# Patient Record
Sex: Male | Born: 1945 | Race: White | Hispanic: No | Smoking: Light tobacco smoker
Health system: Southern US, Community
[De-identification: ages and names within clinical notes are randomized; demographics above are authoritative.]

## PROBLEM LIST (undated history)

## (undated) DIAGNOSIS — N400 Enlarged prostate without lower urinary tract symptoms: Secondary | ICD-10-CM

## (undated) DIAGNOSIS — H5440 Blindness, one eye, unspecified eye: Secondary | ICD-10-CM

## (undated) DIAGNOSIS — E1165 Type 2 diabetes mellitus with hyperglycemia: Secondary | ICD-10-CM

## (undated) DIAGNOSIS — Z720 Tobacco use: Secondary | ICD-10-CM

## (undated) DIAGNOSIS — I10 Essential (primary) hypertension: Secondary | ICD-10-CM

## (undated) DIAGNOSIS — E119 Type 2 diabetes mellitus without complications: Secondary | ICD-10-CM

## (undated) DIAGNOSIS — I252 Old myocardial infarction: Secondary | ICD-10-CM

## (undated) DIAGNOSIS — E042 Nontoxic multinodular goiter: Secondary | ICD-10-CM

## (undated) HISTORY — DX: Tobacco use: Z72.0

## (undated) HISTORY — DX: Type 2 diabetes mellitus without complications: E11.9

## (undated) HISTORY — DX: Type 2 diabetes mellitus with hyperglycemia: E11.65

## (undated) HISTORY — PX: CORONARY ANGIOPLASTY WITH STENT PLACEMENT: SHX49

## (undated) HISTORY — DX: Nontoxic multinodular goiter: E04.2

## (undated) HISTORY — DX: Blindness, one eye, unspecified eye: H54.40

## (undated) HISTORY — DX: Essential (primary) hypertension: I10

## (undated) HISTORY — DX: Old myocardial infarction: I25.2

## (undated) HISTORY — DX: Benign prostatic hyperplasia without lower urinary tract symptoms: N40.0

---

## 2012-01-13 DIAGNOSIS — R972 Elevated prostate specific antigen [PSA]: Secondary | ICD-10-CM | POA: Diagnosis not present

## 2012-01-20 DIAGNOSIS — R972 Elevated prostate specific antigen [PSA]: Secondary | ICD-10-CM | POA: Diagnosis not present

## 2012-01-22 DIAGNOSIS — N400 Enlarged prostate without lower urinary tract symptoms: Secondary | ICD-10-CM | POA: Diagnosis not present

## 2012-01-22 DIAGNOSIS — R972 Elevated prostate specific antigen [PSA]: Secondary | ICD-10-CM | POA: Diagnosis not present

## 2012-02-03 DIAGNOSIS — R972 Elevated prostate specific antigen [PSA]: Secondary | ICD-10-CM | POA: Diagnosis not present

## 2012-02-03 DIAGNOSIS — N429 Disorder of prostate, unspecified: Secondary | ICD-10-CM | POA: Diagnosis not present

## 2012-02-05 DIAGNOSIS — H5200 Hypermetropia, unspecified eye: Secondary | ICD-10-CM | POA: Diagnosis not present

## 2012-02-05 DIAGNOSIS — E119 Type 2 diabetes mellitus without complications: Secondary | ICD-10-CM | POA: Diagnosis not present

## 2012-02-05 DIAGNOSIS — H04129 Dry eye syndrome of unspecified lacrimal gland: Secondary | ICD-10-CM | POA: Diagnosis not present

## 2012-02-05 DIAGNOSIS — H26139 Total traumatic cataract, unspecified eye: Secondary | ICD-10-CM | POA: Diagnosis not present

## 2012-02-26 DIAGNOSIS — E119 Type 2 diabetes mellitus without complications: Secondary | ICD-10-CM | POA: Diagnosis not present

## 2012-02-26 DIAGNOSIS — E559 Vitamin D deficiency, unspecified: Secondary | ICD-10-CM | POA: Diagnosis not present

## 2012-03-03 DIAGNOSIS — E119 Type 2 diabetes mellitus without complications: Secondary | ICD-10-CM | POA: Diagnosis not present

## 2012-03-03 DIAGNOSIS — R972 Elevated prostate specific antigen [PSA]: Secondary | ICD-10-CM | POA: Diagnosis not present

## 2012-03-03 DIAGNOSIS — G609 Hereditary and idiopathic neuropathy, unspecified: Secondary | ICD-10-CM | POA: Diagnosis not present

## 2012-04-28 DIAGNOSIS — R972 Elevated prostate specific antigen [PSA]: Secondary | ICD-10-CM | POA: Diagnosis not present

## 2012-05-05 DIAGNOSIS — R972 Elevated prostate specific antigen [PSA]: Secondary | ICD-10-CM | POA: Diagnosis not present

## 2012-07-29 DIAGNOSIS — R972 Elevated prostate specific antigen [PSA]: Secondary | ICD-10-CM | POA: Diagnosis not present

## 2012-08-25 DIAGNOSIS — R972 Elevated prostate specific antigen [PSA]: Secondary | ICD-10-CM | POA: Diagnosis not present

## 2012-09-29 DIAGNOSIS — Z23 Encounter for immunization: Secondary | ICD-10-CM | POA: Diagnosis not present

## 2013-01-12 DIAGNOSIS — Z79899 Other long term (current) drug therapy: Secondary | ICD-10-CM | POA: Diagnosis not present

## 2013-01-12 DIAGNOSIS — M25519 Pain in unspecified shoulder: Secondary | ICD-10-CM | POA: Diagnosis not present

## 2013-01-12 DIAGNOSIS — E119 Type 2 diabetes mellitus without complications: Secondary | ICD-10-CM | POA: Diagnosis not present

## 2013-06-30 DIAGNOSIS — L821 Other seborrheic keratosis: Secondary | ICD-10-CM | POA: Diagnosis not present

## 2013-07-12 DIAGNOSIS — G609 Hereditary and idiopathic neuropathy, unspecified: Secondary | ICD-10-CM | POA: Diagnosis not present

## 2013-07-12 DIAGNOSIS — Z79899 Other long term (current) drug therapy: Secondary | ICD-10-CM | POA: Diagnosis not present

## 2013-07-12 DIAGNOSIS — J309 Allergic rhinitis, unspecified: Secondary | ICD-10-CM | POA: Diagnosis not present

## 2013-07-12 DIAGNOSIS — IMO0001 Reserved for inherently not codable concepts without codable children: Secondary | ICD-10-CM | POA: Diagnosis not present

## 2013-07-12 DIAGNOSIS — M25519 Pain in unspecified shoulder: Secondary | ICD-10-CM | POA: Diagnosis not present

## 2013-08-16 DIAGNOSIS — R972 Elevated prostate specific antigen [PSA]: Secondary | ICD-10-CM | POA: Diagnosis not present

## 2013-08-20 DIAGNOSIS — H524 Presbyopia: Secondary | ICD-10-CM | POA: Diagnosis not present

## 2013-08-20 DIAGNOSIS — E119 Type 2 diabetes mellitus without complications: Secondary | ICD-10-CM | POA: Diagnosis not present

## 2013-08-20 DIAGNOSIS — H2510 Age-related nuclear cataract, unspecified eye: Secondary | ICD-10-CM | POA: Diagnosis not present

## 2013-08-20 DIAGNOSIS — H547 Unspecified visual loss: Secondary | ICD-10-CM | POA: Diagnosis not present

## 2013-10-04 DIAGNOSIS — Z23 Encounter for immunization: Secondary | ICD-10-CM | POA: Diagnosis not present

## 2013-12-06 DIAGNOSIS — R52 Pain, unspecified: Secondary | ICD-10-CM | POA: Diagnosis not present

## 2013-12-06 DIAGNOSIS — J019 Acute sinusitis, unspecified: Secondary | ICD-10-CM | POA: Diagnosis not present

## 2013-12-06 DIAGNOSIS — J209 Acute bronchitis, unspecified: Secondary | ICD-10-CM | POA: Diagnosis not present

## 2014-01-12 DIAGNOSIS — IMO0001 Reserved for inherently not codable concepts without codable children: Secondary | ICD-10-CM | POA: Diagnosis not present

## 2014-01-12 DIAGNOSIS — N400 Enlarged prostate without lower urinary tract symptoms: Secondary | ICD-10-CM | POA: Diagnosis not present

## 2014-01-12 DIAGNOSIS — L989 Disorder of the skin and subcutaneous tissue, unspecified: Secondary | ICD-10-CM | POA: Diagnosis not present

## 2014-01-12 DIAGNOSIS — Z79899 Other long term (current) drug therapy: Secondary | ICD-10-CM | POA: Diagnosis not present

## 2014-01-12 DIAGNOSIS — R238 Other skin changes: Secondary | ICD-10-CM | POA: Diagnosis not present

## 2014-02-07 DIAGNOSIS — R972 Elevated prostate specific antigen [PSA]: Secondary | ICD-10-CM | POA: Diagnosis not present

## 2014-02-14 DIAGNOSIS — N139 Obstructive and reflux uropathy, unspecified: Secondary | ICD-10-CM | POA: Diagnosis not present

## 2014-02-14 DIAGNOSIS — R972 Elevated prostate specific antigen [PSA]: Secondary | ICD-10-CM | POA: Diagnosis not present

## 2014-02-14 DIAGNOSIS — N401 Enlarged prostate with lower urinary tract symptoms: Secondary | ICD-10-CM | POA: Diagnosis not present

## 2014-08-10 DIAGNOSIS — R972 Elevated prostate specific antigen [PSA]: Secondary | ICD-10-CM | POA: Diagnosis not present

## 2014-08-18 DIAGNOSIS — Z23 Encounter for immunization: Secondary | ICD-10-CM | POA: Diagnosis not present

## 2014-08-18 DIAGNOSIS — R972 Elevated prostate specific antigen [PSA]: Secondary | ICD-10-CM | POA: Diagnosis not present

## 2014-08-18 DIAGNOSIS — R5383 Other fatigue: Secondary | ICD-10-CM | POA: Diagnosis not present

## 2014-08-18 DIAGNOSIS — Z136 Encounter for screening for cardiovascular disorders: Secondary | ICD-10-CM | POA: Diagnosis not present

## 2014-08-18 DIAGNOSIS — IMO0001 Reserved for inherently not codable concepts without codable children: Secondary | ICD-10-CM | POA: Diagnosis not present

## 2014-08-18 DIAGNOSIS — R5381 Other malaise: Secondary | ICD-10-CM | POA: Diagnosis not present

## 2014-08-18 DIAGNOSIS — Z0184 Encounter for antibody response examination: Secondary | ICD-10-CM | POA: Diagnosis not present

## 2014-08-23 ENCOUNTER — Other Ambulatory Visit: Payer: Self-pay | Admitting: Family Medicine

## 2014-08-23 DIAGNOSIS — E1165 Type 2 diabetes mellitus with hyperglycemia: Principal | ICD-10-CM

## 2014-08-23 DIAGNOSIS — IMO0001 Reserved for inherently not codable concepts without codable children: Secondary | ICD-10-CM

## 2014-08-23 DIAGNOSIS — Z139 Encounter for screening, unspecified: Secondary | ICD-10-CM

## 2014-09-02 ENCOUNTER — Ambulatory Visit
Admission: RE | Admit: 2014-09-02 | Discharge: 2014-09-02 | Disposition: A | Discharge status: Home / Self Care | Payer: Medicare Other | Source: Ambulatory Visit | Attending: Family Medicine | Admitting: Family Medicine

## 2014-09-02 ENCOUNTER — Encounter (INDEPENDENT_AMBULATORY_CARE_PROVIDER_SITE_OTHER): Payer: Self-pay

## 2014-09-02 DIAGNOSIS — IMO0001 Reserved for inherently not codable concepts without codable children: Secondary | ICD-10-CM

## 2014-09-02 DIAGNOSIS — Z136 Encounter for screening for cardiovascular disorders: Secondary | ICD-10-CM | POA: Diagnosis not present

## 2014-09-02 DIAGNOSIS — E119 Type 2 diabetes mellitus without complications: Secondary | ICD-10-CM | POA: Diagnosis not present

## 2014-09-02 DIAGNOSIS — E1165 Type 2 diabetes mellitus with hyperglycemia: Principal | ICD-10-CM

## 2014-09-02 DIAGNOSIS — I7090 Unspecified atherosclerosis: Secondary | ICD-10-CM | POA: Diagnosis not present

## 2014-09-02 DIAGNOSIS — Z139 Encounter for screening, unspecified: Secondary | ICD-10-CM

## 2014-09-02 DIAGNOSIS — Z87891 Personal history of nicotine dependence: Secondary | ICD-10-CM | POA: Diagnosis not present

## 2014-10-03 DIAGNOSIS — H2513 Age-related nuclear cataract, bilateral: Secondary | ICD-10-CM | POA: Diagnosis not present

## 2014-10-03 DIAGNOSIS — E119 Type 2 diabetes mellitus without complications: Secondary | ICD-10-CM | POA: Diagnosis not present

## 2015-02-13 DIAGNOSIS — E1165 Type 2 diabetes mellitus with hyperglycemia: Secondary | ICD-10-CM | POA: Diagnosis not present

## 2015-02-13 DIAGNOSIS — N400 Enlarged prostate without lower urinary tract symptoms: Secondary | ICD-10-CM | POA: Diagnosis not present

## 2015-02-20 DIAGNOSIS — R972 Elevated prostate specific antigen [PSA]: Secondary | ICD-10-CM | POA: Diagnosis not present

## 2015-02-20 DIAGNOSIS — N401 Enlarged prostate with lower urinary tract symptoms: Secondary | ICD-10-CM | POA: Diagnosis not present

## 2015-02-20 DIAGNOSIS — R351 Nocturia: Secondary | ICD-10-CM | POA: Diagnosis not present

## 2015-02-20 DIAGNOSIS — E1165 Type 2 diabetes mellitus with hyperglycemia: Secondary | ICD-10-CM | POA: Diagnosis not present

## 2015-02-20 DIAGNOSIS — Z23 Encounter for immunization: Secondary | ICD-10-CM | POA: Diagnosis not present

## 2015-02-20 DIAGNOSIS — N400 Enlarged prostate without lower urinary tract symptoms: Secondary | ICD-10-CM | POA: Diagnosis not present

## 2015-03-23 DIAGNOSIS — E1165 Type 2 diabetes mellitus with hyperglycemia: Secondary | ICD-10-CM | POA: Diagnosis not present

## 2015-04-03 ENCOUNTER — Ambulatory Visit: Payer: Medicare Other | Admitting: Endocrinology

## 2015-04-12 ENCOUNTER — Ambulatory Visit (INDEPENDENT_AMBULATORY_CARE_PROVIDER_SITE_OTHER): Payer: Medicare Other | Admitting: Endocrinology

## 2015-04-12 ENCOUNTER — Encounter: Payer: Self-pay | Admitting: Endocrinology

## 2015-04-12 ENCOUNTER — Other Ambulatory Visit: Payer: Self-pay | Admitting: *Deleted

## 2015-04-12 VITALS — BP 118/68 | HR 74 | Temp 98.1°F | Resp 14 | Ht 72.0 in | Wt 182.0 lb

## 2015-04-12 DIAGNOSIS — H5440 Blindness, one eye, unspecified eye: Secondary | ICD-10-CM

## 2015-04-12 DIAGNOSIS — E1165 Type 2 diabetes mellitus with hyperglycemia: Secondary | ICD-10-CM

## 2015-04-12 DIAGNOSIS — IMO0002 Reserved for concepts with insufficient information to code with codable children: Secondary | ICD-10-CM

## 2015-04-12 DIAGNOSIS — E042 Nontoxic multinodular goiter: Secondary | ICD-10-CM | POA: Diagnosis not present

## 2015-04-12 DIAGNOSIS — H5442 Blindness, left eye, normal vision right eye: Secondary | ICD-10-CM | POA: Diagnosis not present

## 2015-04-12 DIAGNOSIS — N400 Enlarged prostate without lower urinary tract symptoms: Secondary | ICD-10-CM | POA: Diagnosis not present

## 2015-04-12 HISTORY — DX: Nontoxic multinodular goiter: E04.2

## 2015-04-12 HISTORY — DX: Reserved for concepts with insufficient information to code with codable children: IMO0002

## 2015-04-12 HISTORY — DX: Blindness, one eye, unspecified eye: H54.40

## 2015-04-12 HISTORY — DX: Type 2 diabetes mellitus with hyperglycemia: E11.65

## 2015-04-12 MED ORDER — METFORMIN HCL ER 500 MG PO TB24: ORAL_TABLET | ORAL | Status: DC

## 2015-04-12 MED ORDER — DULAGLUTIDE 0.75 MG/0.5ML ~~LOC~~ SOAJ: SUBCUTANEOUS | Status: DC

## 2015-04-12 NOTE — Patient Instructions (Addendum)
Please check blood sugars at least half the time about 2 hours after any meal and 3 times per week on waking up.  Please bring blood sugar monitor to each visit.  Recommended blood sugar levels about 2 hours after meal is 140-180 and on waking up 83-254  Start TRULICITYwith the pen as shown once weekly on the same day of the week.  You may inject in the stomach, thigh or arm as indicated in the brochure given.  You will feel fullness of the stomach with starting the medication and should try to keep the portions at meals small.  You may experience nausea in the first few days which usually gets better over time   If any questions or concerns are present call the office or the  [place] at [phone]. Also visit [email or web address] website for more useful information  Glipizide 2x daily before meals

## 2015-04-12 NOTE — Progress Notes (Signed)
Patient ID: Alan Lopez, male   DOB: [birth year], 69 y.o.   MRN: [id number]           Reason for Appointment: Consultation for Type 2 Diabetes  Referring physician: Fulp  History of Present Illness:          Date of diagnosis of type 2 diabetes mellitus :  1999      Past history:  he had modestly increase blood sugars at diagnosis and was started on metformin Subsequently glipizide was added to improve his control. He thinks that his blood sugars have been mostly poorly controlled over the last several years Review of his A1c shows that it has ranged from 9-9.5 since 07/2013  Recent history:  He has been on glipizide and metformin for years without any adjustment of his dosage of glipizide Also until recently has not tried any other medications On his last visit with his PCP in March he was given a sample of Jardiance 10 mg which he took for about 5 weeks He thinks his sugars may have been a little better but since he does not monitor very often he cannot be sure if it helped   He is checking his blood sugars mostly in the mornings and these are consistently high Glucose levels in the lab this year have been 280 and 226 He is now here for further management  Oral hypoglycemic drugs the patient is taking are: metformin 500 mg, 2 tablets twice a day, glipizide 5 mg in the morning     Side effects from medications have been: Mild  diarrhea from metformin  Compliance with the medical regimen:fair  Hypoglycemia: never   Glucose monitoring:  done less than once a day         Glucometer:     Contour?  Blood Glucose readings by time of day and averages from recall  PREMEAL Breakfast Lunch Dinner Bedtime  Overall   Glucose range: 175-201  ?   ?  200+    Median:         Self-care: The diet that the patient has been following is: tries to limit high carbohydrate foods. eating out about 4 times a week andsometimes fast food.     Typical meal intake: Breakfast is recently a protein  drink.  Sometimes will have fried food Usually avoiding drinks with sugar               Dietician visit, most recent:never               Exercise: gardeningup to 3-4 hours daily   Weight history:178-215  Wt Readings from Last 3 Encounters:  04/12/15 182 lb (82.555 kg)    Glycemic control: last A1c 9.2 done on 02/13/15    No results found for: HGBA1C No results found for: GLUF, MICROALBUR, [place], CREATININE       Medication List       This list is accurate as of: 04/12/15  5:07 PM.  Always use your most recent med list.               cholecalciferol 1000 UNITS tablet  Commonly known as:  VITAMIN D  Take 1,000 Units by mouth daily.     Dulaglutide 0.75 MG/0.5ML Sopn  Commonly known as:  TRULICITY  Inject contents of one pen once a week     finasteride 5 MG tablet  Commonly known as:  PROSCAR  Take 5 mg by mouth daily.     glipiZIDE 5 MG  tablet  Commonly known as:  GLUCOTROL  Take 5 mg by mouth daily.     ibuprofen 200 MG tablet  Commonly known as:  ADVIL,MOTRIN  Take 200 mg by mouth every 6 (six) hours as needed.     metFORMIN 500 MG 24 hr tablet  Commonly known as:  GLUCOPHAGE XR  Take 2 tablets in the am and 2 tablets in the pm     multivitamin with minerals Tabs tablet  Take 1 tablet by mouth daily.        Allergies:  Allergies  Allergen Reactions  . Codeine Other (See Comments)    Patient doesn't like how it makes him feel    No past medical history on file.  No past surgical history on file.  Family History  Problem Relation Age of Onset  . Diabetes Mother   . Diabetes Brother   . Cancer Brother   . Heart disease Neg Hx     Social History:  reports that he has been smoking.  He has never used smokeless tobacco. His alcohol and drug histories are not on file.    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   ENT: no nasal congestion, difficulty swallowing, no hoarseness   Cardiovascular: no chest pain or tightness on exertion.  No leg swelling.  Hypertension:Not present  Respiratory: no cough/shortness of breath  Gastrointestinal: no constipation, diarrhea, nausea or abdominal pain  Musculoskeletal: no muscle/joint aches   Urological:  Has mild frequency of urination.  Has nocturia 3 times    Skin: no rash or infections  Neurological: no headaches.  Has history of numbness and some  tingling in feet but these symptoms are intermittent and mild    Psychiatric: no symptoms of depression  Endocrine: No unusual fatigue.  Previous TSH normal in 2015, has no  history of thyroid disease   LABS:  No results found for any previous visit.  Physical Examination:  BP 118/68 mmHg  Pulse 74  Temp(Src) 98.1 F (36.7 C)  Resp 14  Ht 6' (1.829 m)  Wt 182 lb (82.555 kg)  BMI 24.68 kg/m2  SpO2 95%  GENERAL:  well-built and nourished, nonobese    HEENT:         Eye exam shows dense left lenticular opacity.  right eye appears normal.  Fundus exam shows no retinopathy.  Oral exam shows normal mucosa .  NECK:         General:  Neck exam shows no lymphadenopathy. Carotids are normal to palpation and no bruit heard.  Thyroid is palpable about twice normal on the right side, firm and nodular.  About a 2 cm nodule felt on swallowing on the left side laterally LUNGS:         Chest is symmetrical. Lungs are clear to auscultation.Marland Kitchen   HEART:         Heart sounds:  S1 and S2 are normal. No murmurs or clicks heard., no S3 or S4.   ABDOMEN:   There is no distention present. Liver and spleen are not palpable. No other mass or tenderness present.  EXTREMITIES:     There is no edema. No skin lesions  present.Marland Kitchen  NEUROLOGICAL:   Vibration sense is moderately reduced in toes. Ankle jerks are absent bilaterally.          Diabetic foot exam shows normal monofilament sensation in the toes and plantar surfaces, no skin lesions or ulcers on the feet and normal pedal pulses MUSCULOSKELETAL:  There is no swelling or deformity of the peripheral joints. Spine is normal to inspection.   SKIN:       No rash or lesions of concern.        ASSESSMENT:  Diabetes type 2, uncontrolled  He is not significantly obese but he has persistently high blood sugars for the last few years with A1c consistently over 9% He is taking only 5 mg of glipizide a day with 2 g of metformin He does not think he had significant benefit from taking Jardiance recently Does need to improve his A1c by at least 2% and will need a relatively potent drug Also discussed that he probably has some insulin deficiency and will sooner orlater require insulin especially since he is nonobese  For now he probably will benefit from trial of a GLP-1 drug Discussed with the patient the nature of GLP-1 drugs, the action on various organ systems, how they benefit blood glucose control, as well as the benefit of weight loss and  increase satiety . Explained possible side effects especially nausea and vomiting; discussed safety information in package insert. Demonstrated the medication injection device and injection technique to the patient. Discussed injection sites and titration of Trulicity starting with 0.75 mg once a week for 2 weeks. 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  Previous TSH has been normal very discussed with patient  No history of hyperlipidemia or hypertension  PLAN:   He was advised to start checking his blood sugars more consistently.  Discussed needing to check readings couple of  hours after meals more frequently than  in the morning  He will bring his monitor for download on each visit  Showed him how to use the Trulicity injection as above and he will start with 0.75 mg until his next visit.  Explained possible side effects and how to prevent these  Increase glipizide to twice a day for now and on the next prescription changed to glipizide ER  Change metformin to metformin ER to avoid diarrhea  Thyroid ultrasound  Patient Instructions  Please check blood sugars at least half the time about 2 hours after any meal and 3 times per week on waking up.  Please bring blood sugar monitor to each visit.  Recommended blood sugar levels about 2 hours after meal is 140-180 and on waking up 50-093  Start TRULICITYwith the pen as shown once weekly on the same day of the week.  You may inject in the stomach, thigh or arm as indicated in the brochure given.  You will feel fullness of the stomach with starting the medication and should try to keep the portions at meals small.  You may experience nausea in the first few days which usually gets better over time   If any questions or concerns are present call the office or the  [facility] at [phone]. Also visit [email or web address] website for more useful information  Glipizide 2x daily before meals  Zackari Ruane 04/12/2015, 5:07 PM   Note: This office note was prepared with Dragon voice recognition system technology. Any transcriptional errors that result from this process are unintentional.

## 2015-04-19 ENCOUNTER — Encounter: Payer: Self-pay | Admitting: *Deleted

## 2015-04-19 ENCOUNTER — Ambulatory Visit
Admission: RE | Admit: 2015-04-19 | Discharge: 2015-04-19 | Disposition: A | Discharge status: Home / Self Care | Payer: Medicare Other | Source: Ambulatory Visit | Attending: Endocrinology | Admitting: Endocrinology

## 2015-04-19 DIAGNOSIS — E042 Nontoxic multinodular goiter: Secondary | ICD-10-CM | POA: Diagnosis not present

## 2015-05-01 ENCOUNTER — Other Ambulatory Visit (INDEPENDENT_AMBULATORY_CARE_PROVIDER_SITE_OTHER): Payer: Medicare Other

## 2015-05-01 DIAGNOSIS — E1165 Type 2 diabetes mellitus with hyperglycemia: Secondary | ICD-10-CM

## 2015-05-01 DIAGNOSIS — IMO0002 Reserved for concepts with insufficient information to code with codable children: Secondary | ICD-10-CM

## 2015-05-01 DIAGNOSIS — M109 Gout, unspecified: Secondary | ICD-10-CM | POA: Diagnosis not present

## 2015-05-01 DIAGNOSIS — M25569 Pain in unspecified knee: Secondary | ICD-10-CM | POA: Diagnosis not present

## 2015-05-01 LAB — BASIC METABOLIC PANEL
BUN: 15 mg/dL (ref 6–23)
CO2: 23 meq/L (ref 19–32)
Calcium: 9.3 mg/dL (ref 8.4–10.5)
Chloride: 106 mEq/L (ref 96–112)
Creatinine, Ser: 0.78 mg/dL (ref 0.40–1.50)
GFR: 104.52 mL/min (ref >60.00)
GLUCOSE: 123 mg/dL — AB (ref 70–99)
Potassium: 4.3 mEq/L (ref 3.5–5.1)
Sodium: 137 mEq/L (ref 135–145)

## 2015-05-02 LAB — FRUCTOSAMINE: FRUCTOSAMINE: 244 umol/L (ref 0–285)

## 2015-05-04 ENCOUNTER — Encounter: Payer: Self-pay | Admitting: Endocrinology

## 2015-05-04 ENCOUNTER — Ambulatory Visit (INDEPENDENT_AMBULATORY_CARE_PROVIDER_SITE_OTHER): Payer: Medicare Other | Admitting: Endocrinology

## 2015-05-04 VITALS — BP 128/82 | HR 83 | Temp 98.0°F | Resp 16 | Ht 72.0 in | Wt 184.8 lb

## 2015-05-04 DIAGNOSIS — E1165 Type 2 diabetes mellitus with hyperglycemia: Secondary | ICD-10-CM

## 2015-05-04 DIAGNOSIS — IMO0002 Reserved for concepts with insufficient information to code with codable children: Secondary | ICD-10-CM

## 2015-05-04 MED ORDER — GLIPIZIDE ER 2.5 MG PO TB24: 2.5000 mg | ORAL_TABLET | Freq: Every day | ORAL | Status: DC

## 2015-05-04 NOTE — Progress Notes (Signed)
Patient ID: Alan Lopez, male   DOB: 04/26/1946, 69 y.o.   MRN: [id number]           Reason for Appointment: Follow-up for Type 2 Diabetes  Referring physician: Fulp  History of Present Illness:          Date of diagnosis of type 2 diabetes mellitus :  1999      Past history:  he had modestly increase blood sugars at diagnosis and was started on metformin Subsequently glipizide was added to improve his control. He thinks that his blood sugars have been mostly poorly controlled over the last several years He had been on glipizide and metformin for years without any adjustment of his dosage of glipizide  Review of his A1c shows that it has ranged from 9-9.5 since 07/2013 On his  visit with his PCP in March he was given a sample of Jardiance 10 mg without significant improvement  Recent history:   He was started on Trulicity 0.25 mg weekly on his initial consultation in 5/16 when his blood sugars were mostly around 200 He has been monitoring his blood sugars but only in the morning before breakfast His blood sugars appeared to be improved right after he started the injection and his fasting blood sugars averaging consistently near normal He is taking glipizide before breakfast and also supper but will sometimes miss his evening dose He was also changed from metformin to metformin ER which she is tolerating better No hypoglycemia with recent changes  Oral hypoglycemic drugs the patient is taking are: metformin 500 mg, 2 tablets twice a day, glipizide 5 mg bid    Side effects from medications have been: Mild  diarrhea from regular metformin  Compliance with the medical regimen: fair  Hypoglycemia: never   Glucose monitoring:  done less than once a day         Glucometer:     Contour  Blood Glucose readings by time of day and averages from download show range of 84-139 with average 113 His monitor does have incorrect date and time which was fixed today   Self-care: The diet that  the patient has been following is: tries to limit high carbohydrate foods. eating out about 4 times a week andsometimes fast food.     Typical meal intake: Breakfast is recently a protein drink.  Sometimes will have fried food Usually avoiding drinks with sugar               Dietician visit, most recent:never, has been scheduled               Exercise: gardening up to 3-4 hours daily   Weight history:178-215  Wt Readings from Last 3 Encounters:  05/04/15 184 lb 12.8 oz (83.825 kg)  04/12/15 182 lb (82.555 kg)    Glycemic control: last A1c 9.2 done on 02/13/15    No results found for: HGBA1C Lab Results  Component Value Date   CREATININE 0.78 05/01/2015         Medication List       This list is accurate as of: 05/04/15  2:56 PM.  Always use your most recent med list.               cholecalciferol 1000 UNITS tablet  Commonly known as:  VITAMIN D  Take 1,000 Units by mouth daily.     Dulaglutide 0.75 MG/0.5ML Sopn  Commonly known as:  TRULICITY  Inject contents of one pen once a week  finasteride 5 MG tablet  Commonly known as:  PROSCAR  Take 5 mg by mouth daily.     glipiZIDE 2.5 MG 24 hr tablet  Commonly known as:  GLUCOTROL XL  Take 1 tablet (2.5 mg total) by mouth daily with breakfast.     ibuprofen 200 MG tablet  Commonly known as:  ADVIL,MOTRIN  Take 200 mg by mouth every 6 (six) hours as needed.     indomethacin 50 MG capsule  Commonly known as:  INDOCIN     metFORMIN 500 MG 24 hr tablet  Commonly known as:  GLUCOPHAGE XR  Take 2 tablets in the am and 2 tablets in the pm     multivitamin with minerals Tabs tablet  Take 1 tablet by mouth daily.        Allergies:  Allergies  Allergen Reactions  . Codeine Other (See Comments)    Patient doesn't like how it makes him feel    No past medical history on file.  No past surgical history on file.  Family History  Problem Relation Age of Onset  . Diabetes Mother   . Diabetes Brother   .  Cancer Brother   . Heart disease Neg Hx     Social History:  reports that he has been smoking.  He has never used smokeless tobacco. His alcohol and drug histories are not on file.    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    Endocrine: He was found to have a 2 cm nodule on his left thyroid on his exam but ultrasound showed multinodular goiter with mostly small nodules and the largest nodule on the left side of 1.4 cm has the appearance of a pseudo- nodule Previous TSH normal in 2015   LABS:  Lab on 05/01/2015  Component Date Value Ref Range Status  . Sodium 05/01/2015 137  135 - 145 mEq/L Final  . Potassium 05/01/2015 4.3  3.5 - 5.1 mEq/L Final  . Chloride 05/01/2015 106  96 - 112 mEq/L Final  . CO2 05/01/2015 23  19 - 32 mEq/L Final  . Glucose, Bld 05/01/2015 123* 70 - 99 mg/dL Final  . BUN 05/01/2015 15  6 - 23 mg/dL Final  . Creatinine, Ser 05/01/2015 0.78  0.40 - 1.50 mg/dL Final  . Calcium 05/01/2015 9.3  8.4 - 10.5 mg/dL Final  . GFR 05/01/2015 104.52  >60.00 mL/min Final  . Fructosamine 05/01/2015 244  0 - 285 umol/L Final   Comment: Published reference interval for apparently healthy subjects between age 33 and 9 is 13 - 285 umol/L and in a poorly controlled diabetic population is 228 - 563 umol/L with a mean of 396 umol/L.     Physical Examination:  BP 128/82 mmHg  Pulse 83  Temp(Src) 98 F (36.7 C)  Resp 16  Ht 6' (1.829 m)  Wt 184 lb 12.8 oz (83.825 kg)  BMI 25.06 kg/m2  SpO2 95%     ASSESSMENT:  Diabetes type 2, uncontrolled  He is having much better blood sugars with starting only a low dose of 0.75 mg weekly of the Trulicity about 3 weeks ago He has not had any difficulties  with this injection and  no nausea Fructosamine above indicates excellent control with the level of 244 His blood sugars are near-normal in the morning However has not done any postprandial readings  MULTINODULAR goiter: This is insignificant with mostly subcentimeter nodules and the left-sided nodule is giving the appearance of a pseudo-nodule not requiring biopsy   PLAN:   He was advised to start checking his blood sugars more consistently after meals and not tender morning daily.  Change glipizide to glipizide ER 2.5 mg daily and discussed that the dose may be adjusted further if blood sugars are out of range  Continue metformin ER  Continue 1.65 mg of Trulicity weekly  V3Z in 2 months  Patient Instructions  Check blood sugars on waking up ..  .. times a week Also check blood sugars about 2 hours after a meal and do this after different meals by rotation  Recommended blood sugar levels on waking up is 90-130 and about 2 hours after meal is 140-180 Please bring blood sugar monitor to each visit.  Glipizide er 2.5mg  daily instead of glipizide    Falicia Lizotte 05/04/2015, 2:56 PM   Note: This office note was prepared with Estate agent. Any transcriptional errors that result from this process are unintentional.

## 2015-05-04 NOTE — Patient Instructions (Signed)
Check blood sugars on waking up ..  .. times a week Also check blood sugars about 2 hours after a meal and do this after different meals by rotation  Recommended blood sugar levels on waking up is 90-130 and about 2 hours after meal is 140-180 Please bring blood sugar monitor to each visit.  Glipizide er 2.5mg  daily instead of glipizide

## 2015-05-16 ENCOUNTER — Encounter: Payer: Medicare Other | Attending: Family Medicine | Admitting: Dietician

## 2015-05-16 VITALS — Ht 72.0 in | Wt 184.0 lb

## 2015-05-16 DIAGNOSIS — IMO0002 Reserved for concepts with insufficient information to code with codable children: Secondary | ICD-10-CM

## 2015-05-16 DIAGNOSIS — E1165 Type 2 diabetes mellitus with hyperglycemia: Secondary | ICD-10-CM | POA: Insufficient documentation

## 2015-05-16 DIAGNOSIS — Z713 Dietary counseling and surveillance: Secondary | ICD-10-CM | POA: Insufficient documentation

## 2015-05-16 NOTE — Patient Instructions (Signed)
Continue to stay as active as possible.  Get some form of exercise every day. Be mindful about your fat intake and portion sizes. Aim for 4 Carb Choices per meal (60 grams) +/- 1 either way  Aim for 0-2 Carbs per snack if hungry  Include protein in moderation with your meals and snacks Consider reading food labels for Total Carbohydrate and Fat Grams of foods

## 2015-05-16 NOTE — Progress Notes (Signed)
  Medical Nutrition Therapy:  Appt start time: 8889 end time:  1694.   Assessment:  Primary concerns today: Patient is here alone.  He is here to try to improve his blood sugar control.  Hx includes type 2 diabetes since the late 80's.  Most recently being followed by Dr. Dwyane Dee and has started on Trulicity.  AM blood sugars have improved since and are currently 95-115.  Patient states that he does not check blood sugar consistently as he forgets.  Fructosamine 244 05/01/15.  HgbA1C 9.2%  (02/13/15) and has been 9-9.5% since 07/2013.    He lives with his wife.  Patient does the shopping and cooking.  Wife has ovarian cancer and is currently going through chemo.  Retired.   Preferred Learning Style:   No preference indicated   Learning Readiness:   Ready  MEDICATIONS: see list to include:     DIETARY INTAKE: Fried chicken out to eat but not at home.  Higher fat foods in general when he eats out.  Eats out currently 1-2 times per week.    24-hr recall:  B (7:30-8 AM): Protein shake with 30 grams protein and low carb OR sausage sandwich on [employer] with mustard this am and coffee with 2% milk, 1/2 cup 50/50 OJ with splash of regular soda occasionally. Snk ( AM): none  L (12-2 PM): BLT's or hotdog on bun with mustard or hamburger or Leftovers, occasionally 1 piece of dark chocolate, nuts, 1 piece candy or 1 oreo thin. Snk ( PM): seldom D ( PM): grilled meat (chicken, pork chop) or fish, starch, vegetables OR thin crust pizza OR cereal or pancakes with SF syrup on Sunday Snk ( PM): none Beverages: Diet soda, coffee with 2% milk, 50/50 OJ with splash of regular soda occasionally, water, G2, unsweetened tea with truvia or splenda  Usual physical activity: 3-4 hours gardening 4-5 days per week.  Housekeeping.  Estimated energy needs: 2000 calories 225 g carbohydrates 150 g protein 56 g fat  Progress Towards Goal(s):  In progress.   Nutritional Diagnosis:  NB-1.1 Food and nutrition-related  knowledge deficit As related to balance of carbohydrate, protein, and fat.  As evidenced by diet hx and patient report.    Intervention:  Nutrition counseling and diabetes education initiated. Discussed Carb Counting by food group as method of portion control, reading food labels, and benefits of increased activity. Also discussed basic physiology of Diabetes, target BG ranges pre and post meals, and A1c.   Continue to stay as active as possible.  Get some form of exercise every day.-  Patient discussed joining the [place] program to exercise when he is unable to exercise outside. Be mindful about your fat intake and portion sizes.- patient verbalized this as a needed area to change Aim for 4 Carb Choices per meal (60 grams) +/- 1 either way  Aim for 0-2 Carbs per snack if hungry  Include protein in moderation with your meals and snacks Consider reading food labels for Total Carbohydrate and Fat Grams of foods  Teaching Method Utilized:  Visual Auditory Hands on  Handouts given during visit include:  Meal plan card  Label reading  HgbA1C sheet  Barriers to learning/adherence to lifestyle change: none  Demonstrated degree of understanding via:  Teach Back   Monitoring/Evaluation:  Dietary intake, exercise, label reading, and body weight prn.

## 2015-06-29 ENCOUNTER — Other Ambulatory Visit (INDEPENDENT_AMBULATORY_CARE_PROVIDER_SITE_OTHER): Payer: Medicare Other

## 2015-06-29 DIAGNOSIS — E1165 Type 2 diabetes mellitus with hyperglycemia: Secondary | ICD-10-CM | POA: Diagnosis not present

## 2015-06-29 DIAGNOSIS — IMO0002 Reserved for concepts with insufficient information to code with codable children: Secondary | ICD-10-CM

## 2015-06-29 LAB — BASIC METABOLIC PANEL
BUN: 14 mg/dL (ref 6–23)
CO2: 25 mEq/L (ref 19–32)
Calcium: 9.4 mg/dL (ref 8.4–10.5)
Chloride: 106 mEq/L (ref 96–112)
Creatinine, Ser: 0.86 mg/dL (ref 0.40–1.50)
GFR: 93.34 mL/min (ref >60.00)
Glucose, Bld: 122 mg/dL — ABNORMAL HIGH (ref 70–99)
Potassium: 4.3 mEq/L (ref 3.5–5.1)
Sodium: 138 mEq/L (ref 135–145)

## 2015-06-29 LAB — HEMOGLOBIN A1C: Hgb A1c MFr Bld: 7.1 % — ABNORMAL HIGH (ref 4.6–6.5)

## 2015-07-04 ENCOUNTER — Encounter: Payer: Self-pay | Admitting: Endocrinology

## 2015-07-04 ENCOUNTER — Ambulatory Visit (INDEPENDENT_AMBULATORY_CARE_PROVIDER_SITE_OTHER): Payer: Medicare Other | Admitting: Endocrinology

## 2015-07-04 ENCOUNTER — Other Ambulatory Visit: Payer: Self-pay

## 2015-07-04 VITALS — BP 124/72 | HR 74 | Temp 97.8°F | Resp 16 | Ht 72.0 in | Wt 185.4 lb

## 2015-07-04 DIAGNOSIS — E1165 Type 2 diabetes mellitus with hyperglycemia: Secondary | ICD-10-CM

## 2015-07-04 DIAGNOSIS — IMO0002 Reserved for concepts with insufficient information to code with codable children: Secondary | ICD-10-CM

## 2015-07-04 MED ORDER — GLIPIZIDE ER 2.5 MG PO TB24: 2.5000 mg | ORAL_TABLET | Freq: Every day | ORAL | Status: DC

## 2015-07-04 NOTE — Patient Instructions (Addendum)
Check on cost of OSENI or [place]  instead of Trulicity  Walk 3x per week  Check blood sugars on waking up .Marland Kitchen 2-3 .Marland Kitchen times a week Also check blood sugars about 2 hours after a meal and do this after different meals by rotation  Recommended blood sugar levels on waking up is 90-130 and about 2 hours after meal is 140-160 Please bring blood sugar monitor to each visit.

## 2015-07-04 NOTE — Progress Notes (Signed)
Patient ID: Alan Lopez, male   DOB: May 19, 1946, 69 y.o.   MRN: [id number]           Reason for Appointment: Follow-up for Type 2 Diabetes  Referring physician: Fulp  History of Present Illness:          Date of diagnosis of type 2 diabetes mellitus :  1999      Past history:  He had modestly increase blood sugars at diagnosis and was started on metformin Subsequently glipizide was added to improve his control. He thinks that his blood sugars have been mostly poorly controlled over the last several years He had been on glipizide and metformin for years without any adjustment of his dosage of glipizide  Review of his A1c shows that it has ranged from 9-9.5 since 07/2013 On his  visit with his PCP in March he was given a sample of Jardiance 10 mg without significant improvement  Recent history:   He was started on Trulicity 7.35 mg weekly on his initial consultation in 5/16 when his blood sugars were mostly around 200 He has been monitoring his blood sugars but again only in the morning before breakfast He was also changed from metformin to metformin ER which he is tolerating better Also glipizide was changed to glipizide ER 2.5 mg for convenience, better compliance and consistent control No hypoglycemia reported during the day A1c has improved significantly  Oral hypoglycemic drugs the patient is taking are: metformin 500 mg, 2 tablets twice a day, glipizide 5 mg bid    Side effects from medications have been: Mild  diarrhea from regular metformin  Compliance with the medical regimen: fair  Hypoglycemia: never   Glucose monitoring:  done once a day usually         Glucometer:     Contour  Blood Glucose readings by monitor download shows fasting range 111-158 with average 131   Self-care: The diet that the patient has been following is: tries to limit high carbohydrate foods. eating out about 4 times a week andsometimes fast food.     Typical meal intake: Breakfast is  recently a protein drink.  Sometimes will have fried food Usually avoiding drinks with sugar               Dietician visit, most recent:never, has been scheduled               Exercise: gardening up to 3-4 hours daily, less recently  Weight history: Maximum weight 215 previously  Wt Readings from Last 3 Encounters:  07/04/15 185 lb 6.4 oz (84.097 kg)  05/16/15 184 lb (83.462 kg)  05/04/15 184 lb 12.8 oz (83.825 kg)    Glycemic control: last A1c 9.2 done on 02/13/15 Microalbumin ratio normal in 3/16 from PCP    Lab Results  Component Value Date   HGBA1C 7.1* 06/29/2015   Lab Results  Component Value Date   CREATININE 0.86 06/29/2015         Medication List       This list is accurate as of: 07/04/15 10:02 AM.  Always use your most recent med list.               calcium carbonate 500 MG chewable tablet  Commonly known as:  TUMS - dosed in mg elemental calcium  Chew 1 tablet by mouth daily.     cholecalciferol 1000 UNITS tablet  Commonly known as:  VITAMIN D  Take 1,000 Units by mouth daily.  Dulaglutide 0.75 MG/0.5ML Sopn  Commonly known as:  TRULICITY  Inject contents of one pen once a week     finasteride 5 MG tablet  Commonly known as:  PROSCAR  Take 5 mg by mouth daily.     glipiZIDE 2.5 MG 24 hr tablet  Commonly known as:  GLUCOTROL XL  Take 1 tablet (2.5 mg total) by mouth daily with breakfast.     ibuprofen 200 MG tablet  Commonly known as:  ADVIL,MOTRIN  Take 200 mg by mouth every 6 (six) hours as needed.     indomethacin 50 MG capsule  Commonly known as:  INDOCIN     metFORMIN 500 MG 24 hr tablet  Commonly known as:  GLUCOPHAGE XR  Take 2 tablets in the am and 2 tablets in the pm     multivitamin with minerals Tabs tablet  Take 1 tablet by mouth daily.        Allergies:  Allergies  Allergen Reactions  . Codeine Other (See Comments)    Patient doesn't like how it makes him feel    No past medical history on file.  No past  surgical history on file.  Family History  Problem Relation Age of Onset  . Diabetes Mother   . Diabetes Brother   . Cancer Brother   . Heart disease Neg Hx     Social History:  reports that he has been smoking.  He has never used smokeless tobacco. His alcohol and drug histories are not on file.    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   Endocrine: He was found to have a 2 cm nodule on his left thyroid on his exam but ultrasound showed multinodular goiter with mostly small nodules and the largest nodule on the left side of 1.4 cm has the appearance of a pseudo- nodule Previous TSH normal in 2015   LABS:  Appointment on 06/29/2015  Component Date Value Ref Range Status  . Hgb A1c MFr Bld 06/29/2015 7.1* 4.6 - 6.5 % Final   Glycemic Control Guidelines for People with Diabetes:Non Diabetic:  <6%Goal of Therapy: <7%Additional Action Suggested:  >8%   . Sodium 06/29/2015 138  135 - 145 mEq/L Final  . Potassium 06/29/2015 4.3  3.5 - 5.1 mEq/L Final  . Chloride 06/29/2015 106  96 - 112 mEq/L Final  . CO2 06/29/2015 25  19 - 32 mEq/L Final  . Glucose, Bld 06/29/2015 122* 70 - 99 mg/dL Final  . BUN 06/29/2015 14  6 - 23 mg/dL Final  . Creatinine, Ser 06/29/2015 0.86  0.40 - 1.50 mg/dL Final  . Calcium 06/29/2015 9.4  8.4 - 10.5 mg/dL Final  . GFR 06/29/2015 93.34  >60.00 mL/min Final    Physical Examination:  BP 148/88 mmHg  Pulse 74  Temp(Src) 97.8 F (36.6 C)  Resp 16  Ht 6' (1.829 m)  Wt 185 lb 6.4 oz (84.097 kg)  BMI 25.14 kg/m2  SpO2 95%     ASSESSMENT:  Diabetes type 2, uncontrolled  He is having  better blood sugars with low dose of 0.75 mg weekly of the Trulicity  He is also on metformin and low-dose  glipizide  ER A1c above indicates excellent control with the level of 7.1 compared to levels around 9% before His blood sugars are fairly good overall in the morning However has not done any postprandial readings Also not he wants to stop his Trulicity because of cost  PLAN:   He was advised to start checking his blood sugars more consistently after meals and not just in morning daily.  Continue metformin ER  Continue 7.67 mg of Trulicity weekly with his current prescription but try to change to Oseni if cost is not excessive  Follow-up in 2 months  Patient Instructions  Check on cost of OSENI or Nesina  instead of Trulicity  Walk 3x per week  Check blood sugars on waking up .Marland Kitchen 2-3 .Marland Kitchen times a week Also check blood sugars about 2 hours after a meal and do this after different meals by rotation  Recommended blood sugar levels on waking up is 90-130 and about 2 hours after meal is 140-160 Please bring blood sugar monitor to each visit.     Verdis Bassette 07/04/2015, 10:02 AM   Note: This office note was prepared with Dragon voice recognition system technology. Any transcriptional errors that result from this process are unintentional.

## 2015-07-10 ENCOUNTER — Other Ambulatory Visit: Payer: Self-pay | Admitting: Endocrinology

## 2015-08-16 DIAGNOSIS — R972 Elevated prostate specific antigen [PSA]: Secondary | ICD-10-CM | POA: Diagnosis not present

## 2015-08-17 DIAGNOSIS — Z23 Encounter for immunization: Secondary | ICD-10-CM | POA: Diagnosis not present

## 2015-08-23 DIAGNOSIS — R972 Elevated prostate specific antigen [PSA]: Secondary | ICD-10-CM | POA: Diagnosis not present

## 2015-09-05 ENCOUNTER — Ambulatory Visit (INDEPENDENT_AMBULATORY_CARE_PROVIDER_SITE_OTHER): Payer: Medicare Other | Admitting: Endocrinology

## 2015-09-05 ENCOUNTER — Encounter: Payer: Self-pay | Admitting: Endocrinology

## 2015-09-05 VITALS — BP 132/78 | HR 78 | Temp 98.0°F | Resp 16 | Ht 72.0 in | Wt 184.8 lb

## 2015-09-05 DIAGNOSIS — IMO0002 Reserved for concepts with insufficient information to code with codable children: Secondary | ICD-10-CM

## 2015-09-05 DIAGNOSIS — E1165 Type 2 diabetes mellitus with hyperglycemia: Secondary | ICD-10-CM | POA: Diagnosis not present

## 2015-09-05 MED ORDER — PIOGLITAZONE HCL 15 MG PO TABS: 15.0000 mg | ORAL_TABLET | Freq: Every day | ORAL | Status: DC

## 2015-09-05 MED ORDER — ALOGLIPTIN BENZOATE 25 MG PO TABS: ORAL_TABLET | ORAL | Status: DC

## 2015-09-05 NOTE — Progress Notes (Signed)
Patient ID: Alan Lopez, male   DOB: 04/22/1946, 69 y.o.   MRN: [id number]           Reason for Appointment: Follow-up for Type 2 Diabetes  Referring physician: Fulp  History of Present Illness:          Date of diagnosis of type 2 diabetes mellitus :  1999      Past history:  He had modestly increase blood sugars at diagnosis and was started on metformin Subsequently glipizide was added to improve his control. He thinks that his blood sugars have been mostly poorly controlled over the last several years He had been on glipizide and metformin for years without any adjustment of his dosage of glipizide  Review of his A1c shows that it has ranged from 9-9.5 since 07/2013 On his  visit with his PCP in March he was given a sample of Jardiance 10 mg without significant improvement  Recent history:   He has continued on Trulicity 2.12 mg weekly that was started on his initial consultation in 5/16 when his blood sugars were mostly around 200 He was also changed from metformin to metformin ER which he is tolerating better Also glipizide was changed to glipizide ER 2.5 mg for convenience, better compliance and consistent control A1c has improved significantly as of 7/16 He has been monitoring his blood sugars regularly but again only in the morning before breakfast despite reminders to do readings after meals when able to review today He was seen in follow-up today to evaluate his current control and possibility of increasing the Trulicity as well as considering alternatives because of the high cost of this for him now  Oral hypoglycemic drugs the patient is taking are: metformin 500 mg, 2 tablets twice a day, glipizide 5 mg bid    Side effects from medications have been: Mild  diarrhea from regular metformin  Compliance with the medical regimen: fair  Hypoglycemia: never   Glucose monitoring:  done once a day usually         Glucometer:     Contour  Blood Glucose readings by monitor  download shows FASTING range 118-174 with average 142 After supper 133   Self-care: The diet that the patient has been following is: tries to limit high carbohydrate foods. eating out about 4 times a week andsometimes fast food.     Typical meal intake: Breakfast is recently a protein drink.  Sometimes will have fried food Usually avoiding drinks with sugar               Dietician visit, most recent:never, has been scheduled               Exercise: gardening up to 3-4 hours daily[facility]   Weight history: Maximum weight 215 previously  Wt Readings from Last 3 Encounters:  09/05/15 184 lb 12.8 oz (83.825 kg)  07/04/15 185 lb 6.4 oz (84.097 kg)  05/16/15 184 lb (83.462 kg)    Glycemic control: last A1c 9.2 done on 02/13/15 Microalbumin ratio normal in 3/16 from PCP    Lab Results  Component Value Date   HGBA1C 7.1* 06/29/2015   Lab Results  Component Value Date   CREATININE 0.86 06/29/2015         Medication List       This list is accurate as of: 09/05/15  9:24 AM.  Always use your most recent med list.  Alogliptin Benzoate 25 MG Tabs  1 daily     calcium carbonate 500 MG chewable tablet  Commonly known as:  TUMS - dosed in mg elemental calcium  Chew 1 tablet by mouth daily.     cholecalciferol 1000 UNITS tablet  Commonly known as:  VITAMIN D  Take 1,000 Units by mouth daily.     finasteride 5 MG tablet  Commonly known as:  PROSCAR  Take 5 mg by mouth daily.     glipiZIDE 2.5 MG 24 hr tablet  Commonly known as:  GLUCOTROL XL  Take 1 tablet (2.5 mg total) by mouth daily with breakfast.     ibuprofen 200 MG tablet  Commonly known as:  ADVIL,MOTRIN  Take 200 mg by mouth every 6 (six) hours as needed.     indomethacin 50 MG capsule  Commonly known as:  INDOCIN     metFORMIN 500 MG 24 hr tablet  Commonly known as:  GLUCOPHAGE-XR  TAKE 2 TABLETS BY MOUTH MORNING AND EVENING     multivitamin with minerals Tabs tablet  Take 1 tablet by  mouth daily.     pioglitazone 15 MG tablet  Commonly known as:  ACTOS  Take 1 tablet (15 mg total) by mouth daily.     TRULICITY 7.61 YW/7.3XT Sopn  Generic drug:  Dulaglutide  INJECT CONTENTS OF 1 PEN ONCE WEEKLY        Allergies:  Allergies  Allergen Reactions  . Codeine Other (See Comments)    Patient doesn't like how it makes him feel    No past medical history on file.  No past surgical history on file.  Family History  Problem Relation Age of Onset  . Diabetes Mother   . Diabetes Brother   . Cancer Brother   . Heart disease Neg Hx     Social History:  reports that he has been smoking.  He has never used smokeless tobacco. His alcohol and drug histories are not on file.    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   Endocrine: He was found to have a 2 cm nodule on his left thyroid on his exam but ultrasound showed multinodular goiter with mostly small nodules and the largest nodule on the left side of 1.4 cm has the appearance of a pseudo- nodule Previous TSH normal in 2015   LABS:  No visits with results within 1 Week(s) from this visit. Latest known visit with results is:  Appointment on 06/29/2015  Component Date Value Ref Range Status  . Hgb A1c MFr Bld 06/29/2015 7.1* 4.6 - 6.5 % Final   Glycemic Control Guidelines for People with Diabetes:Non Diabetic:  <6%Goal of Therapy: <7%Additional Action Suggested:  >8%   . Sodium 06/29/2015 138  135 - 145 mEq/L Final  . Potassium 06/29/2015 4.3  3.5 - 5.1 mEq/L Final  . Chloride 06/29/2015 106  96 - 112 mEq/L Final  . CO2 06/29/2015 25  19 - 32 mEq/L Final  . Glucose, Bld 06/29/2015 122* 70 - 99 mg/dL Final  . BUN 06/29/2015 14  6 - 23 mg/dL Final  . Creatinine,  Ser 06/29/2015 0.86  0.40 - 1.50 mg/dL  Final  . Calcium 06/29/2015 9.4  8.4 - 10.5 mg/dL Final  . GFR 06/29/2015 93.34  >60.00 mL/min Final    Physical Examination:  BP 132/78 mmHg  Pulse 78  Temp(Src) 98 F (36.7 C)  Resp 16  Ht 6' (1.829 m)  Wt 184 lb 12.8 oz (83.825 kg)  BMI 25.06 kg/m2  SpO2 93%     ASSESSMENT:  Diabetes type 2, uncontrolled  He is getting  fairly good fasting blood sugars with low dose of 0.75 mg weekly of the Trulicity although averaging slightly high at 140 recently  He is also on metformin and low-dose glipizide ER He is not consistent with monitoring after meals as directed and difficult to know of his postprandial control is adequate are not He thinks he is following a diet he had been advised on and also walking fairly regularly Currently he is very reluctant to continue the Trulicity because of very high cost    PLAN:   He was advised to start checking his blood sugars  consistently after meals and less often in the morning  Continue metformin ER 2000 mg a day  He will try to get the Trulicity through the patient assistance program from the company  Also he can look into taking Nesina and pioglitazone or the combination with Oseni, may not be able to get as was controlled with pioglitazone alone and may gain weight.  Discussed how these medications work and differences between this and Trulicity  Follow-up in 2 months  Patient Instructions  Check cost of Oseni instead of other 2 new ones  More sugars in between meals and bedtime      Menachem Urbanek 09/05/2015, 9:24 AM   Note: This office note was prepared with Estate agent. Any transcriptional errors that result from this process are unintentional.

## 2015-09-05 NOTE — Patient Instructions (Addendum)
Check cost of Oseni instead of other 2 new ones  More sugars in between meals and bedtime  Call if sugar not controlled  Recommended blood sugar levels on waking up is 90-130 and about 2 hours after meal is 140-180 Please bring blood sugar monitor to each visit.

## 2015-09-06 ENCOUNTER — Ambulatory Visit: Payer: Medicare Other | Admitting: Endocrinology

## 2015-09-20 ENCOUNTER — Other Ambulatory Visit: Payer: Self-pay | Admitting: *Deleted

## 2015-09-20 MED ORDER — SITAGLIPTIN PHOSPHATE 100 MG PO TABS: 100.0000 mg | ORAL_TABLET | Freq: Every day | ORAL | Status: DC

## 2015-10-04 ENCOUNTER — Telehealth: Payer: Self-pay | Admitting: Endocrinology

## 2015-10-04 NOTE — Telephone Encounter (Signed)
Please call back the pt

## 2015-10-05 NOTE — Telephone Encounter (Signed)
Left message for patient to call back  

## 2015-10-31 ENCOUNTER — Other Ambulatory Visit (INDEPENDENT_AMBULATORY_CARE_PROVIDER_SITE_OTHER): Payer: Medicare Other

## 2015-10-31 DIAGNOSIS — IMO0002 Reserved for concepts with insufficient information to code with codable children: Secondary | ICD-10-CM

## 2015-10-31 DIAGNOSIS — E1165 Type 2 diabetes mellitus with hyperglycemia: Secondary | ICD-10-CM | POA: Diagnosis not present

## 2015-10-31 LAB — LIPID PANEL
Cholesterol: 120 mg/dL (ref 0–200)
HDL: 56.4 mg/dL (ref >39.00)
LDL Cholesterol: 49 mg/dL (ref 0–99)
NONHDL: 63.33
Total CHOL/HDL Ratio: 2
Triglycerides: 72 mg/dL (ref 0.0–149.0)
VLDL: 14.4 mg/dL (ref 0.0–40.0)

## 2015-10-31 LAB — COMPREHENSIVE METABOLIC PANEL
ALK PHOS: 46 U/L (ref 39–117)
ALT: 34 U/L (ref 0–53)
AST: 22 U/L (ref 0–37)
Albumin: 4 g/dL (ref 3.5–5.2)
BILIRUBIN TOTAL: 0.8 mg/dL (ref 0.2–1.2)
BUN: 16 mg/dL (ref 6–23)
CALCIUM: 9.2 mg/dL (ref 8.4–10.5)
CO2: 23 meq/L (ref 19–32)
Chloride: 105 mEq/L (ref 96–112)
Creatinine, Ser: 0.8 mg/dL (ref 0.40–1.50)
GFR: 101.36 mL/min (ref >60.00)
Glucose, Bld: 174 mg/dL — ABNORMAL HIGH (ref 70–99)
Potassium: 4.2 mEq/L (ref 3.5–5.1)
Sodium: 137 mEq/L (ref 135–145)
TOTAL PROTEIN: 6.6 g/dL (ref 6.0–8.3)

## 2015-10-31 LAB — HEMOGLOBIN A1C: Hgb A1c MFr Bld: 7.5 % — ABNORMAL HIGH (ref 4.6–6.5)

## 2015-10-31 LAB — MICROALBUMIN / CREATININE URINE RATIO
CREATININE, U: 160.7 mg/dL
MICROALB/CREAT RATIO: 0.6 mg/g (ref 0.0–30.0)
Microalb, Ur: 1 mg/dL (ref 0.0–1.9)

## 2015-11-06 ENCOUNTER — Ambulatory Visit (INDEPENDENT_AMBULATORY_CARE_PROVIDER_SITE_OTHER): Payer: Medicare Other | Admitting: Endocrinology

## 2015-11-06 ENCOUNTER — Encounter: Payer: Self-pay | Admitting: Endocrinology

## 2015-11-06 VITALS — BP 136/88 | HR 69 | Temp 98.3°F | Resp 14 | Ht 72.0 in | Wt 186.0 lb

## 2015-11-06 DIAGNOSIS — E1165 Type 2 diabetes mellitus with hyperglycemia: Secondary | ICD-10-CM

## 2015-11-06 NOTE — Progress Notes (Signed)
Patient ID: Alan Lopez, male   DOB: Sep 29, 1946, 69 y.o.   MRN: [id number]           Reason for Appointment: Follow-up for Type 2 Diabetes  Referring physician: Fulp  History of Present Illness:          Date of diagnosis of type 2 diabetes mellitus :  1999      Past history:  He had modestly increase blood sugars at diagnosis and was started on metformin Subsequently glipizide was added to improve his control. He thinks that his blood sugars have been mostly poorly controlled over the last several years He had been on glipizide and metformin for years without any adjustment of his dosage of glipizide  Review of his A1c shows that it has ranged from 9-9.5 since 07/2013 On his  visit with his PCP in March he was given a sample of Jardiance 10 mg without significant improvement  Recent history:   He has stopped taking  Trulicity [id number] mg weekly that was started on his initial consultation in 5/16 when his blood sugars were mostly around 200 He now claims that last month he was having abdominal cramps but no other GI side effects and these are better with stopping Trulicity. He was also concerned about the cost of this medication   He had taken this for several months before without any side effects. He was also previously changed from metformin to metformin ER which he is tolerating better  Also glipizide was changed to glipizide ER 2.5 mg for convenience, better compliance and consistent control  A1c has increased slightly to 7.5 even though he stopped his Trulicity only about 2 weeks ago Current blood sugar patterns and problems identified:  He has been monitoring his blood sugars regularly but again only in the morning before breakfast despite reminders to do readings after meals   He has just started taking his Actos and Januvia about a week ago and his fasting blood sugars appear to be relatively higher this week  He has gained a couple of pounds and is not doing any formal  exercise  Oral hypoglycemic drugs the patient is taking are: metformin 500 mg, 2 tablets twice a day, glipizide 5 mg bid    Side effects from medications have been: Mild  diarrhea from regular metformin  Compliance with the medical regimen: fair  Hypoglycemia: never   Glucose monitoring:  done once a day usually         Glucometer:     Contour   Blood Glucose readings by monitor download shows  FASTING range  123-183 with median 155, no readings after meals    Self-care: The diet that the patient has been following is: tries to limit high carbohydrate foods. eating out about 4 times a week andsometimes fast food.     Typical meal intake: Breakfast is recently a protein drink.  Sometimes will have fried food Usually avoiding drinks with sugar               Dietician visit, most recent:never, has been scheduled               Exercise: gardening up to 3-4 hours daily    Weight history: Maximum weight 215 previously  Wt Readings from Last 3 Encounters:  11/06/15 186 lb (84.369 kg)  09/05/15 184 lb 12.8 oz (83.825 kg)  07/04/15 185 lb 6.4 oz (84.097 kg)    Glycemic control: last A1c 9.2 done on 02/13/15 Microalbumin ratio normal  in 3/16 from PCP    Lab Results  Component Value Date   HGBA1C 7.5* 10/31/2015   HGBA1C 7.1* 06/29/2015   Lab Results  Component Value Date   MICROALBUR 1.0 10/31/2015   LDLCALC 49 10/31/2015   CREATININE 0.80 10/31/2015         Medication List       This list is accurate as of: 11/06/15  3:04 PM.  Always use your most recent med list.               calcium carbonate 500 MG chewable tablet  Commonly known as:  TUMS - dosed in mg elemental calcium  Chew 1 tablet by mouth daily.     cholecalciferol 1000 UNITS tablet  Commonly known as:  VITAMIN D  Take 1,000 Units by mouth daily.     finasteride 5 MG tablet  Commonly known as:  PROSCAR  Take 5 mg by mouth daily.     glipiZIDE 2.5 MG 24 hr tablet  Commonly known as:  GLUCOTROL XL    Take 1 tablet (2.5 mg total) by mouth daily with breakfast.     ibuprofen 200 MG tablet  Commonly known as:  ADVIL,MOTRIN  Take 200 mg by mouth every 6 (six) hours as needed.     metFORMIN 500 MG 24 hr tablet  Commonly known as:  GLUCOPHAGE-XR  TAKE 2 TABLETS BY MOUTH MORNING AND EVENING     multivitamin with minerals Tabs tablet  Take 1 tablet by mouth daily.     pioglitazone 15 MG tablet  Commonly known as:  ACTOS  Take 1 tablet (15 mg total) by mouth daily.     sitaGLIPtin 100 MG tablet  Commonly known as:  JANUVIA  Take 1 tablet (100 mg total) by mouth daily.        Allergies:  Allergies  Allergen Reactions  . Codeine Other (See Comments)    Patient doesn't like how it makes him feel    No past medical history on file.  No past surgical history on file.  Family History  Problem Relation Age of Onset  . Diabetes Mother   . Diabetes Brother   . Cancer Brother   . Heart disease Neg Hx     Social History:  reports that he has been smoking.  He has never used smokeless tobacco. His alcohol and drug histories are not on file.    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   Endocrine: He was found to have a 2 cm nodule on his left thyroid on his exam but ultrasound showed multinodular goiter with mostly small nodules and the largest nodule on the left side of 1.4 cm has the appearance of a pseudo- nodule Previous TSH normal in 2015   LABS:  Lab on 10/31/2015  Component Date Value Ref Range Status  . Hgb A1c MFr Bld 10/31/2015 7.5* 4.6 - 6.5 % Final   Glycemic Control Guidelines for People with Diabetes:Non Diabetic:  <6%Goal of  Therapy: <7%Additional Action Suggested:  >8%   .  Sodium 10/31/2015 137  135 - 145 mEq/L Final  . Potassium 10/31/2015 4.2  3.5 - 5.1 mEq/L Final  . Chloride 10/31/2015 105  96 - 112 mEq/L Final  . CO2 10/31/2015 23  19 - 32 mEq/L Final  . Glucose, Bld 10/31/2015 174* 70 - 99 mg/dL Final  . BUN 10/31/2015 16  6 - 23 mg/dL Final  . Creatinine, Ser 10/31/2015 0.80  0.40 - 1.50 mg/dL Final  . Total Bilirubin 10/31/2015 0.8  0.2 - 1.2 mg/dL Final  . Alkaline Phosphatase 10/31/2015 46  39 - 117 U/L Final  . AST 10/31/2015 22  0 - 37 U/L Final  . ALT 10/31/2015 34  0 - 53 U/L Final  . Total Protein 10/31/2015 6.6  6.0 - 8.3 g/dL Final  . Albumin 10/31/2015 4.0  3.5 - 5.2 g/dL Final  . Calcium 10/31/2015 9.2  8.4 - 10.5 mg/dL Final  . GFR 10/31/2015 101.36  >60.00 mL/min Final  . Microalb, Ur 10/31/2015 1.0  0.0 - 1.9 mg/dL Final  . Creatinine,U 10/31/2015 160.7   Final  . Microalb Creat Ratio 10/31/2015 0.6  0.0 - 30.0 mg/g Final  . Cholesterol 10/31/2015 120  0 - 200 mg/dL Final   ATP III Classification       Desirable:  < 200 mg/dL               Borderline High:  200 - 239 mg/dL          High:  > = 240 mg/dL  . Triglycerides 10/31/2015 72.0  0.0 - 149.0 mg/dL Final   Normal:  <150 mg/dLBorderline High:  150 - 199 mg/dL  . HDL 10/31/2015 56.40  >39.00 mg/dL Final  . VLDL 10/31/2015 14.4  0.0 - 40.0 mg/dL Final  . LDL Cholesterol 10/31/2015 49  0 - 99 mg/dL Final  . Total CHOL/HDL Ratio 10/31/2015 2   Final                  Men          Women1/2 Average Risk     3.4          3.3Average Risk          5.0          4.42X Average Risk          9.6          7.13X Average Risk          15.0          11.0                      . NonHDL 10/31/2015 63.33   Final   NOTE:  Non-HDL goal should be 30 mg/dL higher than patient's LDL goal (i.e. LDL goal of < 70 mg/dL, would have non-HDL goal of < 100 mg/dL)    Physical Examination:  BP 136/88 mmHg  Pulse 69  Temp(Src) 98.3 F (36.8 C)  Resp 14  Ht  6' (1.829 m)  Wt 186 lb (84.369 kg)  BMI 25.22 kg/m2  SpO2 94%     ASSESSMENT:  Diabetes type 2, uncontrolled  He is getting relatively higher readings this week from stopping his Trulicity. However has only just started taking his Actos and Januvia this week He also is not checking readings after meals as discussed previously  . LIPIDS: Normal without medications  PLAN:   He was advised to start checking his blood sugars  consistently after meals and less often in the morning  Continue metformin ER 2000 mg a day  He will  continue the same doses of medications but consider increasing glipizide or Actos based on his blood sugar patterns on his follow-up in 2 months.  Consistent exercise, he will join the [facility] for increased activity during winter    Patient Instructions  Check blood sugars on waking up 3 times a week Also check blood sugars about 2 hours after a meal and do this after different meals by rotation  Recommended blood sugar levels on waking up is 90-130 and about 2 hours after meal is 130-160  Please bring your blood sugar monitor to each visit, thank you        [facility] 11/06/2015, 3:04 PM   Note: This office note was prepared with Dragon voice recognition system technology. Any transcriptional errors that result from this process are unintentional.

## 2015-11-06 NOTE — Patient Instructions (Addendum)
Check blood sugars on waking up 3  times a week  Also check blood sugars about 2 hours after a meal and do this after different meals by rotation  Recommended blood sugar levels on waking up is 90-130 and about 2 hours after meal is 130-160  Please bring your blood sugar monitor to each visit, thank you  

## 2015-12-11 DIAGNOSIS — R001 Bradycardia, unspecified: Secondary | ICD-10-CM | POA: Diagnosis not present

## 2015-12-11 DIAGNOSIS — E785 Hyperlipidemia, unspecified: Secondary | ICD-10-CM | POA: Diagnosis not present

## 2015-12-11 DIAGNOSIS — N400 Enlarged prostate without lower urinary tract symptoms: Secondary | ICD-10-CM | POA: Diagnosis present

## 2015-12-11 DIAGNOSIS — I214 Non-ST elevation (NSTEMI) myocardial infarction: Secondary | ICD-10-CM | POA: Diagnosis not present

## 2015-12-11 DIAGNOSIS — Z7984 Long term (current) use of oral hypoglycemic drugs: Secondary | ICD-10-CM | POA: Diagnosis not present

## 2015-12-11 DIAGNOSIS — R079 Chest pain, unspecified: Secondary | ICD-10-CM | POA: Diagnosis not present

## 2015-12-11 DIAGNOSIS — R9431 Abnormal electrocardiogram [ECG] [EKG]: Secondary | ICD-10-CM | POA: Diagnosis not present

## 2015-12-11 DIAGNOSIS — E119 Type 2 diabetes mellitus without complications: Secondary | ICD-10-CM | POA: Diagnosis not present

## 2015-12-11 DIAGNOSIS — I10 Essential (primary) hypertension: Secondary | ICD-10-CM | POA: Diagnosis not present

## 2015-12-11 DIAGNOSIS — F17210 Nicotine dependence, cigarettes, uncomplicated: Secondary | ICD-10-CM | POA: Diagnosis present

## 2015-12-14 ENCOUNTER — Other Ambulatory Visit: Payer: Self-pay | Admitting: Endocrinology

## 2015-12-29 ENCOUNTER — Encounter: Payer: Self-pay | Admitting: Cardiology

## 2015-12-29 ENCOUNTER — Ambulatory Visit (INDEPENDENT_AMBULATORY_CARE_PROVIDER_SITE_OTHER): Payer: Medicare Other | Admitting: Cardiology

## 2015-12-29 VITALS — BP 126/74 | HR 56 | Ht 72.0 in | Wt 182.8 lb

## 2015-12-29 DIAGNOSIS — Z9861 Coronary angioplasty status: Secondary | ICD-10-CM | POA: Diagnosis not present

## 2015-12-29 DIAGNOSIS — E119 Type 2 diabetes mellitus without complications: Secondary | ICD-10-CM

## 2015-12-29 DIAGNOSIS — I214 Non-ST elevation (NSTEMI) myocardial infarction: Secondary | ICD-10-CM | POA: Diagnosis not present

## 2015-12-29 DIAGNOSIS — E785 Hyperlipidemia, unspecified: Secondary | ICD-10-CM | POA: Diagnosis not present

## 2015-12-29 DIAGNOSIS — I2583 Coronary atherosclerosis due to lipid rich plaque: Secondary | ICD-10-CM

## 2015-12-29 DIAGNOSIS — I252 Old myocardial infarction: Secondary | ICD-10-CM | POA: Insufficient documentation

## 2015-12-29 DIAGNOSIS — I2510 Atherosclerotic heart disease of native coronary artery without angina pectoris: Secondary | ICD-10-CM | POA: Insufficient documentation

## 2015-12-29 MED ORDER — CLOPIDOGREL BISULFATE 75 MG PO TABS: 75.0000 mg | ORAL_TABLET | Freq: Every day | ORAL | Status: DC

## 2015-12-29 MED ORDER — CLOPIDOGREL BISULFATE 300 MG PO TABS: 300.0000 mg | ORAL_TABLET | Freq: Once | ORAL | Status: DC

## 2015-12-29 NOTE — Patient Instructions (Signed)
Medication Instructions:  Please finish the Brilinta you have (through 01/12/16). The next day take Plavix 300 mg once.  The following day start 75 mg a day. Continue all other medications as listed.  You have been referred to Cardiac Rehab.  Follow-Up: Follow up in 4 to 6 weeks with Dr Marlou Porch.  If you need a refill on your cardiac medications before your next appointment, please call your pharmacy.  Thank you for choosing [facility]!!

## 2015-12-29 NOTE — Progress Notes (Signed)
Cardiology Office Note    Date:  12/29/2015   ID:  Alan Lopez, DOB 1946/11/11, MRN [id number]  PCP:  Antony Blackbird, MD  Cardiologist:   Candee Furbish, MD     History of Present Illness:  Alan Lopez is a 70 y.o. male with history of non-ST elevation myocardial infarction, hypertension, tobacco use, hyperlipidemia, diabetes here for heart evaluation.  He was in [place] at [facility] and I reviewed consultation from 12/12/15 by Dr. Alla German of cardiology. Had chest discomfort, general uneasiness followed by left-sided chest discomfort. Pressure. No radiation to arms or jaw. He recently visited family in [place] at altitude and had no difficulty.  His EKG showed junctional bradycardia with lateral ST and T-wave inversion. Echocardiogram showed mild inferior base height both kidney cyst. Troponin was 5.7 initially. EKG personally viewed.  He underwent cardiac catheterization on 12/12/15 where he had severe disease of his right coronary artery and successful DES placement to the right coronary artery. Transient no reflow. LV function appeared preserved. Left ventricular end-diastolic pressure was 16 mmHg.  Feeling OK. Brilinta. Feeling dyspnea. Thingks it is Brilinta. More after taking it. Orthopnea. After 2-3 hours feels OK.   Past Medical History  Diagnosis Date  . Hyperlipidemia   . HTN (hypertension)   . Hypertrophy (benign) of prostate   . Tobacco abuse   . Status post non-ST elevation myocardial infarction (NSTEMI)     CATH  . Non-insulin dependent type 2 diabetes mellitus ([place])   . Multinodular goiter 04/12/2015  . 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 Type II diabetes mellitus, uncontrolled ([place]) 04/12/2015    Past Surgical History  Procedure Laterality Date  . Coronary angioplasty with stent placement      Outpatient Prescriptions Prior to Visit  Medication Sig Dispense Refill  . aspirin EC 81 MG tablet Take 81 mg by mouth daily.    Marland Kitchen  atorvastatin (LIPITOR) 80 MG tablet Take 80 mg by mouth daily.    . calcium carbonate (TUMS - DOSED IN MG ELEMENTAL CALCIUM) 500 MG chewable tablet Chew 1 tablet by mouth daily.    . cholecalciferol (VITAMIN D) 1000 UNITS tablet Take 1,000 Units by mouth daily.    . fexofenadine (ALLEGRA) 180 MG tablet Take 180 mg by mouth daily.    . finasteride (PROSCAR) 5 MG tablet Take 5 mg by mouth daily.  3  . fluticasone (FLONASE) 50 MCG/ACT nasal spray Place 2 sprays into both nostrils daily.    Marland Kitchen glipiZIDE (GLUCOTROL XL) 2.5 MG 24 hr tablet Take 1 tablet (2.5 mg total) by mouth daily with breakfast. 90 tablet 2  . lisinopril (PRINIVIL,ZESTRIL) 2.5 MG tablet Take 2.5 mg by mouth daily.    . metFORMIN (GLUCOPHAGE-XR) 500 MG 24 hr tablet TAKE 2 TABLETS BY MOUTH MORNING AND EVENING 120 tablet 3  . metoprolol tartrate (LOPRESSOR) 25 MG tablet Take 25 mg by mouth 2 (two) times daily.    . Multiple Vitamin (MULTIVITAMIN WITH MINERALS) TABS tablet Take 1 tablet by mouth daily.    . pantoprazole (PROTONIX) 40 MG tablet Take 40 mg by mouth daily.    . pioglitazone (ACTOS) 15 MG tablet TAKE 1 TABLET(15 MG) BY MOUTH DAILY 30 tablet 3  . sitaGLIPtin (JANUVIA) 100 MG tablet Take 1 tablet (100 mg total) by mouth daily. 30 tablet 3  . ticagrelor (BRILINTA) 90 MG TABS tablet Take 90 mg by mouth 2 (two) times daily.    . bacitracin ointment Apply 1 application topically 2 (  two) times daily.    Marland Kitchen ibuprofen (ADVIL,MOTRIN) 200 MG tablet Take 200 mg by mouth every 6 (six) hours as needed.     No facility-administered medications prior to visit.     Allergies:   Codeine   Social History   Social History  . Marital Status: Married    Spouse Name: N/A  . Number of Children: N/A  . Years of Education: N/A   Social History Main Topics  . Smoking status: Former Smoker    Quit date: 12/11/2015  . Smokeless tobacco: Never Used  . Alcohol Use: None  . Drug Use: None  . Sexual Activity: Not Asked   Other Topics  Concern  . None   Social History Narrative     Family History:  The patient's family history includes Cancer in his brother; Diabetes in his brother and mother. There is no history of Heart disease.   ROS:   Please see the history of present illness.    ROS easy bruising from dual antiplatelet therapy. No recent chest pain, he is feeling some shortness of breath after Brilinta. No strokelike symptoms, no significant orthopnea. All other systems reviewed and are negative.   PHYSICAL EXAM:   VS:  BP 126/74 mmHg  Pulse 56  Ht 6' (1.829 m)  Wt 182 lb 12.8 oz (82.918 kg)  BMI 24.79 kg/m2  SpO2 97%   GEN: Well nourished, well developed, in no acute distress HEENT: normal Neck: no JVD, carotid bruits, or masses Cardiac: RRR; no murmurs, rubs, or gallops,no edema  Respiratory:  clear to auscultation bilaterally, normal work of breathing GI: soft, nontender, nondistended, + BS MS: no deformity or atrophy Skin: warm and dry, no rash Neuro:  Alert and Oriented x 3, Strength and sensation are intact Psych: euthymic mood, full affect  Wt Readings from Last 3 Encounters:  12/29/15 182 lb 12.8 oz (82.918 kg)  11/06/15 186 lb (84.369 kg)  09/05/15 184 lb 12.8 oz (83.825 kg)      Studies/Labs Reviewed:   EKG:  12/11/15 shows accelerated junctional rhythm with deep T-wave inversion in 1, aVL, V2 as well as T-wave inversion in V4 through V6. Heart rate 86 bpm.  Recent Labs: 10/31/2015: ALT 34; BUN 16; Creatinine, Ser 0.80; Potassium 4.2; Sodium 137   Lipid Panel    Component Value Date/Time   CHOL 120 10/31/2015 0857   TRIG 72.0 10/31/2015 0857   HDL 56.40 10/31/2015 0857   CHOLHDL 2 10/31/2015 0857   VLDL 14.4 10/31/2015 0857   LDLCALC 49 10/31/2015 0857    Additional studies/ records that were reviewed today include:  Records from [facility], cardiac catheterization, EKG, lab work reviewed, consultation reviewed    ASSESSMENT:    1. S/P PTCA (percutaneous transluminal  coronary angioplasty)   2. NSTEMI (non-ST elevated myocardial infarction) ([place])   3. Coronary artery disease due to lipid rich plaque   4. Hyperlipidemia   5. Non-insulin treated type 2 diabetes mellitus (HCC)      PLAN:  In order of problems listed above:  Non-ST elevation myocardial infarction  - 12/12/15  - RCA DES  - EF, mild mitral regurgitation, mild tricuspid regurgitation    - Basal inferior hypokinesis.  - Markedly abnormal T-wave inversion on EKG. These EKG changes resolved on subsequent EKG. Personally viewed.  - Given his dyspnea that seems to be temporally related to Brilinta administration, we will change him over to Plavix after one month of Brilinta is completed. We will load him  with 300 milligrams then 75 mg thereafter.  - Continue dual antiplatelet therapy for one year.  - We will go ahead and write order for cardiac rehabilitation  - Start slowly with yard work at home. He enjoys this.  Coronary artery disease  - Right coronary artery stenting-80-90% lesion of RCA. Single DES.  - First diagonal ostial 40%.  - Aggressive secondary prevention  - Beta blocker, ACE inhibitor  Hyperlipidemia  - Atorvastatin 80 mg per guidelines. At subsequent visit, we may decide to decrease to 40 mg.  - total cholesterol 117, triglycerides 150, LDL 38, HDL 48,  Diabetes with cardiac complications  - 123456 7.4  - Per primary, Dr. Chapman Fitch    Medication Adjustments/Labs and Tests Ordered: Current medicines are reviewed at length with the patient today.  Concerns regarding medicines are outlined above.  Medication changes, Labs and Tests ordered today are listed in the Patient Instructions below. Patient Instructions  Medication Instructions:  Please finish the Brilinta you have (through 01/12/16). The next day take Plavix 300 mg once.  The following day start 75 mg a day. Continue all other medications as listed.  You have been referred to Cardiac Rehab.  Follow-Up: Follow up in 4  to 6 weeks with Dr Marlou Porch.  If you need a refill on your cardiac medications before your next appointment, please call your pharmacy.  Thank you for choosing [facility]!!             Signed, Candee Furbish, MD  12/29/2015 1:29 PM    [facility] [place], [place], [place]  [address] Phone: [phone]; Fax: [phone]

## 2016-01-02 ENCOUNTER — Other Ambulatory Visit: Payer: Medicare Other

## 2016-01-03 ENCOUNTER — Encounter: Payer: Medicare Other | Admitting: Endocrinology

## 2016-01-03 ENCOUNTER — Other Ambulatory Visit (INDEPENDENT_AMBULATORY_CARE_PROVIDER_SITE_OTHER): Payer: Medicare Other

## 2016-01-03 ENCOUNTER — Other Ambulatory Visit: Payer: Self-pay | Admitting: *Deleted

## 2016-01-03 DIAGNOSIS — E1165 Type 2 diabetes mellitus with hyperglycemia: Secondary | ICD-10-CM | POA: Diagnosis not present

## 2016-01-03 LAB — BASIC METABOLIC PANEL
BUN: 17 mg/dL (ref 6–23)
CO2: 27 meq/L (ref 19–32)
Calcium: 9.4 mg/dL (ref 8.4–10.5)
Chloride: 104 mEq/L (ref 96–112)
Creatinine, Ser: 0.85 mg/dL (ref 0.40–1.50)
GFR: 94.47 mL/min (ref >60.00)
GLUCOSE: 212 mg/dL — AB (ref 70–99)
POTASSIUM: 4.5 meq/L (ref 3.5–5.1)
SODIUM: 139 meq/L (ref 135–145)

## 2016-01-03 MED ORDER — GLUCOSE BLOOD VI STRP: ORAL_STRIP | Status: DC

## 2016-01-03 MED ORDER — BAYER MICROLET LANCETS MISC: Status: DC

## 2016-01-04 ENCOUNTER — Telehealth: Payer: Self-pay | Admitting: Cardiology

## 2016-01-04 ENCOUNTER — Ambulatory Visit: Payer: Medicare Other | Admitting: Endocrinology

## 2016-01-04 LAB — FRUCTOSAMINE: FRUCTOSAMINE: 273 umol/L (ref 0–285)

## 2016-01-04 NOTE — Progress Notes (Signed)
This encounter was created in error - please disregard.

## 2016-01-04 NOTE — Telephone Encounter (Signed)
I spoke with the pt and he is following up to find out when he will be contacted to start cardiac rehab. I made him aware that I will touch base with cardiac rehab and they should be in contact with him next week.  I spoke with Thayer Headings at cardiac rehab and they do have an order for this pt to participate in rehab and they will contact him early next week.

## 2016-01-04 NOTE — Telephone Encounter (Signed)
New Message  Pt called request a call back to discuss setting up for cardiac rehab.

## 2016-01-09 ENCOUNTER — Telehealth (HOSPITAL_COMMUNITY): Payer: Self-pay | Admitting: *Deleted

## 2016-01-17 ENCOUNTER — Encounter: Payer: Medicare Other | Attending: Endocrinology | Admitting: Nutrition

## 2016-01-17 ENCOUNTER — Ambulatory Visit (INDEPENDENT_AMBULATORY_CARE_PROVIDER_SITE_OTHER): Payer: Medicare Other | Admitting: Endocrinology

## 2016-01-17 ENCOUNTER — Encounter: Payer: Self-pay | Admitting: Endocrinology

## 2016-01-17 ENCOUNTER — Other Ambulatory Visit: Payer: Self-pay | Admitting: *Deleted

## 2016-01-17 VITALS — BP 112/64 | HR 55 | Temp 97.7°F | Resp 14 | Ht 72.0 in | Wt 181.4 lb

## 2016-01-17 DIAGNOSIS — I2510 Atherosclerotic heart disease of native coronary artery without angina pectoris: Secondary | ICD-10-CM | POA: Diagnosis not present

## 2016-01-17 DIAGNOSIS — E1165 Type 2 diabetes mellitus with hyperglycemia: Secondary | ICD-10-CM | POA: Insufficient documentation

## 2016-01-17 DIAGNOSIS — IMO0001 Reserved for inherently not codable concepts without codable children: Secondary | ICD-10-CM

## 2016-01-17 MED ORDER — INSULIN PEN NEEDLE 32G X 4 MM MISC: Status: DC

## 2016-01-17 MED ORDER — INSULIN GLARGINE 300 UNIT/ML ~~LOC~~ SOPN: 6.0000 [IU] | PEN_INJECTOR | Freq: Every day | SUBCUTANEOUS | Status: DC

## 2016-01-17 NOTE — Progress Notes (Signed)
Patient was instructed on the use of the Toujeo pe-- how much to take, where to inject, how to increase the dose, and rotation sites. We discussed how this insulin works, and he was given a brochure on Toujeo--how it works, and how to use the pen.  He had no questions. We also discussed low blood sugars--symtoms and treatment.  He reported good understanding of this and had no final questions. He was given my card and told to call if questions develop.

## 2016-01-17 NOTE — Progress Notes (Signed)
Patient ID: Alan Lopez, male   DOB: 19-Mar-1946, 70 y.o.   MRN: [id number]           Reason for Appointment: Follow-up for Type 2 Diabetes  Referring physician: Fulp  History of Present Illness:          Date of diagnosis of type 2 diabetes mellitus :  1999      Past history:  He had modestly increase blood sugars at diagnosis and was started on metformin Subsequently glipizide was added to improve his control. He thinks that his blood sugars have been mostly poorly controlled over the last several years He had been on glipizide and metformin for years without any adjustment of his dosage of glipizide  Review of his A1c shows that it had ranged from 9-9.5 since 07/2013 Previously his PCP tried him on a sample of Jardiance 10 mg without significant improvement  Recent history:   Oral hypoglycemic drugs the patient is taking are: metformin ER 500 mg, 2 tablets twice a day, glipizide ER 2.5 mg, Actos 15 mg daily, Januvia 100 mg daily      He has stopped taking  Trulicity [id number] mg weekly that was started on his initial consultation in 5/16 when his blood sugars were mostly around 200 He now claims that last month he was having abdominal cramps but no other GI side effects and these are better with stopping Trulicity despite having tolerated for several months previously.  A1c  increased slightly to 7.5 in 11/16 He was tried on Januvia instead of Trulicity and Actos added  Current blood sugar patterns and problems identified:  He has been monitoring his blood sugars mostly in the mornings and no readings after meals that he forgets  Blood sugars are higher now with current regimen  Also has not been consistently active especially with his MI last month  He has concerns about the cost of Actos and Januvia   Weight is down a little  Side effects from medications have been: Mild  diarrhea from regular metformin  Compliance with the medical regimen: fair  Hypoglycemia: never    Glucose monitoring:  done once a day usually         Glucometer:     Contour   Blood Glucose readings by monitor download shows   FASTING range 161-195 with AVERAGE 179, no readings after meals    Self-care: The diet that the patient has been following is: tries to limit high carbohydrate foods. eating out about 4 times a week andsometimes fast food.     Typical meal intake: Breakfast is recently a protein drink.  Sometimes will have fried food Usually avoiding drinks with sugar               Dietician visit, most recent:never, has been scheduled               Exercise: gardening  Weight history: Maximum weight 215 previously  Wt Readings from Last 3 Encounters:  01/17/16 181 lb 6.4 oz (82.283 kg)  12/29/15 182 lb 12.8 oz (82.918 kg)  11/06/15 186 lb (84.369 kg)    Glycemic control: last A1c 9.2 done on 02/13/15 Microalbumin ratio normal in 3/16 from PCP    Lab Results  Component Value Date   HGBA1C 7.5* 10/31/2015   HGBA1C 7.1* 06/29/2015   Lab Results  Component Value Date   MICROALBUR 1.0 10/31/2015   LDLCALC 49 10/31/2015   CREATININE 0.85 01/03/2016  Medication List       This list is accurate as of: 01/17/16  8:49 AM.  Always use your most recent med list.               aspirin EC 81 MG tablet  Take 81 mg by mouth daily.     atorvastatin 80 MG tablet  Commonly known as:  LIPITOR  Take 80 mg by mouth daily.     BAYER MICROLET LANCETS lancets  Use as instructed to check blood sugar 2 times per day dx code E11.65     calcium carbonate 500 MG chewable tablet  Commonly known as:  TUMS - dosed in mg elemental calcium  Chew 1 tablet by mouth daily.     cholecalciferol 1000 units tablet  Commonly known as:  VITAMIN D  Take 1,000 Units by mouth daily.     clopidogrel 300 MG Tabs tablet  Commonly known as:  PLAVIX  Take 1 tablet (300 mg total) by mouth once.     clopidogrel 75 MG tablet  Commonly known as:  PLAVIX  Take 1 tablet (75 mg  total) by mouth daily.     fexofenadine 180 MG tablet  Commonly known as:  ALLEGRA  Take 180 mg by mouth daily. Reported on 01/17/2016     finasteride 5 MG tablet  Commonly known as:  PROSCAR  Take 5 mg by mouth daily.     fluticasone 50 MCG/ACT nasal spray  Commonly known as:  FLONASE  Place 2 sprays into both nostrils daily. Reported on 01/17/2016     glipiZIDE 2.5 MG 24 hr tablet  Commonly known as:  GLUCOTROL XL  Take 1 tablet (2.5 mg total) by mouth daily with breakfast.     glucose blood test strip  Commonly known as:  BAYER CONTOUR NEXT TEST  Use as instructed to check blood sugar 2 times per day dx code E11.65     lisinopril 2.5 MG tablet  Commonly known as:  PRINIVIL,ZESTRIL  Take 2.5 mg by mouth daily.     metFORMIN 500 MG 24 hr tablet  Commonly known as:  GLUCOPHAGE-XR  TAKE 2 TABLETS BY MOUTH MORNING AND EVENING     metoprolol tartrate 25 MG tablet  Commonly known as:  LOPRESSOR  Take 25 mg by mouth 2 (two) times daily.     multivitamin with minerals Tabs tablet  Take 1 tablet by mouth daily.     pantoprazole 40 MG tablet  Commonly known as:  PROTONIX  Take 40 mg by mouth daily.     pioglitazone 15 MG tablet  Commonly known as:  ACTOS  TAKE 1 TABLET(15 MG) BY MOUTH DAILY     sitaGLIPtin 100 MG tablet  Commonly known as:  JANUVIA  Take 1 tablet (100 mg total) by mouth daily.        Allergies:  Allergies  Allergen Reactions  . Codeine Other (See Comments)    Patient doesn't like how it makes him feel    Past Medical History  Diagnosis Date  . Hyperlipidemia   . HTN (hypertension)   . Hypertrophy (benign) of prostate   . Tobacco abuse   . Status post non-ST elevation myocardial infarction (NSTEMI)     CATH  . Non-insulin dependent type 2 diabetes mellitus ([place])   . Multinodular goiter 04/12/2015  . 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 Type II diabetes mellitus, uncontrolled ([place]) 04/12/2015    Past Surgical History  Procedure Laterality Date  .  Coronary angioplasty with stent placement      Family History  Problem Relation Age of Onset  . Diabetes Mother   . Diabetes Brother   . Cancer Brother   . Heart disease Neg Hx     Social History:  reports that he quit smoking about 5 weeks ago. He has never used smokeless tobacco. His alcohol and drug histories are not on file.    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   THYROID: He was found to have a 2 cm nodule on his left thyroid on his exam but ultrasound showed multinodular goiter with mostly small nodules and the largest nodule on the left side of 1.4 cm has the appearance of a pseudo- nodule Previous TSH normal in 2015   LABS:  No visits with results within 1 Week(s) from this visit. Latest known visit with results is:  Lab on 01/03/2016  Component Date Value Ref Range Status  . Sodium 01/03/2016 139  135 - 145 mEq/L Final  . Potassium 01/03/2016 4.5  3.5 - 5.1 mEq/L Final  . Chloride 01/03/2016 104  96 - 112 mEq/L Final  . CO2 01/03/2016 27  19 - 32 mEq/L Final  . Glucose, Bld 01/03/2016 212* 70 - 99 mg/dL Final  . BUN 01/03/2016 17  6 - 23 mg/dL Final  . Creatinine, Ser 01/03/2016 0.85  0.40 - 1.50 mg/dL Final  . Calcium 01/03/2016 9.4  8.4 - 10.5 mg/dL Final  . GFR 01/03/2016 94.47  >60.00 mL/min Final  . Fructosamine 01/03/2016 273  0 - 285 umol/L Final   Comment: Published reference interval for apparently healthy subjects between age 37 and 36 is 42 - 285 umol/L and in a poorly controlled diabetic population is 228 - 563 umol/L with a mean of 396 umol/L.     Physical Examination:  BP  112/64 mmHg  Pulse 55  Temp(Src) 97.7 F (36.5 C)  Resp 14  Ht 6' (1.829 m)  Wt 181 lb 6.4 oz (82.283 kg)  BMI 24.60 kg/m2  SpO2 95%     ASSESSMENT:  Diabetes type 2, uncontrolled  See history of present illness for detailed discussion of his current management, blood sugar patterns and problems identified His blood sugars are poorly controlled now with fasting readings as high as 212 on the lab Fructosamine is high normal, relatively higher than before Since his fructosamine is not significantly high he may be getting mostly high readings fasting and not after meals Again not checking readings after meals as discussed on each visit  Not benefiting from Januvia, Actos added onto his metformin and glipizide Discussed that he is likely to be getting insulin deficiency especially with his being at a relatively normal BMI  PLAN:   INSULIN: He will start with Toujeo 6 units at bedtime This will be titrated every 3 days Discussed actions of basal insulin and timing of injection as well as improvement of fasting readings as a target He will be shown how to do  this in detail by nurse educator   He was advised to start checking his blood sugars  consistently after meals at least once a day  Continue metformin ER 2000 mg a day  He will stop his Januvia and glipizide when finished  Consistent exercise, he will be starting cardiac rehabilitation soon which will help   Patient Instructions  Stop januvia and Glipizide when out   Toujeo insulin: This insulin provides blood sugar control for up to 24 hours.   Start with 6 units at bedtime daily and increase by 2 units every 3 days until the waking up sugars are under 130.   Then continue the same dose. If blood sugar is under 90 for 2 days in a row, reduce the dose by 2 units. Note that this insulin does not control the rise of blood sugar with meals    Check blood sugars on waking up  Daily   Also check blood sugars about 2 hours  after a meal and do this after different meals by rotation  Recommended blood sugar levels on waking up is 90-130 and about 2 hours after meal is 130-160  Please bring your blood sugar monitor to each visit, thank you    Counseling time on subjects discussed above is over 50% of today's 25 minute visit   Tarrie Mcmichen 01/17/2016, 8:49 AM   Note: This office note was prepared with Dragon voice recognition system technology. Any transcriptional errors that result from this process are unintentional.

## 2016-01-17 NOTE — Patient Instructions (Addendum)
Stop januvia and Glipizide when out   Toujeo insulin: This insulin provides blood sugar control for up to 24 hours.   Start with 6 units at bedtime daily and increase by 2 units every 3 days until the waking up sugars are under 130.   Then continue the same dose. If blood sugar is under 90 for 2 days in a row, reduce the dose by 2 units. Note that this insulin does not control the rise of blood sugar with meals    Check blood sugars on waking up  Daily   Also check blood sugars about 2 hours after a meal and do this after different meals by rotation  Recommended blood sugar levels on waking up is 90-130 and about 2 hours after meal is 130-160  Please bring your blood sugar monitor to each visit, thank you

## 2016-01-17 NOTE — Patient Instructions (Signed)
Take 6u of Toujeo at bedtime Wait 3 days, if morning blood sugars are over 130, increase the dose by 1-2u. Test blood sugars before breakfast and 2 hours after one meal.

## 2016-01-25 ENCOUNTER — Encounter (HOSPITAL_COMMUNITY)
Admission: RE | Admit: 2016-01-25 | Discharge: 2016-01-25 | Disposition: A | Discharge status: Home / Self Care | Payer: Medicare Other | Source: Ambulatory Visit | Attending: Cardiology | Admitting: Cardiology

## 2016-01-25 DIAGNOSIS — Z9861 Coronary angioplasty status: Secondary | ICD-10-CM | POA: Insufficient documentation

## 2016-01-25 DIAGNOSIS — I213 ST elevation (STEMI) myocardial infarction of unspecified site: Secondary | ICD-10-CM | POA: Insufficient documentation

## 2016-01-25 NOTE — Progress Notes (Signed)
Cardiac Rehab Medication Review by a Pharmacist  Does the patient  feel that his/her medications are working for him/her?  yes  Has the patient been experiencing any side effects to the medications prescribed?  yes  Does the patient measure his/her own blood pressure or blood glucose at home?  no   Does the patient have any problems obtaining medications due to transportation or finances?   no  Understanding of regimen: good Understanding of indications: good Potential of compliance: good  Pharmacist comments: I encouraged Mr. Vandongen to check his blood pressure at home. He feels like his diabetes medications aren't working, which is why they are changing him to insulin. He feels like he's having a hard time with controlling his diet because everything has carbs, including his favorite veggies. He is having a lot of GI upset from his metformin, which is why he recently started taking the ER version.   Governor Specking, PharmD Clinical Pharmacy Resident Pager: [phone] 01/25/2016 8:49 AM

## 2016-01-29 ENCOUNTER — Encounter (HOSPITAL_COMMUNITY)
Admission: RE | Admit: 2016-01-29 | Discharge: 2016-01-29 | Disposition: A | Discharge status: Home / Self Care | Payer: Medicare Other | Source: Ambulatory Visit | Attending: Cardiology | Admitting: Cardiology

## 2016-01-29 ENCOUNTER — Telehealth (HOSPITAL_COMMUNITY): Payer: Self-pay | Admitting: Cardiac Rehabilitation

## 2016-01-29 ENCOUNTER — Encounter (HOSPITAL_COMMUNITY): Payer: Self-pay

## 2016-01-29 ENCOUNTER — Other Ambulatory Visit: Payer: Self-pay | Admitting: *Deleted

## 2016-01-29 DIAGNOSIS — Z9861 Coronary angioplasty status: Secondary | ICD-10-CM | POA: Diagnosis not present

## 2016-01-29 DIAGNOSIS — I213 ST elevation (STEMI) myocardial infarction of unspecified site: Secondary | ICD-10-CM | POA: Diagnosis not present

## 2016-01-29 LAB — GLUCOSE, CAPILLARY
Glucose-Capillary: 181 mg/dL — ABNORMAL HIGH (ref 65–99)
Glucose-Capillary: 93 mg/dL (ref 65–99)

## 2016-01-29 MED ORDER — LOSARTAN POTASSIUM 25 MG PO TABS: 12.5000 mg | ORAL_TABLET | Freq: Every day | ORAL | Status: DC

## 2016-01-29 NOTE — Progress Notes (Addendum)
Pt started cardiac rehab today.  Pt tolerated light exercise without difficulty. VSS, telemetry-sinus rhythm, first degree AV block, asymptomatic.  Medication list reconciled.  Pt verbalized compliance with medications and denies barriers to compliance. However pt does c/o dry cough since starting lisinopril. Dr. Marlou Porch made aware. Pt reports he is anticipating changes to his diabetic regimen this week. Pt will start insulin once his current supply of PO meds is complete. Pt will let staff know once this transition occurs.   PSYCHOSOCIAL ASSESSMENT:  PHQ-0. Pt exhibits positive coping skills, hopeful outlook with supportive family. No psychosocial needs identified at this time, no psychosocial interventions necessary.    Pt enjoys  Travel, yardwork, photography, and  gardening.   Pt cardiac rehab  goal is  to learn his limitations to return to previous activities  safely, learn heart healthy lifestyle.  Pt encouraged to participate in home exercise in addition to cardiac rehab exercise and attend risk factor education classes  to increase ability to achieve these goals.   Pt oriented to exercise equipment and routine.  Understanding verbalized.

## 2016-01-29 NOTE — Telephone Encounter (Signed)
-----   Message from Shellia Cleverly, RN sent at 01/29/2016 10:42 AM EST ----- Regarding: FW: cardiac rehab I will send the RX in for losartan 12.5 daily if you will let the pt know of the medication change.  Thanks so much!   ----- Message -----    From: Jerline Pain, MD    Sent: 01/29/2016  10:09 AM      To: Shellia Cleverly, RN Subject: FW: cardiac rehab                              Stop lisinopril 2.5 mg and start losartan 12.5 mg once a day. Candee Furbish, MD   ----- Message -----    From: Lowell Guitar, RN    Sent: 01/29/2016   9:41 AM      To: Jerline Pain, MD Subject: cardiac rehab                                  Dear Dr. Marlou Porch,  Pt started cardiac rehab today.  Great first day!  However pt does report he has had a dry cough since starting lisinopril.  His resting BP 118/62 HR-51 and Peak exercise BP: 148/78 HR-71. His next office visit is 02/14/16 with you.  Thank you, Andi Hence, RN, BSN Cardiac Pulmonary Rehab

## 2016-01-29 NOTE — Telephone Encounter (Signed)
Pt made aware to stop lisinopril and start losartan.  Understanding verbalized.

## 2016-01-31 ENCOUNTER — Encounter (HOSPITAL_COMMUNITY)
Admission: RE | Admit: 2016-01-31 | Discharge: 2016-01-31 | Disposition: A | Discharge status: Home / Self Care | Payer: Medicare Other | Source: Ambulatory Visit | Attending: Cardiology | Admitting: Cardiology

## 2016-01-31 DIAGNOSIS — Z9861 Coronary angioplasty status: Secondary | ICD-10-CM | POA: Diagnosis not present

## 2016-01-31 DIAGNOSIS — I213 ST elevation (STEMI) myocardial infarction of unspecified site: Secondary | ICD-10-CM | POA: Diagnosis not present

## 2016-01-31 LAB — GLUCOSE, CAPILLARY
GLUCOSE-CAPILLARY: 164 mg/dL — AB (ref 65–99)
GLUCOSE-CAPILLARY: 81 mg/dL (ref 65–99)

## 2016-02-02 ENCOUNTER — Encounter (HOSPITAL_COMMUNITY)
Admission: RE | Admit: 2016-02-02 | Discharge: 2016-02-02 | Disposition: A | Discharge status: Home / Self Care | Payer: Medicare Other | Source: Ambulatory Visit | Attending: Cardiology | Admitting: Cardiology

## 2016-02-02 DIAGNOSIS — I213 ST elevation (STEMI) myocardial infarction of unspecified site: Secondary | ICD-10-CM | POA: Diagnosis not present

## 2016-02-02 DIAGNOSIS — Z9861 Coronary angioplasty status: Secondary | ICD-10-CM | POA: Diagnosis not present

## 2016-02-02 LAB — GLUCOSE, CAPILLARY
GLUCOSE-CAPILLARY: 138 mg/dL — AB (ref 65–99)
GLUCOSE-CAPILLARY: 93 mg/dL (ref 65–99)

## 2016-02-05 ENCOUNTER — Encounter (HOSPITAL_COMMUNITY)
Admission: RE | Admit: 2016-02-05 | Discharge: 2016-02-05 | Disposition: A | Discharge status: Home / Self Care | Payer: Medicare Other | Source: Ambulatory Visit | Attending: Cardiology | Admitting: Cardiology

## 2016-02-05 DIAGNOSIS — Z9861 Coronary angioplasty status: Secondary | ICD-10-CM | POA: Diagnosis not present

## 2016-02-05 DIAGNOSIS — I213 ST elevation (STEMI) myocardial infarction of unspecified site: Secondary | ICD-10-CM | POA: Diagnosis not present

## 2016-02-05 LAB — GLUCOSE, CAPILLARY: GLUCOSE-CAPILLARY: 185 mg/dL — AB (ref 65–99)

## 2016-02-05 NOTE — Progress Notes (Signed)
QUALITY OF LIFE SCORE REVIEW  Pt completed Quality of Life survey as a participant in Cardiac Rehab. Scores 21.0 or below are considered low. Pt score very low in several areas Overall 18.68, Health and Function 16.60, socioeconomic 20.71, physiological and spiritual 22.5, family 16.70. Patient quality of life slightly altered by physical constraints which limits ability to perform as prior to recent cardiac illness. Pt is discouraged with having CAD in addition to diabetes. Pt is eager to work towards decreasing risk factors. Pt verbalizes fear of unknown since he did not have warning s/s prior to his event. Pt is concerned about his decreased energy level which effects his ability to perform yard work as prior to his event. Although pt reports he was able to do yard work for 5 hours this weekend, but in the past he had stamina for 8 hours.  Pt is eager to get back using his chainsaw on small trees. Pt advised to discuss this with his cardiologist prior to use. Pt also encouraged to slowly increase physical activities as strength and stamina improve with cardiac rehab actitivities.   Offered emotional support and reassurance.  Will continue to monitor and intervene as necessary.

## 2016-02-07 ENCOUNTER — Encounter (HOSPITAL_COMMUNITY)
Admission: RE | Admit: 2016-02-07 | Discharge: 2016-02-07 | Disposition: A | Discharge status: Home / Self Care | Payer: Medicare Other | Source: Ambulatory Visit | Attending: Cardiology | Admitting: Cardiology

## 2016-02-07 DIAGNOSIS — I213 ST elevation (STEMI) myocardial infarction of unspecified site: Secondary | ICD-10-CM | POA: Insufficient documentation

## 2016-02-07 DIAGNOSIS — Z9861 Coronary angioplasty status: Secondary | ICD-10-CM | POA: Insufficient documentation

## 2016-02-07 LAB — GLUCOSE, CAPILLARY: GLUCOSE-CAPILLARY: 90 mg/dL (ref 65–99)

## 2016-02-07 NOTE — Progress Notes (Addendum)
Daily Session Note  Patient Details  Name: Alan Lopez MRN: [id number] Date of Birth: 01/25/1946 Referring Provider:  Fulp, Cammie, MD  Encounter Date: 02/07/2016  Check In:     Session Check In - 02/07/16 0855    Check-In   Location [facility]   Staff Present Carlette Carlton, RN, BSN;Amber Fair, MS, ACSM RCEP, Exercise Physiologist;Jessica Hawkins, MA, ACSM RCEP, Exercise Physiologist;Joann Rion, RN, BSN   Supervising physician immediately available to respond to emergencies [facility] immediately available   Physician(s) Dr. Merrell   Medication changes reported     Yes   Comments stopped januvia and started insulin   Fall or balance concerns reported    No   Warm-up and Cool-down Performed as group-led instruction   Resistance Training Performed No   VAD Patient? No   Pain Assessment   Currently in Pain? No/denies      Capillary Blood Glucose: Results for orders placed or performed during the hospital encounter of 02/07/16 (from the past 24 hour(s))  Glucose, capillary     Status: None   Collection Time: 02/07/16  9:19 AM  Result Value Ref Range   Glucose-Capillary 90 65 - 99 mg/dL    Pre exercise CBG  (reported home) Post exercise CBG:     Goals Met:  Independence with exercise equipment Changing diet to healthy choices, watching portion sizes Exercise tolerated well No report of cardiac concerns or symptoms  Goals Unmet:  Not Applicable  Comments: pt arrived at cardiac rehab reporting change to his medication regimen. Pt states he has stopped januvia and glipizide and started toujeo per Dr. Kumar recommendation.  Med list reconciled.  Will continue to monitor   Dr. Traci Turner is Medical Director for Cardiac Rehab at [facility]. 

## 2016-02-09 ENCOUNTER — Encounter (HOSPITAL_COMMUNITY)
Admission: RE | Admit: 2016-02-09 | Discharge: 2016-02-09 | Disposition: A | Discharge status: Home / Self Care | Payer: Medicare Other | Source: Ambulatory Visit | Attending: Cardiology | Admitting: Cardiology

## 2016-02-09 DIAGNOSIS — Z9861 Coronary angioplasty status: Secondary | ICD-10-CM | POA: Diagnosis not present

## 2016-02-09 DIAGNOSIS — I213 ST elevation (STEMI) myocardial infarction of unspecified site: Secondary | ICD-10-CM | POA: Diagnosis not present

## 2016-02-09 LAB — GLUCOSE, CAPILLARY: GLUCOSE-CAPILLARY: 115 mg/dL — AB (ref 65–99)

## 2016-02-09 NOTE — Progress Notes (Signed)
Reviewed home exercise with pt today.  Pt plans to walk for exercise and post d/c will join [facility].  Reviewed THR, pulse, RPE, sign and symptoms, and when to call 911 or MD.  Also discussed weather considerations and indoor options.  Pt voiced understanding.    Jyssica Rief [employer]

## 2016-02-12 ENCOUNTER — Other Ambulatory Visit: Payer: Self-pay | Admitting: *Deleted

## 2016-02-12 ENCOUNTER — Encounter (HOSPITAL_COMMUNITY)
Admission: RE | Admit: 2016-02-12 | Discharge: 2016-02-12 | Disposition: A | Discharge status: Home / Self Care | Payer: Medicare Other | Source: Ambulatory Visit | Attending: Cardiology | Admitting: Cardiology

## 2016-02-12 DIAGNOSIS — Z9861 Coronary angioplasty status: Secondary | ICD-10-CM | POA: Diagnosis not present

## 2016-02-12 DIAGNOSIS — I213 ST elevation (STEMI) myocardial infarction of unspecified site: Secondary | ICD-10-CM | POA: Diagnosis not present

## 2016-02-12 LAB — GLUCOSE, CAPILLARY: GLUCOSE-CAPILLARY: 156 mg/dL — AB (ref 65–99)

## 2016-02-12 MED ORDER — NITROGLYCERIN 0.4 MG SL SUBL: 0.4000 mg | SUBLINGUAL_TABLET | SUBLINGUAL | Status: DC | PRN

## 2016-02-14 ENCOUNTER — Encounter: Payer: Self-pay | Admitting: Cardiology

## 2016-02-14 ENCOUNTER — Encounter (HOSPITAL_COMMUNITY)
Admission: RE | Admit: 2016-02-14 | Discharge: 2016-02-14 | Disposition: A | Discharge status: Home / Self Care | Payer: Medicare Other | Source: Ambulatory Visit | Attending: Cardiology | Admitting: Cardiology

## 2016-02-14 ENCOUNTER — Ambulatory Visit (INDEPENDENT_AMBULATORY_CARE_PROVIDER_SITE_OTHER): Payer: Medicare Other | Admitting: Cardiology

## 2016-02-14 VITALS — BP 114/70 | HR 70 | Ht 71.0 in | Wt 184.8 lb

## 2016-02-14 DIAGNOSIS — I214 Non-ST elevation (NSTEMI) myocardial infarction: Secondary | ICD-10-CM | POA: Diagnosis not present

## 2016-02-14 DIAGNOSIS — E118 Type 2 diabetes mellitus with unspecified complications: Secondary | ICD-10-CM | POA: Insufficient documentation

## 2016-02-14 DIAGNOSIS — Z9861 Coronary angioplasty status: Secondary | ICD-10-CM

## 2016-02-14 DIAGNOSIS — I2583 Coronary atherosclerosis due to lipid rich plaque: Secondary | ICD-10-CM

## 2016-02-14 DIAGNOSIS — E785 Hyperlipidemia, unspecified: Secondary | ICD-10-CM

## 2016-02-14 DIAGNOSIS — I2510 Atherosclerotic heart disease of native coronary artery without angina pectoris: Secondary | ICD-10-CM | POA: Diagnosis not present

## 2016-02-14 DIAGNOSIS — E08311 Diabetes mellitus due to underlying condition with unspecified diabetic retinopathy with macular edema: Secondary | ICD-10-CM

## 2016-02-14 DIAGNOSIS — E0865 Diabetes mellitus due to underlying condition with hyperglycemia: Secondary | ICD-10-CM

## 2016-02-14 DIAGNOSIS — I213 ST elevation (STEMI) myocardial infarction of unspecified site: Secondary | ICD-10-CM | POA: Diagnosis not present

## 2016-02-14 NOTE — Progress Notes (Signed)
Cardiology Office Note    Date:  02/14/2016   ID:  Alan Lopez, DOB 1946/01/29, MRN [id number]  PCP:  Antony Blackbird, MD  Cardiologist:   Candee Furbish, MD     History of Present Illness:  Alan Lopez is a 70 y.o. male with history of non-ST elevation myocardial infarction, hypertension, tobacco use, hyperlipidemia, diabetes here for heart evaluation.  He was in [place] at [facility] and I reviewed consultation from 12/12/15 by Dr. Alla German of cardiology. Had chest discomfort, general uneasiness followed by left-sided chest discomfort. Pressure. No radiation to arms or jaw. He recently visited family in [place] at altitude and had no difficulty.  His EKG showed junctional bradycardia with lateral ST and T-wave inversion. Echocardiogram showed mild inferior base height both kidney cyst. Troponin was 5.7 initially. EKG personally viewed.  He underwent cardiac catheterization on 12/12/15 where he had severe disease of his right coronary artery and successful DES placement to the right coronary artery. Transient no reflow. LV function appeared preserved. Left ventricular end-diastolic pressure was 16 mmHg.  Feeling OK. Brilinta dyspnea.  Since changing to Plavix doing better. Little tired outside. 4hrs in yard. He is enjoying his cardiac rehabilitation. No chest pain, no shortness of breath, no syncope. No bleeding.  Past Medical History  Diagnosis Date  . Hyperlipidemia   . HTN (hypertension)   . Hypertrophy (benign) of prostate   . Tobacco abuse   . Status post non-ST elevation myocardial infarction (NSTEMI)     CATH  . Non-insulin dependent type 2 diabetes mellitus ([place])   . Multinodular goiter 04/12/2015  . 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 Type II diabetes mellitus, uncontrolled ([place]) 04/12/2015    Past Surgical History  Procedure Laterality Date  . Coronary angioplasty with stent placement      Outpatient Prescriptions Prior to Visit  Medication  Sig Dispense Refill  . aspirin EC 81 MG tablet Take 81 mg by mouth daily.    Marland Kitchen atorvastatin (LIPITOR) 80 MG tablet Take 80 mg by mouth daily.    Marland Kitchen BAYER MICROLET LANCETS lancets Use as instructed to check blood sugar 2 times per day dx code E11.65 100 each 3  . calcium carbonate (TUMS - DOSED IN MG ELEMENTAL CALCIUM) 500 MG chewable tablet Chew 1 tablet by mouth daily as needed.     . cholecalciferol (VITAMIN D) 1000 UNITS tablet Take 1,000 Units by mouth daily.    . clopidogrel (PLAVIX) 75 MG tablet Take 1 tablet (75 mg total) by mouth daily. 90 tablet 3  . fexofenadine (ALLEGRA) 180 MG tablet Take 180 mg by mouth daily as needed for allergies (for seasonal allergies). Reported on 01/29/2016    . finasteride (PROSCAR) 5 MG tablet Take 5 mg by mouth daily.  3  . fluticasone (FLONASE) 50 MCG/ACT nasal spray Place 2 sprays into both nostrils daily as needed for allergies (for seasonal allergies). Reported on 01/29/2016    . glucose blood (BAYER CONTOUR NEXT TEST) test strip Use as instructed to check blood sugar 2 times per day dx code E11.65 100 each 3  . Insulin Glargine (TOUJEO SOLOSTAR) 300 UNIT/ML SOPN Inject 6 Units into the skin at bedtime. 4.5 mL 2  . Insulin Pen Needle 32G X 4 MM MISC Use one per day to inject insulin 30 each 2  . losartan (COZAAR) 25 MG tablet Take 0.5 tablets (12.5 mg total) by mouth daily. 45 tablet 3  . metFORMIN (GLUCOPHAGE-XR) 500 MG 24 hr tablet  TAKE 2 TABLETS BY MOUTH MORNING AND EVENING 120 tablet 3  . metoprolol tartrate (LOPRESSOR) 25 MG tablet Take 25 mg by mouth 2 (two) times daily.    . Multiple Vitamin (MULTIVITAMIN WITH MINERALS) TABS tablet Take 1 tablet by mouth daily.    . pioglitazone (ACTOS) 15 MG tablet TAKE 1 TABLET(15 MG) BY MOUTH DAILY 30 tablet 3  . glipiZIDE (GLUCOTROL XL) 2.5 MG 24 hr tablet Take 1 tablet (2.5 mg total) by mouth daily with breakfast. (Patient not taking: Reported on 02/07/2016) 90 tablet 2  . nitroGLYCERIN (NITROSTAT) 0.4 MG SL  tablet Place 1 tablet (0.4 mg total) under the tongue every 5 (five) minutes as needed for chest pain. 25 tablet prn  . pantoprazole (PROTONIX) 40 MG tablet Take 40 mg by mouth daily. Reported on 01/25/2016    . sitaGLIPtin (JANUVIA) 100 MG tablet Take 1 tablet (100 mg total) by mouth daily. (Patient not taking: Reported on 02/07/2016) 30 tablet 3   No facility-administered medications prior to visit.     Allergies:   Codeine   Social History   Social History  . Marital Status: Married    Spouse Name: N/A  . Number of Children: N/A  . Years of Education: N/A   Social History Main Topics  . Smoking status: Former Smoker    Quit date: 12/11/2015  . Smokeless tobacco: Never Used  . Alcohol Use: None  . Drug Use: None  . Sexual Activity: Not Asked   Other Topics Concern  . None   Social History Narrative     Family History:  The patient's family history includes Cancer in his brother; Diabetes in his brother and mother. There is no history of Heart disease.   ROS:   Please see the history of present illness.    ROS easy bruising from dual antiplatelet therapy. No recent chest pain, he is feeling some shortness of breath after Brilinta. No strokelike symptoms, no significant orthopnea. All other systems reviewed and are negative.   PHYSICAL EXAM:   VS:  BP 114/70 mmHg  Pulse 70  Ht 5\' 11"  (1.803 m)  Wt 184 lb 12.8 oz (83.825 kg)  BMI 25.79 kg/m2   GEN: Well nourished, well developed, in no acute distress HEENT: normal Neck: no JVD, carotid bruits, or masses Cardiac: RRR; no murmurs, rubs, or gallops,no edema  Respiratory:  clear to auscultation bilaterally, normal work of breathing GI: soft, nontender, nondistended, + BS MS: no deformity or atrophy Skin: warm and dry, no rash Neuro:  Alert and Oriented x 3, Strength and sensation are intact Psych: euthymic mood, full affect  Wt Readings from Last 3 Encounters:  02/14/16 184 lb 12.8 oz (83.825 kg)  01/25/16 186 lb 8.2  oz (84.6 kg)  01/17/16 181 lb 6.4 oz (82.283 kg)      Studies/Labs Reviewed:   EKG:  12/11/15 shows accelerated junctional rhythm with deep T-wave inversion in 1, aVL, V2 as well as T-wave inversion in V4 through V6. Heart rate 86 bpm.  Recent Labs: 10/31/2015: ALT 34 01/03/2016: BUN 17; Creatinine, Ser 0.85; Potassium 4.5; Sodium 139   Lipid Panel    Component Value Date/Time   CHOL 120 10/31/2015 0857   TRIG 72.0 10/31/2015 0857   HDL 56.40 10/31/2015 0857   CHOLHDL 2 10/31/2015 0857   VLDL 14.4 10/31/2015 0857   LDLCALC 49 10/31/2015 0857    Additional studies/ records that were reviewed today include:  Records from [facility], cardiac catheterization, EKG, lab  work reviewed, consultation reviewed    ASSESSMENT:    1. NSTEMI (non-ST elevated myocardial infarction) ([place])   2. Coronary artery disease due to lipid rich plaque   3. Hyperlipidemia   4. S/P PTCA (percutaneous transluminal coronary angioplasty)      PLAN:  In order of problems listed above:  Non-ST elevation myocardial infarction  - 12/12/15  - RCA DES  - EF, mild mitral regurgitation, mild tricuspid regurgitation    - Basal inferior hypokinesis.  - Markedly abnormal T-wave inversion on EKG. These EKG changes resolved on subsequent EKG. Personally viewed.  - Given his dyspnea that seems to be temporally related to Brilinta administration, we will changed him over to Plavix after one month of Brilinta is completed. We  loaded him with 300 milligrams then 75 mg thereafter.  - Continue dual antiplatelet therapy for one year.  - Start slowly with yard work at home. He enjoys this.  - He is enjoying cardiac rehabilitation  Coronary artery disease  - Right coronary artery stenting-80-90% lesion of RCA. Single DES.  - First diagonal ostial 40%.  - Aggressive secondary prevention  - Beta blocker, ARB (taking low dose for renal protection)  Hyperlipidemia  - Atorvastatin  decrease to 40 mg.  - total  cholesterol 117, triglycerides 150, LDL 38, HDL 48,  Diabetes with cardiac complications  - 123456 7.4  - Per primary, Dr. Chapman Fitch  - Has blindness in one of his eyes, diabetic complication. He is looking for a new eye provider.    Medication Adjustments/Labs and Tests Ordered: Current medicines are reviewed at length with the patient today.  Concerns regarding medicines are outlined above.  Medication changes, Labs and Tests ordered today are listed in the Patient Instructions below. There are no Patient Instructions on file for this visit.     Bobby Rumpf, MD  02/14/2016 9:36 AM    [address], [place], [place]  [address] Phone: [phone]; Fax: [phone]

## 2016-02-14 NOTE — Patient Instructions (Signed)
Medication Instructions:  DECREASE ATORVASTATIN  TO 40 MG  EVERY DAY   Labwork: NONE  Testing/Procedures: NONE  Follow-Up: Your physician wants you to follow-up in: [place] will receive a reminder letter in the mail two months in advance. If you don't receive a letter, please call our office to schedule the follow-up appointment. Any Other Special Instructions Will Be Listed Below (If Applicable).     If you need a refill on your cardiac medications before your next appointment, please call your pharmacy.

## 2016-02-16 ENCOUNTER — Encounter (HOSPITAL_COMMUNITY)
Admission: RE | Admit: 2016-02-16 | Discharge: 2016-02-16 | Disposition: A | Discharge status: Home / Self Care | Payer: Medicare Other | Source: Ambulatory Visit | Attending: Cardiology | Admitting: Cardiology

## 2016-02-16 DIAGNOSIS — I213 ST elevation (STEMI) myocardial infarction of unspecified site: Secondary | ICD-10-CM | POA: Diagnosis not present

## 2016-02-16 DIAGNOSIS — Z9861 Coronary angioplasty status: Secondary | ICD-10-CM | POA: Diagnosis not present

## 2016-02-16 NOTE — Progress Notes (Signed)
Alan Lopez 70 y.o. male Nutrition Note Spoke with pt. Nutrition Plan and Nutrition Survey goals reviewed with pt. Pt is following Step 1 of the Therapeutic Lifestyle Changes diet. Barriers to further dietary changes include pt's wife restarting chemo. Pt is diabetic. Last A1c indicates blood glucose not optimally controlled. Pt reports starting insulin "about a week ago." Pt feels like he's managing using his insulin "pretty well so far. I'm titrating my insulin up every 3 days." This writer went over Diabetes Education test results. Pt checks CBG's once a day. Fasting CBG's reportedly 152-190 mg/dL. Pt recently quit tobacco use and reports he's doing OK "but I think about smoking all the time." Pt states he has maintained his wt since stopping tobacco use, which pt appl Pt expressed understanding of the information reviewed. Pt aware of nutrition education classes offered.  Lab Results  Component Value Date   HGBA1C 7.5* 10/31/2015   Wt Readings from Last 3 Encounters:  02/14/16 184 lb 12.8 oz (83.825 kg)  01/25/16 186 lb 8.2 oz (84.6 kg)  01/17/16 181 lb 6.4 oz (82.283 kg)    Nutrition Diagnosis ? Food-and nutrition-related knowledge deficit related to lack of exposure to information as related to diagnosis of: ? CVD ? DM ? Overweight related to excessive energy intake as evidenced by a BMI of 26.4  Nutrition RX/ Estimated Daily Nutrition Needs for: wt loss 1450-1950 Kcal, 40-50 gm fat, 9-13 gm sat fat, 1.4-1.9 gm trans-fat, <1500 mg sodium, 175-250 gm CHO   Nutrition Intervention ? Pt's individual nutrition plan reviewed with pt. ? Benefits of adopting Therapeutic Lifestyle Changes discussed when Medficts reviewed. ? Pt to attend the Portion Distortion class ? Pt to attend the Diabetes Q & A class ? Pt to attend the   ? Nutrition I class                  ? Nutrition II class     ? Diabetes Blitz class ? Pt given handouts for: ? Nutrition I class ? Nutrition II  class ? Continue client-centered nutrition education by RD, as part of interdisciplinary care. Goal(s) ? Pt to identify and limit food sources of saturated fat, trans fat, and sodium ? Pt to identify food quantities necessary to achieve weight loss of 6-24 lb (2.7-10.9 kg) at graduation from cardiac rehab.  ? CBG concentrations in the normal range or as close to normal as is safely possible. Monitor and Evaluate progress toward nutrition goal with team. Derek Mound, M.Ed, RD, LDN, CDE 02/16/2016 10:33 AM

## 2016-02-19 ENCOUNTER — Encounter (HOSPITAL_COMMUNITY)
Admission: RE | Admit: 2016-02-19 | Discharge: 2016-02-19 | Disposition: A | Discharge status: Home / Self Care | Payer: Medicare Other | Source: Ambulatory Visit | Attending: Cardiology | Admitting: Cardiology

## 2016-02-19 DIAGNOSIS — I213 ST elevation (STEMI) myocardial infarction of unspecified site: Secondary | ICD-10-CM | POA: Diagnosis not present

## 2016-02-19 DIAGNOSIS — Z9861 Coronary angioplasty status: Secondary | ICD-10-CM | POA: Diagnosis not present

## 2016-02-20 DIAGNOSIS — E119 Type 2 diabetes mellitus without complications: Secondary | ICD-10-CM | POA: Diagnosis not present

## 2016-02-20 DIAGNOSIS — Z794 Long term (current) use of insulin: Secondary | ICD-10-CM | POA: Diagnosis not present

## 2016-02-20 DIAGNOSIS — E042 Nontoxic multinodular goiter: Secondary | ICD-10-CM | POA: Diagnosis not present

## 2016-02-20 DIAGNOSIS — I2510 Atherosclerotic heart disease of native coronary artery without angina pectoris: Secondary | ICD-10-CM | POA: Diagnosis not present

## 2016-02-20 DIAGNOSIS — Z7901 Long term (current) use of anticoagulants: Secondary | ICD-10-CM | POA: Diagnosis not present

## 2016-02-20 DIAGNOSIS — Z79899 Other long term (current) drug therapy: Secondary | ICD-10-CM | POA: Diagnosis not present

## 2016-02-21 ENCOUNTER — Encounter (HOSPITAL_COMMUNITY)
Admission: RE | Admit: 2016-02-21 | Discharge: 2016-02-21 | Disposition: A | Discharge status: Home / Self Care | Payer: Medicare Other | Source: Ambulatory Visit | Attending: Cardiology | Admitting: Cardiology

## 2016-02-21 DIAGNOSIS — Z9861 Coronary angioplasty status: Secondary | ICD-10-CM | POA: Diagnosis not present

## 2016-02-21 DIAGNOSIS — I213 ST elevation (STEMI) myocardial infarction of unspecified site: Secondary | ICD-10-CM | POA: Diagnosis not present

## 2016-02-23 ENCOUNTER — Encounter (HOSPITAL_COMMUNITY)
Admission: RE | Admit: 2016-02-23 | Discharge: 2016-02-23 | Disposition: A | Discharge status: Home / Self Care | Payer: Medicare Other | Source: Ambulatory Visit | Attending: Cardiology | Admitting: Cardiology

## 2016-02-23 DIAGNOSIS — Z9861 Coronary angioplasty status: Secondary | ICD-10-CM | POA: Diagnosis not present

## 2016-02-23 DIAGNOSIS — I213 ST elevation (STEMI) myocardial infarction of unspecified site: Secondary | ICD-10-CM | POA: Diagnosis not present

## 2016-02-24 ENCOUNTER — Other Ambulatory Visit: Payer: Self-pay | Admitting: Endocrinology

## 2016-02-26 ENCOUNTER — Encounter (HOSPITAL_COMMUNITY)
Admission: RE | Admit: 2016-02-26 | Discharge: 2016-02-26 | Disposition: A | Discharge status: Home / Self Care | Payer: Medicare Other | Source: Ambulatory Visit | Attending: Cardiology | Admitting: Cardiology

## 2016-02-26 DIAGNOSIS — I213 ST elevation (STEMI) myocardial infarction of unspecified site: Secondary | ICD-10-CM | POA: Diagnosis not present

## 2016-02-26 DIAGNOSIS — Z9861 Coronary angioplasty status: Secondary | ICD-10-CM | POA: Diagnosis not present

## 2016-02-27 DIAGNOSIS — Z7901 Long term (current) use of anticoagulants: Secondary | ICD-10-CM | POA: Diagnosis not present

## 2016-02-27 DIAGNOSIS — Z7984 Long term (current) use of oral hypoglycemic drugs: Secondary | ICD-10-CM | POA: Diagnosis not present

## 2016-02-27 DIAGNOSIS — I2510 Atherosclerotic heart disease of native coronary artery without angina pectoris: Secondary | ICD-10-CM | POA: Diagnosis not present

## 2016-02-27 DIAGNOSIS — E119 Type 2 diabetes mellitus without complications: Secondary | ICD-10-CM | POA: Diagnosis not present

## 2016-02-28 ENCOUNTER — Encounter (HOSPITAL_COMMUNITY)
Admission: RE | Admit: 2016-02-28 | Discharge: 2016-02-28 | Disposition: A | Discharge status: Home / Self Care | Payer: Medicare Other | Source: Ambulatory Visit | Attending: Cardiology | Admitting: Cardiology

## 2016-02-28 DIAGNOSIS — I213 ST elevation (STEMI) myocardial infarction of unspecified site: Secondary | ICD-10-CM | POA: Diagnosis not present

## 2016-02-28 DIAGNOSIS — Z9861 Coronary angioplasty status: Secondary | ICD-10-CM | POA: Diagnosis not present

## 2016-02-28 NOTE — Progress Notes (Signed)
No psychosocial needs identified, no intervention necessary. Pt is exercising on his own at home.  Pt is also very active doing heavy yard work. Pt cautioned to take rest breaks as necessary and maintain hydration with heavy exertion. Understanding verbalized.

## 2016-03-01 ENCOUNTER — Encounter (HOSPITAL_COMMUNITY)
Admission: RE | Admit: 2016-03-01 | Discharge: 2016-03-01 | Disposition: A | Discharge status: Home / Self Care | Payer: Medicare Other | Source: Ambulatory Visit | Attending: Cardiology | Admitting: Cardiology

## 2016-03-01 DIAGNOSIS — Z9861 Coronary angioplasty status: Secondary | ICD-10-CM | POA: Diagnosis not present

## 2016-03-01 DIAGNOSIS — I213 ST elevation (STEMI) myocardial infarction of unspecified site: Secondary | ICD-10-CM | POA: Diagnosis not present

## 2016-03-04 ENCOUNTER — Encounter (HOSPITAL_COMMUNITY)
Admission: RE | Admit: 2016-03-04 | Discharge: 2016-03-04 | Disposition: A | Discharge status: Home / Self Care | Payer: Medicare Other | Source: Ambulatory Visit | Attending: Cardiology | Admitting: Cardiology

## 2016-03-04 DIAGNOSIS — Z9861 Coronary angioplasty status: Secondary | ICD-10-CM | POA: Diagnosis not present

## 2016-03-04 DIAGNOSIS — I213 ST elevation (STEMI) myocardial infarction of unspecified site: Secondary | ICD-10-CM | POA: Diagnosis not present

## 2016-03-06 ENCOUNTER — Encounter (HOSPITAL_COMMUNITY)
Admission: RE | Admit: 2016-03-06 | Discharge: 2016-03-06 | Disposition: A | Discharge status: Home / Self Care | Payer: Medicare Other | Source: Ambulatory Visit | Attending: Cardiology | Admitting: Cardiology

## 2016-03-06 DIAGNOSIS — Z9861 Coronary angioplasty status: Secondary | ICD-10-CM | POA: Diagnosis not present

## 2016-03-06 DIAGNOSIS — I213 ST elevation (STEMI) myocardial infarction of unspecified site: Secondary | ICD-10-CM | POA: Diagnosis not present

## 2016-03-08 ENCOUNTER — Encounter (HOSPITAL_COMMUNITY)
Admission: RE | Admit: 2016-03-08 | Discharge: 2016-03-08 | Disposition: A | Discharge status: Home / Self Care | Payer: Medicare Other | Source: Ambulatory Visit | Attending: Cardiology | Admitting: Cardiology

## 2016-03-08 DIAGNOSIS — Z9861 Coronary angioplasty status: Secondary | ICD-10-CM | POA: Diagnosis not present

## 2016-03-08 DIAGNOSIS — I213 ST elevation (STEMI) myocardial infarction of unspecified site: Secondary | ICD-10-CM | POA: Diagnosis not present

## 2016-03-11 ENCOUNTER — Encounter (HOSPITAL_COMMUNITY)
Admission: RE | Admit: 2016-03-11 | Discharge: 2016-03-11 | Disposition: A | Discharge status: Home / Self Care | Payer: Medicare Other | Source: Ambulatory Visit | Attending: Cardiology | Admitting: Cardiology

## 2016-03-11 DIAGNOSIS — Z9861 Coronary angioplasty status: Secondary | ICD-10-CM | POA: Diagnosis not present

## 2016-03-11 DIAGNOSIS — I213 ST elevation (STEMI) myocardial infarction of unspecified site: Secondary | ICD-10-CM | POA: Diagnosis not present

## 2016-03-13 ENCOUNTER — Encounter (HOSPITAL_COMMUNITY)
Admission: RE | Admit: 2016-03-13 | Discharge: 2016-03-13 | Disposition: A | Discharge status: Home / Self Care | Payer: Medicare Other | Source: Ambulatory Visit | Attending: Cardiology | Admitting: Cardiology

## 2016-03-13 DIAGNOSIS — I213 ST elevation (STEMI) myocardial infarction of unspecified site: Secondary | ICD-10-CM | POA: Diagnosis not present

## 2016-03-13 DIAGNOSIS — Z9861 Coronary angioplasty status: Secondary | ICD-10-CM | POA: Diagnosis not present

## 2016-03-15 ENCOUNTER — Other Ambulatory Visit: Payer: Self-pay | Admitting: Cardiology

## 2016-03-15 ENCOUNTER — Encounter (HOSPITAL_COMMUNITY)
Admission: RE | Admit: 2016-03-15 | Discharge: 2016-03-15 | Disposition: A | Discharge status: Home / Self Care | Payer: Medicare Other | Source: Ambulatory Visit | Attending: Cardiology | Admitting: Cardiology

## 2016-03-15 DIAGNOSIS — Z9861 Coronary angioplasty status: Secondary | ICD-10-CM | POA: Diagnosis not present

## 2016-03-15 DIAGNOSIS — I213 ST elevation (STEMI) myocardial infarction of unspecified site: Secondary | ICD-10-CM | POA: Diagnosis not present

## 2016-03-18 ENCOUNTER — Encounter (HOSPITAL_COMMUNITY)
Admission: RE | Admit: 2016-03-18 | Discharge: 2016-03-18 | Disposition: A | Discharge status: Home / Self Care | Payer: Medicare Other | Source: Ambulatory Visit | Attending: Cardiology | Admitting: Cardiology

## 2016-03-18 DIAGNOSIS — I213 ST elevation (STEMI) myocardial infarction of unspecified site: Secondary | ICD-10-CM | POA: Diagnosis not present

## 2016-03-18 DIAGNOSIS — Z9861 Coronary angioplasty status: Secondary | ICD-10-CM | POA: Diagnosis not present

## 2016-03-20 ENCOUNTER — Encounter (HOSPITAL_COMMUNITY)
Admission: RE | Admit: 2016-03-20 | Discharge: 2016-03-20 | Disposition: A | Discharge status: Home / Self Care | Payer: Medicare Other | Source: Ambulatory Visit | Attending: Cardiology | Admitting: Cardiology

## 2016-03-20 DIAGNOSIS — Z9861 Coronary angioplasty status: Secondary | ICD-10-CM | POA: Diagnosis not present

## 2016-03-20 DIAGNOSIS — I213 ST elevation (STEMI) myocardial infarction of unspecified site: Secondary | ICD-10-CM | POA: Diagnosis not present

## 2016-03-22 ENCOUNTER — Encounter (HOSPITAL_COMMUNITY)
Admission: RE | Admit: 2016-03-22 | Discharge: 2016-03-22 | Disposition: A | Discharge status: Home / Self Care | Payer: Medicare Other | Source: Ambulatory Visit | Attending: Cardiology | Admitting: Cardiology

## 2016-03-22 DIAGNOSIS — I213 ST elevation (STEMI) myocardial infarction of unspecified site: Secondary | ICD-10-CM | POA: Diagnosis not present

## 2016-03-22 DIAGNOSIS — Z9861 Coronary angioplasty status: Secondary | ICD-10-CM | POA: Diagnosis not present

## 2016-03-25 ENCOUNTER — Encounter (HOSPITAL_COMMUNITY): Payer: Medicare Other

## 2016-03-27 ENCOUNTER — Encounter (HOSPITAL_COMMUNITY): Payer: Medicare Other

## 2016-03-29 ENCOUNTER — Encounter (HOSPITAL_COMMUNITY): Payer: Medicare Other

## 2016-04-01 ENCOUNTER — Encounter (HOSPITAL_COMMUNITY)
Admission: RE | Admit: 2016-04-01 | Discharge: 2016-04-01 | Disposition: A | Discharge status: Home / Self Care | Payer: Medicare Other | Source: Ambulatory Visit | Attending: Cardiology | Admitting: Cardiology

## 2016-04-01 DIAGNOSIS — Z9861 Coronary angioplasty status: Secondary | ICD-10-CM | POA: Diagnosis not present

## 2016-04-01 DIAGNOSIS — I213 ST elevation (STEMI) myocardial infarction of unspecified site: Secondary | ICD-10-CM | POA: Diagnosis not present

## 2016-04-01 NOTE — Progress Notes (Signed)
Pt returned to rehab today after absence to visit his brother during kidney transplant in [place].  Pt is doing well.  No psychosocial needs identified, no psychosocial interventions necessary.  Pt is exercising on his own.

## 2016-04-03 ENCOUNTER — Encounter (HOSPITAL_COMMUNITY)
Admission: RE | Admit: 2016-04-03 | Discharge: 2016-04-03 | Disposition: A | Discharge status: Home / Self Care | Payer: Medicare Other | Source: Ambulatory Visit | Attending: Cardiology | Admitting: Cardiology

## 2016-04-03 DIAGNOSIS — Z9861 Coronary angioplasty status: Secondary | ICD-10-CM | POA: Diagnosis not present

## 2016-04-03 DIAGNOSIS — I213 ST elevation (STEMI) myocardial infarction of unspecified site: Secondary | ICD-10-CM | POA: Diagnosis not present

## 2016-04-05 ENCOUNTER — Encounter (HOSPITAL_COMMUNITY): Payer: Medicare Other

## 2016-04-08 ENCOUNTER — Encounter (HOSPITAL_COMMUNITY)
Admission: RE | Admit: 2016-04-08 | Discharge: 2016-04-08 | Disposition: A | Discharge status: Home / Self Care | Payer: Medicare Other | Source: Ambulatory Visit | Attending: Cardiology | Admitting: Cardiology

## 2016-04-08 DIAGNOSIS — I213 ST elevation (STEMI) myocardial infarction of unspecified site: Secondary | ICD-10-CM | POA: Diagnosis not present

## 2016-04-08 DIAGNOSIS — Z9861 Coronary angioplasty status: Secondary | ICD-10-CM | POA: Diagnosis not present

## 2016-04-10 ENCOUNTER — Encounter (HOSPITAL_COMMUNITY)
Admission: RE | Admit: 2016-04-10 | Discharge: 2016-04-10 | Disposition: A | Discharge status: Home / Self Care | Payer: Medicare Other | Source: Ambulatory Visit | Attending: Cardiology | Admitting: Cardiology

## 2016-04-10 DIAGNOSIS — I213 ST elevation (STEMI) myocardial infarction of unspecified site: Secondary | ICD-10-CM | POA: Diagnosis not present

## 2016-04-10 DIAGNOSIS — Z9861 Coronary angioplasty status: Secondary | ICD-10-CM | POA: Diagnosis not present

## 2016-04-12 ENCOUNTER — Encounter (HOSPITAL_COMMUNITY)
Admission: RE | Admit: 2016-04-12 | Discharge: 2016-04-12 | Disposition: A | Discharge status: Home / Self Care | Payer: Medicare Other | Source: Ambulatory Visit | Attending: Cardiology | Admitting: Cardiology

## 2016-04-12 DIAGNOSIS — Z9861 Coronary angioplasty status: Secondary | ICD-10-CM | POA: Diagnosis not present

## 2016-04-12 DIAGNOSIS — I213 ST elevation (STEMI) myocardial infarction of unspecified site: Secondary | ICD-10-CM | POA: Diagnosis not present

## 2016-04-14 ENCOUNTER — Other Ambulatory Visit: Payer: Self-pay | Admitting: Endocrinology

## 2016-04-15 ENCOUNTER — Encounter (HOSPITAL_COMMUNITY)
Admission: RE | Admit: 2016-04-15 | Discharge: 2016-04-15 | Disposition: A | Discharge status: Home / Self Care | Payer: Medicare Other | Source: Ambulatory Visit | Attending: Cardiology | Admitting: Cardiology

## 2016-04-15 DIAGNOSIS — Z9861 Coronary angioplasty status: Secondary | ICD-10-CM | POA: Diagnosis not present

## 2016-04-15 DIAGNOSIS — I213 ST elevation (STEMI) myocardial infarction of unspecified site: Secondary | ICD-10-CM | POA: Diagnosis not present

## 2016-04-17 ENCOUNTER — Encounter (HOSPITAL_COMMUNITY)
Admission: RE | Admit: 2016-04-17 | Discharge: 2016-04-17 | Disposition: A | Discharge status: Home / Self Care | Payer: Medicare Other | Source: Ambulatory Visit | Attending: Cardiology | Admitting: Cardiology

## 2016-04-17 DIAGNOSIS — I213 ST elevation (STEMI) myocardial infarction of unspecified site: Secondary | ICD-10-CM | POA: Diagnosis not present

## 2016-04-17 DIAGNOSIS — Z9861 Coronary angioplasty status: Secondary | ICD-10-CM | POA: Diagnosis not present

## 2016-04-19 ENCOUNTER — Encounter (HOSPITAL_COMMUNITY)
Admission: RE | Admit: 2016-04-19 | Discharge: 2016-04-19 | Disposition: A | Discharge status: Home / Self Care | Payer: Medicare Other | Source: Ambulatory Visit | Attending: Cardiology | Admitting: Cardiology

## 2016-04-19 DIAGNOSIS — I213 ST elevation (STEMI) myocardial infarction of unspecified site: Secondary | ICD-10-CM | POA: Diagnosis not present

## 2016-04-19 DIAGNOSIS — Z9861 Coronary angioplasty status: Secondary | ICD-10-CM | POA: Diagnosis not present

## 2016-04-22 ENCOUNTER — Encounter (HOSPITAL_COMMUNITY)
Admission: RE | Admit: 2016-04-22 | Discharge: 2016-04-22 | Disposition: A | Discharge status: Home / Self Care | Payer: Medicare Other | Source: Ambulatory Visit | Attending: Cardiology | Admitting: Cardiology

## 2016-04-22 DIAGNOSIS — I213 ST elevation (STEMI) myocardial infarction of unspecified site: Secondary | ICD-10-CM | POA: Diagnosis not present

## 2016-04-22 DIAGNOSIS — Z9861 Coronary angioplasty status: Secondary | ICD-10-CM | POA: Diagnosis not present

## 2016-04-24 ENCOUNTER — Encounter (HOSPITAL_COMMUNITY)
Admission: RE | Admit: 2016-04-24 | Discharge: 2016-04-24 | Disposition: A | Discharge status: Home / Self Care | Payer: Medicare Other | Source: Ambulatory Visit | Attending: Cardiology | Admitting: Cardiology

## 2016-04-24 DIAGNOSIS — I213 ST elevation (STEMI) myocardial infarction of unspecified site: Secondary | ICD-10-CM | POA: Diagnosis not present

## 2016-04-24 DIAGNOSIS — Z9861 Coronary angioplasty status: Secondary | ICD-10-CM | POA: Diagnosis not present

## 2016-04-26 ENCOUNTER — Encounter (HOSPITAL_COMMUNITY): Payer: Self-pay

## 2016-04-26 ENCOUNTER — Encounter (HOSPITAL_COMMUNITY)
Admission: RE | Admit: 2016-04-26 | Discharge: 2016-04-26 | Disposition: A | Discharge status: Home / Self Care | Payer: Medicare Other | Source: Ambulatory Visit | Attending: Cardiology | Admitting: Cardiology

## 2016-04-26 DIAGNOSIS — I213 ST elevation (STEMI) myocardial infarction of unspecified site: Secondary | ICD-10-CM | POA: Diagnosis not present

## 2016-04-26 DIAGNOSIS — Z9861 Coronary angioplasty status: Secondary | ICD-10-CM | POA: Diagnosis not present

## 2016-04-27 ENCOUNTER — Other Ambulatory Visit: Payer: Self-pay | Admitting: Endocrinology

## 2016-04-29 ENCOUNTER — Other Ambulatory Visit: Payer: Self-pay | Admitting: Endocrinology

## 2016-04-29 ENCOUNTER — Encounter (HOSPITAL_COMMUNITY)
Admission: RE | Admit: 2016-04-29 | Discharge: 2016-04-29 | Disposition: A | Discharge status: Home / Self Care | Payer: Medicare Other | Source: Ambulatory Visit | Attending: Cardiology | Admitting: Cardiology

## 2016-04-29 DIAGNOSIS — Z9861 Coronary angioplasty status: Secondary | ICD-10-CM | POA: Diagnosis not present

## 2016-04-29 DIAGNOSIS — I213 ST elevation (STEMI) myocardial infarction of unspecified site: Secondary | ICD-10-CM | POA: Diagnosis not present

## 2016-05-01 ENCOUNTER — Encounter (HOSPITAL_COMMUNITY): Payer: Medicare Other

## 2016-05-03 ENCOUNTER — Encounter (HOSPITAL_COMMUNITY): Admission: RE | Admit: 2016-05-03 | Payer: Medicare Other | Source: Ambulatory Visit

## 2016-05-19 ENCOUNTER — Other Ambulatory Visit: Payer: Self-pay | Admitting: Endocrinology

## 2016-05-20 ENCOUNTER — Other Ambulatory Visit: Payer: Self-pay | Admitting: Endocrinology

## 2016-05-29 ENCOUNTER — Other Ambulatory Visit (INDEPENDENT_AMBULATORY_CARE_PROVIDER_SITE_OTHER): Payer: Medicare Other

## 2016-05-29 ENCOUNTER — Other Ambulatory Visit: Payer: Self-pay | Admitting: Endocrinology

## 2016-05-29 DIAGNOSIS — E1165 Type 2 diabetes mellitus with hyperglycemia: Secondary | ICD-10-CM

## 2016-05-29 LAB — HEMOGLOBIN A1C: HEMOGLOBIN A1C: 9.4 % — AB (ref 4.6–6.5)

## 2016-05-29 LAB — COMPREHENSIVE METABOLIC PANEL
ALK PHOS: 49 U/L (ref 39–117)
ALT: 34 U/L (ref 0–53)
AST: 18 U/L (ref 0–37)
Albumin: 3.9 g/dL (ref 3.5–5.2)
BILIRUBIN TOTAL: 0.6 mg/dL (ref 0.2–1.2)
BUN: 19 mg/dL (ref 6–23)
CO2: 23 mEq/L (ref 19–32)
CREATININE: 0.76 mg/dL (ref 0.40–1.50)
Calcium: 9.4 mg/dL (ref 8.4–10.5)
Chloride: 106 mEq/L (ref 96–112)
GFR: 107.37 mL/min (ref >60.00)
GLUCOSE: 194 mg/dL — AB (ref 70–99)
Potassium: 4.3 mEq/L (ref 3.5–5.1)
Sodium: 139 mEq/L (ref 135–145)
TOTAL PROTEIN: 6.3 g/dL (ref 6.0–8.3)

## 2016-06-07 ENCOUNTER — Ambulatory Visit (INDEPENDENT_AMBULATORY_CARE_PROVIDER_SITE_OTHER): Payer: Medicare Other | Admitting: Endocrinology

## 2016-06-07 ENCOUNTER — Telehealth: Payer: Self-pay | Admitting: Endocrinology

## 2016-06-07 ENCOUNTER — Encounter: Payer: Self-pay | Admitting: Endocrinology

## 2016-06-07 VITALS — BP 122/74 | HR 44 | Ht 71.0 in | Wt 192.0 lb

## 2016-06-07 DIAGNOSIS — E1165 Type 2 diabetes mellitus with hyperglycemia: Secondary | ICD-10-CM | POA: Diagnosis not present

## 2016-06-07 DIAGNOSIS — Z794 Long term (current) use of insulin: Secondary | ICD-10-CM | POA: Diagnosis not present

## 2016-06-07 MED ORDER — LIRAGLUTIDE 18 MG/3ML ~~LOC~~ SOPN: 1.2000 mg | PEN_INJECTOR | Freq: Every day | SUBCUTANEOUS | Status: DC

## 2016-06-07 MED ORDER — INSULIN LISPRO 100 UNIT/ML (KWIKPEN): PEN_INJECTOR | SUBCUTANEOUS | Status: DC

## 2016-06-07 MED ORDER — PIOGLITAZONE HCL 15 MG PO TABS: ORAL_TABLET | ORAL | Status: DC

## 2016-06-07 MED ORDER — METFORMIN HCL ER 500 MG PO TB24: ORAL_TABLET | ORAL | Status: DC

## 2016-06-07 NOTE — Patient Instructions (Addendum)
Start VICTOZA injection as shown once daily at the same time of the day.  Dial the dose to 0.6 mg on the pen for the first week.   You may inject in the stomach, thigh or arm. You may experience nausea in the first few days which usually goes away.  You will feel fullness of the stomach with starting the medication and should try to keep the portions at meals small.   After 1 week increase the dose to 1.2mg  daily if no nausea present.   If any questions or concerns are present call the office or the [place] helpline at [phone]. Visit http://www.wall.info/ for more useful information  Humalog 6 units at supper, keep after meal readings <180  Check blood sugars on waking up   times a week Also check blood sugars about 2 hours after a meal and do this after different meals by rotation  Recommended blood sugar levels on waking up is 90-130 and about 2 hours after meal is 130-160  Please bring your blood sugar monitor to each visit, thank you

## 2016-06-07 NOTE — Progress Notes (Signed)
Patient ID: Alan Lopez, male   DOB: [birth year], 70 y.o.   MRN: [id number]           Reason for Appointment: Follow-up for Type 2 Diabetes  Referring physician: Fulp  History of Present Illness:          Date of diagnosis of type 2 diabetes mellitus :  1999      Past history:  He had modestly increase blood sugars at diagnosis and was started on metformin Subsequently glipizide was added to improve his control. He thinks that his blood sugars have been mostly poorly controlled over the last several years He had been on glipizide and metformin for years without any adjustment of his dosage of glipizide  Review of his A1c shows that it had ranged from 9-9.5 since 07/2013 Previously his PCP tried him on a sample of Jardiance 10 mg without significant improvement  Recent history:   Oral hypoglycemic drugs the patient is taking are: metformin ER 500 mg, 2 tablets twice a day, Actos 15 mg daily,     He has stopped taking  Trulicity [id number] mg weekly that was started on his initial consultation in 5/16, he was complaining of  abdominal cramps with this  Because of significant hyperglycemia in 2/17 he was started on basal insulin with Toujeo However he says that he was not scheduled for follow-up in and not coming in because of significantly high blood sugars  Current blood sugar patterns and problems identified:  He has increase his insulin gradually but even with taking 16 units his fasting readings are mostly high  He has a few readings in the evenings which are much higher than in the morning, highest 418  A1c has gone up as expected  Does not complain of any unusual fatigue or excessive thirst, still drinking mostly water when the Thursday  He has gained weight because of quitting smoking  Side effects from medications have been: Mild  diarrhea from regular metformin  Compliance with the medical regimen: fair  Hypoglycemia: never   Glucose monitoring:  done once a day  usually or less        Glucometer:   Contour   Blood Glucose readings by monitor download shows   FASTING range 148-227 with average 196 Evening readings 279-418, has 3 readings   Self-care: The diet that the patient has been following is: tries to limit high carbohydrate foods. eating out about 4 times a week and sometimes fast food.     Typical meal intake: Breakfast is recently a protein drink.  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, previously was in cardiac rehabilitation   Weight history: Maximum weight 215 previously  Wt Readings from Last 3 Encounters:  06/07/16 192 lb (87.091 kg)  02/14/16 184 lb 12.8 oz (83.825 kg)  01/25/16 186 lb 8.2 oz (84.6 kg)    Glycemic control: last A1c 9.2 done on 02/13/15 Microalbumin ratio normal in 3/16 from PCP    Lab Results  Component Value Date   HGBA1C 9.4* 05/29/2016   HGBA1C 7.5* 10/31/2015   HGBA1C 7.1* 06/29/2015   Lab Results  Component Value Date   MICROALBUR 1.0 10/31/2015   LDLCALC 49 10/31/2015   CREATININE 0.76 05/29/2016  Medication List       This list is accurate as of: 06/07/16  2:59 PM.  Always use your most recent med list.               aspirin EC 81 MG tablet  Take 81 mg by mouth daily.     atorvastatin 80 MG tablet  Commonly known as:  LIPITOR  TAKE 1 TABLET BY MOUTH EVERY DAY     BAYER MICROLET LANCETS lancets  Use as instructed to check blood sugar 2 times per day dx code E11.65     calcium carbonate 500 MG chewable tablet  Commonly known as:  TUMS - dosed in mg elemental calcium  Chew 1 tablet by mouth daily as needed.     cholecalciferol 1000 units tablet  Commonly known as:  VITAMIN D  Take 1,000 Units by mouth daily.     clopidogrel 75 MG tablet  Commonly known as:  PLAVIX  Take 1 tablet (75 mg total) by mouth daily.      fexofenadine 180 MG tablet  Commonly known as:  ALLEGRA  Take 180 mg by mouth daily as needed for allergies (for seasonal allergies). Reported on 01/29/2016     finasteride 5 MG tablet  Commonly known as:  PROSCAR  Take 5 mg by mouth daily.     fluticasone 50 MCG/ACT nasal spray  Commonly known as:  FLONASE  Place 2 sprays into both nostrils daily as needed for allergies (for seasonal allergies). Reported on 01/29/2016     glucose blood test strip  Commonly known as:  BAYER CONTOUR NEXT TEST  Use as instructed to check blood sugar 2 times per day dx code E11.65     Insulin Glargine 300 UNIT/ML Sopn  Commonly known as:  TOUJEO SOLOSTAR  Inject 6 Units into the skin at bedtime.     insulin lispro 100 UNIT/ML KiwkPen  Commonly known as:  HUMALOG  Inject 6 units before each meal.     Insulin Pen Needle 32G X 4 MM Misc  Use one per day to inject insulin     Liraglutide 18 MG/3ML Sopn  Commonly known as:  VICTOZA  Inject 0.2 mLs (1.2 mg total) into the skin daily.     losartan 25 MG tablet  Commonly known as:  COZAAR  Take 0.5 tablets (12.5 mg total) by mouth daily.     metFORMIN 500 MG 24 hr tablet  Commonly known as:  GLUCOPHAGE-XR  TAKE 2 TABLETS BY MOUTH IN THE MORNING AND IN THE EVENING     metoprolol tartrate 25 MG tablet  Commonly known as:  LOPRESSOR  TAKE 1 TABLET BY MOUTH TWICE DAILY     multivitamin with minerals Tabs tablet  Take 1 tablet by mouth daily.     pioglitazone 15 MG tablet  Commonly known as:  ACTOS  TAKE 1 TABLET BY MOUTH EVERY DAY.        Allergies:  Allergies  Allergen Reactions  . Codeine Other (See Comments)    Patient doesn't like how it makes him feel, "spaced out, not together"    Past Medical History  Diagnosis Date  . Hyperlipidemia   . HTN (hypertension)   . Hypertrophy (benign) of prostate   . Tobacco abuse   . Status post non-ST elevation myocardial infarction (NSTEMI)     CATH  . Non-insulin dependent type 2 diabetes  mellitus ([place])   . Multinodular goiter 04/12/2015  . 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  Type II diabetes mellitus, uncontrolled ([place]) 04/12/2015    Past Surgical History  Procedure Laterality Date  . Coronary angioplasty with stent placement      Family History  Problem Relation Age of Onset  . Diabetes Mother   . Diabetes Brother   . Cancer Brother   . Heart disease Neg Hx     Social History:  reports that he quit smoking about 5 months ago. He has never used smokeless tobacco. His alcohol and drug histories are not on file.    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   THYROID: He was found to have a 2 cm nodule on his left thyroid on his exam but ultrasound showed multinodular goiter with mostly small nodules and the largest nodule on the left side of 1.4 cm has the appearance of a pseudo- nodule Previous TSH normal in 2015   LABS:  No visits with results within 1 Week(s) from this visit. Latest known visit with results is:  Lab on 05/29/2016  Component Date Value Ref Range Status  . Hgb A1c MFr Bld 05/29/2016 9.4* 4.6 - 6.5 % Final   Glycemic Control Guidelines for People with Diabetes:Non Diabetic:  <6%Goal of Therapy: <7%Additional Action Suggested:  >8%   . Sodium 05/29/2016 139  135 - 145 mEq/L Final  . Potassium 05/29/2016 4.3  3.5 - 5.1 mEq/L Final  . Chloride 05/29/2016 106  96 - 112 mEq/L Final  . CO2 05/29/2016 23  19 - 32 mEq/L Final  . Glucose, Bld 05/29/2016 194* 70 - 99 mg/dL Final  . BUN 05/29/2016 19  6 - 23 mg/dL Final  . Creatinine, Ser 05/29/2016 0.76  0.40 - 1.50 mg/dL Final  . Total  Bilirubin 05/29/2016 0.6  0.2 - 1.2 mg/dL Final  . Alkaline Phosphatase 05/29/2016 49  39 - 117 U/L Final  . AST 05/29/2016 18  0 - 37 U/L Final  . ALT 05/29/2016 34  0 - 53 U/L Final  . Total Protein 05/29/2016 6.3  6.0 - 8.3 g/dL Final  . Albumin 05/29/2016 3.9  3.5 - 5.2 g/dL Final  . Calcium 05/29/2016 9.4  8.4 - 10.5 mg/dL Final  . GFR 05/29/2016 107.37  >60.00 mL/min Final    Physical Examination:  BP 122/74 mmHg  Pulse 44  Ht 5\' 11"  (1.803 m)  Wt 192 lb (87.091 kg)  BMI 26.79 kg/m2  SpO2 97%     ASSESSMENT:  Diabetes type 2, uncontrolled  See history of present illness for detailed discussion of his current management, blood sugar patterns and problems identified His blood sugars are poorly controlled now with A1c going over 9% He has not been seen in follow-up since February when he was started on basal insulin He is not benefiting from just doing basal insulin and metformin/Actos  He had done well with Trulicity previously but he is reluctant to try this again Discussed with the patient the nature of GLP-1 drugs, the actions on various organ systems, how they  benefit blood glucose control, as well as the benefit of weight loss and  increase satiety . Explained possible side effects especially nausea and vomiting initially; discussed safety information in package insert.  Described the injection technique and dosage titration of Victoza  starting with 0.6 mg once a day at the same time for the first week and then increasing to 1.2 mg if no symptoms of nausea.  Educational brochure on Victoza and co-pay card given  He will also be needing to take mealtime insulin for now because of marked increase in sugars after supper Today discussed in detail the need for mealtime insulin to cover postprandial spikes, action of mealtime insulin, use of the insulin pen, timing and action of the rapid acting insulin as well as starting dose and dosage titration to target the two-hour reading  of under 180  PLAN:   INSULIN: He will continue Toujeo 16 units Start 6 units of Humalog at suppertime and increase if blood sugars after meals stay over 180 Start Victoza as above, may also need an intermediate dose of 0.9 mg if he has nausea with on 1.2   He was advised to start checking his blood sugars  consistently after meals at least once a day  Continue metformin ER 2000 mg a day  He will stop his Actos when finished  Consistent exercise, start walking if not active outside   Patient Instructions  Start VICTOZA injection as shown once daily at the same time of the day.  Dial the dose to 0.6 mg on the pen for the first week.   You may inject in the stomach, thigh or arm. You may experience nausea in the first few days which usually goes away.  You will feel fullness of the stomach with starting the medication and should try to keep the portions at meals small.   After 1 week increase the dose to 1.2mg  daily if no nausea present.   If any questions or concerns are present call the office or the [place] helpline at [phone]. Visit http://www.wall.info/ for more useful information  Humalog 6 units at supper, keep after meal readings <180  Check blood sugars on waking up   times a week Also check blood sugars about 2 hours after a meal and do this after different meals by rotation  Recommended blood sugar levels on waking up is 90-130 and about 2 hours after meal is 130-160  Please bring your blood sugar monitor to each visit, thank you        Counseling time on subjects discussed above is over 50% of today's 25 minute visit   Mirel Hundal 06/07/2016, 2:59 PM   Note: This office note was prepared with Dragon voice recognition system technology. Any transcriptional errors that result from this process are unintentional.

## 2016-06-07 NOTE — Telephone Encounter (Signed)
Rx submitted to pt's pharmacy.

## 2016-06-07 NOTE — Telephone Encounter (Signed)
PT called said he just left and wonders when his prescriptions will be called into the pharmacy.

## 2016-06-11 ENCOUNTER — Other Ambulatory Visit: Payer: Self-pay | Admitting: Endocrinology

## 2016-07-03 DIAGNOSIS — R972 Elevated prostate specific antigen [PSA]: Secondary | ICD-10-CM | POA: Diagnosis not present

## 2016-07-10 DIAGNOSIS — R972 Elevated prostate specific antigen [PSA]: Secondary | ICD-10-CM | POA: Diagnosis not present

## 2016-07-10 DIAGNOSIS — N400 Enlarged prostate without lower urinary tract symptoms: Secondary | ICD-10-CM | POA: Diagnosis not present

## 2016-07-15 ENCOUNTER — Ambulatory Visit: Payer: Medicare Other | Admitting: Endocrinology

## 2016-08-01 ENCOUNTER — Ambulatory Visit (INDEPENDENT_AMBULATORY_CARE_PROVIDER_SITE_OTHER): Payer: Medicare Other | Admitting: Endocrinology

## 2016-08-01 VITALS — BP 104/64 | HR 67 | Ht 71.0 in | Wt 193.0 lb

## 2016-08-01 DIAGNOSIS — E1165 Type 2 diabetes mellitus with hyperglycemia: Secondary | ICD-10-CM | POA: Diagnosis not present

## 2016-08-01 DIAGNOSIS — I2510 Atherosclerotic heart disease of native coronary artery without angina pectoris: Secondary | ICD-10-CM

## 2016-08-01 DIAGNOSIS — Z794 Long term (current) use of insulin: Secondary | ICD-10-CM | POA: Diagnosis not present

## 2016-08-01 NOTE — Progress Notes (Signed)
Patient ID: Alan Lopez, male   DOB: 06/10/1946, 70 y.o.   MRN: [id number]           Reason for Appointment: Follow-up for Type 2 Diabetes  Referring physician: Fulp  History of Present Illness:          Date of diagnosis of type 2 diabetes mellitus :  1999      Past history:  He had modestly increase blood sugars at diagnosis and was started on metformin Subsequently glipizide was added to improve his control. He thinks that his blood sugars have been mostly poorly controlled over the last several years He had been on glipizide and metformin for years without any adjustment of his dosage of glipizide  Review of his A1c shows that it had ranged from 9-9.5 since 07/2013 Previously his PCP tried him on a sample of Jardiance 10 mg without significant improvement  Recent history:   Oral hypoglycemic drugs the patient is taking are: metformin ER 500 mg, 2 tablets twice a day, Actos 15 mg daily,      INJECTABLE drugs: Toujeo 12 units, Humalog 6 units at supper, Victoza 0.6 mg at bedtime  Because of significant hyperglycemia in 6/17 he was started on mealtime insulin with Humalog His Trulicity was changed to Victoza since he had stopped on his own complaining about abdominal cramping  His last A1c was 9.4 in June  Current blood sugar patterns and problems identified:  He has not follow instructions or his new medications and has not followed up as directed last month  He has started VICTOZA but has not titrated the dose to 1.2 mg  He was supposed to take 16 units of Toujeo but is taking only 12  He is taking Humalog at suppertime but frequently when he is eating out he will not take it with him  Although his blood sugars are being checked mostly in the morning they appear to be generally  better even with starting low-dose Victoza and sometimes taking p.m. Humalog  He has gotten very little exercise  However his weight is about the same, previously gained weight because of  quitting smoking   He has only 2 postprandial readings at night over 200, only without taking mealtime insulin previously was having readings as high as 418  Side effects from medications have been: Mild  diarrhea from regular metformin  Compliance with the medical regimen: fair  Hypoglycemia: never   Glucose monitoring:  done once a day usually or less        Glucometer:   Contour   Blood Glucose readings by monitor download shows  Mean values apply above for all meters except median for One Touch  PRE-MEAL Fasting Lunch Dinner Bedtime Overall  Glucose range: 126-161    208-248    Mean/median: 144         Self-care: The diet that the patient has been following is: tries to limit high carbohydrate foods. eating out about 4 times a week and sometimes fast food.     Typical meal intake: Breakfast is  a protein drink.  Sometimes will have fried food Usually avoiding drinks with sugar               Dietician visit, most recent:never, has been scheduled               Exercise: gardening, some walking  Weight history: Maximum weight 215 previously  Wt Readings from Last 3 Encounters:  08/01/16 193 lb (87.5 kg)  06/07/16 192 lb (87.1 kg)  02/14/16 184 lb 12.8 oz (83.8 kg)    Glycemic control: last A1c 9.2 done on 02/13/15 Microalbumin ratio normal in 3/16 from PCP    Lab Results  Component Value Date   HGBA1C 9.4 (H) 05/29/2016   HGBA1C 7.5 (H) 10/31/2015   HGBA1C 7.1 (H) 06/29/2015   Lab Results  Component Value Date   MICROALBUR 1.0 10/31/2015   LDLCALC 49 10/31/2015   CREATININE 0.76 05/29/2016         Medication List       Accurate as of 08/01/16  3:40 PM. Always use your most recent med list.          aspirin EC 81 MG tablet Take 81 mg by mouth daily.   atorvastatin 80 MG tablet Commonly known as:  LIPITOR TAKE 1 TABLET BY MOUTH EVERY DAY   BAYER MICROLET LANCETS lancets Use as instructed to check blood sugar 2 times per day dx code E11.65   calcium  carbonate 500 MG chewable tablet Commonly known as:  TUMS - dosed in mg elemental calcium Chew 1 tablet by mouth daily as needed.   cholecalciferol 1000 units tablet Commonly known as:  VITAMIN D Take 1,000 Units by mouth daily.   clopidogrel 75 MG tablet Commonly known as:  PLAVIX Take 1 tablet (75 mg total) by mouth daily.   fexofenadine 180 MG tablet Commonly known as:  ALLEGRA Take 180 mg by mouth daily as needed for allergies (for seasonal allergies). Reported on 01/29/2016   finasteride 5 MG tablet Commonly known as:  PROSCAR Take 5 mg by mouth daily.   fluticasone 50 MCG/ACT nasal spray Commonly known as:  FLONASE Place 2 sprays into both nostrils daily as needed for allergies (for seasonal allergies). Reported on 01/29/2016   glucose blood test strip Commonly known as:  BAYER CONTOUR NEXT TEST Use as instructed to check blood sugar 2 times per day dx code E11.65   Insulin Glargine 300 UNIT/ML Sopn Commonly known as:  TOUJEO SOLOSTAR Inject 6 Units into the skin at bedtime.   insulin lispro 100 UNIT/ML KiwkPen Commonly known as:  HUMALOG Inject 6 units before each meal.   Insulin Pen Needle 32G X 4 MM Misc Use one per day to inject insulin   Liraglutide 18 MG/3ML Sopn Commonly known as:  VICTOZA Inject 0.2 mLs (1.2 mg total) into the skin daily.   losartan 25 MG tablet Commonly known as:  COZAAR Take 0.5 tablets (12.5 mg total) by mouth daily.   metFORMIN 500 MG 24 hr tablet Commonly known as:  GLUCOPHAGE-XR TAKE 2 TABLETS BY MOUTH IN THE MORNING AND 2 TABLETS BY MOUTH IN THE EVENING   metoprolol tartrate 25 MG tablet Commonly known as:  LOPRESSOR TAKE 1 TABLET BY MOUTH TWICE DAILY   multivitamin with minerals Tabs tablet Take 1 tablet by mouth daily.   pioglitazone 15 MG tablet Commonly known as:  ACTOS TAKE 1 TABLET BY MOUTH EVERY DAY.       Allergies:  Allergies  Allergen Reactions  . Codeine Other (See Comments)    Patient doesn't like how  it makes him feel, "spaced out, not together"    Past Medical History:  Diagnosis Date  . 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 HTN (hypertension)   . Hyperlipidemia   . Hypertrophy (benign) of prostate   . Multinodular goiter 04/12/2015  . Non-insulin dependent type 2 diabetes mellitus ([place])   . Status post non-ST elevation myocardial  infarction (NSTEMI)    CATH  . Tobacco abuse   . Type II diabetes mellitus, uncontrolled ([place]) 04/12/2015    Past Surgical History:  Procedure Laterality Date  . CORONARY ANGIOPLASTY WITH STENT PLACEMENT      Family History  Problem Relation Age of Onset  . Diabetes Mother   . Diabetes Brother   . Cancer Brother   . Heart disease Neg Hx     Social History:  reports that he quit smoking about 7 months ago. He has never used smokeless tobacco. His alcohol and drug histories are not on file.    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   ?  Hypertension: Currently only on 25 mg of losartan  THYROID: He was found to have a 2 cm nodule on his left thyroid on his exam but ultrasound showed multinodular goiter with mostly small nodules and the largest nodule on the left side of 1.4 cm has the appearance of a pseudo- nodule Previous TSH normal in 2015   LABS:  No visits with results within 1 Week(s) from this visit.  Latest known visit with results is:  Lab on 05/29/2016  Component Date Value Ref Range Status  . Hgb A1c MFr Bld 05/29/2016 9.4* 4.6 - 6.5 % Final  . Sodium 05/29/2016 139  135 - 145 mEq/L Final  . Potassium 05/29/2016 4.3  3.5 - 5.1 mEq/L Final  . Chloride 05/29/2016 106   96 - 112 mEq/L Final  . CO2 05/29/2016 23  19 - 32 mEq/L Final  . Glucose, Bld 05/29/2016 194* 70 - 99 mg/dL Final  . BUN 05/29/2016 19  6 - 23 mg/dL Final  . Creatinine, Ser 05/29/2016 0.76  0.40 - 1.50 mg/dL Final  . Total Bilirubin 05/29/2016 0.6  0.2 - 1.2 mg/dL Final  . Alkaline Phosphatase 05/29/2016 49  39 - 117 U/L Final  . AST 05/29/2016 18  0 - 37 U/L Final  . ALT 05/29/2016 34  0 - 53 U/L Final  . Total Protein 05/29/2016 6.3  6.0 - 8.3 g/dL Final  . Albumin 05/29/2016 3.9  3.5 - 5.2 g/dL Final  . Calcium 05/29/2016 9.4  8.4 - 10.5 mg/dL Final  . GFR 05/29/2016 107.37  >60.00 mL/min Final    Physical Examination:  BP 104/64   Pulse 67   Ht 5\' 11"  (1.803 m)   Wt 193 lb (87.5 kg)   SpO2 97%   BMI 26.92 kg/m      ASSESSMENT:  Diabetes type 2, uncontrolled  See history of present illness for detailed discussion of his current management, blood sugar patterns and problems identified  Even with starting only 0.6 mg Victoza his blood sugars differently better and fasting readings are improved even without the full dose of Toujeo as prescribed He is not understanding the need to take Humalog with every meal and to take it when he goes out to eat Also does not understand the actions of various drugs His weight has stabilized  also  PLAN:   INSULIN: He will continue Toujeo at 12 units He will make sure he takes his Humalog with him when going out to eat Discussed that he does need to check postprandial readings consistently at different times of the day and discussed blood sugar targets Showed him how to increase the Victoza to 0.9 mg and if tolerated go up to 1.2 mg next week Needs short-term follow-up in 6 weeks and will get labs also done today.   He does not need to continue Actos   There are no Patient Instructions on file for this visit.   Counseling time on subjects discussed above is over 50% of today's 25 minute visit   Merdith Boyd 08/01/2016, 3:40 PM    Note: This office note was prepared with Estate agent. Any transcriptional errors that result from this process are unintentional.

## 2016-08-01 NOTE — Patient Instructions (Signed)
GLUCOSE monitoring: Alternate your morning blood sugar with a reading either midmorning, midafternoon or a couple of hours after EVENING meal  HUMALOG/gray pen.  This has to be taken right before eating supper and may take this with you at room temperature.  Start taking 6 units. If the blood sugar after supper is still over 180, up to 8 units May need larger doses also for meals at more carbohydrate  VICTOZA:/Blue pen take [id number] clicks for the first 3 days and then try taking 1.2 mg daily if tolerated  TOUJEO/green pen: Continue taking 12 units  Stop pioglitazone  Try to walk briskly at least 3-4 times a week

## 2016-08-13 ENCOUNTER — Telehealth: Payer: Self-pay | Admitting: Endocrinology

## 2016-08-13 ENCOUNTER — Telehealth: Payer: Self-pay | Admitting: *Deleted

## 2016-08-13 MED ORDER — INSULIN PEN NEEDLE 32G X 4 MM MISC: 5 refills | Status: DC

## 2016-08-13 NOTE — Telephone Encounter (Signed)
Patient need new prescription for Insulin Pen Needle 32G X 4 MM MISC. Send to [facility] on [address]

## 2016-08-13 NOTE — Telephone Encounter (Signed)
PT is requesting his Victoza and Tojeou refills be submitted to the [facility].

## 2016-08-14 ENCOUNTER — Other Ambulatory Visit: Payer: Self-pay | Admitting: *Deleted

## 2016-08-14 MED ORDER — INSULIN GLARGINE 300 UNIT/ML ~~LOC~~ SOPN: 16.0000 [IU] | PEN_INJECTOR | Freq: Every day | SUBCUTANEOUS | 2 refills | Status: DC

## 2016-08-14 MED ORDER — LIRAGLUTIDE 18 MG/3ML ~~LOC~~ SOPN: 1.2000 mg | PEN_INJECTOR | Freq: Every day | SUBCUTANEOUS | 2 refills | Status: DC

## 2016-08-14 NOTE — Telephone Encounter (Signed)
Both Rx's have been sent to [facility] on [address] as requested

## 2016-08-14 NOTE — Telephone Encounter (Signed)
Pt needs victoza and toujeo called into [facility] please

## 2016-08-15 ENCOUNTER — Encounter: Payer: Self-pay | Admitting: Cardiology

## 2016-08-16 ENCOUNTER — Other Ambulatory Visit: Payer: Self-pay

## 2016-08-18 ENCOUNTER — Other Ambulatory Visit: Payer: Self-pay | Admitting: Endocrinology

## 2016-08-20 ENCOUNTER — Ambulatory Visit (INDEPENDENT_AMBULATORY_CARE_PROVIDER_SITE_OTHER): Payer: Medicare Other | Admitting: Cardiology

## 2016-08-20 ENCOUNTER — Encounter: Payer: Self-pay | Admitting: Cardiology

## 2016-08-20 VITALS — BP 138/78 | HR 60 | Ht 71.0 in | Wt 195.6 lb

## 2016-08-20 DIAGNOSIS — I2510 Atherosclerotic heart disease of native coronary artery without angina pectoris: Secondary | ICD-10-CM | POA: Diagnosis not present

## 2016-08-20 DIAGNOSIS — Z9861 Coronary angioplasty status: Secondary | ICD-10-CM | POA: Diagnosis not present

## 2016-08-20 DIAGNOSIS — E785 Hyperlipidemia, unspecified: Secondary | ICD-10-CM

## 2016-08-20 DIAGNOSIS — I252 Old myocardial infarction: Secondary | ICD-10-CM | POA: Diagnosis not present

## 2016-08-20 DIAGNOSIS — I2583 Coronary atherosclerosis due to lipid rich plaque: Secondary | ICD-10-CM

## 2016-08-20 NOTE — Progress Notes (Signed)
Cardiology Office Note    Date:  08/20/2016   ID:  Alan Lopez, DOB 09-Feb-1946, MRN [id number]  PCP:  Antony Blackbird, MD  Cardiologist:   Candee Furbish, MD     History of Present Illness:  Alan Lopez is a 70 y.o. male with history of non-ST elevation myocardial infarction, hypertension, tobacco use, hyperlipidemia, diabetes here for follow up.   He was in [place] at [facility] and I reviewed consultation from 12/12/15 by Dr. Alla German of cardiology. Had chest discomfort, general uneasiness followed by left-sided chest discomfort. Pressure. No radiation to arms or jaw. He recently visited family in [place] at altitude and had no difficulty.  His EKG showed junctional bradycardia with lateral ST and T-wave inversion. Echocardiogram showed mild inferior base height both kidney cyst. Troponin was 5.7 initially. EKG personally viewed.  He underwent cardiac catheterization on 12/12/15 where he had severe disease of his right coronary artery and successful DES placement to the right coronary artery. Transient no reflow. LV function appeared preserved. Left ventricular end-diastolic pressure was 16 mmHg.  Feeling OK. Brilinta dyspnea. Changed to Plavix.  Since changing to Plavix doing better. Little tired outside. 4hrs in yard. He enjoyed his cardiac rehabilitation. No chest pain, no shortness of breath, no syncope. No bleeding.Eager to start at the [facility]. His wife unfortunately has omental cancer.  One of his neighbors is [name].  Past Medical History:  Diagnosis Date  . 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 HTN (hypertension)   . Hyperlipidemia   . Hypertrophy (benign) of prostate   . Multinodular goiter 04/12/2015  . Non-insulin dependent type 2 diabetes mellitus ([place])   . Status post non-ST elevation myocardial infarction (NSTEMI)    CATH  . Tobacco abuse   . Type II diabetes mellitus, uncontrolled ([place]) 04/12/2015    Past Surgical History:  Procedure  Laterality Date  . CORONARY ANGIOPLASTY WITH STENT PLACEMENT      Outpatient Medications Prior to Visit  Medication Sig Dispense Refill  . aspirin EC 81 MG tablet Take 81 mg by mouth daily.    Marland Kitchen BAYER MICROLET LANCETS lancets Use as instructed to check blood sugar 2 times per day dx code E11.65 100 each 3  . BD PEN NEEDLE NANO U/F 32G X 4 MM MISC USE TO INJECT INSULIN ONCE DAILY 100 each 3  . calcium carbonate (TUMS - DOSED IN MG ELEMENTAL CALCIUM) 500 MG chewable tablet Chew 1 tablet by mouth daily as needed.     . cholecalciferol (VITAMIN D) 1000 UNITS tablet Take 1,000 Units by mouth daily.    . clopidogrel (PLAVIX) 75 MG tablet Take 1 tablet (75 mg total) by mouth daily. 90 tablet 3  . fexofenadine (ALLEGRA) 180 MG tablet Take 180 mg by mouth daily as needed for allergies (for seasonal allergies). Reported on 01/29/2016    . finasteride (PROSCAR) 5 MG tablet Take 5 mg by mouth daily.  3  . fluticasone (FLONASE) 50 MCG/ACT nasal spray Place 2 sprays into both nostrils daily as needed for allergies (for seasonal allergies). Reported on 01/29/2016    . glucose blood (BAYER CONTOUR NEXT TEST) test strip Use as instructed to check blood sugar 2 times per day dx code E11.65 100 each 3  . Insulin Glargine (TOUJEO SOLOSTAR) 300 UNIT/ML SOPN Inject 16 Units into the skin at bedtime. 4.5 mL 2  . insulin lispro (HUMALOG) 100 UNIT/ML KiwkPen Inject 6 units before each meal. 15 mL 4  . Insulin Pen  Needle 32G X 4 MM MISC Use one per day to inject insulin 30 each 5  . Liraglutide (VICTOZA) 18 MG/3ML SOPN Inject 0.2 mLs (1.2 mg total) into the skin daily. 9 mL 2  . losartan (COZAAR) 25 MG tablet Take 0.5 tablets (12.5 mg total) by mouth daily. 45 tablet 3  . metFORMIN (GLUCOPHAGE-XR) 500 MG 24 hr tablet TAKE 2 TABLETS BY MOUTH IN THE MORNING AND 2 TABLETS BY MOUTH IN THE EVENING 360 tablet 1  . metoprolol tartrate (LOPRESSOR) 25 MG tablet TAKE 1 TABLET BY MOUTH TWICE DAILY 60 tablet 11  . Multiple Vitamin  (MULTIVITAMIN WITH MINERALS) TABS tablet Take 1 tablet by mouth daily.    . pioglitazone (ACTOS) 15 MG tablet TAKE 1 TABLET BY MOUTH EVERY DAY. 90 tablet 1  . atorvastatin (LIPITOR) 80 MG tablet TAKE 1 TABLET BY MOUTH EVERY DAY 30 tablet 11   No facility-administered medications prior to visit.      Allergies:   Codeine   Social History   Social History  . Marital status: Married    Spouse name: N/A  . Number of children: N/A  . Years of education: N/A   Social History Main Topics  . Smoking status: Former Smoker    Quit date: 12/11/2015  . Smokeless tobacco: Never Used  . Alcohol use No  . Drug use: No  . Sexual activity: Not Asked   Other Topics Concern  . None   Social History Narrative  . None     Family History:  The patient's family history includes Cancer in his brother; Diabetes in his brother and mother.   ROS:   Please see the history of present illness.    ROS easy bruising from dual antiplatelet therapy. No recent chest pain, he is feeling some shortness of breath after Brilinta. No strokelike symptoms, no significant orthopnea. All other systems reviewed and are negative.   PHYSICAL EXAM:   VS:  BP 138/78   Pulse 60   Ht 5\' 11"  (1.803 m)   Wt 195 lb 9.6 oz (88.7 kg)   BMI 27.28 kg/m    GEN: Well nourished, well developed, in no acute distress  HEENT: normal  Neck: no JVD, carotid bruits, or masses Cardiac: RRR; no murmurs, rubs, or gallops,no edema  Respiratory:  clear to auscultation bilaterally, normal work of breathing GI: soft, nontender, nondistended, + BS MS: no deformity or atrophy  Skin: warm and dry, no rash Neuro:  Alert and Oriented x 3, Strength and sensation are intact Psych: euthymic mood, full affect  Wt Readings from Last 3 Encounters:  08/20/16 195 lb 9.6 oz (88.7 kg)  08/01/16 193 lb (87.5 kg)  06/07/16 192 lb (87.1 kg)      Studies/Labs Reviewed:   EKG:  12/11/15 shows accelerated junctional rhythm with deep T-wave  inversion in 1, aVL, V2 as well as T-wave inversion in V4 through V6. Heart rate 86 bpm.  Recent Labs: 05/29/2016: ALT 34; BUN 19; Creatinine, Ser 0.76; Potassium 4.3; Sodium 139   Lipid Panel    Component Value Date/Time   CHOL 120 10/31/2015 0857   TRIG 72.0 10/31/2015 0857   HDL 56.40 10/31/2015 0857   CHOLHDL 2 10/31/2015 0857   VLDL 14.4 10/31/2015 0857   LDLCALC 49 10/31/2015 0857    Additional studies/ records that were reviewed today include:  [facility] from [facility], cardiac catheterization, EKG, lab work reviewed, consultation reviewed    ASSESSMENT:    1. History of MI (  myocardial infarction)   2. Hyperlipidemia   3. Coronary artery disease due to lipid rich plaque   4. S/P PTCA (percutaneous transluminal coronary angioplasty)      PLAN:  In order of problems listed above:  Non-ST elevation myocardial infarction  - 12/12/15  - RCA DES  - EF, mild mitral regurgitation, mild tricuspid regurgitation    - Basal inferior hypokinesis.  - Markedly abnormal T-wave inversion on EKG. These EKG changes resolved on subsequent EKG. Personally viewed.  - dyspnea related to Brilinta administration,Plavix Resolved.  - Continue dual antiplatelet therapy for one year. He may stop on 12/11/2016, continue aspirin 81 mg lifelong.  - Start slowly with yard work at home. He enjoys this.  - He is enjoying cardiac rehabilitation now graduated  Coronary artery disease  - Right coronary artery stenting-80-90% lesion of RCA. Single DES.  - First diagonal ostial 40%.  - Aggressive secondary prevention  - Beta blocker, ARB (taking low dose for renal protection)  Hyperlipidemia  - Atorvastatin  decrease to 40 mg.  - total cholesterol 117, triglycerides 150, LDL 38, HDL 48,  Diabetes with cardiac complications  - 123456 9.4  - Per primary, Dr. Chapman Fitch and Dr Dwyane Dee  - Has blindness in one of his eyes, diabetic complication.    Medication Adjustments/Labs and Tests Ordered: Current  medicines are reviewed at length with the patient today.  Concerns regarding medicines are outlined above.  Medication changes, Labs and Tests ordered today are listed in the Patient Instructions below. Patient Instructions  Medication Instructions:  The current medical regimen is effective;  continue present plan and medications. You may stop your Plavix in January 2018 (after 12/11/17)  Follow-Up: Follow up in 6 months with Dr. Marlou Porch.  You will receive a letter in the mail 2 months before you are due.  Please call us when you receive this letter to schedule your follow up appointment.  If you need a refill on your cardiac medications before your next appointment, please call your pharmacy.  Thank you for choosing [facility]!!          Signed, Candee Furbish, MD  08/20/2016 9:28 AM    [address], [place], [place]  [address] Phone: [phone]; Fax: [phone]

## 2016-08-20 NOTE — Patient Instructions (Signed)
Medication Instructions:  The current medical regimen is effective;  continue present plan and medications. You may stop your Plavix in January 2018 (after 12/11/17)  Follow-Up: Follow up in 6 months with Dr. Marlou Porch.  You will receive a letter in the mail 2 months before you are due.  Please call us when you receive this letter to schedule your follow up appointment.  If you need a refill on your cardiac medications before your next appointment, please call your pharmacy.  Thank you for choosing [facility]!!

## 2016-09-09 ENCOUNTER — Other Ambulatory Visit (INDEPENDENT_AMBULATORY_CARE_PROVIDER_SITE_OTHER): Payer: Medicare Other

## 2016-09-09 DIAGNOSIS — E1165 Type 2 diabetes mellitus with hyperglycemia: Secondary | ICD-10-CM

## 2016-09-09 DIAGNOSIS — Z794 Long term (current) use of insulin: Secondary | ICD-10-CM | POA: Diagnosis not present

## 2016-09-09 LAB — BASIC METABOLIC PANEL
BUN: 20 mg/dL (ref 6–23)
CALCIUM: 9.1 mg/dL (ref 8.4–10.5)
CHLORIDE: 106 meq/L (ref 96–112)
CO2: 26 meq/L (ref 19–32)
CREATININE: 0.89 mg/dL (ref 0.40–1.50)
GFR: 89.41 mL/min (ref >60.00)
GLUCOSE: 126 mg/dL — AB (ref 70–99)
Potassium: 4.9 mEq/L (ref 3.5–5.1)
SODIUM: 139 meq/L (ref 135–145)

## 2016-09-10 LAB — FRUCTOSAMINE: FRUCTOSAMINE: 255 umol/L (ref 0–285)

## 2016-09-12 ENCOUNTER — Ambulatory Visit (INDEPENDENT_AMBULATORY_CARE_PROVIDER_SITE_OTHER): Payer: Medicare Other | Admitting: Endocrinology

## 2016-09-12 ENCOUNTER — Encounter: Payer: Self-pay | Admitting: Endocrinology

## 2016-09-12 ENCOUNTER — Other Ambulatory Visit: Payer: Self-pay | Admitting: *Deleted

## 2016-09-12 VITALS — BP 148/94 | HR 64 | Temp 98.2°F | Resp 16 | Ht 71.0 in | Wt 195.2 lb

## 2016-09-12 DIAGNOSIS — Z23 Encounter for immunization: Secondary | ICD-10-CM

## 2016-09-12 DIAGNOSIS — I2583 Coronary atherosclerosis due to lipid rich plaque: Secondary | ICD-10-CM

## 2016-09-12 DIAGNOSIS — Z794 Long term (current) use of insulin: Secondary | ICD-10-CM | POA: Diagnosis not present

## 2016-09-12 DIAGNOSIS — E1165 Type 2 diabetes mellitus with hyperglycemia: Secondary | ICD-10-CM

## 2016-09-12 DIAGNOSIS — I2510 Atherosclerotic heart disease of native coronary artery without angina pectoris: Secondary | ICD-10-CM | POA: Diagnosis not present

## 2016-09-12 MED ORDER — INSULIN PEN NEEDLE 32G X 4 MM MISC: 5 refills | Status: DC

## 2016-09-12 NOTE — Patient Instructions (Addendum)
Humalog 8-10 based on size of meal at supper  May stop Actos  Metformin 3 daily total  Check blood sugars on waking up  3 x per week  Also check blood sugars about 2 hours after a meal and do this after different meals by rotation  Recommended blood sugar levels on waking up is 90-130 and about 2 hours after meal is 130-160  Please bring your blood sugar monitor to each visit, thank you

## 2016-09-12 NOTE — Progress Notes (Signed)
Patient ID: Alan Lopez, male   DOB: 05/18/1946, 70 y.o.   MRN: [id number]           Reason for Appointment: Follow-up for Type 2 Diabetes  Referring physician: Fulp  History of Present Illness:          Date of diagnosis of type 2 diabetes mellitus :  1999      Past history:  He had modestly increase blood sugars at diagnosis and was started on metformin Subsequently glipizide was added to improve his control. He thinks that his blood sugars have been mostly poorly controlled over the last several years He had been on glipizide and metformin for years without any adjustment of his dosage of glipizide  Review of his A1c shows that it had ranged from 9-9.5 since 07/2013 Previously his PCP tried him on a sample of Jardiance 10 mg without significant improvement  Recent history:   Oral hypoglycemic drugs the patient is taking are: metformin ER 500 mg, 2 tablets twice a day, Actos 15 mg daily,     INJECTABLE drugs: Toujeo 12 units, Humalog 12 units at supper, Victoza 1.2 mg at bedtime  Because of significant hyperglycemia in 6/17 he was started on mealtime insulin with Humalog at dinner  His last A1c was 9.4 in June but now with current regimen fructosamine is fairly good at 252  Current blood sugar patterns and problems identified:  Since he was still having marked post prandial hyperglycemia on his last visit he was told to increase his dose to 1.2 mg.  He has tolerated this well without nausea, previously had some GI side effects with Trulicity  He has by mistake increased his Humalog to 12 units instead of 6 at suppertime  However he has had low blood sugars only twice, once about 9 PM and another time during the night  FASTING blood sugars are still excellent with current regimen of Toujeo  He has only a few readings in the evenings after meals and these are fairly good blood only one high reading of 205  His weight has leveled off  Side effects from medications have  been: Mild  diarrhea from regular metformin  Compliance with the medical regimen: fair  Hypoglycemia: never   Glucose monitoring:  done once a day usually or less        Glucometer:   Contour   Blood Glucose readings by monitor download shows  Mean values apply above for all meters except median for One Touch  PRE-MEAL Fasting Lunch Dinner Bedtime Overall  Glucose range: 87-150  104  88-205    Mean/median: 113   131     Self-care: The diet that the patient has been following is: tries to limit high carbohydrate foods. eating out about 4 times a week     Typical meal intake: Breakfast is  a protein drink.  Sometimes will have fried food Usually avoiding drinks with sugar                Dietician visit, most recent:never CDE visit: 01/2016               Exercise: gardening, some walking  Weight history: Maximum weight 215 previously  Wt Readings from Last 3 Encounters:  09/12/16 195 lb 3.2 oz (88.5 kg)  08/20/16 195 lb 9.6 oz (88.7 kg)  08/01/16 193 lb (87.5 kg)    Glycemic control: last A1c 9.2 done on 02/13/15 Microalbumin ratio normal in 3/16 from PCP  Lab Results  Component Value Date   HGBA1C 9.4 (H) 05/29/2016   HGBA1C 7.5 (H) 10/31/2015   HGBA1C 7.1 (H) 06/29/2015   Lab Results  Component Value Date   MICROALBUR 1.0 10/31/2015   LDLCALC 49 10/31/2015   CREATININE 0.89 09/09/2016    Lab on 09/09/2016  Component Date Value Ref Range Status  . Sodium 09/09/2016 139  135 - 145 mEq/L Final  . Potassium 09/09/2016 4.9  3.5 - 5.1 mEq/L Final  . Chloride 09/09/2016 106  96 - 112 mEq/L Final  . CO2 09/09/2016 26  19 - 32 mEq/L Final  . Glucose, Bld 09/09/2016 126* 70 - 99 mg/dL Final  . BUN 09/09/2016 20  6 - 23 mg/dL Final  . Creatinine, Ser 09/09/2016 0.89  0.40 - 1.50 mg/dL Final  . Calcium 09/09/2016 9.1  8.4 - 10.5 mg/dL Final  . GFR 09/09/2016 89.41  >60.00 mL/min Final  . Fructosamine 09/10/2016 255  0 - 285 umol/L Final   Comment: Published reference  interval for apparently healthy subjects between age 36 and 20 is 12 - 285 umol/L and in a poorly controlled diabetic population is 228 - 563 umol/L with a mean of 396 umol/L.         Medication List       Accurate as of 09/12/16  2:07 PM. Always use your most recent med list.          aspirin EC 81 MG tablet Take 81 mg by mouth daily.   atorvastatin 80 MG tablet Commonly known as:  LIPITOR Take 40 mg by mouth daily.   BAYER MICROLET LANCETS lancets Use as instructed to check blood sugar 2 times per day dx code E11.65   calcium carbonate 500 MG chewable tablet Commonly known as:  TUMS - dosed in mg elemental calcium Chew 1 tablet by mouth daily as needed.   cholecalciferol 1000 units tablet Commonly known as:  VITAMIN D Take 1,000 Units by mouth daily.   clopidogrel 75 MG tablet Commonly known as:  PLAVIX Take 1 tablet (75 mg total) by mouth daily.   fexofenadine 180 MG tablet Commonly known as:  ALLEGRA Take 180 mg by mouth daily as needed for allergies (for seasonal allergies). Reported on 01/29/2016   finasteride 5 MG tablet Commonly known as:  PROSCAR Take 5 mg by mouth daily.   fluticasone 50 MCG/ACT nasal spray Commonly known as:  FLONASE Place 2 sprays into both nostrils daily as needed for allergies (for seasonal allergies). Reported on 01/29/2016   glucose blood test strip Commonly known as:  BAYER CONTOUR NEXT TEST Use as instructed to check blood sugar 2 times per day dx code E11.65   Insulin Glargine 300 UNIT/ML Sopn Commonly known as:  TOUJEO SOLOSTAR Inject 16 Units into the skin at bedtime.   insulin lispro 100 UNIT/ML KiwkPen Commonly known as:  HUMALOG Inject 6 units before each meal.   Insulin Pen Needle 32G X 4 MM Misc Use Three per day to inject insulin   Liraglutide 18 MG/3ML Sopn Commonly known as:  VICTOZA Inject 0.2 mLs (1.2 mg total) into the skin daily.   losartan 25 MG tablet Commonly known as:  COZAAR Take 0.5 tablets  (12.5 mg total) by mouth daily.   metFORMIN 500 MG 24 hr tablet Commonly known as:  GLUCOPHAGE-XR TAKE 2 TABLETS BY MOUTH IN THE MORNING AND 2 TABLETS BY MOUTH IN THE EVENING   metoprolol tartrate 25 MG tablet Commonly known as:  LOPRESSOR  TAKE 1 TABLET BY MOUTH TWICE DAILY   multivitamin with minerals Tabs tablet Take 1 tablet by mouth daily.   pioglitazone 15 MG tablet Commonly known as:  ACTOS TAKE 1 TABLET BY MOUTH EVERY DAY.       Allergies:  Allergies  Allergen Reactions  . Codeine Other (See Comments)    Patient doesn't like how it makes him feel, "spaced out, not together"    Past Medical History:  Diagnosis Date  . 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 HTN (hypertension)   . Hyperlipidemia   . Hypertrophy (benign) of prostate   . Multinodular goiter 04/12/2015  . Non-insulin dependent type 2 diabetes mellitus ([place])   . Status post non-ST elevation myocardial infarction (NSTEMI)    CATH  . Tobacco abuse   . Type II diabetes mellitus, uncontrolled ([place]) 04/12/2015    Past Surgical History:  Procedure Laterality Date  . CORONARY ANGIOPLASTY WITH STENT PLACEMENT      Family History  Problem Relation Age of Onset  . Diabetes Mother   . Diabetes Brother   . Cancer Brother   . Heart disease Neg Hx     Social History:  reports that he quit smoking about 9 months ago. He has never used smokeless tobacco. He reports that he does not drink alcohol or use drugs.    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   ?  Hypertension: Currently only on 25 mg of losartan  THYROID: He was found to have a 2 cm  nodule on his left thyroid on his exam but ultrasound showed multinodular goiter with mostly small nodules and the largest nodule on the left side of 1.4 cm has the appearance of a pseudo- nodule Previous TSH normal in 2015   LABS:  Lab on 09/09/2016  Component Date Value Ref Range Status  . Sodium 09/09/2016 139  135 - 145 mEq/L Final  . Potassium 09/09/2016 4.9  3.5 - 5.1 mEq/L Final  . Chloride 09/09/2016 106  96 - 112 mEq/L Final  . CO2 09/09/2016 26  19 - 32 mEq/L Final  . Glucose, Bld 09/09/2016 126* 70 - 99 mg/dL Final  . BUN 09/09/2016 20  6 - 23 mg/dL Final  . Creatinine, Ser 09/09/2016 0.89  0.40 - 1.50 mg/dL Final  . Calcium 09/09/2016 9.1  8.4 - 10.5 mg/dL Final  . GFR 09/09/2016 89.41  >60.00 mL/min Final  . Fructosamine 09/10/2016 255  0 - 285 umol/L Final   Comment: Published reference interval for apparently healthy subjects between age 29 and 37 is 62 - 285 umol/L and in a poorly controlled diabetic population is 228 - 563 umol/L with a mean of 396 umol/L.     Physical Examination:  BP (!) 148/94   Pulse 64   Temp 98.2 F (36.8 C)   Resp 16   Ht 5\' 11"  (1.803 m)   Wt 195 lb 3.2 oz (88.5 kg)   SpO2 95%   BMI 27.22 kg/m  ASSESSMENT:  Diabetes type 2, uncontrolled  See history of present illness for detailed discussion of his current management, blood sugar patterns and problems identified  He has done very well with a regimen of Victoza 1.2 mg, Toujeo 12 units and Humalog at suppertime Although he was not told to increase his Humalog to 12 units at suppertime he has done so inadvertently but this is controlling his blood sugars very well after supper He has however occasionally about his blood sugar go down after supper and once during the night Fasting readings are excellent, averaging 113  Fructosamine indicates good control No increase in weight with improved glucose  PLAN:   INSULIN: He will continue Toujeo at 12 units, discussed that this  controls overnight blood sugars and may possibly need to adjust this if his fasting readings stay out of the discussed range  He will reduce his Humalog down to about 8-10 units based on meal size at suppertime Discussed differences between the 2 insulins and need to take Humalog consistently before eating even  When eating out. Continue Victoza unchanged at 1.2 mg More blood sugars after supper and occasionally after lunch and breakfast    He does not need to continue Actos, unclear if this is helping in control.  However if blood sugars start going up may need to start back on this A1c in 2 months Reduce his metformin to 3 tablets since he tends to have some loose stools with this   Patient Instructions  Humalog 8-10 based on size of meal at supper  May stop Actos  Metformin 3 daily total  Check blood sugars on waking up  3 x per week  Also check blood sugars about 2 hours after a meal and do this after different meals by rotation  Recommended blood sugar levels on waking up is 90-130 and about 2 hours after meal is 130-160  Please bring your blood sugar monitor to each visit, thank you       Counseling time on subjects discussed above is over 50% of today's 25 minute visit     Iyona Pehrson 09/12/2016, 2:07 PM   Note: This office note was prepared with Dragon voice recognition system technology. Any transcriptional errors that result from this process are unintentional.

## 2016-09-13 ENCOUNTER — Encounter: Payer: Self-pay | Admitting: Endocrinology

## 2016-11-14 ENCOUNTER — Other Ambulatory Visit: Payer: Self-pay | Admitting: Endocrinology

## 2016-12-01 IMAGING — US US SOFT TISSUE HEAD/NECK
1 series · 13 of 25 positions shown · non-contrast
Comparison: None.

CLINICAL DATA: Multi nodular goiter.

EXAM:
THYROID ULTRASOUND
TECHNIQUE: Ultrasound examination of the thyroid gland and adjacent soft
tissues was performed.

[Series 1: us soft tissue head/neck · 0.08mm/px · 13 of 60 slices shown]
[im 1/60]
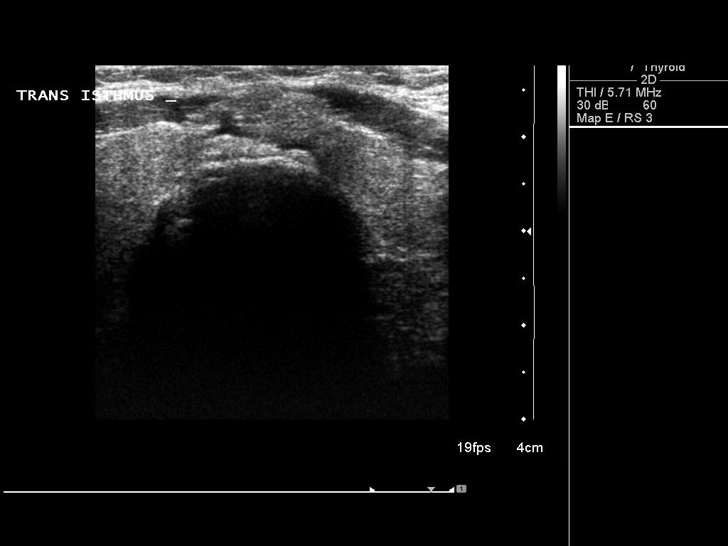
[im 5/60]
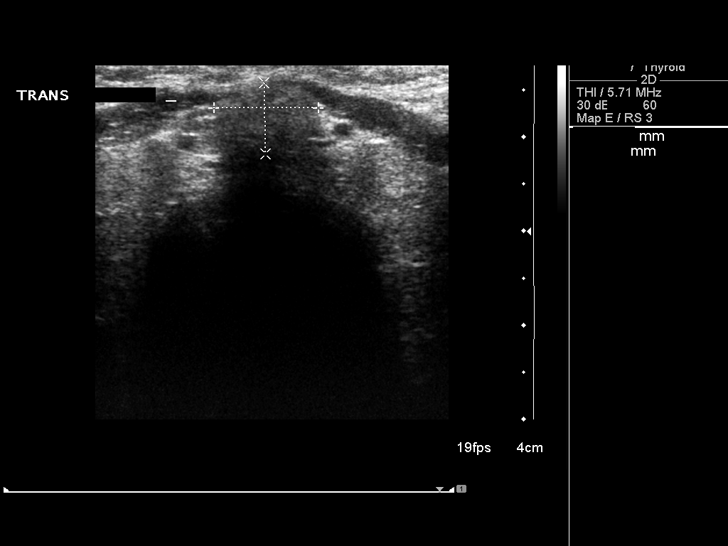
[im 10/60]
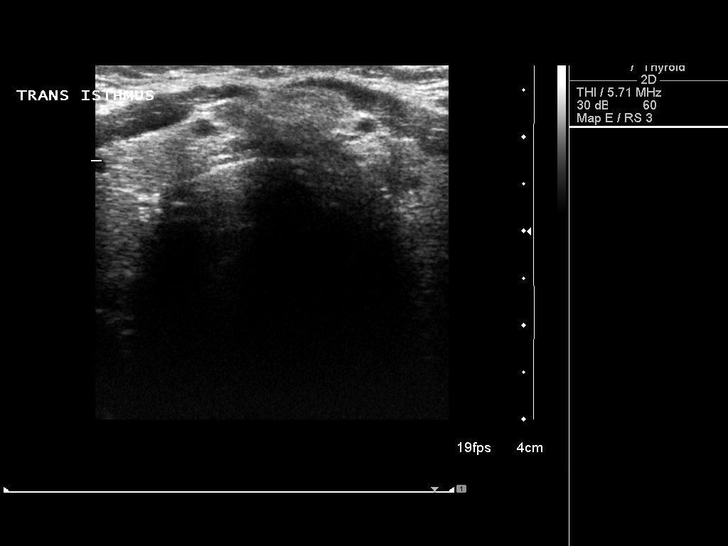
[im 15/60]
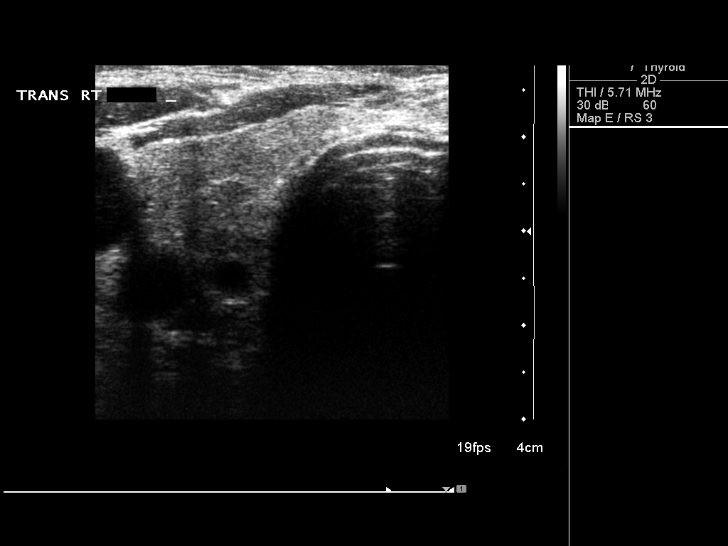
[im 20/60]
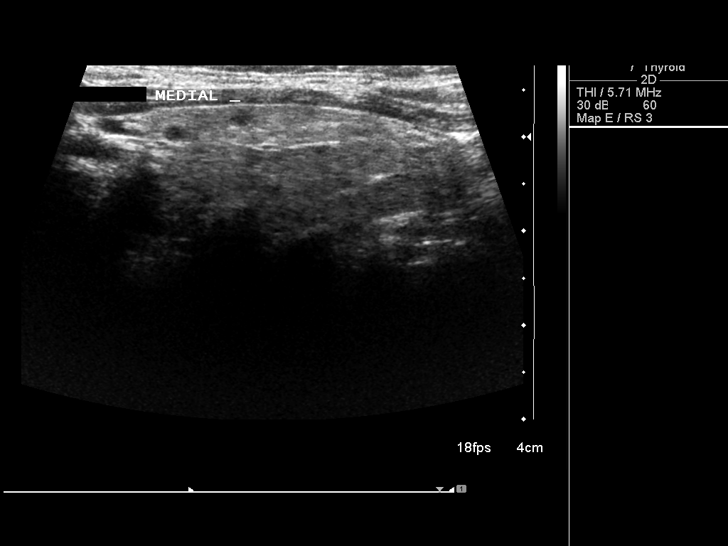
[im 25/60]
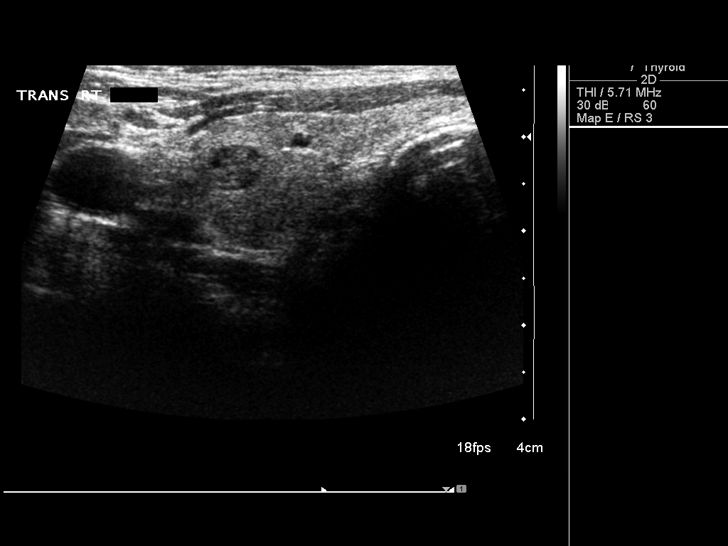
[im 30/60]
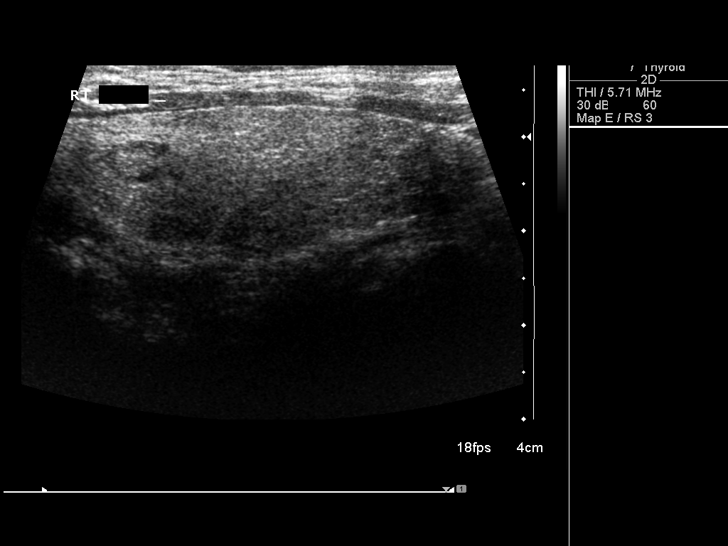
[im 35/60]
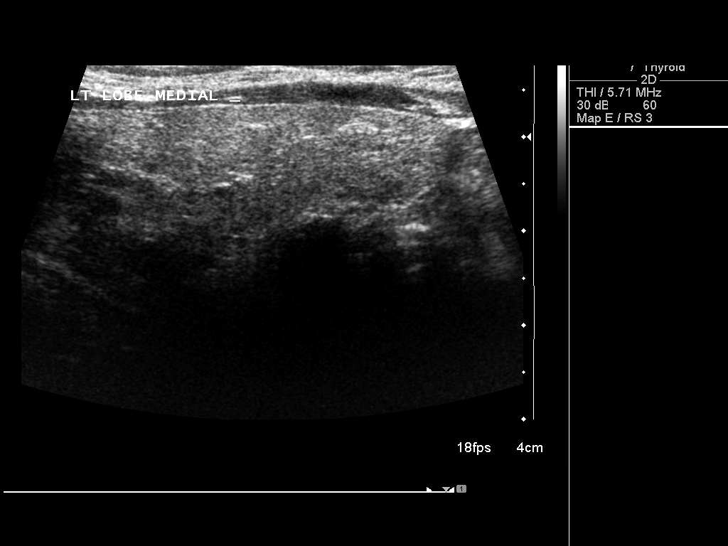
[im 40/60]
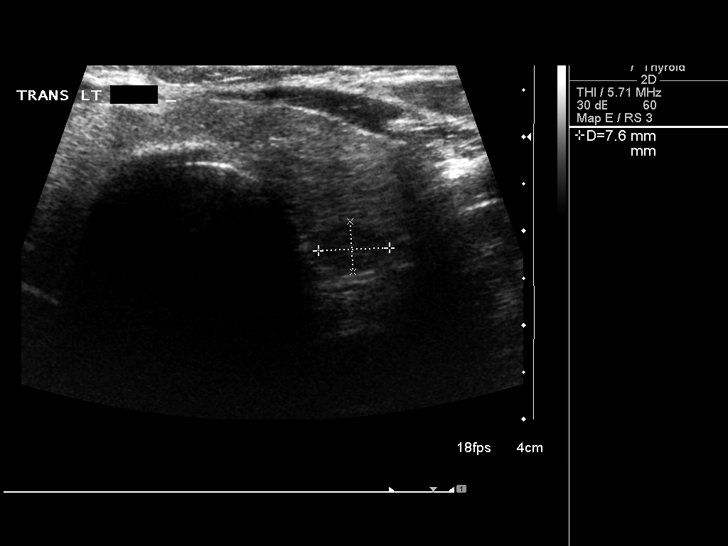
[im 45/60]
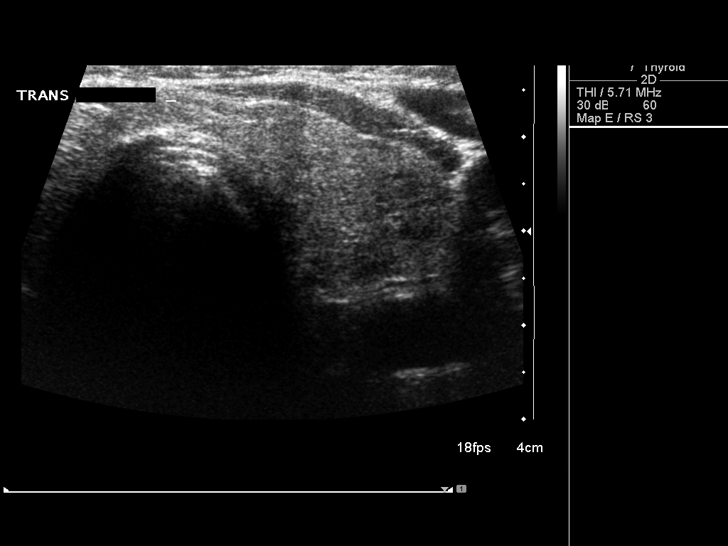
[im 50/60]
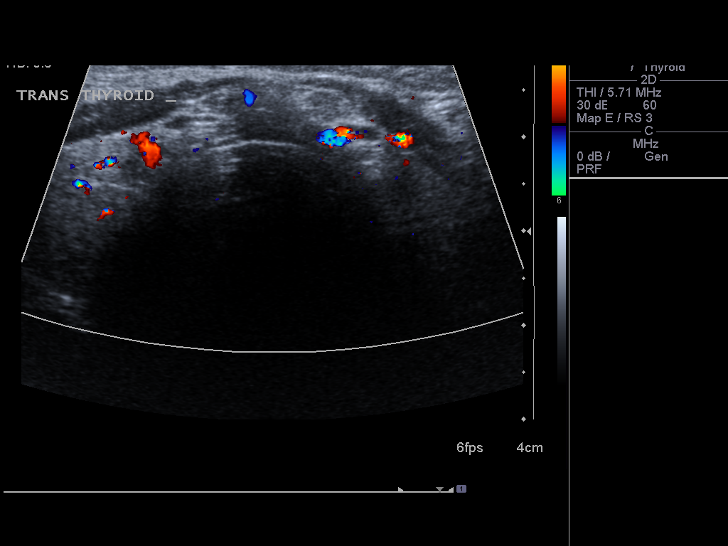
[im 55/60]
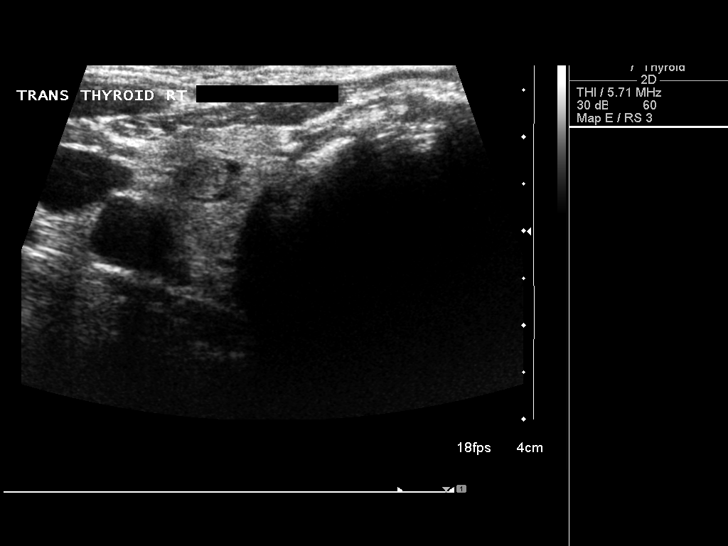
[im 60/60]
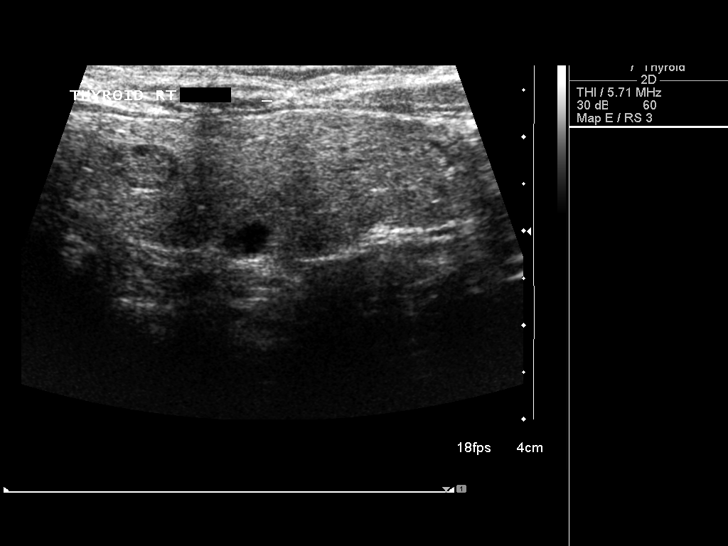

[13 of 25 positions shown; findings below may reference images not displayed]

FINDINGS: Right thyroid lobe

Measurements: Normal in size measuring 4.8 x 1.6 x 2.1 cm.

Right, superior - 0.8 x 0.6 x 0.5 cm - mixed echogenic, solid.

Right, superior, posterior - 0.6 x cm - mixed echogenic, solid.

Right, mid, posterior - 0.8 x 0.8 x 0.8 cm - mixed echogenic,
partially cystic, partially solid.

Left thyroid lobe

Measurements: Normal in size measuring 4.5 x 1.4 x 2.0 cm.

Left, mid - 1.0 x 0.7 x 1.0 cm - mixed echogenic, solid.

Left, mid, inferior, posterior - 0.8 x 0.6 x 0.8 cm - mixed
echogenic, solid.

Isthmus

Thickness: Enlarged measures 0.8 cm in diameter.

Superior - 1.1 x 0.8 x 1.4 cm - mixed echogenic, solid, ill-defined,
potentially a pseudo nodule

Lymphadenopathy

None visualized.
IMPRESSION: Findings suggestive of multi nodular goiter. None of the discretely
measured bilateral thyroid nodules currently meet imaging criteria
to recommend percutaneous sampling. This recommendation follows the
consensus statement: Management of Thyroid Nodules Detected at US:
Society of Radiologists in Ultrasound Consensus Conference

## 2016-12-16 ENCOUNTER — Other Ambulatory Visit (INDEPENDENT_AMBULATORY_CARE_PROVIDER_SITE_OTHER): Payer: Medicare Other

## 2016-12-16 DIAGNOSIS — E1165 Type 2 diabetes mellitus with hyperglycemia: Secondary | ICD-10-CM | POA: Diagnosis not present

## 2016-12-16 DIAGNOSIS — Z794 Long term (current) use of insulin: Secondary | ICD-10-CM

## 2016-12-16 LAB — GLUCOSE, RANDOM: Glucose, Bld: 169 mg/dL — ABNORMAL HIGH (ref 70–99)

## 2016-12-16 LAB — HEMOGLOBIN A1C: Hgb A1c MFr Bld: 7.8 % — ABNORMAL HIGH (ref 4.6–6.5)

## 2016-12-19 ENCOUNTER — Other Ambulatory Visit: Payer: Self-pay

## 2016-12-19 ENCOUNTER — Encounter: Payer: Self-pay | Admitting: Endocrinology

## 2016-12-19 ENCOUNTER — Ambulatory Visit (INDEPENDENT_AMBULATORY_CARE_PROVIDER_SITE_OTHER): Payer: Medicare Other | Admitting: Endocrinology

## 2016-12-19 VITALS — BP 138/82 | HR 71 | Ht 71.0 in | Wt 195.0 lb

## 2016-12-19 DIAGNOSIS — Z794 Long term (current) use of insulin: Secondary | ICD-10-CM

## 2016-12-19 DIAGNOSIS — I10 Essential (primary) hypertension: Secondary | ICD-10-CM | POA: Diagnosis not present

## 2016-12-19 DIAGNOSIS — E1165 Type 2 diabetes mellitus with hyperglycemia: Secondary | ICD-10-CM

## 2016-12-19 MED ORDER — METFORMIN HCL ER 500 MG PO TB24: ORAL_TABLET | ORAL | 1 refills | Status: DC

## 2016-12-19 NOTE — Patient Instructions (Addendum)
More sugars after lunch or Bfst  Check blood sugars on waking up  3x weekly  Also check blood sugars about 2 hours after a meal and do this after different meals by rotation  Recommended blood sugar levels on waking up is 90-130 and about 2 hours after meal is 130-160  Please bring your blood sugar monitor to each visit, thank you  TOUJEO 14 units   Metformin 1 in am and 2 at dinner

## 2016-12-19 NOTE — Progress Notes (Signed)
Patient ID: Alan Lopez, male   DOB: [birth year], 71 y.o.   MRN: [id number]           Reason for Appointment: Follow-up for Type 2 Diabetes   History of Present Illness:          Date of diagnosis of type 2 diabetes mellitus :  1999      Past history:  He had modestly increase blood sugars at diagnosis and was started on metformin Subsequently glipizide was added to improve his control. He thinks that his blood sugars have been mostly poorly controlled over the last several years He had been on glipizide and metformin for years without any adjustment of his dosage of glipizide  Review of his A1c shows that it had ranged from 9-9.5 since 07/2013 Previously his PCP tried him on a sample of Jardiance 10 mg without significant improvement  Recent history:   Oral hypoglycemic drugs the patient is taking are: metformin ER 500 mg, 2 tablets twice a day, Actos 15 mg daily,     INJECTABLE drugs: Toujeo 12 units, Humalog 12 units ac supper, Victoza 1.2 mg at bedtime  Because of significant hyperglycemia in 6/17 he was started on mealtime insulin with Humalog at dinner  His last A1c was 7.8, previously 9.4 in June  Current blood sugar patterns and problems identified:  Since he was continued to have low normal readings later at night or low sugars during the night told to reduce her suppertime dose of Humalog but he still is taking same dose  Also because of diarrhea his metformin was reduced by 500 mg  He is concerned about the cost of Victoza but is consistently taking it now  FASTING blood sugars are higher than before and averaging about 160, he has not increased his Toujeo with this change  Taking readings only late at night and none after meals; these readings are mildly increased  He has not been able to exercise because of cold weather  His weight has leveled off  Side effects from medications have been: Mild  diarrhea from regular metformin  Compliance with the medical  regimen: fair  Hypoglycemia: never   Glucose monitoring:  done once a day usually or less        Glucometer:   Contour   Blood Glucose readings by monitor download shows  Mean values apply above for all meters except median for One Touch  PRE-MEAL Fasting Lunch Dinner Bedtime Overall  Glucose range: 136-205    127-228    Mean/median: 162    174  166     Self-care: The diet that the patient has been following is: tries to limit high carbohydrate foods. eating out about 4 times a week     Typical meal intake: Breakfast is Recently oatmeal and banana .  Sometimes will have fried food Usually avoiding drinks with sugar                Dietician visit, most recent:never CDE visit: 01/2016               Exercise:  some walking, Less recently   Weight history: Maximum weight 215 previously  Wt Readings from Last 3 Encounters:  12/19/16 195 lb (88.5 kg)  09/12/16 195 lb 3.2 oz (88.5 kg)  08/20/16 195 lb 9.6 oz (88.7 kg)    Glycemic control: last A1c 9.2 done on 02/13/15 Microalbumin ratio normal in 3/16 from PCP    Lab Results  Component Value Date  HGBA1C 7.8 (H) 12/16/2016   HGBA1C 9.4 (H) 05/29/2016   HGBA1C 7.5 (H) 10/31/2015   Lab Results  Component Value Date   MICROALBUR 1.0 10/31/2015   LDLCALC 49 10/31/2015   CREATININE 0.89 09/09/2016    Lab on 12/16/2016  Component Date Value Ref Range Status  . Hgb A1c MFr Bld 12/16/2016 7.8* 4.6 - 6.5 % Final  . Glucose, Bld 12/16/2016 169* 70 - 99 mg/dL Final      Allergies as of 12/19/2016      Reactions   Codeine Other (See Comments)   Patient doesn't like how it makes him feel, "spaced out, not together"      Medication List       Accurate as of 12/19/16 10:54 AM. Always use your most recent med list.          aspirin EC 81 MG tablet Take 81 mg by mouth daily.   atorvastatin 80 MG tablet Commonly known as:  LIPITOR Take 40 mg by mouth daily.   BAYER MICROLET LANCETS lancets Use as instructed to check  blood sugar 2 times per day dx code E11.65   calcium carbonate 500 MG chewable tablet Commonly known as:  TUMS - dosed in mg elemental calcium Chew 1 tablet by mouth daily as needed.   cholecalciferol 1000 units tablet Commonly known as:  VITAMIN D Take 1,000 Units by mouth daily.   clopidogrel 75 MG tablet Commonly known as:  PLAVIX Take 1 tablet (75 mg total) by mouth daily.   fexofenadine 180 MG tablet Commonly known as:  ALLEGRA Take 180 mg by mouth daily as needed for allergies (for seasonal allergies). Reported on 01/29/2016   finasteride 5 MG tablet Commonly known as:  PROSCAR Take 5 mg by mouth daily.   fluticasone 50 MCG/ACT nasal spray Commonly known as:  FLONASE Place 2 sprays into both nostrils daily as needed for allergies (for seasonal allergies). Reported on 01/29/2016   glucose blood test strip Commonly known as:  BAYER CONTOUR NEXT TEST Use as instructed to check blood sugar 2 times per day dx code E11.65   Insulin Glargine 300 UNIT/ML Sopn Commonly known as:  TOUJEO SOLOSTAR Inject 16 Units into the skin at bedtime.   insulin lispro 100 UNIT/ML KiwkPen Commonly known as:  HUMALOG Inject 6 units before each meal.   Insulin Pen Needle 32G X 4 MM Misc Use Three per day to inject insulin   liraglutide 18 MG/3ML Sopn Commonly known as:  VICTOZA Inject 0.2 mLs (1.2 mg total) into the skin daily.   losartan 25 MG tablet Commonly known as:  COZAAR Take 0.5 tablets (12.5 mg total) by mouth daily.   metFORMIN 500 MG 24 hr tablet Commonly known as:  GLUCOPHAGE-XR TAKE 2 TABLETS BY MOUTH IN THE MORNING AND 2 TABLETS BY MOUTH IN THE EVENING   metoprolol tartrate 25 MG tablet Commonly known as:  LOPRESSOR TAKE 1 TABLET BY MOUTH TWICE DAILY   multivitamin with minerals Tabs tablet Take 1 tablet by mouth daily.   pioglitazone 15 MG tablet Commonly known as:  ACTOS TAKE 1 TABLET BY MOUTH EVERY DAY       Allergies:  Allergies  Allergen Reactions  .  Codeine Other (See Comments)    Patient doesn't like how it makes him feel, "spaced out, not together"    Past Medical History:  Diagnosis Date  . 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 HTN (hypertension)   . Hyperlipidemia   . Hypertrophy (benign)  of prostate   . Multinodular goiter 04/12/2015  . Non-insulin dependent type 2 diabetes mellitus ([place])   . Status post non-ST elevation myocardial infarction (NSTEMI)    CATH  . Tobacco abuse   . Type II diabetes mellitus, uncontrolled ([place]) 04/12/2015    Past Surgical History:  Procedure Laterality Date  . CORONARY ANGIOPLASTY WITH STENT PLACEMENT      Family History  Problem Relation Age of Onset  . Diabetes Mother   . Diabetes Brother   . Cancer Brother   . Heart disease Neg Hx     Social History:  reports that he quit smoking about a year ago. He has never used smokeless tobacco. He reports that he does not drink alcohol or use drugs.    Review of Systems    Lipid history:  Followed by cardiologist, since his MI he has been on 80 mg Lipitor    Lab Results  Component Value Date   CHOL 120 10/31/2015   HDL 56.40 10/31/2015   LDLCALC 49 10/31/2015   TRIG 72.0 10/31/2015   CHOLHDL 2 10/31/2015           Eyes: He has had blindness in his left eye from old injury  Most recent eye exam was ?  1/16 and was reportedly normal.     Hypertension: Currently only on 25 mg of losartan and also metoprolol  THYROID: He was found to have a 2 cm nodule on his left thyroid on his exam but ultrasound showed multinodular goiter with mostly small nodules and the largest nodule on the left side of 1.4 cm has the appearance of a pseudo- nodule Previous TSH normal in 2015   LABS:  Lab on 12/16/2016  Component Date Value Ref Range Status  . Hgb A1c MFr Bld 12/16/2016 7.8* 4.6 - 6.5 % Final  . Glucose, Bld 12/16/2016 169* 70 - 99 mg/dL Final    Physical Examination:  BP 138/82   Pulse 71   Ht 5\' 11"  (1.803 m)   Wt 195 lb (88.5 kg)    SpO2 96%   BMI 27.20 kg/m      ASSESSMENT:  Diabetes type 2, uncontrolled  See history of present illness for detailed discussion of his current management, blood sugar patterns and problems identified  His A1c is still high at 7.8 Blood sugars are relatively high fasting compared to last time and may be related to reduce metformin However also he has not done much exercise He is not checking readings postprandially and only fasting and bedtime Also bedtime readings are relatively high  Complaining about cost of Victoza and not clear if he can continue this. Reviewed his diet and appears to be getting no protein at breakfast  PLAN:   He will increase Toujeo by case 2 units to get fasting blood sugars under target range of 130  He probably needs to take more Humalog at suppertime based on his carbohydrate intake, discussed needing to check readings 2 hours after eating out and at bedtime  Start checking blood sugars by rotation after various meals  Encouraged him to increase activity such as going to the gym  No change in metformin as yet  Have a protein at breakfast, discussed options  No change in antihypertensives  Needs follow-up lipids, last LDL was 26  Patient Instructions  More sugars after lunch or Bfst    Counseling time on subjects discussed above is over 50% of today's 25 minute visit     Alan Lopez  12/19/2016, 10:54 AM   Note: This office note was prepared with Dragon voice recognition system technology. Any transcriptional errors that result from this process are unintentional.

## 2017-01-04 ENCOUNTER — Other Ambulatory Visit: Payer: Self-pay | Admitting: Cardiology

## 2017-01-15 ENCOUNTER — Telehealth: Payer: Self-pay | Admitting: Internal Medicine

## 2017-01-15 NOTE — Telephone Encounter (Signed)
Patient is requesting to become your patient. His doctor retired, and you recommend. Please advise. Thank you. :)

## 2017-01-19 ENCOUNTER — Other Ambulatory Visit: Payer: Self-pay | Admitting: Cardiology

## 2017-01-20 ENCOUNTER — Other Ambulatory Visit: Payer: Self-pay | Admitting: Endocrinology

## 2017-01-20 MED ORDER — PIOGLITAZONE HCL 15 MG PO TABS: 15.0000 mg | ORAL_TABLET | Freq: Every day | ORAL | 0 refills | Status: DC

## 2017-01-20 NOTE — Telephone Encounter (Signed)
Patient has called back in regard?  °

## 2017-01-21 NOTE — Telephone Encounter (Signed)
yes

## 2017-01-21 NOTE — Telephone Encounter (Signed)
Got scheduled  °

## 2017-01-23 ENCOUNTER — Other Ambulatory Visit: Payer: Self-pay | Admitting: Endocrinology

## 2017-01-28 DIAGNOSIS — E119 Type 2 diabetes mellitus without complications: Secondary | ICD-10-CM | POA: Diagnosis not present

## 2017-01-28 LAB — HM DIABETES EYE EXAM

## 2017-01-29 ENCOUNTER — Other Ambulatory Visit (INDEPENDENT_AMBULATORY_CARE_PROVIDER_SITE_OTHER): Payer: Medicare Other

## 2017-01-29 ENCOUNTER — Encounter: Payer: Self-pay | Admitting: Internal Medicine

## 2017-01-29 ENCOUNTER — Ambulatory Visit (INDEPENDENT_AMBULATORY_CARE_PROVIDER_SITE_OTHER): Payer: Medicare Other | Admitting: Internal Medicine

## 2017-01-29 VITALS — BP 130/70 | HR 60 | Temp 97.7°F | Resp 16 | Ht 71.0 in | Wt 197.0 lb

## 2017-01-29 DIAGNOSIS — E785 Hyperlipidemia, unspecified: Secondary | ICD-10-CM

## 2017-01-29 DIAGNOSIS — E118 Type 2 diabetes mellitus with unspecified complications: Secondary | ICD-10-CM

## 2017-01-29 DIAGNOSIS — I2583 Coronary atherosclerosis due to lipid rich plaque: Secondary | ICD-10-CM

## 2017-01-29 DIAGNOSIS — R351 Nocturia: Secondary | ICD-10-CM

## 2017-01-29 DIAGNOSIS — Z794 Long term (current) use of insulin: Secondary | ICD-10-CM | POA: Diagnosis not present

## 2017-01-29 DIAGNOSIS — N62 Hypertrophy of breast: Secondary | ICD-10-CM | POA: Diagnosis not present

## 2017-01-29 DIAGNOSIS — I2510 Atherosclerotic heart disease of native coronary artery without angina pectoris: Secondary | ICD-10-CM

## 2017-01-29 DIAGNOSIS — N401 Enlarged prostate with lower urinary tract symptoms: Secondary | ICD-10-CM | POA: Diagnosis not present

## 2017-01-29 DIAGNOSIS — R972 Elevated prostate specific antigen [PSA]: Secondary | ICD-10-CM | POA: Diagnosis not present

## 2017-01-29 LAB — CBC WITH DIFFERENTIAL/PLATELET
BASOS PCT: 0.6 % (ref 0.0–3.0)
Basophils Absolute: 0.1 10*3/uL (ref 0.0–0.1)
EOS PCT: 2.9 % (ref 0.0–5.0)
Eosinophils Absolute: 0.3 10*3/uL (ref 0.0–0.7)
HCT: 42.2 % (ref 39.0–52.0)
Hemoglobin: 14.5 g/dL (ref 13.0–17.0)
Lymphocytes Relative: 24.7 % (ref 12.0–46.0)
Lymphs Abs: 2.2 10*3/uL (ref 0.7–4.0)
MCHC: 34.3 g/dL (ref 30.0–36.0)
MCV: 92.9 fl (ref 78.0–100.0)
MONO ABS: 0.7 10*3/uL (ref 0.1–1.0)
Monocytes Relative: 8.5 % (ref 3.0–12.0)
Neutro Abs: 5.5 10*3/uL (ref 1.4–7.7)
Neutrophils Relative %: 63.3 % (ref 43.0–77.0)
PLATELETS: 203 10*3/uL (ref 150.0–400.0)
RBC: 4.54 Mil/uL (ref 4.22–5.81)
RDW: 13.6 % (ref 11.5–15.5)
WBC: 8.8 10*3/uL (ref 4.0–10.5)

## 2017-01-29 LAB — COMPREHENSIVE METABOLIC PANEL
ALK PHOS: 57 U/L (ref 39–117)
ALT: 44 U/L (ref 0–53)
AST: 21 U/L (ref 0–37)
Albumin: 4.1 g/dL (ref 3.5–5.2)
BUN: 16 mg/dL (ref 6–23)
CO2: 28 meq/L (ref 19–32)
Calcium: 9.5 mg/dL (ref 8.4–10.5)
Chloride: 106 mEq/L (ref 96–112)
Creatinine, Ser: 0.8 mg/dL (ref 0.40–1.50)
GFR: 101 mL/min (ref >60.00)
GLUCOSE: 150 mg/dL — AB (ref 70–99)
POTASSIUM: 4.3 meq/L (ref 3.5–5.1)
SODIUM: 138 meq/L (ref 135–145)
TOTAL PROTEIN: 6.5 g/dL (ref 6.0–8.3)
Total Bilirubin: 0.9 mg/dL (ref 0.2–1.2)

## 2017-01-29 LAB — LIPID PANEL
Cholesterol: 84 mg/dL (ref 0–200)
HDL: 45.4 mg/dL (ref >39.00)
LDL Cholesterol: 24 mg/dL (ref 0–99)
NONHDL: 38.43
Total CHOL/HDL Ratio: 2
Triglycerides: 70 mg/dL (ref 0.0–149.0)
VLDL: 14 mg/dL (ref 0.0–40.0)

## 2017-01-29 LAB — TSH: TSH: 1.18 u[IU]/mL (ref 0.35–4.50)

## 2017-01-29 LAB — URINALYSIS, ROUTINE W REFLEX MICROSCOPIC
Bilirubin Urine: NEGATIVE
Hgb urine dipstick: NEGATIVE
Ketones, ur: NEGATIVE
Leukocytes, UA: NEGATIVE
Nitrite: NEGATIVE
PH: 6 (ref 5.0–8.0)
RBC / HPF: NONE SEEN (ref 0–?)
Specific Gravity, Urine: 1.02 (ref 1.000–1.030)
TOTAL PROTEIN, URINE-UPE24: NEGATIVE
Urine Glucose: NEGATIVE
Urobilinogen, UA: 0.2 (ref 0.0–1.0)
WBC, UA: NONE SEEN (ref 0–?)

## 2017-01-29 LAB — MICROALBUMIN / CREATININE URINE RATIO
CREATININE, U: 105.7 mg/dL
MICROALB/CREAT RATIO: 0.7 mg/g (ref 0.0–30.0)
Microalb, Ur: <0.7 mg/dL (ref 0.0–1.9)

## 2017-01-29 NOTE — Progress Notes (Signed)
Subjective:  Patient ID: Alan Lopez, male    DOB: 04/24/1946  Age: 71 y.o. MRN: [id number]  CC: Hypertension; Hyperlipidemia; and Diabetes   HPI Alan Lopez presents for f/up - he is here to establish, he needs a new PCP. He sees urology for BPH and has a PSA ordered for later today. He takes finasteride and now complains of painless left breast enlargement for the last few months. He offers no other complaints.  History Roux has a past medical history of Blindness of left eye (04/12/2015); HTN (hypertension); Hyperlipidemia; Hypertrophy (benign) of prostate; Multinodular goiter (04/12/2015); Non-insulin dependent type 2 diabetes mellitus ([place]); Status post non-ST elevation myocardial infarction (NSTEMI); Tobacco abuse; and Type II diabetes mellitus, uncontrolled ([place]) (04/12/2015).   He has a past surgical history that includes Coronary angioplasty with stent.   His family history includes Cancer in his brother; Diabetes in his brother and mother.He reports that he quit smoking about 13 months ago. He has never used smokeless tobacco. He reports that he does not drink alcohol or use drugs.  Outpatient Medications Prior to Visit  Medication Sig Dispense Refill  . aspirin EC 81 MG tablet Take 81 mg by mouth daily.    Marland Kitchen atorvastatin (LIPITOR) 80 MG tablet Take 40 mg by mouth daily.    Marland Kitchen BAYER MICROLET LANCETS lancets Use as instructed to check blood sugar 2 times per day dx code E11.65 100 each 3  . cholecalciferol (VITAMIN D) 1000 UNITS tablet Take 1,000 Units by mouth daily.    . fexofenadine (ALLEGRA) 180 MG tablet Take 180 mg by mouth daily as needed for allergies (for seasonal allergies). Reported on 01/29/2016    . finasteride (PROSCAR) 5 MG tablet Take 5 mg by mouth daily.  3  . fluticasone (FLONASE) 50 MCG/ACT nasal spray Place 2 sprays into both nostrils daily as needed for allergies (for seasonal allergies). Reported on 01/29/2016    . glucose blood (BAYER CONTOUR NEXT TEST)  test strip Use as instructed to check blood sugar 2 times per day dx code E11.65 100 each 3  . Insulin Glargine (TOUJEO SOLOSTAR) 300 UNIT/ML SOPN Inject 16 Units into the skin at bedtime. 4.5 mL 2  . insulin lispro (HUMALOG) 100 UNIT/ML KiwkPen Inject 6 units before each meal. (Patient taking differently: Inject 12 Units into the skin. Inject 12 units before each meal. Patient was only taking once a day before supper) 15 mL 4  . Insulin Pen Needle 32G X 4 MM MISC Use Three per day to inject insulin 100 each 5  . Liraglutide (VICTOZA) 18 MG/3ML SOPN Inject 0.2 mLs (1.2 mg total) into the skin daily. 9 mL 2  . losartan (COZAAR) 25 MG tablet TAKE 1/2 TABLET(12.5 MG) BY MOUTH DAILY 45 tablet 1  . metFORMIN (GLUCOPHAGE-XR) 500 MG 24 hr tablet TAKE 2 TABLETS BY MOUTH IN THE MORNING AND 1 TABLETS BY MOUTH IN THE EVENING 360 tablet 1  . metoprolol tartrate (LOPRESSOR) 25 MG tablet TAKE 1 TABLET BY MOUTH TWICE DAILY 60 tablet 6  . pioglitazone (ACTOS) 15 MG tablet Take 1 tablet (15 mg total) by mouth daily. 90 tablet 0  . VICTOZA 18 MG/3ML SOPN INJECT 1.2 MG UNDER THE SKIN DAILY 9 mL 0  . calcium carbonate (TUMS - DOSED IN MG ELEMENTAL CALCIUM) 500 MG chewable tablet Chew 1 tablet by mouth daily as needed.     . clopidogrel (PLAVIX) 75 MG tablet Take 1 tablet (75 mg total) by mouth daily. 90 tablet  3  . Multiple Vitamin (MULTIVITAMIN WITH MINERALS) TABS tablet Take 1 tablet by mouth daily.     No facility-administered medications prior to visit.     ROS Review of Systems  Constitutional: Negative.  Negative for activity change, appetite change, diaphoresis, fatigue and unexpected weight change.  HENT: Negative.   Eyes: Negative for visual disturbance.  Respiratory: Negative for cough, chest tightness, shortness of breath and wheezing.   Cardiovascular: Negative for chest pain and leg swelling.  Gastrointestinal: Negative for abdominal pain, constipation, diarrhea, nausea and vomiting.  Endocrine:  Negative.   Genitourinary: Negative.  Negative for difficulty urinating.  Musculoskeletal: Negative for back pain, myalgias and neck pain.  Skin: Negative.   Allergic/Immunologic: Negative.   Neurological: Negative.  Negative for dizziness, weakness and headaches.  Hematological: Negative.  Negative for adenopathy. Does not bruise/bleed easily.    Objective:  BP 130/70 (BP Location: Left Arm, Patient Position: Sitting, Cuff Size: Normal)   Pulse 60   Temp 97.7 F (36.5 C) (Oral)   Resp 16   Ht 5\' 11"  (1.803 m)   Wt 197 lb (89.4 kg)   SpO2 98%   BMI 27.48 kg/m   Physical Exam  Constitutional: He is oriented to person, place, and time. No distress.  HENT:  Mouth/Throat: Oropharynx is clear and moist. No oropharyngeal exudate.  Eyes: Conjunctivae are normal. Right eye exhibits no discharge. Left eye exhibits no discharge. No scleral icterus.  Neck: Normal range of motion. Neck supple. No JVD present. No tracheal deviation present. No thyromegaly present.  Cardiovascular: Normal rate, regular rhythm, normal heart sounds and intact distal pulses.  Exam reveals no gallop and no friction rub.   No murmur heard. Pulmonary/Chest: Effort normal and breath sounds normal. No stridor. No respiratory distress. He has no decreased breath sounds. He has no wheezes. He has no rhonchi. He has no rales. He exhibits no mass and no tenderness. Right breast exhibits no inverted nipple, no mass, no nipple discharge, no skin change and no tenderness. Left breast exhibits no inverted nipple, no mass, no nipple discharge, no skin change and no tenderness. Breasts are asymmetrical.    Abdominal: Soft. Bowel sounds are normal. He exhibits no distension and no mass. There is no tenderness. There is no rebound and no guarding.  Musculoskeletal: Normal range of motion. He exhibits no edema, tenderness or deformity.  Lymphadenopathy:    He has no cervical adenopathy.    He has no axillary adenopathy.        Right axillary: No pectoral and no lateral adenopathy present.       Left axillary: No pectoral and no lateral adenopathy present.      Right: No supraclavicular adenopathy present.       Left: No supraclavicular adenopathy present.  Neurological: He is oriented to person, place, and time.  Skin: Skin is warm and dry. No rash noted. He is not diaphoretic. No erythema.  Vitals reviewed.   Lab Results  Component Value Date   WBC 8.8 01/29/2017   HGB 14.5 01/29/2017   HCT 42.2 01/29/2017   PLT 203.0 01/29/2017   GLUCOSE 150 (H) 01/29/2017   CHOL 84 01/29/2017   TRIG 70.0 01/29/2017   HDL 45.40 01/29/2017   LDLCALC 24 01/29/2017   ALT 44 01/29/2017   AST 21 01/29/2017   NA 138 01/29/2017   K 4.3 01/29/2017   CL 106 01/29/2017   CREATININE 0.80 01/29/2017   BUN 16 01/29/2017   CO2 28 01/29/2017  TSH 1.18 01/29/2017   HGBA1C 7.8 (H) 12/16/2016   MICROALBUR <0.7 01/29/2017    Assessment & Plan:   Josecruz was seen today for hypertension, hyperlipidemia and diabetes.  Diagnoses and all orders for this visit:  Coronary artery disease due to lipid rich plaque- He has had no recent episodes of angina. Will continue risk factor modification with blood sugar control, and statin therapy to reduce his LDL. -     Lipid panel; Future -     CBC with Differential/Platelet; Future  Hyperlipidemia LDL goal <70- he has achieved his LDL goal is doing well on the statin. -     Lipid panel; Future -     TSH; Future  Type 2 diabetes mellitus with complication, with long-term current use of insulin ([place])- his blood sugars are not adequately well controlled, this is treated by endocrinology. We'll continue the ARB for renal protection. -     Comprehensive metabolic panel; Future -     Urinalysis, Routine w reflex microscopic; Future -     Microalbumin / creatinine urine ratio; Future  BPH associated with nocturia- followed by urology  Gynecomastia, male- this is most likely explained by the  finasteride therapy, will order an ultrasound to be certain there are no concerns for male breast cancer. -     US BREAST LTD UNI LEFT INC AXILLA; Future   I have discontinued Mr. Robbins's multivitamin with minerals, calcium carbonate, and clopidogrel. I am also having him maintain his finasteride, cholecalciferol, aspirin EC, fexofenadine, fluticasone, glucose blood, BAYER MICROLET LANCETS, insulin lispro, liraglutide, Insulin Glargine, atorvastatin, Insulin Pen Needle, metFORMIN, metoprolol tartrate, losartan, pioglitazone, and VICTOZA.  No orders of the defined types were placed in this encounter.    Follow-up: Return in about 6 months (around 07/29/2017).  [name], MD

## 2017-01-29 NOTE — Progress Notes (Signed)
Pre visit review using our clinic review tool, if applicable. No additional management support is needed unless otherwise documented below in the visit note. 

## 2017-01-29 NOTE — Patient Instructions (Signed)

## 2017-01-30 ENCOUNTER — Ambulatory Visit: Payer: Medicare Other | Admitting: Internal Medicine

## 2017-01-31 ENCOUNTER — Other Ambulatory Visit: Payer: Self-pay | Admitting: Internal Medicine

## 2017-01-31 DIAGNOSIS — N62 Hypertrophy of breast: Secondary | ICD-10-CM

## 2017-02-11 ENCOUNTER — Encounter: Payer: Self-pay | Admitting: Internal Medicine

## 2017-02-11 NOTE — Progress Notes (Unsigned)
Results entered and sent to scan  

## 2017-02-12 ENCOUNTER — Telehealth: Payer: Self-pay

## 2017-02-12 ENCOUNTER — Ambulatory Visit
Admission: RE | Admit: 2017-02-12 | Discharge: 2017-02-12 | Disposition: A | Discharge status: Home / Self Care | Payer: Medicare Other | Source: Ambulatory Visit | Attending: Internal Medicine | Admitting: Internal Medicine

## 2017-02-12 DIAGNOSIS — R928 Other abnormal and inconclusive findings on diagnostic imaging of breast: Secondary | ICD-10-CM | POA: Diagnosis not present

## 2017-02-12 DIAGNOSIS — N62 Hypertrophy of breast: Secondary | ICD-10-CM

## 2017-02-12 NOTE — Telephone Encounter (Signed)
Cologuard ordered via [employer] [id number]

## 2017-03-10 DIAGNOSIS — R972 Elevated prostate specific antigen [PSA]: Secondary | ICD-10-CM | POA: Diagnosis not present

## 2017-03-10 DIAGNOSIS — N400 Enlarged prostate without lower urinary tract symptoms: Secondary | ICD-10-CM | POA: Diagnosis not present

## 2017-03-17 ENCOUNTER — Ambulatory Visit: Payer: Medicare Other | Admitting: Family Medicine

## 2017-03-17 ENCOUNTER — Other Ambulatory Visit (INDEPENDENT_AMBULATORY_CARE_PROVIDER_SITE_OTHER): Payer: Medicare Other

## 2017-03-17 ENCOUNTER — Other Ambulatory Visit: Payer: Self-pay | Admitting: Endocrinology

## 2017-03-17 DIAGNOSIS — E1165 Type 2 diabetes mellitus with hyperglycemia: Secondary | ICD-10-CM

## 2017-03-17 DIAGNOSIS — Z794 Long term (current) use of insulin: Principal | ICD-10-CM

## 2017-03-17 LAB — BASIC METABOLIC PANEL
BUN: 16 mg/dL (ref 6–23)
CALCIUM: 9.1 mg/dL (ref 8.4–10.5)
CO2: 24 mEq/L (ref 19–32)
CREATININE: 0.81 mg/dL (ref 0.40–1.50)
Chloride: 107 mEq/L (ref 96–112)
GFR: 99.53 mL/min (ref >60.00)
Glucose, Bld: 162 mg/dL — ABNORMAL HIGH (ref 70–99)
POTASSIUM: 4.1 meq/L (ref 3.5–5.1)
Sodium: 136 mEq/L (ref 135–145)

## 2017-03-17 LAB — HEMOGLOBIN A1C: Hgb A1c MFr Bld: 8.4 % — ABNORMAL HIGH (ref 4.6–6.5)

## 2017-03-20 ENCOUNTER — Ambulatory Visit (INDEPENDENT_AMBULATORY_CARE_PROVIDER_SITE_OTHER): Payer: Medicare Other | Admitting: Endocrinology

## 2017-03-20 ENCOUNTER — Encounter: Payer: Self-pay | Admitting: Endocrinology

## 2017-03-20 VITALS — BP 126/62 | HR 68 | Ht 71.0 in | Wt 197.0 lb

## 2017-03-20 DIAGNOSIS — I2510 Atherosclerotic heart disease of native coronary artery without angina pectoris: Secondary | ICD-10-CM

## 2017-03-20 DIAGNOSIS — Z794 Long term (current) use of insulin: Secondary | ICD-10-CM | POA: Diagnosis not present

## 2017-03-20 DIAGNOSIS — I2583 Coronary atherosclerosis due to lipid rich plaque: Secondary | ICD-10-CM

## 2017-03-20 DIAGNOSIS — E1165 Type 2 diabetes mellitus with hyperglycemia: Secondary | ICD-10-CM

## 2017-03-20 MED ORDER — INSULIN GLARGINE 300 UNIT/ML ~~LOC~~ SOPN: 16.0000 [IU] | PEN_INJECTOR | Freq: Every day | SUBCUTANEOUS | 2 refills | Status: DC

## 2017-03-20 MED ORDER — LIRAGLUTIDE 18 MG/3ML ~~LOC~~ SOPN: PEN_INJECTOR | SUBCUTANEOUS | 1 refills | Status: DC

## 2017-03-20 NOTE — Patient Instructions (Addendum)
Check blood sugars on waking up  4x weekly  Also check blood sugars about 2 hours after a meal and do this after different meals by rotation  Recommended blood sugar levels on waking up is 90-130 and about 2 hours after meal is 130-160  Please bring your blood sugar monitor to each visit, thank you  Toujeo 16 units daily and bring am sugar as above  Humalog 8 at dinner, 6 in am  Metformin 2 twice daily  Check cost of [employer]ultophy

## 2017-03-20 NOTE — Progress Notes (Signed)
Patient ID: Alan Lopez, male   DOB: 1946-03-20, 71 y.o.   MRN: [id number]           Reason for Appointment: Follow-up for Type 2 Diabetes   History of Present Illness:          Date of diagnosis of type 2 diabetes mellitus :  1999      Past history:  He had modestly increase blood sugars at diagnosis and was started on metformin Subsequently glipizide was added to improve his control. He thinks that his blood sugars have been mostly poorly controlled over the last several years He had been on glipizide and metformin for years without any adjustment of his dosage of glipizide  Review of his A1c shows that it had ranged from 9-9.5 since 07/2013 Previously his PCP tried him on a sample of Jardiance 10 mg without significant improvement  Recent history:   Oral hypoglycemic drugs the patient is taking are: metformin ER 500 mg, 2 tablets twice a day   INJECTABLE drugs: Toujeo 12 units, Humalog 6 units ac  bfst andsupper, Victoza 1.2 mg at bedtime  Because of significant hyperglycemia in 6/17 he was started on mealtime insulin with Humalog at dinner  His A1c is now up to 8.4, previously 7.8  Current blood sugar patterns and problems identified:  He has not been following instructions for his insulin doses  He was told to increase his Toujeo up to 16 units because of high fasting readings but he is still taking 12 units  Also he was supposed to be taking 12 units of Humalog at suppertime and is only taking 6 units  However he is taking 6 units of Humalog at breakfast also even though he has not checked readings after breakfast lately  Checking blood sugars mostly fasting and occasionally after supper  He is concerned about the cost of Victoza again  FASTING blood sugars are still about the same as before and averaging over 160  Blood sugars are slightly higher after supper and somewhat variable based on his diet  He said he does not get the time for exercise because of  taking care of his wife  Side effects from medications have been: Mild  diarrhea from regular metformin  Compliance with the medical regimen: fair  Hypoglycemia: never   Glucose monitoring:  done once a day usually or less        Glucometer:   Contour   Blood Glucose readings by monitor download shows  Mean values apply above for all meters except median for One Touch  PRE-MEAL Fasting Lunch Dinner Bedtime Overall  Glucose range: 1 37-180       Mean/median: 163    170   POST-MEAL PC Breakfast PC Lunch PC Dinner  Glucose range:  171  1 60-230   Mean/median:   194     Self-care: The diet that the patient has been following is: tries to limit high carbohydrate foods. eating out about 4 times a week     Typical meal intake: Breakfast is Recently cereal/meat .  Sometimes will have fried food Usually avoiding drinks with sugar                Dietician visit, most recent:never CDE visit: 01/2016               Exercise:  some walking   Weight history: Maximum weight 215 previously  Wt Readings from Last 3 Encounters:  03/20/17 197 lb (89.4 kg)  01/29/17  197 lb (89.4 kg)  12/19/16 195 lb (88.5 kg)    Glycemic control: last A1c 9.2 done on 02/13/15 Microalbumin ratio normal in 3/16 from PCP    Lab Results  Component Value Date   HGBA1C 8.4 (H) 03/17/2017   HGBA1C 7.8 (H) 12/16/2016   HGBA1C 9.4 (H) 05/29/2016   Lab Results  Component Value Date   MICROALBUR <0.7 01/29/2017   [place] 24 01/29/2017   CREATININE 0.81 03/17/2017    Lab on 03/17/2017  Component Date Value Ref Range Status  . Hgb A1c MFr Bld 03/17/2017 8.4* 4.6 - 6.5 % Final  . Sodium 03/17/2017 136  135 - 145 mEq/L Final  . Potassium 03/17/2017 4.1  3.5 - 5.1 mEq/L Final  . Chloride 03/17/2017 107  96 - 112 mEq/L Final  . CO2 03/17/2017 24  19 - 32 mEq/L Final  . Glucose, Bld 03/17/2017 162* 70 - 99 mg/dL Final  . BUN 03/17/2017 16  6 - 23 mg/dL Final  . Creatinine, Ser 03/17/2017 0.81  0.40 - 1.50 mg/dL  Final  . Calcium 03/17/2017 9.1  8.4 - 10.5 mg/dL Final  . GFR 03/17/2017 99.53  >60.00 mL/min Final      Allergies as of 03/20/2017      Reactions   Codeine Other (See Comments)   Patient doesn't like how it makes him feel, "spaced out, not together"      Medication List       Accurate as of 03/20/17  4:51 PM. Always use your most recent med list.          aspirin EC 81 MG tablet Take 81 mg by mouth daily.   atorvastatin 80 MG tablet Commonly known as:  LIPITOR Take 40 mg by mouth daily.   BAYER MICROLET LANCETS lancets Use as instructed to check blood sugar 2 times per day dx code E11.65   cholecalciferol 1000 units tablet Commonly known as:  VITAMIN D Take 1,000 Units by mouth daily.   fexofenadine 180 MG tablet Commonly known as:  ALLEGRA Take 180 mg by mouth daily as needed for allergies (for seasonal allergies). Reported on 01/29/2016   finasteride 5 MG tablet Commonly known as:  PROSCAR Take 5 mg by mouth daily.   fluticasone 50 MCG/ACT nasal spray Commonly known as:  FLONASE Place 2 sprays into both nostrils daily as needed for allergies (for seasonal allergies). Reported on 01/29/2016   glucose blood test strip Commonly known as:  BAYER CONTOUR NEXT TEST Use as instructed to check blood sugar 2 times per day dx code E11.65   Insulin Glargine 300 UNIT/ML Sopn Commonly known as:  TOUJEO SOLOSTAR Inject 16 Units into the skin at bedtime.   insulin lispro 100 UNIT/ML KiwkPen Commonly known as:  HUMALOG Inject 6 units before each meal.   Insulin Pen Needle 32G X 4 MM Misc Use Three per day to inject insulin   liraglutide 18 MG/3ML Sopn Commonly known as:  VICTOZA INJECT 1.2 MG UNDER THE SKIN DAILY   losartan 25 MG tablet Commonly known as:  COZAAR TAKE 1/2 TABLET(12.5 MG) BY MOUTH DAILY   metFORMIN 500 MG 24 hr tablet Commonly known as:  GLUCOPHAGE-XR TAKE 2 TABLETS BY MOUTH IN THE MORNING AND 1 TABLETS BY MOUTH IN THE EVENING   metoprolol  tartrate 25 MG tablet Commonly known as:  LOPRESSOR TAKE 1 TABLET BY MOUTH TWICE DAILY   pioglitazone 15 MG tablet Commonly known as:  ACTOS Take 1 tablet (15 mg total) by  mouth daily.       Allergies:  Allergies  Allergen Reactions  . Codeine Other (See Comments)    Patient doesn't like how it makes him feel, "spaced out, not together"    Past Medical History:  Diagnosis Date  . 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 HTN (hypertension)   . Hyperlipidemia   . Hypertrophy (benign) of prostate   . Multinodular goiter 04/12/2015  . Non-insulin dependent type 2 diabetes mellitus ([place])   . Status post non-ST elevation myocardial infarction (NSTEMI)    CATH  . Tobacco abuse   . Type II diabetes mellitus, uncontrolled ([place]) 04/12/2015    Past Surgical History:  Procedure Laterality Date  . CORONARY ANGIOPLASTY WITH STENT PLACEMENT      Family History  Problem Relation Age of Onset  . Diabetes Mother   . Diabetes Brother   . Cancer Brother   . Heart disease Neg Hx     Social History:  reports that he quit smoking about 15 months ago. He has never used smokeless tobacco. He reports that he does not drink alcohol or use drugs.    Review of Systems    Following is a copy of previous note:  Lipid history:  Followed by cardiologist, since his MI he has been on 80 mg Lipitor    Lab Results  Component Value Date   CHOL 84 01/29/2017   HDL 45.40 01/29/2017   [place] 24 01/29/2017   TRIG 70.0 01/29/2017   CHOLHDL 2 01/29/2017           Eyes: He has had blindness in his left eye from old injury  Most recent eye exam was ?  1/16 and was reportedly normal.     Hypertension: Currently only on 25 mg of losartan and also metoprolol  THYROID: He was found to have a 2 cm nodule on his left thyroid on his exam but ultrasound showed multinodular goiter with mostly small nodules and the largest nodule on the left side of 1.4 cm has the appearance of a pseudo- nodule Previous TSH  normal in 2015   LABS:  Lab on 03/17/2017  Component Date Value Ref Range Status  . Hgb A1c MFr Bld 03/17/2017 8.4* 4.6 - 6.5 % Final  . Sodium 03/17/2017 136  135 - 145 mEq/L Final  . Potassium 03/17/2017 4.1  3.5 - 5.1 mEq/L Final  . Chloride 03/17/2017 107  96 - 112 mEq/L Final  . CO2 03/17/2017 24  19 - 32 mEq/L Final  . Glucose, Bld 03/17/2017 162* 70 - 99 mg/dL Final  . BUN 03/17/2017 16  6 - 23 mg/dL Final  . Creatinine, Ser 03/17/2017 0.81  0.40 - 1.50 mg/dL Final  . Calcium 03/17/2017 9.1  8.4 - 10.5 mg/dL Final  . GFR 03/17/2017 99.53  >60.00 mL/min Final    Physical Examination:  BP 126/62   Pulse 68   Ht 5\' 11"  (1.803 m)   Wt 197 lb (89.4 kg)   BMI 27.48 kg/m      ASSESSMENT:  Diabetes type 2, uncontrolled  See history of present illness for detailed discussion of his current management, blood sugar patterns and problems identified  His A1c is Higher at 8.4 He is getting more insulin deficient and is needing higher doses of insulin However he did not increase his insulin doses as directed and is taking less Humalog at suppertime than previously Also not exercising  Discussed the actions of both basal and bolus insulins Discussed  need to check blood sugars more consistently after various meals and not just randomly occasionally after supper He discussed blood sugar targets at various times Recommended he continue Victoza since this is likely helping, otherwise he may need much higher doses of insulin  PLAN:   He will increase his Toujeo to 16 units  Discussed further adjusting it about once a week based on fasting blood sugar patterns and discuss fasting glucose sugar targets  He will increase his suppertime dose by at least 2 units and go up to 10 units also if needed if sugars are still over 150 at least  He will check the cost of Xultophy.  Continue 6 units of Humalog at breakfast but check more readings after breakfast and lunch  He will try to  take metformin 2 tablets twice a day and this may improve insulin sensitivity  Patient Instructions  Check blood sugars on waking up  4x weekly  Also check blood sugars about 2 hours after a meal and do this after different meals by rotation  Recommended blood sugar levels on waking up is 90-130 and about 2 hours after meal is 130-160  Please bring your blood sugar monitor to each visit, thank you  Toujeo 16 units daily and bring am sugar as above  Humalog 8 at dinner, 6 in am  Metformin 2 twice daily  Check cost of Xultophy   Counseling time on subjects discussed above is over 50% of today's 25 minute visit     Alan Lopez 03/20/2017, 4:51 PM   Note: This office note was prepared with Dragon voice recognition system technology. Any transcriptional errors that result from this process are unintentional.

## 2017-03-24 DIAGNOSIS — Z1212 Encounter for screening for malignant neoplasm of rectum: Secondary | ICD-10-CM | POA: Diagnosis not present

## 2017-03-24 DIAGNOSIS — Z1211 Encounter for screening for malignant neoplasm of colon: Secondary | ICD-10-CM | POA: Diagnosis not present

## 2017-03-24 LAB — COLOGUARD: COLOGUARD: NEGATIVE

## 2017-04-02 NOTE — Telephone Encounter (Signed)
Negative result has been abstracted.

## 2017-04-23 ENCOUNTER — Other Ambulatory Visit (INDEPENDENT_AMBULATORY_CARE_PROVIDER_SITE_OTHER): Payer: Medicare Other

## 2017-04-23 DIAGNOSIS — Z794 Long term (current) use of insulin: Secondary | ICD-10-CM

## 2017-04-23 DIAGNOSIS — E1165 Type 2 diabetes mellitus with hyperglycemia: Secondary | ICD-10-CM

## 2017-04-23 LAB — BASIC METABOLIC PANEL
BUN: 26 mg/dL — AB (ref 6–23)
CO2: 24 mEq/L (ref 19–32)
Calcium: 9.9 mg/dL (ref 8.4–10.5)
Chloride: 105 mEq/L (ref 96–112)
Creatinine, Ser: 1 mg/dL (ref 0.40–1.50)
GFR: 78.02 mL/min (ref >60.00)
Glucose, Bld: 165 mg/dL — ABNORMAL HIGH (ref 70–99)
POTASSIUM: 4.3 meq/L (ref 3.5–5.1)
SODIUM: 136 meq/L (ref 135–145)

## 2017-04-23 LAB — HEMOGLOBIN A1C: HEMOGLOBIN A1C: 8.5 % — AB (ref 4.6–6.5)

## 2017-04-29 ENCOUNTER — Other Ambulatory Visit: Payer: Medicare Other

## 2017-05-01 ENCOUNTER — Encounter: Payer: Self-pay | Admitting: Endocrinology

## 2017-05-01 ENCOUNTER — Ambulatory Visit (INDEPENDENT_AMBULATORY_CARE_PROVIDER_SITE_OTHER): Payer: Medicare Other | Admitting: Endocrinology

## 2017-05-01 VITALS — BP 120/82 | HR 74 | Ht 71.0 in | Wt 191.8 lb

## 2017-05-01 DIAGNOSIS — Z794 Long term (current) use of insulin: Secondary | ICD-10-CM | POA: Diagnosis not present

## 2017-05-01 DIAGNOSIS — I10 Essential (primary) hypertension: Secondary | ICD-10-CM

## 2017-05-01 DIAGNOSIS — I2510 Atherosclerotic heart disease of native coronary artery without angina pectoris: Secondary | ICD-10-CM | POA: Diagnosis not present

## 2017-05-01 DIAGNOSIS — I2583 Coronary atherosclerosis due to lipid rich plaque: Secondary | ICD-10-CM

## 2017-05-01 DIAGNOSIS — E1165 Type 2 diabetes mellitus with hyperglycemia: Secondary | ICD-10-CM | POA: Diagnosis not present

## 2017-05-01 MED ORDER — PIOGLITAZONE HCL 15 MG PO TABS: 15.0000 mg | ORAL_TABLET | Freq: Every day | ORAL | 0 refills | Status: DC

## 2017-05-01 NOTE — Progress Notes (Signed)
Patient ID: Alan Lopez, male   DOB: 1946/07/13, 71 y.o.   MRN: [id number]           Reason for Appointment: Follow-up for Type 2 Diabetes   History of Present Illness:          Date of diagnosis of type 2 diabetes mellitus :  1999      Past history:  He had modestly increase blood sugars at diagnosis and was started on metformin Subsequently glipizide was added to improve his control. He thinks that his blood sugars have been mostly poorly controlled over the last several years He had been on glipizide and metformin for years without any adjustment of his dosage of glipizide  Review of his A1c shows that it had ranged from 9-9.5 since 07/2013 Previously his PCP tried him on a sample of Jardiance 10 mg without significant improvement  Recent history:   Oral hypoglycemic drugs the patient is taking are: metformin ER 500 mg, 2 tablets twice a day   INJECTABLE drugs: Toujeo 14 units, Humalog 6 units ac  breakfast and 8 units supper, Victoza 1.2 mg at bedtime  Because of significant hyperglycemia in 6/17 he was started on mealtime insulin with Humalog at dinner  His A1c is now up to 8.5, previously 8.4  Current blood sugar patterns and problems identified:  He was told to increase his Toujeo up to 16 units because of high fasting readings but he is still taking 14 units  However his fasting readings are averaging 161 compared to 163 previously on his monitor download  He has done postprandial readings only after supper with the postprandial readings variable and occasionally in the 1 80-200 range but overall not consistently high, averaging 150-160, previously 194  However he has not checked any readings after breakfast or lunch particularly because he is involved with medical care of his wife and other issues  He has however lost weight about 6 pounds because of increased physical activity with yard work  No hypoglycemia during the day  He does fairly regularly a sandwich  at lunch without any insulin coverage  More recently has been trying not eating out as much and is trying to cut back his evening meal portions  Side effects from medications have been: Mild  diarrhea from regular metformin  Compliance with the medical regimen: fair  Hypoglycemia: never   Glucose monitoring:  done once a day usually or less        Glucometer:   Contour   Blood Glucose readings by monitor download as above    Self-care: The diet that the patient has been following is: tries to limit high carbohydrate foods. eating out about 4 times a week     Typical meal intake: Breakfast is Recently cereal/meat .  Sometimes will have fried food in the evening Usually avoiding drinks with sugar                Dietician visit, most recent:never CDE visit: 01/2016               Exercise:  yard work, walking   Weight history: Maximum weight 215 previously  Wt Readings from Last 3 Encounters:  05/01/17 191 lb 12.8 oz (87 kg)  03/20/17 197 lb (89.4 kg)  01/29/17 197 lb (89.4 kg)    Glycemic control: last A1c 9.2 done on 02/13/15 Microalbumin ratio normal in 3/16 from PCP    Lab Results  Component Value Date   HGBA1C 8.5 (H) 04/23/2017  HGBA1C 8.4 (H) 03/17/2017   HGBA1C 7.8 (H) 12/16/2016   Lab Results  Component Value Date   MICROALBUR <0.7 01/29/2017   LDLCALC 24 01/29/2017   CREATININE 1.00 04/23/2017    No visits with results within 1 Week(s) from this visit.  Latest known visit with results is:  Lab on 04/23/2017  Component Date Value Ref Range Status  . Hgb A1c MFr Bld 04/23/2017 8.5* 4.6 - 6.5 % Final   Glycemic Control Guidelines for People with Diabetes:Non Diabetic:  <6%Goal of Therapy: <7%Additional Action Suggested:  >8%   . Sodium 04/23/2017 136  135 - 145 mEq/L Final  . Potassium 04/23/2017 4.3  3.5 - 5.1 mEq/L Final  . Chloride 04/23/2017 105  96 - 112 mEq/L Final  . CO2 04/23/2017 24  19 - 32 mEq/L Final  . Glucose, Bld 04/23/2017 165* 70 - 99 mg/dL  Final  . BUN 04/23/2017 26* 6 - 23 mg/dL Final  . Creatinine, Ser 04/23/2017 1.00  0.40 - 1.50 mg/dL Final  . Calcium 04/23/2017 9.9  8.4 - 10.5 mg/dL Final  . GFR 04/23/2017 78.02  >60.00 mL/min Final      Allergies as of 05/01/2017      Reactions   Codeine Other (See Comments)   Patient doesn't like how it makes him feel, "spaced out, not together"      Medication List       Accurate as of 05/01/17 11:59 PM. Always use your most recent med list.          aspirin EC 81 MG tablet Take 81 mg by mouth daily.   atorvastatin 80 MG tablet Commonly known as:  LIPITOR Take 40 mg by mouth daily.   BAYER MICROLET LANCETS lancets Use as instructed to check blood sugar 2 times per day dx code E11.65   cholecalciferol 1000 units tablet Commonly known as:  VITAMIN D Take 1,000 Units by mouth daily.   fexofenadine 180 MG tablet Commonly known as:  ALLEGRA Take 180 mg by mouth daily as needed for allergies (for seasonal allergies). Reported on 01/29/2016   finasteride 5 MG tablet Commonly known as:  PROSCAR Take 5 mg by mouth daily.   fluticasone 50 MCG/ACT nasal spray Commonly known as:  FLONASE Place 2 sprays into both nostrils daily as needed for allergies (for seasonal allergies). Reported on 01/29/2016   glucose blood test strip Commonly known as:  BAYER CONTOUR NEXT TEST Use as instructed to check blood sugar 2 times per day dx code E11.65   Insulin Glargine 300 UNIT/ML Sopn Commonly known as:  TOUJEO SOLOSTAR Inject 16 Units into the skin at bedtime.   insulin lispro 100 UNIT/ML KiwkPen Commonly known as:  HUMALOG Inject 6 units before each meal.   Insulin Pen Needle 32G X 4 MM Misc Use Three per day to inject insulin   liraglutide 18 MG/3ML Sopn Commonly known as:  VICTOZA INJECT 1.2 MG UNDER THE SKIN DAILY   losartan 25 MG tablet Commonly known as:  COZAAR TAKE 1/2 TABLET(12.5 MG) BY MOUTH DAILY   metFORMIN 500 MG 24 hr tablet Commonly known as:   GLUCOPHAGE-XR TAKE 2 TABLETS BY MOUTH IN THE MORNING AND 1 TABLETS BY MOUTH IN THE EVENING   metoprolol tartrate 25 MG tablet Commonly known as:  LOPRESSOR TAKE 1 TABLET BY MOUTH TWICE DAILY   pioglitazone 15 MG tablet Commonly known as:  ACTOS Take 1 tablet (15 mg total) by mouth daily.       Allergies:  Allergies  Allergen Reactions  . Codeine Other (See Comments)    Patient doesn't like how it makes him feel, "spaced out, not together"    Past Medical History:  Diagnosis Date  . 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 HTN (hypertension)   . Hyperlipidemia   . Hypertrophy (benign) of prostate   . Multinodular goiter 04/12/2015  . Non-insulin dependent type 2 diabetes mellitus ([place])   . Status post non-ST elevation myocardial infarction (NSTEMI)    CATH  . Tobacco abuse   . Type II diabetes mellitus, uncontrolled ([place]) 04/12/2015    Past Surgical History:  Procedure Laterality Date  . CORONARY ANGIOPLASTY WITH STENT PLACEMENT      Family History  Problem Relation Age of Onset  . Diabetes Mother   . Diabetes Brother   . Cancer Brother   . Heart disease Neg Hx     Social History:  reports that he has been smoking.  He has never used smokeless tobacco. He reports that he does not drink alcohol or use drugs.    Review of Systems     Lipid history:  Followed by cardiologist, since his MI he has been on 80 mg LipitorWith relatively low LDL levels    Lab Results  Component Value Date   CHOL 84 01/29/2017   HDL 45.40 01/29/2017   LDLCALC 24 01/29/2017   TRIG 70.0 01/29/2017   CHOLHDL 2 01/29/2017           Eyes: He has had blindness in his left eye from old injury    THYROID: He was found to have a 2 cm nodule on his left thyroid on his exam but ultrasound showed multinodular goiter with mostly small nodules and the largest nodule on the left side of 1.4 cm has the appearance of a pseudo- nodule Previous TSH normal in 2015   LABS:  No visits with results  within 1 Week(s) from this visit.  Latest known visit with results is:  Lab on 04/23/2017  Component Date Value Ref Range Status  . Hgb A1c MFr Bld 04/23/2017 8.5* 4.6 - 6.5 % Final   Glycemic Control Guidelines for People with Diabetes:Non Diabetic:  <6%Goal of Therapy: <7%Additional Action Suggested:  >8%   . Sodium 04/23/2017 136  135 - 145 mEq/L Final  . Potassium 04/23/2017 4.3  3.5 - 5.1 mEq/L Final  . Chloride 04/23/2017 105  96 - 112 mEq/L Final  . CO2 04/23/2017 24  19 - 32 mEq/L Final  . Glucose, Bld 04/23/2017 165* 70 - 99 mg/dL Final  . BUN 04/23/2017 26* 6 - 23 mg/dL Final  . Creatinine, Ser 04/23/2017 1.00  0.40 - 1.50 mg/dL Final  . Calcium 04/23/2017 9.9  8.4 - 10.5 mg/dL Final  . GFR 04/23/2017 78.02  >60.00 mL/min Final    Physical Examination:  BP 120/82   Pulse 74   Ht 5\' 11"  (1.803 m)   Wt 191 lb 12.8 oz (87 kg)   SpO2 93%   BMI 26.75 kg/m      ASSESSMENT:  Diabetes type 2, uncontrolled  See history of present illness for detailed discussion of his current management, blood sugar patterns and problems identified  His A1c is Consistently high over 8% and now 8.5 Appears that most of his high readings are after meals but again he is checking mostly after his evening meal and not after breakfast or lunch Most likely has high readings after lunch significant carbohydrate intake Also not clear if  he is getting adequate insulin to cover his breakfast He is however reluctant to continue Victoza when he gets in the doughnut hole which will be soon and Xultophy may not be an option because of getting an adequate dose of the Victoza with his current insulin regimen  Discussed in detail the need for control of blood sugar on the clock He is not checking blood sugars enough to qualify for the freestyle [place] system as yet although discussed the benefits of using this on a personal basis  PLAN:   He will increase his Toujeo to 16 units and discussed titrating this  based on fasting blood sugars every 5 days to keep sugar below 130 at least  He needs to have a professional continuous glucose monitoring record done along with the food diary to help adjust his insulin doses of various meals and discussed in detail how this would be done as well as benefits of analyzing CGM  Most likely he needs to start at least 6 units of Humalog for his lunch when eating bread and try to  check some readings after eating in the afternoon  Discussed need to adjust the mealtime insulin doses based on what his blood sugars do about 2 hours later and to keep them at least under 180  He can probably leave off his Victoza for some time to see if this makes any difference especially since he cannot afford this now  Continue metformin 2000 mg doses  Also discussed possibility of using a V-go system but he is currently not taking enough basal for this  He will also try to keep more consistent carbohydrate intake at breakfast and supper   Patient Instructions  Toujeo 16 units and if am still > 130 then do 18   At bfst 8 units Humalog  At lunch take 6 units for sandwich Humalog and sugar should be in range below  Check blood sugars on waking up    Also check blood sugars about 2 hours after a meal and do this after different meals by rotation  Recommended blood sugar levels on waking up is 90-130 and about 2 hours after meal is 130-160  Please bring your blood sugar monitor to each visit, thank you     Counseling time on subjects discussed above is over 50% of today's 25 minute visit     Delshon Blanchfield 05/04/2017, 2:41 PM   Note: This office note was prepared with Dragon voice recognition system technology. Any transcriptional errors that result from this process are unintentional.

## 2017-05-01 NOTE — Patient Instructions (Signed)
Toujeo 16 units and if am still > 130 then do 18   At bfst 8 units Humalog  At lunch take 6 units for sandwich Humalog and sugar should be in range below  Check blood sugars on waking up    Also check blood sugars about 2 hours after a meal and do this after different meals by rotation  Recommended blood sugar levels on waking up is 90-130 and about 2 hours after meal is 130-160  Please bring your blood sugar monitor to each visit, thank you

## 2017-05-06 ENCOUNTER — Encounter: Payer: Medicare Other | Attending: Endocrinology | Admitting: Nutrition

## 2017-05-06 NOTE — Progress Notes (Signed)
Freestyle [facility] CGM inserted in Left upper outer arm.  He was given a sheet to record meals, and insulin doses, and shown how to fill this out.  He had no final questions.  He will be going to the beach next week.  I put some Skin Tac on the site before inserting the CGM, and a IV 300 dressing over it.  He was told to come back in 2 weeks to remove this.  He agreed to do this.

## 2017-05-06 NOTE — Patient Instructions (Signed)
This is waterproof.   Do not remove the sensor.   If it falls off, keep it and return it to the office.

## 2017-05-19 ENCOUNTER — Telehealth: Payer: Self-pay | Admitting: Endocrinology

## 2017-05-19 ENCOUNTER — Other Ambulatory Visit: Payer: Self-pay

## 2017-05-19 ENCOUNTER — Encounter: Payer: Medicare Other | Attending: Endocrinology | Admitting: Nutrition

## 2017-05-19 MED ORDER — GLUCOSE BLOOD VI STRP: ORAL_STRIP | 3 refills | Status: DC

## 2017-05-19 MED ORDER — FREESTYLE LIBRE SENSOR SYSTEM MISC: 3 refills | Status: DC

## 2017-05-19 MED ORDER — FREESTYLE LANCETS MISC: 12 refills | Status: DC

## 2017-05-19 NOTE — Telephone Encounter (Signed)
[facility] called to clarify on rx for lancets and test strips. The two prescriptions do not match. One is for the [place] and one is for the freestyle. Please clarify to [facility]., verified phone #

## 2017-05-19 NOTE — Telephone Encounter (Signed)
This has been resolved- lancets ordered to match the neo test strips

## 2017-05-26 ENCOUNTER — Telehealth: Payer: Self-pay | Admitting: Endocrinology

## 2017-05-26 ENCOUNTER — Other Ambulatory Visit: Payer: Self-pay

## 2017-05-26 MED ORDER — FREESTYLE LIBRE SENSOR SYSTEM MISC: 3 refills | Status: DC

## 2017-05-26 NOTE — Telephone Encounter (Signed)
Pt needs his sensors for the [place] called into the [facility] on [address] today.

## 2017-05-26 NOTE — Telephone Encounter (Signed)
Called patient and let him know that I sent in a refill for his sensors to [facility].

## 2017-06-01 NOTE — Progress Notes (Signed)
Sensor was removed from upper arm.  Site showed no signes of redness or swelling.    Sensor was downloaded and put on Dr. Ronnie Derby desk for review.  Notes taken on meal timing and attached to download.  He had no final questions

## 2017-06-01 NOTE — Telephone Encounter (Signed)
Need to know if he is using the FreeStyle [place] sensor at home, have not still gone over the results of his professional Libre study pending 05/19/17.  Need to see him sooner than end of July for review of his current management and insulin adjustment without labs

## 2017-06-01 NOTE — Patient Instructions (Signed)
No instructions at this time

## 2017-06-02 NOTE — Telephone Encounter (Signed)
Called patient and left a voice message to please call back to let us know if he is currently using the Freestyle Libre Glucose monitoring system at home. Need to schedule for a follow up visit sooner than end of July if he is so we can adjust his medications.

## 2017-06-03 ENCOUNTER — Other Ambulatory Visit: Payer: Self-pay

## 2017-06-03 ENCOUNTER — Telehealth: Payer: Self-pay | Admitting: Endocrinology

## 2017-06-03 MED ORDER — GLUCOSE BLOOD VI STRP: ORAL_STRIP | 3 refills | Status: DC

## 2017-06-03 NOTE — Telephone Encounter (Signed)
Patient attempting to return phone call. Asking for return phone call when possible.

## 2017-06-03 NOTE — Telephone Encounter (Signed)
Patient stated that he is not using the [facility] due to cost. Please schedule for an earlier follow up visit than the end of July per Dr. Dwyane Dee. Please see messages below. Thank you!

## 2017-06-03 NOTE — Telephone Encounter (Signed)
Called patient and let him know that I did not call him today but he did stated that he will not be using the [facility] because it was too expensive and he asked me to send in a script for the [employer] Next test strips to [facility] for him.

## 2017-06-17 ENCOUNTER — Telehealth: Payer: Self-pay | Admitting: Endocrinology

## 2017-06-17 ENCOUNTER — Other Ambulatory Visit: Payer: Self-pay

## 2017-06-17 MED ORDER — GLUCOSE BLOOD VI STRP: ORAL_STRIP | 3 refills | Status: DC

## 2017-06-17 NOTE — Telephone Encounter (Signed)
Patient is advising that [facility] still doesn't have glucose blood (CONTOUR NEXT TEST) test strip [[id number]] . Please call [facility] to verbally give information.

## 2017-06-17 NOTE — Telephone Encounter (Signed)
Called patient and let him know that I had called the pharmacy and they told me that they had the prescription but needed to have a form filled out for Medicare to continue the 90 day prescription. They should be receiving, filling the form out and sending to [place] to have Dr. Dwyane Dee sign. I informed patient of this process.

## 2017-06-18 ENCOUNTER — Other Ambulatory Visit: Payer: Self-pay | Admitting: Cardiology

## 2017-06-18 ENCOUNTER — Other Ambulatory Visit: Payer: Self-pay | Admitting: Endocrinology

## 2017-06-20 ENCOUNTER — Other Ambulatory Visit: Payer: Self-pay | Admitting: Cardiology

## 2017-06-26 ENCOUNTER — Other Ambulatory Visit: Payer: Medicare Other

## 2017-06-30 ENCOUNTER — Other Ambulatory Visit (INDEPENDENT_AMBULATORY_CARE_PROVIDER_SITE_OTHER): Payer: Medicare Other

## 2017-06-30 DIAGNOSIS — Z794 Long term (current) use of insulin: Secondary | ICD-10-CM | POA: Diagnosis not present

## 2017-06-30 DIAGNOSIS — E1165 Type 2 diabetes mellitus with hyperglycemia: Secondary | ICD-10-CM | POA: Diagnosis not present

## 2017-06-30 LAB — BASIC METABOLIC PANEL
BUN: 14 mg/dL (ref 6–23)
CHLORIDE: 106 meq/L (ref 96–112)
CO2: 23 mEq/L (ref 19–32)
Calcium: 9 mg/dL (ref 8.4–10.5)
Creatinine, Ser: 0.8 mg/dL (ref 0.40–1.50)
GFR: 100.88 mL/min (ref >60.00)
Glucose, Bld: 235 mg/dL — ABNORMAL HIGH (ref 70–99)
POTASSIUM: 4 meq/L (ref 3.5–5.1)
SODIUM: 138 meq/L (ref 135–145)

## 2017-07-01 ENCOUNTER — Ambulatory Visit (INDEPENDENT_AMBULATORY_CARE_PROVIDER_SITE_OTHER): Payer: Medicare Other | Admitting: Endocrinology

## 2017-07-01 ENCOUNTER — Encounter: Payer: Self-pay | Admitting: Endocrinology

## 2017-07-01 VITALS — BP 122/78 | HR 57 | Ht 71.0 in | Wt 195.6 lb

## 2017-07-01 DIAGNOSIS — I2510 Atherosclerotic heart disease of native coronary artery without angina pectoris: Secondary | ICD-10-CM

## 2017-07-01 DIAGNOSIS — I2583 Coronary atherosclerosis due to lipid rich plaque: Secondary | ICD-10-CM | POA: Diagnosis not present

## 2017-07-01 DIAGNOSIS — Z794 Long term (current) use of insulin: Secondary | ICD-10-CM

## 2017-07-01 DIAGNOSIS — E1165 Type 2 diabetes mellitus with hyperglycemia: Secondary | ICD-10-CM

## 2017-07-01 LAB — FRUCTOSAMINE: Fructosamine: 294 umol/L — ABNORMAL HIGH (ref 0–285)

## 2017-07-01 NOTE — Progress Notes (Signed)
Patient ID: Alan Lopez, male   DOB: 1946-08-01, 71 y.o.   MRN: [id number]           Reason for Appointment: Follow-up for Type 2 Diabetes   History of Present Illness:          Date of diagnosis of type 2 diabetes mellitus :  1999      Past history:  He had modestly increase blood sugars at diagnosis and was started on metformin Subsequently glipizide was added to improve his control. He thinks that his blood sugars have been mostly poorly controlled over the last several years He had been on glipizide and metformin for years without any adjustment of his dosage of glipizide  Review of his A1c shows that it had ranged from 9-9.5 since 07/2013 Previously his PCP tried him on a sample of Jardiance 10 mg without significant improvement  Recent history:   Oral hypoglycemic drugs the patient is taking are: metformin ER 500 mg, 2 tablets twice a day   INJECTABLE drugs: Toujeo 16 units pm, Humalog 6 units ac  Breakfast/ lunch and 8 units supper  Because of significant hyperglycemia in 6/17 he was started on mealtime insulin with Humalog at dinner  His A1c is most recently up to 8.5, previously 8.4  Current blood sugar patterns and problems identified:  He was told to increase his Toujeo progressively to get his morning sugars down but he has not done so and blood sugars are much higher in the morning now  This is most likely because of stopping Victoza which he cannot afford  He was not taking any mealtime insulin at lunchtime and because of his age and results he was advised to start taking some  However he has check blood sugars mostly in the mornings and only a couple of readings recently after supper which are modestly increased  He says that he is having of busy schedule with care of his wife who is a cancer patient and has difficulty planning his meals, tending to eat out more also  Also not always able to take insulin when eating out  He has not been consistently  active  He thinks he is feeling more tired on some days  Has gained a little weight with stopping Victoza  Side effects from medications have been: Mild  diarrhea from regular metformin  Compliance with the medical regimen: fair  Hypoglycemia: never   Glucose monitoring:  done once a day usually or less        Glucometer:   Contour   Blood Glucose readings by monitor download   FASTING blood sugars range 1 51-267 with AVERAGE 203 Evening blood sugar range 163-205 with only 4 readings   CONTINUOUS GLUCOSE MONITORING RECORD INTERPRETATION    Dates of Recording: 5/29 through 05/19/17  Sensor  summary:  Average blood glucose for the period of recording is 136 with only 46% in target of 80-120.  The standard deviation is 71 Only 1% of blood sugars are below 70       Glycemic patterns:  Hyperglycemic episodesOccurred most significantly after lunch his blood sugars peaking between 2-4 PM at 282 with some variability   Hypoglycemic episodes are minimal with only rare low blood sugar around 65-70 at bedtime Blood sugars are overall fairly good through the night but rising after breakfast to about 200 and then again much higher after lunch but declining progressively between 4 PM-11 PM      Overnight periods:  Blood sugars are  averaging about 140 around midnight, fairly level until about 4 AM and then gradually rising without any hypoglycemia.  Blood sugar was relatively low after 11 PM only once on 5/30      Preprandial periods:  Blood sugars are mildly increased in the morning before breakfast around 160-170 Prelunch blood sugars are generally over 200 on average with significant variability Blood sugars are more consistently over 200 around suppertime      Postprandial periods:  Blood sugars are rising modestly after breakfast 20 variability stent and significantly on some days Blood sugars are mostly rising significantly after lunch and averaging 282 at the highest Postprandial  readings after dinner are variable and averaging about 180      Hypoglycemia: Minimal on one evening around 11 PM as above         Self-care: The diet that the patient has been following is: tries to limit high carbohydrate foods. eating out about 4 times a week     Typical meal intake: Breakfast is Recently cereal/meat .  Sometimes will have fried food in the evening Usually avoiding drinks with sugar                Dietician visit, most recent:never CDE visit: 01/2016               Exercise:  yard work, walking   Weight history: Maximum weight 215 previously  Wt Readings from Last 3 Encounters:  07/01/17 195 lb 9.6 oz (88.7 kg)  05/01/17 191 lb 12.8 oz (87 kg)  03/20/17 197 lb (89.4 kg)    Glycemic control: last A1c 9.2 done on 02/13/15 Microalbumin ratio normal in 3/16 from PCP    Lab Results  Component Value Date   HGBA1C 8.5 (H) 04/23/2017   HGBA1C 8.4 (H) 03/17/2017   HGBA1C 7.8 (H) 12/16/2016   Lab Results  Component Value Date   MICROALBUR <0.7 01/29/2017   LDLCALC 24 01/29/2017   CREATININE 0.80 06/30/2017    Lab on 06/30/2017  Component Date Value Ref Range Status  . Fructosamine 06/30/2017 294* 0 - 285 umol/L Final   Comment: Published reference interval for apparently healthy subjects between age 77 and 62 is 59 - 285 umol/L and in a poorly controlled diabetic population is 228 - 563 umol/L with a mean of 396 umol/L.   Marland Kitchen Sodium 06/30/2017 138  135 - 145 mEq/L Final  . Potassium 06/30/2017 4.0  3.5 - 5.1 mEq/L Final  . Chloride 06/30/2017 106  96 - 112 mEq/L Final  . CO2 06/30/2017 23  19 - 32 mEq/L Final  . Glucose, Bld 06/30/2017 235* 70 - 99 mg/dL Final  . BUN 06/30/2017 14  6 - 23 mg/dL Final  . Creatinine, Ser 06/30/2017 0.80  0.40 - 1.50 mg/dL Final  . Calcium 06/30/2017 9.0  8.4 - 10.5 mg/dL Final  . GFR 06/30/2017 100.88  >60.00 mL/min Final      Allergies as of 07/01/2017      Reactions   Codeine Other (See Comments)   Patient doesn't  like how it makes him feel, "spaced out, not together"      Medication List       Accurate as of 07/01/17  3:39 PM. Always use your most recent med list.          aspirin EC 81 MG tablet Take 81 mg by mouth daily.   atorvastatin 80 MG tablet Commonly known as:  LIPITOR Take 1 tablet (80 mg total) by mouth  daily. Please keep 09/09/17 appt for future refills   cholecalciferol 1000 units tablet Commonly known as:  VITAMIN D Take 1,000 Units by mouth daily.   fexofenadine 180 MG tablet Commonly known as:  ALLEGRA Take 180 mg by mouth daily as needed for allergies (for seasonal allergies). Reported on 01/29/2016   finasteride 5 MG tablet Commonly known as:  PROSCAR Take 5 mg by mouth daily.   fluticasone 50 MCG/ACT nasal spray Commonly known as:  FLONASE Place 2 sprays into both nostrils daily as needed for allergies (for seasonal allergies). Reported on 01/29/2016   freestyle lancets Use to test blood sugar once daily Dx code E11.8   FREESTYLE LIBRE SENSOR SYSTEM Misc Test one time daily   glucose blood test strip Commonly known as:  CONTOUR NEXT TEST Use to test blood sugars 2-3 times daily.   Insulin Glargine 300 UNIT/ML Sopn Commonly known as:  TOUJEO SOLOSTAR Inject 16 Units into the skin at bedtime.   insulin lispro 100 UNIT/ML KiwkPen Commonly known as:  HUMALOG Inject 6 units before each meal.   Insulin Pen Needle 32G X 4 MM Misc Use Three per day to inject insulin   liraglutide 18 MG/3ML Sopn Commonly known as:  VICTOZA INJECT 1.2 MG UNDER THE SKIN DAILY   losartan 25 MG tablet Commonly known as:  COZAAR TAKE 1/2 TABLET(12.5 MG) BY MOUTH DAILY   metFORMIN 500 MG 24 hr tablet Commonly known as:  GLUCOPHAGE-XR TAKE 2 TABLETS BY MOUTH IN THE MORNING AND 1 TABLET IN THE EVENING   metoprolol tartrate 25 MG tablet Commonly known as:  LOPRESSOR TAKE 1 TABLET BY MOUTH TWICE DAILY   pioglitazone 15 MG tablet Commonly known as:  ACTOS Take 1 tablet (15  mg total) by mouth daily.       Allergies:  Allergies  Allergen Reactions  . Codeine Other (See Comments)    Patient doesn't like how it makes him feel, "spaced out, not together"    Past Medical History:  Diagnosis Date  . 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 HTN (hypertension)   . Hyperlipidemia   . Hypertrophy (benign) of prostate   . Multinodular goiter 04/12/2015  . Non-insulin dependent type 2 diabetes mellitus ([place])   . Status post non-ST elevation myocardial infarction (NSTEMI)    CATH  . Tobacco abuse   . Type II diabetes mellitus, uncontrolled ([place]) 04/12/2015    Past Surgical History:  Procedure Laterality Date  . CORONARY ANGIOPLASTY WITH STENT PLACEMENT      Family History  Problem Relation Age of Onset  . Diabetes Mother   . Diabetes Brother   . Cancer Brother   . Heart disease Neg Hx     Social History:  reports that he has been smoking.  He has never used smokeless tobacco. He reports that he does not drink alcohol or use drugs.    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 Fructosamine 06/30/2017 294* 0 - 285 umol/L Final   Comment: Published reference interval for apparently healthy subjects between age 69 and 63 is 72 - 285 umol/L and in a poorly controlled diabetic population is 228 - 563 umol/L  with a mean of 396 umol/L.   Marland Kitchen Sodium 06/30/2017 138  135 - 145 mEq/L Final  . Potassium 06/30/2017 4.0  3.5 - 5.1 mEq/L Final  . Chloride 06/30/2017 106  96 - 112 mEq/L Final  . CO2 06/30/2017 23  19 - 32 mEq/L Final  . Glucose, Bld 06/30/2017 235* 70 - 99 mg/dL Final  . BUN 06/30/2017 14  6 - 23 mg/dL Final  . Creatinine, Ser 06/30/2017 0.80  0.40 - 1.50 mg/dL Final  . Calcium 06/30/2017 9.0  8.4 - 10.5 mg/dL Final  . GFR 06/30/2017 100.88  >60.00 mL/min Final    Physical Examination:  BP 122/78   Pulse (!) 57   Ht 5\' 11"  (1.803 m)   Wt 195 lb 9.6 oz (88.7 kg)   SpO2 97%   BMI 27.28 kg/m      ASSESSMENT:  Diabetes type 2, uncontrolled  See history of present illness for detailed discussion of his current management, blood sugar patterns and problems identified  His blood sugars are worse with stopping Victoza He again cannot afford this Discussed need for better control both overnight and after meals Also need better pattern of glucose monitoring He has practical difficulties following his regimen or his diet because of having to constantly care for his wife Also has difficulty affording Victoza and other brand name product  Discussed in detail the option of using the V-go pump since now he is nearly needing more than 20 units basal insulin without the Victoza Informant may not be helping much   PLAN:   He will Start the V-go pump after instructions from nurse educator  He will review the information provided on this and also called to check coverage for this  He will not take the Toujeo the night before the start of the pump and no Humalog in the morning  Discussed adjusting the Toujeo based on fasting blood sugars and for now he will increase the doses by at least 6 units, I started should be at least under 140  He will increase her Humalog at lunch to at least 8 units  He needs to start checking blood sugars more consistently later in the day instead of just  in the morning  Low-fat diet  V-go pump SETTINGS: Basal rate = 20 units BOLUSES = 6 units at breakfast, 8 units at lunch and 8 units at dinner to start with   Patient Instructions  Toujeo 22 units and 8 Humalog at lunch  Keep am sugar <140 at least  Check blood sugars on waking up  3/7  Also check blood sugars about 2 hours after a meal and do this after different meals by rotation  Recommended blood sugar levels on waking up is 90-130 and about 2 hours after meal is 130-160  Please bring your blood sugar monitor to each visit, thank you   Counseling time on subjects discussed above is over 50% of today's 25 minute visit  Zaelyn Barbary 07/01/2017, 3:39 PM   Note: This office note was prepared with Dragon voice recognition system technology. Any transcriptional errors that result from this process are unintentional.

## 2017-07-01 NOTE — Patient Instructions (Addendum)
Toujeo 22 units and 8 Humalog at lunch  Keep am sugar <140 at least  Check blood sugars on waking up  3/7  Also check blood sugars about 2 hours after a meal and do this after different meals by rotation  Recommended blood sugar levels on waking up is 90-130 and about 2 hours after meal is 130-160  Please bring your blood sugar monitor to each visit, thank you

## 2017-07-02 ENCOUNTER — Encounter: Payer: Medicare Other | Attending: Endocrinology | Admitting: Nutrition

## 2017-07-02 DIAGNOSIS — Z713 Dietary counseling and surveillance: Secondary | ICD-10-CM | POA: Diagnosis not present

## 2017-07-02 DIAGNOSIS — E1165 Type 2 diabetes mellitus with hyperglycemia: Secondary | ICD-10-CM | POA: Diagnosis not present

## 2017-07-02 DIAGNOSIS — Z794 Long term (current) use of insulin: Secondary | ICD-10-CM | POA: Diagnosis not present

## 2017-07-02 DIAGNOSIS — E118 Type 2 diabetes mellitus with unspecified complications: Secondary | ICD-10-CM

## 2017-07-04 NOTE — Progress Notes (Signed)
Mr. Alan Lopez was trained on how to fill, apply and use the V-Go.  He was given a starter kit of V-go 20s with directions for use, and an 800 telephone number to call if questions.   He did not take his Toujeo last night, so he filled the V-Go 20  with Humalog insulin, and applied it to his left upper abdomen with no assistance from me.  Written instructions were given for 3 button presses before breakfast, and 4 button presses before lunch and supper.  He re demonstrated how to give the button presses on a demo v-go and reverbalized the correct dossage, and the fact that each button press delivers 2u of Humalog..  Written instructions were also given to stop the Toujeo insulin.  A record sheet was given to record his blood sugars, and he was instructed to test his sugars before meals and at bedtime.   He was told to bring this sheet with him at his visit with Dr. Dwyane Dee in 10 days.  He had no final questions.

## 2017-07-04 NOTE — Patient Instructions (Addendum)
Stop Toujeo insulin Fill and apply a new V-Go every 24 hours. Give 3 button presses before breakfast, and 4 button presses before lunch and supper Call if blood sugars remain over 200, or drop below 70

## 2017-07-07 ENCOUNTER — Other Ambulatory Visit: Payer: Self-pay

## 2017-07-07 MED ORDER — INSULIN LISPRO 100 UNIT/ML ~~LOC~~ SOLN: SUBCUTANEOUS | 4 refills | Status: DC

## 2017-07-09 ENCOUNTER — Other Ambulatory Visit: Payer: Self-pay

## 2017-07-09 ENCOUNTER — Encounter: Payer: Self-pay | Admitting: Endocrinology

## 2017-07-09 MED ORDER — INSULIN LISPRO 100 UNIT/ML ~~LOC~~ SOLN: SUBCUTANEOUS | 4 refills | Status: DC

## 2017-07-17 ENCOUNTER — Ambulatory Visit (INDEPENDENT_AMBULATORY_CARE_PROVIDER_SITE_OTHER): Payer: Medicare Other | Admitting: Endocrinology

## 2017-07-17 ENCOUNTER — Other Ambulatory Visit: Payer: Self-pay | Admitting: Cardiology

## 2017-07-17 ENCOUNTER — Encounter: Payer: Self-pay | Admitting: Endocrinology

## 2017-07-17 ENCOUNTER — Other Ambulatory Visit: Payer: Self-pay

## 2017-07-17 VITALS — BP 122/78 | HR 55 | Ht 71.0 in | Wt 193.4 lb

## 2017-07-17 DIAGNOSIS — Z794 Long term (current) use of insulin: Secondary | ICD-10-CM

## 2017-07-17 DIAGNOSIS — E1165 Type 2 diabetes mellitus with hyperglycemia: Secondary | ICD-10-CM

## 2017-07-17 DIAGNOSIS — I2583 Coronary atherosclerosis due to lipid rich plaque: Secondary | ICD-10-CM

## 2017-07-17 DIAGNOSIS — I2510 Atherosclerotic heart disease of native coronary artery without angina pectoris: Secondary | ICD-10-CM

## 2017-07-17 MED ORDER — LOSARTAN POTASSIUM 25 MG PO TABS: ORAL_TABLET | ORAL | 0 refills | Status: DC

## 2017-07-17 NOTE — Patient Instructions (Addendum)
3 clicks at dinner  Must check after all meals !  Try 30 basal  Try Relion R insulin [facility]

## 2017-07-17 NOTE — Progress Notes (Signed)
Patient ID: Alan Lopez, male   DOB: March 17, 1946, 71 y.o.   MRN: [id number]           Reason for Appointment: Follow-up for Type 2 Diabetes   History of Present Illness:          Date of diagnosis of type 2 diabetes mellitus :  1999      Past history:  He had modestly increase blood sugars at diagnosis and was started on metformin Subsequently glipizide was added to improve his control. He thinks that his blood sugars have been mostly poorly controlled over the last several years He had been on glipizide and metformin for years without any adjustment of his dosage of glipizide  Review of his A1c shows that it had ranged from 9-9.5 since 07/2013 Previously his PCP tried him on a sample of Jardiance 10 mg without significant improvement  Recent history:   Oral hypoglycemic drugs the patient is taking are: metformin ER 500 mg, 2 tablets twice a day   Current regimen: We have an go pump 20 unit basal him a boluses 6-8-8  PREVIOUS INSULIN: Toujeo 16 units pm, Humalog 6 units ac  Breakfast/ lunch and 8 units supper  His A1c is most recently up to 8.5, previously 8.4  Current blood sugar patterns and problems identified:  He was started on the V-go pump on 07/02/17 because of inconsistent control and poor compliance with mealtime insulin  Although he was taking 16 units of Toujeo even with taking the 20 unit basal his FASTING blood sugars are high although still relatively better.  Only once had a glucose of 95 right after starting the pump  More recently blood sugars in the mornings are mostly in the 150-170 range  This is despite his blood sugars at night after supper improving  He felt hypoglycemic when bedtime reading was 74 on Monday  Recently has been cutting back on carbohydrates at dinnertime and not always having an alcoholic drink in the evening  Despite leaving off Victoza his weight has now leveled off, he does not want to take this because of high cost  factor  However he is also concerned about the cost of the V-go pump in the dough[place]  Side effects from medications have been: Mild  diarrhea from regular metformin  Compliance with the medical regimen: fair  Hypoglycemia: never   Glucose monitoring:  done once a day usually or less        Glucometer:   Contour   Mean values apply above for all meters except median for One Touch  PRE-MEAL Fasting Lunch Dinner Bedtime Overall  Glucose range: 95-238  214   70-230    Mean/median: 175    159  171        Self-care: The diet that the patient has been following is: tries to limit high carbohydrate foods. eating out about 4 times a week     Typical meal intake: Breakfast is Recently cereal/meat .  Sometimes will have fried food in the evening Usually avoiding drinks with sugar                Dietician visit, most recent:never CDE visit: 01/2016               Exercise:  yard work, walking   Weight history: Maximum weight 215 previously  Wt Readings from Last 3 Encounters:  07/17/17 193 lb 6.4 oz (87.7 kg)  07/01/17 195 lb 9.6 oz (88.7 kg)  05/01/17 191  lb 12.8 oz (87 kg)    Glycemic control: last A1c 9.2 done on 02/13/15 Microalbumin ratio normal in 3/16 from PCP    Lab Results  Component Value Date   HGBA1C 8.5 (H) 04/23/2017   HGBA1C 8.4 (H) 03/17/2017   HGBA1C 7.8 (H) 12/16/2016   Lab Results  Component Value Date   MICROALBUR <0.7 01/29/2017   LDLCALC 24 01/29/2017   CREATININE 0.80 06/30/2017    No visits with results within 1 Week(s) from this visit.  Latest known visit with results is:  Lab on 06/30/2017  Component Date Value Ref Range Status  . Fructosamine 06/30/2017 294* 0 - 285 umol/L Final   Comment: Published reference interval for apparently healthy subjects between age 85 and 34 is 27 - 285 umol/L and in a poorly controlled diabetic population is 228 - 563 umol/L with a mean of 396 umol/L.   Marland Kitchen Sodium 06/30/2017 138  135 - 145 mEq/L Final  .  Potassium 06/30/2017 4.0  3.5 - 5.1 mEq/L Final  . Chloride 06/30/2017 106  96 - 112 mEq/L Final  . CO2 06/30/2017 23  19 - 32 mEq/L Final  . Glucose, Bld 06/30/2017 235* 70 - 99 mg/dL Final  . BUN 06/30/2017 14  6 - 23 mg/dL Final  . Creatinine, Ser 06/30/2017 0.80  0.40 - 1.50 mg/dL Final  . Calcium 06/30/2017 9.0  8.4 - 10.5 mg/dL Final  . GFR 06/30/2017 100.88  >60.00 mL/min Final      Allergies as of 07/17/2017      Reactions   Codeine Other (See Comments)   Patient doesn't like how it makes him feel, "spaced out, not together"      Medication List       Accurate as of 07/17/17 10:52 AM. Always use your most recent med list.          aspirin EC 81 MG tablet Take 81 mg by mouth daily.   atorvastatin 80 MG tablet Commonly known as:  LIPITOR Take 1 tablet (80 mg total) by mouth daily. Please keep 09/09/17 appt for future refills   cholecalciferol 1000 units tablet Commonly known as:  VITAMIN D Take 1,000 Units by mouth daily.   fexofenadine 180 MG tablet Commonly known as:  ALLEGRA Take 180 mg by mouth daily as needed for allergies (for seasonal allergies). Reported on 01/29/2016   finasteride 5 MG tablet Commonly known as:  PROSCAR Take 5 mg by mouth daily.   fluticasone 50 MCG/ACT nasal spray Commonly known as:  FLONASE Place 2 sprays into both nostrils daily as needed for allergies (for seasonal allergies). Reported on 01/29/2016   freestyle lancets Use to test blood sugar once daily Dx code E11.8   FREESTYLE LIBRE SENSOR SYSTEM Misc Test one time daily   glucose blood test strip Commonly known as:  CONTOUR NEXT TEST Use to test blood sugars 2-3 times daily.   Insulin Glargine 300 UNIT/ML Sopn Commonly known as:  TOUJEO SOLOSTAR Inject 16 Units into the skin at bedtime.   insulin lispro 100 UNIT/ML injection Commonly known as:  HUMALOG Use 68 units daily in the V-Go pump   Insulin Pen Needle 32G X 4 MM Misc Use Three per day to inject insulin    liraglutide 18 MG/3ML Sopn Commonly known as:  VICTOZA INJECT 1.2 MG UNDER THE SKIN DAILY   losartan 25 MG tablet Commonly known as:  COZAAR Take 0.5 tablets (12.5 mg total) by mouth daily. Please keep upcoming appointment  metFORMIN 500 MG 24 hr tablet Commonly known as:  GLUCOPHAGE-XR TAKE 2 TABLETS BY MOUTH IN THE MORNING AND 1 TABLET IN THE EVENING   metoprolol tartrate 25 MG tablet Commonly known as:  LOPRESSOR TAKE 1 TABLET BY MOUTH TWICE DAILY   pioglitazone 15 MG tablet Commonly known as:  ACTOS Take 1 tablet (15 mg total) by mouth daily.       Allergies:  Allergies  Allergen Reactions  . Codeine Other (See Comments)    Patient doesn't like how it makes him feel, "spaced out, not together"    Past Medical History:  Diagnosis Date  . 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 HTN (hypertension)   . Hyperlipidemia   . Hypertrophy (benign) of prostate   . Multinodular goiter 04/12/2015  . Non-insulin dependent type 2 diabetes mellitus ([place])   . Status post non-ST elevation myocardial infarction (NSTEMI)    CATH  . Tobacco abuse   . Type II diabetes mellitus, uncontrolled ([place]) 04/12/2015    Past Surgical History:  Procedure Laterality Date  . CORONARY ANGIOPLASTY WITH STENT PLACEMENT      Family History  Problem Relation Age of Onset  . Diabetes Mother   . Diabetes Brother   . Cancer Brother   . Heart disease Neg Hx     Social History:  reports that he has been smoking.  He has never used smokeless tobacco. He reports that he does not drink alcohol or use drugs.    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  Latest known visit with results is:  Lab on 06/30/2017  Component Date Value Ref Range Status  . Fructosamine 06/30/2017 294* 0 - 285 umol/L Final   Comment: Published reference interval for apparently healthy subjects between age 62 and 64 is 69 - 285 umol/L and in a poorly controlled diabetic population is 228 - 563 umol/L with a mean of 396 umol/L.   Marland Kitchen Sodium 06/30/2017 138  135 - 145 mEq/L Final  . Potassium 06/30/2017 4.0  3.5 - 5.1 mEq/L Final  . Chloride 06/30/2017 106  96 - 112 mEq/L Final  . CO2 06/30/2017 23  19 - 32 mEq/L Final  . Glucose, Bld 06/30/2017 235* 70 - 99 mg/dL Final  . BUN 06/30/2017 14  6 - 23 mg/dL Final  . Creatinine, Ser 06/30/2017 0.80  0.40 - 1.50 mg/dL Final  . Calcium 06/30/2017 9.0  8.4 - 10.5 mg/dL Final  . GFR 06/30/2017 100.88  >60.00 mL/min Final    Physical Examination:  BP 122/78   Pulse (!) 55   Ht 5\' 11"  (1.803 m)   Wt 193 lb 6.4 oz (87.7 kg)  SpO2 96%   BMI 26.97 kg/m      ASSESSMENT:  Diabetes type 2, uncontrolled  See history of present illness for detailed discussion of his current management, blood sugar patterns and problems identified  His fructosamine is mildly increased last month at 294  Because of poor control with stopping Victoza he is now needing more insulin Even with using the V-go pump his basal rate of 20 units does not appear to be adequate He is also not checking blood sugars enough after meals except some after supper which are recently improving  Most of the discussion today was related to various options to help him with the cost of his insulin regimen as he is not keen on continuing the V-go pump  which is costing $375 He does not want to pay for Victoza again Also he is not getting his test strips billed through Medicare part B   PLAN:   He will Use the [employer] brand and Regular Insulin for his pump to compensate for the high cost of the pump  Discussed needing to bolus 15 minutes or so at least with using the regular insulin instead of Humalog  Meanwhile he will need to cut back on the boluses at dinnertime since he tends to have low normal readings recently with cutting back carbohydrates and using less frequently alcoholic drinks  He does need to check his blood sugars after breakfast and lunch to help adjust his boluses  Will need to reassess his control with A1c on the next visit  Encouraged him to be regular with exercise also  He will increase his basal rate using the 30 unit V-go and a sample was given, he will call next week to confirm that this is working better without overnight hypoglycemia  Continue metformin  V-go pump SETTINGS: Basal rate = 30 units BOLUSES = 6 units at breakfast, 8 units at lunch and 6- 8 units at dinner   Patient Instructions  3 clicks at dinner  Must check after all meals !  Try 30 basal  Try Relion R insulin [employer]   Counseling time on subjects discussed above is over 50% of today's 25 minute visit     Zaraya Delauder 07/17/2017, 10:52 AM   Note: This office note was prepared with Estate agent. Any transcriptional errors that result from this process are unintentional.

## 2017-07-23 ENCOUNTER — Other Ambulatory Visit: Payer: Self-pay

## 2017-07-23 ENCOUNTER — Telehealth: Payer: Self-pay | Admitting: Endocrinology

## 2017-07-23 ENCOUNTER — Other Ambulatory Visit: Payer: Self-pay | Admitting: Endocrinology

## 2017-07-23 MED ORDER — V-GO 30 KIT: PACK | 3 refills | Status: DC

## 2017-07-23 NOTE — Telephone Encounter (Signed)
MEDICATION: VGO manuel 30   PHARMACY:  [facility] [place], [place] AT [address] [phone] (Phone) [phone] (Fax)   IS THIS A 90 DAY SUPPLY : yes; if it is approved  IS PATIENT OUT OF MEDICTAION: yes  IF NOT; HOW MUCH IS LEFT: n/a  LAST APPOINTMENT DATE:07/17/17  NEXT APPOINTMENT DATE:08/21/17  OTHER COMMENTS: Patient does want a rx for the VGO 51.Homolog to Humalin request, if not then what is an alternative   **Let patient know to contact pharmacy at the end of the day to make sure medication is ready. **  ** Please notify patient to allow 48-72 hours to process**  **Encourage patient to contact the pharmacy for refills or they can request refills through [facility]**

## 2017-07-23 NOTE — Telephone Encounter (Signed)
Called patient and left a voice message to let him know that I have sent in the VGo 30 prescription for him and he can also purchase the [employer] brand insulin Novolin R (Relion Brand) from the shelf in [place] without a prescription and to follow dosage instructions from his aftercare visit sheet that has this on it.

## 2017-07-23 NOTE — Telephone Encounter (Signed)
He will get the Novolin R [employer] brand without prescription

## 2017-07-23 NOTE — Telephone Encounter (Signed)
I see a note in his last OV that you will switch from Humalog to the Relion Insulin. Please advise dosage so I can send this in.

## 2017-08-10 ENCOUNTER — Other Ambulatory Visit: Payer: Self-pay | Admitting: Cardiology

## 2017-08-10 ENCOUNTER — Other Ambulatory Visit: Payer: Self-pay | Admitting: Endocrinology

## 2017-08-12 ENCOUNTER — Other Ambulatory Visit: Payer: Self-pay | Admitting: Cardiology

## 2017-08-21 ENCOUNTER — Other Ambulatory Visit: Payer: Medicare Other

## 2017-08-26 ENCOUNTER — Other Ambulatory Visit (INDEPENDENT_AMBULATORY_CARE_PROVIDER_SITE_OTHER): Payer: Medicare Other

## 2017-08-26 DIAGNOSIS — Z794 Long term (current) use of insulin: Secondary | ICD-10-CM

## 2017-08-26 DIAGNOSIS — E1165 Type 2 diabetes mellitus with hyperglycemia: Secondary | ICD-10-CM

## 2017-08-26 LAB — BASIC METABOLIC PANEL
BUN: 20 mg/dL (ref 6–23)
CO2: 25 meq/L (ref 19–32)
Calcium: 9 mg/dL (ref 8.4–10.5)
Chloride: 107 mEq/L (ref 96–112)
Creatinine, Ser: 0.8 mg/dL (ref 0.40–1.50)
GFR: 100.84 mL/min (ref >60.00)
GLUCOSE: 130 mg/dL — AB (ref 70–99)
POTASSIUM: 4.1 meq/L (ref 3.5–5.1)
SODIUM: 139 meq/L (ref 135–145)

## 2017-08-26 LAB — HEMOGLOBIN A1C: Hgb A1c MFr Bld: 7.5 % — ABNORMAL HIGH (ref 4.6–6.5)

## 2017-08-27 ENCOUNTER — Ambulatory Visit (INDEPENDENT_AMBULATORY_CARE_PROVIDER_SITE_OTHER): Payer: Medicare Other | Admitting: Endocrinology

## 2017-08-27 ENCOUNTER — Encounter: Payer: Self-pay | Admitting: Endocrinology

## 2017-08-27 VITALS — BP 134/80 | HR 48 | Temp 98.2°F | Wt 199.5 lb

## 2017-08-27 DIAGNOSIS — I2583 Coronary atherosclerosis due to lipid rich plaque: Secondary | ICD-10-CM

## 2017-08-27 DIAGNOSIS — Z794 Long term (current) use of insulin: Secondary | ICD-10-CM | POA: Diagnosis not present

## 2017-08-27 DIAGNOSIS — I2510 Atherosclerotic heart disease of native coronary artery without angina pectoris: Secondary | ICD-10-CM | POA: Diagnosis not present

## 2017-08-27 DIAGNOSIS — E1165 Type 2 diabetes mellitus with hyperglycemia: Secondary | ICD-10-CM | POA: Diagnosis not present

## 2017-08-27 NOTE — Patient Instructions (Addendum)
Stop pm of Metformin  Check sugar when sugar low and DAILY 2-3 HRS AFTER SUPPER  LO CARB meals: take 3 clicks

## 2017-08-27 NOTE — Progress Notes (Signed)
Patient ID: Alan Lopez, male   DOB: [birth year], 71 y.o.   MRN: [id number]           Reason for Appointment: Follow-up for Type 2 Diabetes   History of Present Illness:          Date of diagnosis of type 2 diabetes mellitus :  1999      Past history:  He had modestly increase blood sugars at diagnosis and was started on metformin Subsequently glipizide was added to improve his control. He thinks that his blood sugars have been mostly poorly controlled over the last several years He had been on glipizide and metformin for years without any adjustment of his dosage of glipizide  Review of his A1c shows that it had ranged from 9-9.5 since 07/2013 Previously his PCP tried him on a sample of Jardiance 10 mg without significant improvement  Recent history:   Oral hypoglycemic drugs the patient is taking are: metformin ER 500 mg, 2 tablets in the morning and 1 in the evening  Current regimen:  V-go pump SETTINGS: Basal rate = 30 units BOLUSES = 6 units at breakfast, 8 units at lunch and 6- 8 units at dinner   His A1c is most recently down to 7.5, previously was up to 8.5  Current blood sugar patterns and problems identified:  He was started on the V-go pump on 07/02/17 because of inconsistent control and poor compliance with mealtime insulin  Since his fasting readings are averaging 175 on his last visit he was given the 30 unit pump instead of the 20  However he thinks his blood sugars periodically get low at about 3 AM with symptoms of a little shakiness, weakness and hunger related within 10 minutes with juice  However he does not document low sugars although he thinks he has had symptoms twice in the last 10 days or so  His blood sugar apparently was low last night even though he had to have biscuits at dinnertime  FASTING blood sugars are however mildly increased overall with lowest reading only 99  He is doing fairly well with taking his insulin boluses for his  meals  Although he was doing a few readings after meals on the last visit he has not done any after meals at all  His carbohydrate intake may be somewhat variable in the evenings especially  He has gained weight and also he thinks he is not exercising much  Side effects from medications have been: Mild  diarrhea from regular metformin  Compliance with the medical regimen: fair   Hypoglycemia: never   Glucose monitoring:  done once a day usually or less        Glucometer:   Contour   FASTING blood sugar range 99-209 with average 138    Self-care: The diet that the patient has been following is: tries to limit high carbohydrate foods. eating out about 4 times a week     Typical meal intake: Breakfast is  cereal/meat .  Sometimes will have fried food in the evening Usually avoiding drinks with sugar                Dietician visit, most recent:never CDE visit: 01/2016               Exercise:  yard work, walking some  Weight history: Maximum weight 215 previously  Wt Readings from Last 3 Encounters:  08/27/17 199 lb 8 oz (90.5 kg)  07/17/17 193 lb 6.4 oz (87.7 kg)  07/01/17 195 lb 9.6 oz (88.7 kg)    Glycemic control: last A1c 9.2 done on 02/13/15 Microalbumin ratio normal in 3/16 from PCP    Lab Results  Component Value Date   HGBA1C 7.5 (H) 08/26/2017   HGBA1C 8.5 (H) 04/23/2017   HGBA1C 8.4 (H) 03/17/2017   Lab Results  Component Value Date   MICROALBUR <0.7 01/29/2017   LDLCALC 24 01/29/2017   CREATININE 0.80 08/26/2017    Lab on 08/26/2017  Component Date Value Ref Range Status  . Hgb A1c MFr Bld 08/26/2017 7.5* 4.6 - 6.5 % Final   Glycemic Control Guidelines for People with Diabetes:Non Diabetic:  <6%Goal of Therapy: <7%Additional Action Suggested:  >8%   . Sodium 08/26/2017 139  135 - 145 mEq/L Final  . Potassium 08/26/2017 4.1  3.5 - 5.1 mEq/L Final  . Chloride 08/26/2017 107  96 - 112 mEq/L Final  . CO2 08/26/2017 25  19 - 32 mEq/L Final  . Glucose, Bld  08/26/2017 130* 70 - 99 mg/dL Final  . BUN 08/26/2017 20  6 - 23 mg/dL Final  . Creatinine, Ser 08/26/2017 0.80  0.40 - 1.50 mg/dL Final  . Calcium 08/26/2017 9.0  8.4 - 10.5 mg/dL Final  . GFR 08/26/2017 100.84  >60.00 mL/min Final      Allergies as of 08/27/2017      Reactions   Codeine Other (See Comments)   Patient doesn't like how it makes him feel, "spaced out, not together"      Medication List       Accurate as of 08/27/17  1:17 PM. Always use your most recent med list.          aspirin EC 81 MG tablet Take 81 mg by mouth daily.   atorvastatin 80 MG tablet Commonly known as:  LIPITOR Take 1 tablet (80 mg total) by mouth daily. Please keep 09/09/17 appt for future refills   cholecalciferol 1000 units tablet Commonly known as:  VITAMIN D Take 1,000 Units by mouth daily.   fexofenadine 180 MG tablet Commonly known as:  ALLEGRA Take 180 mg by mouth daily as needed for allergies (for seasonal allergies). Reported on 01/29/2016   finasteride 5 MG tablet Commonly known as:  PROSCAR Take 5 mg by mouth daily.   fluticasone 50 MCG/ACT nasal spray Commonly known as:  FLONASE Place 2 sprays into both nostrils daily as needed for allergies (for seasonal allergies). Reported on 01/29/2016   freestyle lancets Use to test blood sugar once daily Dx code E11.8   glucose blood test strip Commonly known as:  CONTOUR NEXT TEST Use to test blood sugars 2-3 times daily.   insulin lispro 100 UNIT/ML injection Commonly known as:  HUMALOG Use 68 units daily in the V-Go pump   Insulin Pen Needle 32G X 4 MM Misc Use Three per day to inject insulin   losartan 25 MG tablet Commonly known as:  COZAAR Take 0.5 tablets (12.5 mg total) by mouth daily. Please keep upcoming appointment   metFORMIN 500 MG 24 hr tablet Commonly known as:  GLUCOPHAGE-XR TAKE 2 TABLETS BY MOUTH IN THE MORNING AND 1 TABLET IN THE EVENING   metoprolol tartrate 25 MG tablet Commonly known as:   LOPRESSOR TAKE 1 TABLET BY MOUTH TWICE DAILY   pioglitazone 15 MG tablet Commonly known as:  ACTOS TAKE 1 TABLET(15 MG) BY MOUTH DAILY   V-GO 30 Kit Give 6 units at breakfast, 8 units at lunch and 6- 8 units at  dinner       Allergies:  Allergies  Allergen Reactions  . Codeine Other (See Comments)    Patient doesn't like how it makes him feel, "spaced out, not together"    Past Medical History:  Diagnosis Date  . 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 HTN (hypertension)   . Hyperlipidemia   . Hypertrophy (benign) of prostate   . Multinodular goiter 04/12/2015  . Non-insulin dependent type 2 diabetes mellitus ([place])   . Status post non-ST elevation myocardial infarction (NSTEMI)    CATH  . Tobacco abuse   . Type II diabetes mellitus, uncontrolled ([place]) 04/12/2015    Past Surgical History:  Procedure Laterality Date  . CORONARY ANGIOPLASTY WITH STENT PLACEMENT      Family History  Problem Relation Age of Onset  . Diabetes Mother   . Diabetes Brother   . Cancer Brother   . Heart disease Neg Hx     Social History:  reports that he has been smoking.  He has never used smokeless tobacco. He reports that he does not drink alcohol or use drugs.    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 Hgb A1c MFr Bld 08/26/2017 7.5* 4.6 - 6.5 % Final   Glycemic Control Guidelines for People with Diabetes:Non Diabetic:  <6%Goal of Therapy: <7%Additional Action Suggested:  >8%   . Sodium 08/26/2017 139  135 - 145 mEq/L Final  . Potassium 08/26/2017 4.1  3.5 - 5.1 mEq/L Final  . Chloride 08/26/2017 107  96 - 112 mEq/L Final  . CO2 08/26/2017 25  19 - 32 mEq/L Final  . Glucose, Bld 08/26/2017 130* 70 - 99 mg/dL Final  . BUN 08/26/2017 20  6 - 23 mg/dL Final  . Creatinine, Ser 08/26/2017 0.80  0.40 - 1.50 mg/dL Final  . Calcium 08/26/2017 9.0  8.4 - 10.5 mg/dL Final  . GFR 08/26/2017 100.84  >60.00 mL/min Final    Physical Examination:  BP 134/80   Pulse (!) 48   Temp 98.2 F (36.8 C) (Oral)   Wt 199 lb 8 oz (90.5 kg)   SpO2 94%   BMI 27.82 kg/m      ASSESSMENT:  Diabetes type 2, uncontrolled  See history of present illness for detailed discussion of his current management, blood sugar patterns and problems identified  His A1c is improved significantly already to 7.5, previously 8.5 Fasting blood sugars are excellent although he thinks he gets low sugars during the night No documented low sugars Also not documenting any postprandial readings now  PLAN:    Continue metformin but reduce the dose only 2  tablets in the morning  If he still has low sugars overnight he can try the 20 units pump again, samples given  Consider stopping Actos if he has further weight gain  As before he can try to use the [employer] brand regular insulin if Humalog is expensive for him now  Check readings at night after supper, he can do this when he is changing his pump  Also he can reduce his bolus by 2 units at suppertime if eating lower carbohydrate meal  Also if eating early in the evening can take a bedtime snack     Patient Instructions  Stop pm of Metformin  Check sugar when sugar low and DAILY 2-3 HRS AFTER SUPPER  LO CARB meals: take 3  clicks     Tyronica Truxillo 08/27/2017, 1:17 PM   Note: This office note was prepared with Estate agent. Any transcriptional errors that result from this process are unintentional.

## 2017-09-01 DIAGNOSIS — R972 Elevated prostate specific antigen [PSA]: Secondary | ICD-10-CM | POA: Diagnosis not present

## 2017-09-08 DIAGNOSIS — N400 Enlarged prostate without lower urinary tract symptoms: Secondary | ICD-10-CM | POA: Diagnosis not present

## 2017-09-08 DIAGNOSIS — R972 Elevated prostate specific antigen [PSA]: Secondary | ICD-10-CM | POA: Diagnosis not present

## 2017-09-09 ENCOUNTER — Encounter: Payer: Self-pay | Admitting: Cardiology

## 2017-09-09 ENCOUNTER — Ambulatory Visit (INDEPENDENT_AMBULATORY_CARE_PROVIDER_SITE_OTHER): Payer: Medicare Other | Admitting: Cardiology

## 2017-09-09 VITALS — BP 136/76 | HR 48 | Ht 72.0 in | Wt 200.0 lb

## 2017-09-09 DIAGNOSIS — I2583 Coronary atherosclerosis due to lipid rich plaque: Secondary | ICD-10-CM | POA: Diagnosis not present

## 2017-09-09 DIAGNOSIS — Z9861 Coronary angioplasty status: Secondary | ICD-10-CM | POA: Diagnosis not present

## 2017-09-09 DIAGNOSIS — I252 Old myocardial infarction: Secondary | ICD-10-CM | POA: Diagnosis not present

## 2017-09-09 DIAGNOSIS — R001 Bradycardia, unspecified: Secondary | ICD-10-CM

## 2017-09-09 DIAGNOSIS — E7800 Pure hypercholesterolemia, unspecified: Secondary | ICD-10-CM

## 2017-09-09 DIAGNOSIS — D692 Other nonthrombocytopenic purpura: Secondary | ICD-10-CM

## 2017-09-09 DIAGNOSIS — I2510 Atherosclerotic heart disease of native coronary artery without angina pectoris: Secondary | ICD-10-CM

## 2017-09-09 MED ORDER — LOSARTAN POTASSIUM 25 MG PO TABS: 12.5000 mg | ORAL_TABLET | Freq: Every day | ORAL | 3 refills | Status: DC

## 2017-09-09 MED ORDER — METOPROLOL TARTRATE 25 MG PO TABS: 12.5000 mg | ORAL_TABLET | Freq: Two times a day (BID) | ORAL | 3 refills | Status: DC

## 2017-09-09 MED ORDER — ATORVASTATIN CALCIUM 40 MG PO TABS: 40.0000 mg | ORAL_TABLET | Freq: Every day | ORAL | 3 refills | Status: DC

## 2017-09-09 NOTE — Patient Instructions (Signed)
Medication Instructions:  Please decrease Metoprolol to 1/2 tablet twice a day. Continue all other medications as listed.  Follow-Up: Follow up in 1 year with Dr. Marlou Porch.  You will receive a letter in the mail 2 months before you are due.  Please call us when you receive this letter to schedule your follow up appointment.  If you need a refill on your cardiac medications before your next appointment, please call your pharmacy.  Thank you for choosing [facility]!!

## 2017-09-09 NOTE — Progress Notes (Signed)
Cardiology Office Note    Date:  09/09/2017   ID:  Alan Lopez, DOB October 13, 1946, MRN [id number]  PCP:  Janith Lima, MD  Cardiologist:   Candee Furbish, MD     History of Present Illness:  Alan Lopez is a 71 y.o. male with history of non-ST elevation myocardial infarction, hypertension, tobacco use, hyperlipidemia, diabetes here for follow up.   He was in [place] at [facility] and I reviewed consultation from 12/12/15 by Dr. Alla German of cardiology. Had chest discomfort, general uneasiness followed by left-sided chest discomfort. Pressure. No radiation to arms or jaw. He recently visited family in [place] at altitude and had no difficulty.  His EKG showed junctional bradycardia with lateral ST and T-wave inversion. Echocardiogram showed mild inferior base height both kidney cyst. Troponin was 5.7 initially. EKG personally viewed.  He underwent cardiac catheterization on 12/12/15 where he had severe disease of his right coronary artery and successful DES placement to the right coronary artery. Transient no reflow. LV function appeared preserved. Left ventricular end-diastolic pressure was 16 mmHg.His ejection fraction was mildly reduced at 45-50% with inferior wall hypokinesis.  Brilinta dyspnea. Changed to Plavix.  No bleeding. [employer]. His wife unfortunately has omental cancer.  One of his neighbors is [name].  Past Medical History:  Diagnosis Date  . 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 HTN (hypertension)   . Hyperlipidemia   . Hypertrophy (benign) of prostate   . Multinodular goiter 04/12/2015  . Non-insulin dependent type 2 diabetes mellitus ([place])   . Status post non-ST elevation myocardial infarction (NSTEMI)    CATH  . Tobacco abuse   . Type II diabetes mellitus, uncontrolled ([place]) 04/12/2015    Past Surgical History:  Procedure Laterality Date  . CORONARY ANGIOPLASTY WITH STENT PLACEMENT      Outpatient Medications Prior to Visit    Medication Sig Dispense Refill  . aspirin EC 81 MG tablet Take 81 mg by mouth daily.    . cholecalciferol (VITAMIN D) 1000 UNITS tablet Take 1,000 Units by mouth daily.    . fexofenadine (ALLEGRA) 180 MG tablet Take 180 mg by mouth daily as needed for allergies (for seasonal allergies). Reported on 01/29/2016    . finasteride (PROSCAR) 5 MG tablet Take 5 mg by mouth daily.  3  . fluticasone (FLONASE) 50 MCG/ACT nasal spray Place 2 sprays into both nostrils daily as needed for allergies (for seasonal allergies). Reported on 01/29/2016    . glucose blood (CONTOUR NEXT TEST) test strip Use to test blood sugars 2-3 times daily. 100 each 3  . Insulin Disposable Pump (V-GO 30) KIT Give 6 units at breakfast, 8 units at lunch and 6- 8 units at dinner 90 kit 3  . insulin lispro (HUMALOG) 100 UNIT/ML injection Use 68 units daily in the V-Go pump 20 mL 4  . Insulin Pen Needle 32G X 4 MM MISC Use Three per day to inject insulin 100 each 5  . Lancets (FREESTYLE) lancets Use to test blood sugar once daily Dx code E11.8 100 each 12  . metFORMIN (GLUCOPHAGE-XR) 500 MG 24 hr tablet TAKE 2 TABLETS BY MOUTH IN THE MORNING AND 1 TABLET IN THE EVENING 360 tablet 0  . pioglitazone (ACTOS) 15 MG tablet TAKE 1 TABLET(15 MG) BY MOUTH DAILY 90 tablet 0  . atorvastatin (LIPITOR) 80 MG tablet Take 1 tablet (80 mg total) by mouth daily. Please keep 09/09/17 appt for future refills (Patient taking differently: Take 40 mg by  mouth daily. Please keep 09/09/17 appt for future refills) 30 tablet 2  . losartan (COZAAR) 25 MG tablet Take 0.5 tablets (12.5 mg total) by mouth daily. Please keep upcoming appointment 45 tablet 0  . metoprolol tartrate (LOPRESSOR) 25 MG tablet TAKE 1 TABLET BY MOUTH TWICE DAILY 60 tablet 0   No facility-administered medications prior to visit.      Allergies:   Codeine   Social History   Social History  . Marital status: Married    Spouse name: N/A  . Number of children: N/A  . Years of education:  N/A   Social History Main Topics  . Smoking status: Light Tobacco Smoker    Last attempt to quit: 12/11/2015  . Smokeless tobacco: Never Used  . Alcohol use No  . Drug use: No  . Sexual activity: Not Asked   Other Topics Concern  . None   Social History Narrative  . None     Family History:  The patient's family history includes Cancer in his brother; Diabetes in his brother and mother.   ROS:   Please see the history of present illness.   Hip pain, sinus infection Review of Systems  All other systems reviewed and are negative.    PHYSICAL EXAM:   VS:  BP 136/76   Pulse (!) 48   Ht 6' (1.829 m)   Wt 200 lb (90.7 kg)   BMI 27.12 kg/m    GEN: Well nourished, well developed, in no acute distress  HEENT: normal , Blind in one eye Neck: no JVD, carotid bruits, or masses Cardiac: RRR; no murmurs, rubs, or gallops,no edema  Respiratory:  clear to auscultation bilaterally, normal work of breathing GI: soft, nontender, nondistended, + BS MS: no deformity or atrophy  Skin: warm and dry, no rash, purpura, wearing insulin patch Neuro:  Alert and Oriented x 3, Strength and sensation are intact Psych: euthymic mood, full affect   Wt Readings from Last 3 Encounters:  09/09/17 200 lb (90.7 kg)  08/27/17 199 lb 8 oz (90.5 kg)  07/17/17 193 lb 6.4 oz (87.7 kg)      Studies/Labs Reviewed:   EKG:  Today 09/09/17 shows sinus bradycardia rate 48 with no other abnormalities personally viewed-prior 12/11/15 shows accelerated junctional rhythm with deep T-wave inversion in 1, aVL, V2 as well as T-wave inversion in V4 through V6. Heart rate 86 bpm.  Recent Labs: 01/29/2017: ALT 44; Hemoglobin 14.5; Platelets 203.0; TSH 1.18 08/26/2017: BUN 20; Creatinine, Ser 0.80; Potassium 4.1; Sodium 139   Lipid Panel    Component Value Date/Time   CHOL 84 01/29/2017 1024   TRIG 70.0 01/29/2017 1024   HDL 45.40 01/29/2017 1024   CHOLHDL 2 01/29/2017 1024   VLDL 14.0 01/29/2017 1024   LDLCALC 24  01/29/2017 1024    Additional studies/ records that were reviewed today include:  Records from [facility], cardiac catheterization, EKG, lab work reviewed, consultation reviewed    ASSESSMENT:    1. History of MI (myocardial infarction)   2. S/P PTCA (percutaneous transluminal coronary angioplasty)   3. Pure hypercholesterolemia   4. Coronary artery disease due to lipid rich plaque   5. Solar purpura ([place])   6. Bradycardia      PLAN:  In order of problems listed above:  Non-ST elevation myocardial infarction  - 12/12/15 RCA DES  - EF 45-50% with basal inferior hypokinesis, mild mitral regurgitation, mild tricuspid regurgitation    - Markedly abnormal T-wave inversion on EKG. These  EKG changes resolved on subsequent EKG. Personally viewed.  - dyspnea related to Brilinta administration, transitioned to Plavix Resolved.  - Continue dual antiplatelet therapy for one year.Stopped on 12/11/2016, continue aspirin 81 mg lifelong.  - He enjoyed cardiac rehabilitation now graduated, Does go to the [facility] occasionally. He is taking care of his wife however, several appointments.  Coronary artery disease  - Right coronary artery stenting-80-90% lesion of RCA. Single DES.  - First diagonal ostial 40%.  - Aggressive secondary prevention  - Beta blocker, decreasing because of bradycardia. ARB (taking low dose for renal protection)  Hyperlipidemia  - Atorvastatin  decreased to 40 mg. continuing with moderate intensity statin, no changes.  - LDL 24, HDL 45.   Diabetes with cardiac complications  - J0D 9.4 down to 7.5, consider SLGT 2  - Per primary, Dr. Chapman Fitch and Dr Dwyane Dee  - Has blindness in one of his eyes, diabetic complication.   Solar purpura  - Easy bruising noted on forearms. Described to him.  Bradycardia -We will decrease his metoprolol to 12.5 mg twice a day. Heart rate 48 bpm We could consider discontinuation next year. Major benefit from beta blockers are seen usually within  the first several weeks following myocardial infarction.  We will see him back in one year.  Medication Adjustments/Labs and Tests Ordered: Current medicines are reviewed at length with the patient today.  Concerns regarding medicines are outlined above.  Medication changes, Labs and Tests ordered today are listed in the Patient Instructions below. Patient Instructions  Medication Instructions:  Please decrease Metoprolol to 1/2 tablet twice a day. Continue all other medications as listed.  Follow-Up: Follow up in 1 year with Dr. Marlou Porch.  You will receive a letter in the mail 2 months before you are due.  Please call us when you receive this letter to schedule your follow up appointment.  If you need a refill on your cardiac medications before your next appointment, please call your pharmacy.  Thank you for choosing [facility]!!          Signed, Candee Furbish, MD  09/09/2017 8:32 AM    [address], [place], [place]  [address] Phone: [phone]; Fax: [phone]

## 2017-09-13 ENCOUNTER — Other Ambulatory Visit: Payer: Self-pay | Admitting: Endocrinology

## 2017-09-24 ENCOUNTER — Other Ambulatory Visit: Payer: Self-pay | Admitting: Cardiology

## 2017-09-24 NOTE — Telephone Encounter (Signed)
Medication Detail    Disp Refills Start End   atorvastatin (LIPITOR) 40 MG tablet 90 tablet 3 09/09/2017    Sig - Route: Take 1 tablet (40 mg total) by mouth daily. - Oral   Sent to pharmacy as: atorvastatin (LIPITOR) 40 MG tablet   E-Prescribing Status: Receipt confirmed by pharmacy (09/09/2017 8:27 AM EDT)   Pharmacy   [id number] - [place], [address] AT [address]

## 2017-10-18 ENCOUNTER — Other Ambulatory Visit: Payer: Self-pay | Admitting: Endocrinology

## 2017-11-11 DIAGNOSIS — Z23 Encounter for immunization: Secondary | ICD-10-CM | POA: Diagnosis not present

## 2017-11-21 ENCOUNTER — Other Ambulatory Visit (INDEPENDENT_AMBULATORY_CARE_PROVIDER_SITE_OTHER): Payer: Medicare Other

## 2017-11-21 DIAGNOSIS — E1165 Type 2 diabetes mellitus with hyperglycemia: Secondary | ICD-10-CM

## 2017-11-21 DIAGNOSIS — Z794 Long term (current) use of insulin: Secondary | ICD-10-CM

## 2017-11-21 LAB — COMPREHENSIVE METABOLIC PANEL
ALT: 37 U/L (ref 0–53)
AST: 24 U/L (ref 0–37)
Albumin: 3.7 g/dL (ref 3.5–5.2)
Alkaline Phosphatase: 60 U/L (ref 39–117)
BUN: 21 mg/dL (ref 6–23)
CALCIUM: 8.7 mg/dL (ref 8.4–10.5)
CHLORIDE: 105 meq/L (ref 96–112)
CO2: 26 meq/L (ref 19–32)
CREATININE: 0.91 mg/dL (ref 0.40–1.50)
GFR: 86.85 mL/min (ref >60.00)
Glucose, Bld: 167 mg/dL — ABNORMAL HIGH (ref 70–99)
Potassium: 4.3 mEq/L (ref 3.5–5.1)
Sodium: 137 mEq/L (ref 135–145)
Total Bilirubin: 0.8 mg/dL (ref 0.2–1.2)
Total Protein: 6.2 g/dL (ref 6.0–8.3)

## 2017-11-21 LAB — HEMOGLOBIN A1C: Hgb A1c MFr Bld: 7.7 % — ABNORMAL HIGH (ref 4.6–6.5)

## 2017-11-21 LAB — MICROALBUMIN / CREATININE URINE RATIO
CREATININE, U: 94.8 mg/dL
Microalb Creat Ratio: 0.7 mg/g (ref 0.0–30.0)

## 2017-11-26 ENCOUNTER — Ambulatory Visit (INDEPENDENT_AMBULATORY_CARE_PROVIDER_SITE_OTHER): Payer: Medicare Other | Admitting: Endocrinology

## 2017-11-26 ENCOUNTER — Encounter: Payer: Self-pay | Admitting: Endocrinology

## 2017-11-26 VITALS — BP 132/80 | HR 63 | Ht 72.0 in | Wt 205.0 lb

## 2017-11-26 DIAGNOSIS — Z794 Long term (current) use of insulin: Secondary | ICD-10-CM | POA: Diagnosis not present

## 2017-11-26 DIAGNOSIS — E1165 Type 2 diabetes mellitus with hyperglycemia: Secondary | ICD-10-CM

## 2017-11-26 DIAGNOSIS — I2583 Coronary atherosclerosis due to lipid rich plaque: Secondary | ICD-10-CM | POA: Diagnosis not present

## 2017-11-26 DIAGNOSIS — I2510 Atherosclerotic heart disease of native coronary artery without angina pectoris: Secondary | ICD-10-CM | POA: Diagnosis not present

## 2017-11-26 NOTE — Progress Notes (Signed)
Patient ID: Alan Lopez, male   DOB: 01-07-1946, 71 y.o.   MRN: [id number]           Reason for Appointment: Follow-up for Type 2 Diabetes   History of Present Illness:          Date of diagnosis of type 2 diabetes mellitus :  1999      Past history:  He had modestly increase blood sugars at diagnosis and was started on metformin Subsequently glipizide was added to improve his control. He thinks that his blood sugars have been mostly poorly controlled over the last several years He had been on glipizide and metformin for years without any adjustment of his dosage of glipizide  Review of his A1c shows that it had ranged from 9-9.5 since 07/2013 Previously his PCP tried him on a sample of Jardiance 10 mg without significant improvement  Recent history:   Oral hypoglycemic drugs the patient is taking are: metformin ER 500 mg, 2 tablets in the morning and 1 in the evening  Current regimen:  V-go pump SETTINGS: Basal rate = 30 units BOLUSES = 6 units at breakfast, 8 units at lunch and  8 units at dinner  His A1c is Now 7.7, Previous range 7.5-8.5 most recently down to 7.5, previously was up to 8.5  Current blood sugar patterns and problems identified:  He was started on the V-go pump on 07/02/17 because of inconsistent control and poor compliance with mealtime insulin  Today after much discussion he finally reveals that he is not always changing his pump at the same time and maybe waiting till he is running out of clicks on his pump  Also complaining about skin irritation with the V-go pump and itching despite using a barrier tape that was recommended  Again despite reminders he is not doing POSTPRANDIAL readings except last evening  His glucose was low  He is asking about his weight gain, he thinks he is not changing his eating habits and trying to eat relatively low carbohydrate meals also fairly regularly  FASTING blood sugars are quite variable and not clear what is  causing the variability  On his last visit he was feeling periodic hypoglycemic symptoms during the night around 3 AM but this is much better now with stopping his metformin in the evening  This has happened only a couple of times in the last month  Has not done much exercise  Side effects from medications have been: Mild  diarrhea from regular metformin  Compliance with the medical regimen: fair   Hypoglycemia: never   Glucose monitoring:  done once a day usually       Glucometer:   Contour   FASTING blood sugar range 87-218 with AVERAGE 157  After supper 137 last night   Self-care: The diet that the patient has been following is: tries to limit high carbohydrate foods. eating out about 4 times a week     Typical meal intake: Breakfast is  cereal/meat .  Sometimes will have fried food in the evening Usually avoiding drinks with sugar                Dietician visit, most recent:never CDE visit: 01/2016               Exercise:  minimal recently  Weight history: Maximum weight 215 previously  Wt Readings from Last 3 Encounters:  11/26/17 205 lb (93 kg)  09/09/17 200 lb (90.7 kg)  08/27/17 199 lb 8 oz (90.5  kg)    Glycemic control: last A1c 9.2 done on 02/13/15 Microalbumin ratio normal in 3/16 from PCP    Lab Results  Component Value Date   HGBA1C 7.7 (H) 11/21/2017   HGBA1C 7.5 (H) 08/26/2017   HGBA1C 8.5 (H) 04/23/2017   Lab Results  Component Value Date   MICROALBUR <0.7 11/21/2017   [place] 24 01/29/2017   CREATININE 0.91 11/21/2017    Lab on 11/21/2017  Component Date Value Ref Range Status  . Microalb, Ur 11/21/2017 <0.7  0.0 - 1.9 mg/dL Final  . Creatinine,U 11/21/2017 94.8  mg/dL Final  . Microalb Creat Ratio 11/21/2017 0.7  0.0 - 30.0 mg/g Final  . Sodium 11/21/2017 137  135 - 145 mEq/L Final  . Potassium 11/21/2017 4.3  3.5 - 5.1 mEq/L Final  . Chloride 11/21/2017 105  96 - 112 mEq/L Final  . CO2 11/21/2017 26  19 - 32 mEq/L Final  . Glucose, Bld  11/21/2017 167* 70 - 99 mg/dL Final  . BUN 11/21/2017 21  6 - 23 mg/dL Final  . Creatinine, Ser 11/21/2017 0.91  0.40 - 1.50 mg/dL Final  . Total Bilirubin 11/21/2017 0.8  0.2 - 1.2 mg/dL Final  . Alkaline Phosphatase 11/21/2017 60  39 - 117 U/L Final  . AST 11/21/2017 24  0 - 37 U/L Final  . ALT 11/21/2017 37  0 - 53 U/L Final  . Total Protein 11/21/2017 6.2  6.0 - 8.3 g/dL Final  . Albumin 11/21/2017 3.7  3.5 - 5.2 g/dL Final  . Calcium 11/21/2017 8.7  8.4 - 10.5 mg/dL Final  . GFR 11/21/2017 86.85  >60.00 mL/min Final  . Hgb A1c MFr Bld 11/21/2017 7.7* 4.6 - 6.5 % Final   Glycemic Control Guidelines for People with Diabetes:Non Diabetic:  <6%Goal of Therapy: <7%Additional Action Suggested:  >8%       Allergies as of 11/26/2017      Reactions   Codeine Other (See Comments)   Patient doesn't like how it makes him feel, "spaced out, not together"      Medication List        Accurate as of 11/26/17 11:22 AM. Always use your most recent med list.          aspirin EC 81 MG tablet Take 81 mg by mouth daily.   atorvastatin 40 MG tablet Commonly known as:  LIPITOR Take 1 tablet (40 mg total) by mouth daily.   cholecalciferol 1000 units tablet Commonly known as:  VITAMIN D Take 1,000 Units by mouth daily.   CONTOUR NEXT TEST test strip Generic drug:  glucose blood USE TO TEST BLOOD SUGAR 2-3 TIMES DAILY   fexofenadine 180 MG tablet Commonly known as:  ALLEGRA Take 180 mg by mouth daily as needed for allergies (for seasonal allergies). Reported on 01/29/2016   finasteride 5 MG tablet Commonly known as:  PROSCAR Take 5 mg by mouth daily.   fluticasone 50 MCG/ACT nasal spray Commonly known as:  FLONASE Place 2 sprays into both nostrils daily as needed for allergies (for seasonal allergies). Reported on 01/29/2016   freestyle lancets Use to test blood sugar once daily Dx code E11.8   insulin lispro 100 UNIT/ML injection Commonly known as:  HUMALOG Use 68 units daily  in the V-Go pump   Insulin Pen Needle 32G X 4 MM Misc Use Three per day to inject insulin   insulin regular 100 units/mL injection Commonly known as:  NOVOLIN R,HUMULIN R Inject 68 Units into the  skin 3 (three) times daily before meals.   losartan 25 MG tablet Commonly known as:  COZAAR Take 0.5 tablets (12.5 mg total) by mouth daily.   metFORMIN 500 MG 24 hr tablet Commonly known as:  GLUCOPHAGE-XR TAKE 2 TABLETS BY MOUTH IN THE MORNING AND 1 TABLET IN THE EVENING   metoprolol tartrate 25 MG tablet Commonly known as:  LOPRESSOR Take 0.5 tablets (12.5 mg total) by mouth 2 (two) times daily.   pioglitazone 15 MG tablet Commonly known as:  ACTOS TAKE 1 TABLET(15 MG) BY MOUTH DAILY   V-GO 30 Kit Give 6 units at breakfast, 8 units at lunch and 6- 8 units at dinner       Allergies:  Allergies  Allergen Reactions  . Codeine Other (See Comments)    Patient doesn't like how it makes him feel, "spaced out, not together"    Past Medical History:  Diagnosis Date  . 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 HTN (hypertension)   . Hyperlipidemia   . Hypertrophy (benign) of prostate   . Multinodular goiter 04/12/2015  . Non-insulin dependent type 2 diabetes mellitus ([place])   . Status post non-ST elevation myocardial infarction (NSTEMI)    CATH  . Tobacco abuse   . Type II diabetes mellitus, uncontrolled ([place]) 04/12/2015    Past Surgical History:  Procedure Laterality Date  . CORONARY ANGIOPLASTY WITH STENT PLACEMENT      Family History  Problem Relation Age of Onset  . Diabetes Mother   . Diabetes Brother   . Cancer Brother   . Heart disease Neg Hx     Social History:  reports that he has been smoking.  he has never used smokeless tobacco. He reports that he does not drink alcohol or use drugs.    Review of Systems     Lipid history: Is on aggressive statin treatment because of CAD Followed by cardiologist, since his MI he has been on 80 mg Lipitor Labs as follows:     Lab Results  Component Value Date   CHOL 84 01/29/2017   HDL 45.40 01/29/2017   [place] 24 01/29/2017   TRIG 70.0 01/29/2017   CHOLHDL 2 01/29/2017           Eyes: He has had blindness in his left eye from old injury    THYROID: He was found to have a 2 cm nodule on his left thyroid on his exam but ultrasound showed multinodular goiter with mostly small nodules and the largest nodule on the left side of 1.4 cm has the appearance of a pseudo- nodule  Thyroid levels normal in the past  Lab Results  Component Value Date   TSH 1.18 01/29/2017     LABS:  Lab on 11/21/2017  Component Date Value Ref Range Status  . Microalb, Ur 11/21/2017 <0.7  0.0 - 1.9 mg/dL Final  . Creatinine,U 11/21/2017 94.8  mg/dL Final  . Microalb Creat Ratio 11/21/2017 0.7  0.0 - 30.0 mg/g Final  . Sodium 11/21/2017 137  135 - 145 mEq/L Final  . Potassium 11/21/2017 4.3  3.5 - 5.1 mEq/L Final  . Chloride 11/21/2017 105  96 - 112 mEq/L Final  . CO2 11/21/2017 26  19 - 32 mEq/L Final  . Glucose, Bld 11/21/2017 167* 70 - 99 mg/dL Final  . BUN 11/21/2017 21  6 - 23 mg/dL Final  . Creatinine, Ser 11/21/2017 0.91  0.40 - 1.50 mg/dL Final  . Total Bilirubin 11/21/2017 0.8  0.2 -  1.2 mg/dL Final  . Alkaline Phosphatase 11/21/2017 60  39 - 117 U/L Final  . AST 11/21/2017 24  0 - 37 U/L Final  . ALT 11/21/2017 37  0 - 53 U/L Final  . Total Protein 11/21/2017 6.2  6.0 - 8.3 g/dL Final  . Albumin 11/21/2017 3.7  3.5 - 5.2 g/dL Final  . Calcium 11/21/2017 8.7  8.4 - 10.5 mg/dL Final  . GFR 11/21/2017 86.85  >60.00 mL/min Final  . Hgb A1c MFr Bld 11/21/2017 7.7* 4.6 - 6.5 % Final   Glycemic Control Guidelines for People with Diabetes:Non Diabetic:  <6%Goal of Therapy: <7%Additional Action Suggested:  >8%     Physical Examination:  BP 132/80   Pulse 63   Ht 6' (1.829 m)   Wt 205 lb (93 kg)   SpO2 93%   BMI 27.80 kg/m      ASSESSMENT:  Diabetes type 2, uncontrolled  See history of present illness for  detailed discussion of his current management, blood sugar patterns and problems identified  Although his A1c is 7.7 he is having significant variability in his overnight blood sugars and not clear if he is having high readings after meals due to lack of adequate monitoring Discussed A1c being an average of all his readings throughout the day and need to do better with his monitoring  Currently with his V-go pump he is doing fairly well with mealtime insulin regimen compared to injections However having local irritation of the adhesive Also his not understanding the need to change the pump every 24 hours and will go longer Taking less than the prescribed dose of metformin because of previous tendency to overnight hypoglycemia  Recently getting weight gain probably from lack of exercise  PLAN:    Have discussed the option of changing to the Omnipod pump.  Showed him a sample and discussed in detail how this is different from the V-go  He will look into this and try out the sample to make sure it is not causing skin irritation  Consider Humalog again if better covered May stay at  However in the meantime he needs to try and make sure he is bolusing up to 30 min before eating  He will change the V-go pump every 24 hours consistently  May try the upper arm for his infusion sites  Currently does not want to use a steroid cream for his skin irritation, continue to use barrier dressing  He probably needs to take 10 units bolus instead of 8 for his evening meals unless this is low carbohydrate  Discussed postprandial monitoring    Patient Instructions  5 clicks for full meals at dinner and 4 clicks for low carb  Change pump same time daily     Counseling time on subjects discussed in assessment and plan sections is over 50% of today's 25 minute visit   Elayne Snare 11/26/2017, 11:22 AM   Note: This office note was prepared with Dragon voice recognition system technology. Any  transcriptional errors that result from this process are unintentional.

## 2017-11-26 NOTE — Patient Instructions (Signed)
5 clicks for full meals at dinner and 4 clicks for low carb  Change pump same time daily

## 2017-12-03 ENCOUNTER — Other Ambulatory Visit: Payer: Self-pay | Admitting: Cardiology

## 2017-12-19 ENCOUNTER — Telehealth: Payer: Self-pay | Admitting: Internal Medicine

## 2017-12-19 NOTE — Telephone Encounter (Signed)
Spoke with Mr. Horsch regarding AWV. Pt stated that he will give office a call when he is ready to schedule his wellness appointment.

## 2017-12-30 ENCOUNTER — Other Ambulatory Visit: Payer: Medicare Other

## 2017-12-30 ENCOUNTER — Encounter: Payer: Self-pay | Admitting: Internal Medicine

## 2017-12-30 ENCOUNTER — Ambulatory Visit (INDEPENDENT_AMBULATORY_CARE_PROVIDER_SITE_OTHER): Payer: Medicare Other | Admitting: Internal Medicine

## 2017-12-30 VITALS — BP 130/70 | HR 49 | Temp 97.8°F | Resp 16 | Ht 72.0 in | Wt 210.0 lb

## 2017-12-30 DIAGNOSIS — D485 Neoplasm of uncertain behavior of skin: Secondary | ICD-10-CM

## 2017-12-30 DIAGNOSIS — B078 Other viral warts: Secondary | ICD-10-CM | POA: Diagnosis not present

## 2017-12-30 DIAGNOSIS — L02212 Cutaneous abscess of back [any part, except buttock]: Secondary | ICD-10-CM | POA: Diagnosis not present

## 2017-12-30 DIAGNOSIS — B079 Viral wart, unspecified: Secondary | ICD-10-CM | POA: Diagnosis not present

## 2017-12-30 NOTE — Patient Instructions (Signed)
Incision and Drainage Incision and drainage is a surgical procedure to open and drain a fluid-filled sac. The sac may be filled with pus, mucus, or blood. Examples of fluid-filled sacs that may need surgical drainage include cysts, skin infections (abscesses), and red lumps that develop from a ruptured cyst or a small abscess (boils). You may need this procedure if the affected area is large, painful, infected, or not healing well. Tell a health care provider about:  Any allergies you have.  All medicines you are taking, including vitamins, herbs, eye drops, creams, and over-the-counter medicines.  Any problems you or family members have had with anesthetic medicines.  Any blood disorders you have.  Any surgeries you have had.  Any medical conditions you have.  Whether you are pregnant or may be pregnant. What are the risks? Generally, this is a safe procedure. However, problems may occur, including:  Infection.  Bleeding.  Allergic reactions to medicines.  Scarring.  What happens before the procedure?  You may need an ultrasound or other imaging tests to see how large or deep the fluid-filled sac is.  You may have blood tests to check for infection.  You may get a tetanus shot.  You may be given antibiotic medicine to help prevent infection.  Follow instructions from your health care provider about eating or drinking restrictions.  Ask your health care provider about: ? Changing or stopping your regular medicines. This is especially important if you are taking diabetes medicines or blood thinners. ? Taking medicines such as aspirin and ibuprofen. These medicines can thin your blood. Do not take these medicines before your procedure if your health care provider instructs you not to.  Plan to have someone take you home after the procedure.  If you will be going home right after the procedure, plan to have someone stay with you for 24 hours. What happens during the  procedure?  To reduce your risk of infection: ? Your health care team will wash or sanitize their hands. ? Your skin will be washed with soap.  You will be given one or more of the following: ? A medicine to help you relax (sedative). ? A medicine to numb the area (local anesthetic). ? A medicine to make you fall asleep (general anesthetic).  An incision will be made in the top of the fluid-filled sac.  The contents of the sac may be squeezed out, or a syringe or tube (catheter)may be used to empty the sac.  The catheter may be left in place for several weeks to drain any fluid. Or, your health care provider may stitch open the edges of the incision to make a long-term opening for drainage (marsupialization).  The inside of the sac may be washed out (irrigated) with a sterile solution and packed with gauze before it is covered with a bandage (dressing). The procedure may vary among health care providers and hospitals. What happens after the procedure?  Your blood pressure, heart rate, breathing rate, and blood oxygen level will be monitored often until the medicines you were given have worn off.  Do not drive for 24 hours if you received a sedative. This information is not intended to replace advice given to you by your health care provider. Make sure you discuss any questions you have with your health care provider. Document Released: 05/21/2001 Document Revised: 05/02/2016 Document Reviewed: 09/15/2015 Elsevier Interactive Patient Education  2018 Elsevier Inc.  

## 2017-12-30 NOTE — Progress Notes (Signed)
Subjective:  Patient ID: Alan Lopez, male    DOB: 03-25-1946  Age: 72 y.o. MRN: [id number]  CC: Recurrent Skin Infections   HPI [name] presents for concerns about his skin.  He has 2 lesions over his left calf that have been enlarging over the last month.  One is behind the knee and is small.  One is on the calf and its large.  He also complains of a several week history of enlarging, red, painful lesion on his left mid back.  It is not been draining pus.  Outpatient Medications Prior to Visit  Medication Sig Dispense Refill  . aspirin EC 81 MG tablet Take 81 mg by mouth daily.    Marland Kitchen atorvastatin (LIPITOR) 40 MG tablet Take 1 tablet (40 mg total) by mouth daily. 90 tablet 3  . cholecalciferol (VITAMIN D) 1000 UNITS tablet Take 1,000 Units by mouth daily.    . CONTOUR NEXT TEST test strip USE TO TEST BLOOD SUGAR 2-3 TIMES DAILY 200 each 2  . fexofenadine (ALLEGRA) 180 MG tablet Take 180 mg by mouth daily as needed for allergies (for seasonal allergies). Reported on 01/29/2016    . finasteride (PROSCAR) 5 MG tablet Take 5 mg by mouth daily.  3  . fluticasone (FLONASE) 50 MCG/ACT nasal spray Place 2 sprays into both nostrils daily as needed for allergies (for seasonal allergies). Reported on 01/29/2016    . Insulin Disposable Pump (V-GO 30) KIT Give 6 units at breakfast, 8 units at lunch and 6- 8 units at dinner 90 kit 3  . insulin lispro (HUMALOG) 100 UNIT/ML injection Use 68 units daily in the V-Go pump 20 mL 4  . Insulin Pen Needle 32G X 4 MM MISC Use Three per day to inject insulin 100 each 5  . insulin regular (NOVOLIN R,HUMULIN R) 100 units/mL injection Inject 68 Units into the skin 3 (three) times daily before meals.    . Lancets (FREESTYLE) lancets Use to test blood sugar once daily Dx code E11.8 100 each 12  . losartan (COZAAR) 25 MG tablet Take 0.5 tablets (12.5 mg total) by mouth daily. 45 tablet 3  . metFORMIN (GLUCOPHAGE-XR) 500 MG 24 hr tablet TAKE 2 TABLETS BY  MOUTH IN THE MORNING AND 1 TABLET IN THE EVENING 360 tablet 0  . metoprolol tartrate (LOPRESSOR) 25 MG tablet Take 0.5 tablets (12.5 mg total) by mouth 2 (two) times daily. 90 tablet 3  . pioglitazone (ACTOS) 15 MG tablet TAKE 1 TABLET(15 MG) BY MOUTH DAILY 90 tablet 0  . atorvastatin (LIPITOR) 80 MG tablet TAKE 1 TABLET( 80 MG TOTAL) BY MOUTH DAILY 30 tablet 10   No facility-administered medications prior to visit.     ROS Review of Systems  Constitutional: Negative for chills, fatigue and fever.  HENT: Negative.   Eyes: Negative.   Respiratory: Negative for cough, chest tightness and wheezing.   Cardiovascular: Negative for chest pain, palpitations and leg swelling.  Gastrointestinal: Negative for abdominal pain.  Endocrine: Negative.   Genitourinary: Negative.   Musculoskeletal: Negative.   Skin: Positive for rash.  Hematological: Negative.   Psychiatric/Behavioral: Negative.     Objective:  BP 130/70 (BP Location: Left Arm, Patient Position: Sitting, Cuff Size: Normal)   Pulse (!) 49   Temp 97.8 F (36.6 C) (Oral)   Resp 16   Ht 6' (1.829 m)   Wt 210 lb (95.3 kg)   SpO2 98%   BMI 28.48 kg/m   BP Readings from Last 3  Encounters:  01/01/18 140/80  12/30/17 130/70  11/26/17 132/80    Wt Readings from Last 3 Encounters:  01/01/18 208 lb 12 oz (94.7 kg)  12/30/17 210 lb (95.3 kg)  11/26/17 205 lb (93 kg)    Physical Exam  Pulmonary/Chest:      The area was cleaned and Betadine.  It was prepped and draped in sterile fashion.  Local anesthesia was obtained with instillation of 1% lidocaine with epi.  2 cc were used.  Next a 6 mm punch incision was made and a scant amount of purulent exudate and the contents of an epidermal inclusion cyst were easily removed.  The cavity was cleaned with hydrogen peroxide and a Q-tip.  No loculations were noted.  It was packed with iodoform.  A culture was collected and sent.  Musculoskeletal:       Legs: 1 - 3 cycles of freeze  and thaw cryotherapy were applied to the lesion behind the left knee.  2 -this lesion did not respond to cryotherapy.  I obtained anesthesia by injecting the skin underneath the lesion with 1.5 cc of 1% lidocaine with epi.  I then used a shave excision technique to remove the lesion.  I created hemostasis using a silver nitrate tip.  Tolerated this well with no complications.    Lab Results  Component Value Date   WBC 8.8 01/29/2017   HGB 14.5 01/29/2017   HCT 42.2 01/29/2017   PLT 203.0 01/29/2017   GLUCOSE 167 (H) 11/21/2017   CHOL 84 01/29/2017   TRIG 70.0 01/29/2017   HDL 45.40 01/29/2017   LDLCALC 24 01/29/2017   ALT 37 11/21/2017   AST 24 11/21/2017   NA 137 11/21/2017   K 4.3 11/21/2017   CL 105 11/21/2017   CREATININE 0.91 11/21/2017   BUN 21 11/21/2017   CO2 26 11/21/2017   TSH 1.18 01/29/2017   HGBA1C 7.7 (H) 11/21/2017   MICROALBUR <0.7 11/21/2017    Mm Diag Breast Tomo Bilateral  Result Date: 02/12/2017 CLINICAL DATA:  Nipple tenderness EXAM: 2D DIGITAL DIAGNOSTIC BILATERAL MAMMOGRAM WITH CAD AND ADJUNCT TOMO COMPARISON:  Previous exam(s). ACR Breast Density Category a: The breast tissue is almost entirely fatty. FINDINGS: No suspicious masses, calcifications, distortion, or gynecomastia. Mammographic images were processed with CAD. IMPRESSION: No mammographic evidence of malignancy. RECOMMENDATION: Treatment of the patient's symptoms should be based on clinical and physical exam given the lack of imaging findings. I have discussed the findings and recommendations with the patient. Results were also provided in writing at the conclusion of the visit. If applicable, a reminder letter will be sent to the patient regarding the next appointment. BI-RADS CATEGORY  1: Negative. Electronically Signed   By: Dorise Bullion III M.D   On: 02/12/2017 10:14    Assessment & Plan:   Luc was seen today for recurrent skin infections.  Diagnoses and all orders for this  visit:  Neoplasm of uncertain behavior of skin- The specimen has been removed from the skin.  It is sent for pathology to screen for malignancy and to identify the cause for this lesion. -     Dermatology pathology; Future  Abscess of back- Incision and drainage performed.  I do not think there is a serious bacterial infection so at this time so will not start an antibiotic.  I may change this decision after the culture is reported.  He will return in 2 days for recheck and packing removal. -     Wound culture;  Future  Viral warts, unspecified type- Cryotherapy applied.  He was advised that he may need to have this treated again in 2-3 weeks for total eradication.   I am having [name] maintain his finasteride, cholecalciferol, aspirin EC, fexofenadine, fluticasone, Insulin Pen Needle, freestyle, insulin lispro, V-GO 30, metFORMIN, atorvastatin, metoprolol tartrate, losartan, CONTOUR NEXT TEST, pioglitazone, insulin regular, and atorvastatin.  No orders of the defined types were placed in this encounter.    Follow-up: Return in about 2 days (around 01/01/2018).  [name], MD

## 2018-01-01 ENCOUNTER — Encounter: Payer: Self-pay | Admitting: Internal Medicine

## 2018-01-01 ENCOUNTER — Ambulatory Visit (INDEPENDENT_AMBULATORY_CARE_PROVIDER_SITE_OTHER): Payer: Medicare Other | Admitting: Internal Medicine

## 2018-01-01 VITALS — BP 140/80 | HR 53 | Temp 98.4°F | Resp 16 | Ht 72.0 in | Wt 208.8 lb

## 2018-01-01 DIAGNOSIS — L02212 Cutaneous abscess of back [any part, except buttock]: Secondary | ICD-10-CM

## 2018-01-01 DIAGNOSIS — Z23 Encounter for immunization: Secondary | ICD-10-CM | POA: Diagnosis not present

## 2018-01-01 DIAGNOSIS — D485 Neoplasm of uncertain behavior of skin: Secondary | ICD-10-CM

## 2018-01-01 DIAGNOSIS — B078 Other viral warts: Secondary | ICD-10-CM

## 2018-01-02 DIAGNOSIS — L02212 Cutaneous abscess of back [any part, except buttock]: Secondary | ICD-10-CM | POA: Insufficient documentation

## 2018-01-02 DIAGNOSIS — D485 Neoplasm of uncertain behavior of skin: Secondary | ICD-10-CM | POA: Insufficient documentation

## 2018-01-02 DIAGNOSIS — B079 Viral wart, unspecified: Secondary | ICD-10-CM | POA: Insufficient documentation

## 2018-01-02 LAB — WOUND CULTURE
MICRO NUMBER: 90090428
SPECIMEN QUALITY: ADEQUATE

## 2018-01-05 ENCOUNTER — Encounter: Payer: Self-pay | Admitting: Internal Medicine

## 2018-01-05 NOTE — Progress Notes (Signed)
Subjective:  Patient ID: Alan Lopez, male    DOB: [birth year]  Age: 72 y.o. MRN: [id number]  CC: Wound Check   HPI Alan Lopez presents for f/up - He underwent an incision and drainage 2 days ago.  The culture is coming back negative.  The area feels better with no pain, redness, or swelling.  He also underwent a shave excision of a lesion on his left calf.  This returned as a verrucous lesion with no atypia.  The site has healed well.  Outpatient Medications Prior to Visit  Medication Sig Dispense Refill  . aspirin EC 81 MG tablet Take 81 mg by mouth daily.    Marland Kitchen atorvastatin (LIPITOR) 80 MG tablet TAKE 1 TABLET( 80 MG TOTAL) BY MOUTH DAILY 30 tablet 10  . cholecalciferol (VITAMIN D) 1000 UNITS tablet Take 1,000 Units by mouth daily.    . CONTOUR NEXT TEST test strip USE TO TEST BLOOD SUGAR 2-3 TIMES DAILY 200 each 2  . fexofenadine (ALLEGRA) 180 MG tablet Take 180 mg by mouth daily as needed for allergies (for seasonal allergies). Reported on 01/29/2016    . finasteride (PROSCAR) 5 MG tablet Take 5 mg by mouth daily.  3  . fluticasone (FLONASE) 50 MCG/ACT nasal spray Place 2 sprays into both nostrils daily as needed for allergies (for seasonal allergies). Reported on 01/29/2016    . Insulin Disposable Pump (V-GO 30) KIT Give 6 units at breakfast, 8 units at lunch and 6- 8 units at dinner 90 kit 3  . insulin lispro (HUMALOG) 100 UNIT/ML injection Use 68 units daily in the V-Go pump 20 mL 4  . Insulin Pen Needle 32G X 4 MM MISC Use Three per day to inject insulin 100 each 5  . insulin regular (NOVOLIN R,HUMULIN R) 100 units/mL injection Inject 68 Units into the skin 3 (three) times daily before meals.    . Lancets (FREESTYLE) lancets Use to test blood sugar once daily Dx code E11.8 100 each 12  . losartan (COZAAR) 25 MG tablet Take 0.5 tablets (12.5 mg total) by mouth daily. 45 tablet 3  . metFORMIN (GLUCOPHAGE-XR) 500 MG 24 hr tablet TAKE 2 TABLETS BY MOUTH IN THE MORNING AND 1  TABLET IN THE EVENING 360 tablet 0  . metoprolol tartrate (LOPRESSOR) 25 MG tablet Take 0.5 tablets (12.5 mg total) by mouth 2 (two) times daily. 90 tablet 3  . pioglitazone (ACTOS) 15 MG tablet TAKE 1 TABLET(15 MG) BY MOUTH DAILY 90 tablet 0  . atorvastatin (LIPITOR) 40 MG tablet Take 1 tablet (40 mg total) by mouth daily. 90 tablet 3   No facility-administered medications prior to visit.     ROS Review of Systems  All other systems reviewed and are negative.   Objective:  BP 140/80 (BP Location: Left Arm, Patient Position: Sitting, Cuff Size: Normal)   Pulse (!) 53   Temp 98.4 F (36.9 C) (Oral)   Resp 16   Ht 6' (1.829 m)   Wt 208 lb 12 oz (94.7 kg)   SpO2 94%   BMI 28.31 kg/m   BP Readings from Last 3 Encounters:  01/01/18 140/80  12/30/17 130/70  11/26/17 132/80    Wt Readings from Last 3 Encounters:  01/01/18 208 lb 12 oz (94.7 kg)  12/30/17 210 lb (95.3 kg)  11/26/17 205 lb (93 kg)    Physical Exam  Pulmonary/Chest:      Musculoskeletal:       Legs:   Lab Results  Component  Value Date   WBC 8.8 01/29/2017   HGB 14.5 01/29/2017   HCT 42.2 01/29/2017   PLT 203.0 01/29/2017   GLUCOSE 167 (H) 11/21/2017   CHOL 84 01/29/2017   TRIG 70.0 01/29/2017   HDL 45.40 01/29/2017   LDLCALC 24 01/29/2017   ALT 37 11/21/2017   AST 24 11/21/2017   NA 137 11/21/2017   K 4.3 11/21/2017   CL 105 11/21/2017   CREATININE 0.91 11/21/2017   BUN 21 11/21/2017   CO2 26 11/21/2017   TSH 1.18 01/29/2017   HGBA1C 7.7 (H) 11/21/2017   MICROALBUR <0.7 11/21/2017    Mm Diag Breast Tomo Bilateral  Result Date: 02/12/2017 CLINICAL DATA:  Nipple tenderness EXAM: 2D DIGITAL DIAGNOSTIC BILATERAL MAMMOGRAM WITH CAD AND ADJUNCT TOMO COMPARISON:  Previous exam(s). ACR Breast Density Category a: The breast tissue is almost entirely fatty. FINDINGS: No suspicious masses, calcifications, distortion, or gynecomastia. Mammographic images were processed with CAD. IMPRESSION: No  mammographic evidence of malignancy. RECOMMENDATION: Treatment of the patient's symptoms should be based on clinical and physical exam given the lack of imaging findings. I have discussed the findings and recommendations with the patient. Results were also provided in writing at the conclusion of the visit. If applicable, a reminder letter will be sent to the patient regarding the next appointment. BI-RADS CATEGORY  1: Negative. Electronically Signed   By: Alan Lopez M.D   On: 02/12/2017 10:14    Assessment & Plan:   Alan Lopez was seen today for wound check.  Diagnoses and all orders for this visit:  Need for pneumococcal vaccination -     Pneumococcal polysaccharide vaccine 23-valent greater than or equal to 2yo subcutaneous/IM  Abscess of back- This has resolved.  The culture is negative.  Antibiotic therapy is not indicated.  It did not need to be repacked today.  Neoplasm of uncertain behavior of skin-  This was a verrucous lesion and has been removed.  Other viral warts   I am having Alan Lopez maintain his finasteride, cholecalciferol, aspirin EC, fexofenadine, fluticasone, Insulin Pen Needle, freestyle, insulin lispro, V-GO 30, metFORMIN, metoprolol tartrate, losartan, CONTOUR NEXT TEST, pioglitazone, insulin regular, and atorvastatin.  No orders of the defined types were placed in this encounter.    Follow-up: No Follow-up on file.  Alan Calico, MD

## 2018-01-05 NOTE — Patient Instructions (Signed)
Warts Warts are small growths on the skin. They are common and can occur on various areas of the body. A person may have one wart or multiple warts. Most warts are not painful, and they usually do not cause problems. However, warts can cause pain if they are large or occur in an area of the body where pressure will be applied to them, such as the bottom of the foot. In many cases, warts do not require treatment. They usually go away on their own over a period of many months to a couple years. Various treatments may be done for warts that cause problems or do not go away. Sometimes, warts go away and then come back again. What are the causes? Warts are caused by a type of virus that is called human papillomavirus (HPV). This virus can spread from person to person through direct contact. Warts can also spread to other areas of the body when a person scratches a wart and then scratches another area of his or her body. What increases the risk? Warts are more likely to develop in:  People who are 44-52 years of age.  People who have a weakened body defense system (immune system).  What are the signs or symptoms? A wart may be round or oval or have an irregular shape. Most warts have a rough surface. Warts may range in color from skin color to light yellow, brown, or gray. They are generally less than  inch (1.3 cm) in size. Most warts are painless, but some can be painful when pressure is applied to them. How is this diagnosed? A wart can usually be diagnosed from its appearance. In some cases, a tissue sample may be removed (biopsy) to be looked at under a microscope. How is this treated? In many cases, warts do not need treatment. If treatment is needed, options may include:  Applying medicated solutions, creams, or patches to the wart. These may be over-the-counter or prescription medicines that make the skin soft so that layers will gradually shed away. In many cases, the medicine is applied one  or two times per day and covered with a bandage.  Putting duct tape over the top of the wart (occlusion). You will leave the tape in place for as long as told by your health care provider, then you will replace it with a new strip of tape. This is done until the wart goes away.  Freezing the wart with liquid nitrogen (cryotherapy).  Burning the wart with: ? Laser treatment. ? An electrified probe (electrocautery).  Injection of a medicine (Candida antigen) into the wart to help the body's immune system to fight off the wart.  Surgery to remove the wart.  Follow these instructions at home:  Apply over-the-counter and prescription medicines only as told by your health care provider.  Do not apply over-the-counter wart medicines to your face or genitals before you ask your health care provider if it is okay to do so.  Do not scratch or pick at a wart.  Wash your hands after you touch a wart.  Avoid shaving hair that is over a wart.  Keep all follow-up visits as told by your health care provider. This is important. Contact a health care provider if:  Your warts do not improve after treatment.  You have redness, swelling, or pain at the site of a wart.  You have bleeding from a wart that does not stop with light pressure.  You have diabetes and you develop a  wart. This information is not intended to replace advice given to you by your health care provider. Make sure you discuss any questions you have with your health care provider. Document Released: 09/04/2005 Document Revised: 05/08/2016 Document Reviewed: 02/20/2015 Elsevier Interactive Patient Education  2018 [employer] Inc[employer]  

## 2018-01-12 ENCOUNTER — Telehealth: Payer: Self-pay | Admitting: Endocrinology

## 2018-01-12 NOTE — Telephone Encounter (Signed)
This has been filled 

## 2018-01-12 NOTE — Telephone Encounter (Signed)
He is still taking this

## 2018-01-12 NOTE — Telephone Encounter (Signed)
Please advise if patient is still taking?

## 2018-01-28 ENCOUNTER — Ambulatory Visit (INDEPENDENT_AMBULATORY_CARE_PROVIDER_SITE_OTHER): Payer: Medicare Other | Admitting: *Deleted

## 2018-01-28 VITALS — BP 144/74 | HR 58 | Resp 18 | Ht 72.0 in | Wt 210.0 lb

## 2018-01-28 DIAGNOSIS — Z0000 Encounter for general adult medical examination without abnormal findings: Secondary | ICD-10-CM

## 2018-01-28 NOTE — Patient Instructions (Addendum)
Please ask eye doctor after your upcoming visit to send exam results to Dr. Ronnald Ramp. Fax# [phone].  Continue doing brain stimulating activities (puzzles, reading, adult coloring books, staying active) to keep memory sharp.   Continue to eat heart healthy diet (full of fruits, vegetables, whole grains, lean protein, water--limit salt, fat, and sugar intake) and increase physical activity as tolerated.   Alan Lopez , Thank you for taking time to come for your Medicare Wellness Visit. I appreciate your ongoing commitment to your health goals. Please review the following plan we discussed and let me know if I can assist you in the future.   These are the goals we discussed: Goals    . Patient Stated     Stay as healthy and as independent as possible. Continue to do yard work, socialize with friends, Scientist, water quality, enjoy life and family.       This is a list of the screening recommended for you and due dates:  Health Maintenance  Topic Date Due  .  Hepatitis C: One time screening is recommended by Center for Disease Control  (CDC) for  adults born from [birth year] through 1966.   [birth year]  . Eye exam for diabetics  01/28/2018  . Complete foot exam   01/29/2018  . Hemoglobin A1C  05/22/2018  . Cologuard (Stool DNA test)  03/24/2020  . Tetanus Vaccine  07/22/2023  . Flu Shot  Completed  . Pneumonia vaccines  Completed  \

## 2018-01-28 NOTE — Progress Notes (Addendum)
Subjective:   Alan Lopez is a 72 y.o. male who presents for an Initial Medicare Annual Wellness Visit.  Review of Systems  No ROS.  Medicare Wellness Visit. Additional risk factors are reflected in the social history.  Cardiac Risk Factors include: advanced age (>50mn, >>29women);diabetes mellitus;dyslipidemia;hypertension;male gender Sleep patterns:  gets up 1-2 times nightly to void and sleeps 6-7 hours nightly.    Home Safety/Smoke Alarms: Feels safe in home. Smoke alarms in place.  Living environment; residence and Firearm Safety: 2-story house, no firearms. Lives with wife, no needs for DME, good support system Seat Belt Safety/Bike Helmet: Wears seat belt.    Objective:    Today's Vitals   01/28/18 1316  BP: (!) 144/74  Pulse: (!) 58  Resp: 18  SpO2: 97%  Weight: 210 lb (95.3 kg)  Height: 6' (1.829 m)   Body mass index is 28.48 kg/m.  Advanced Directives 01/28/2018 05/16/2015  Does Patient Have a Medical Advance Directive? Yes Yes  Type [place] -  [place]? No - copy requested -    Current Medications (verified) Outpatient Encounter Medications as of 01/28/2018  Medication Sig  . aspirin EC 81 MG tablet Take 81 mg by mouth daily.  .Marland Kitchenatorvastatin (LIPITOR) 80 MG tablet TAKE 1 TABLET( 80 MG TOTAL) BY MOUTH DAILY  . cholecalciferol (VITAMIN D) 1000 UNITS tablet Take 1,000 Units by mouth daily.  . CONTOUR NEXT TEST test strip USE TO TEST BLOOD SUGAR 2-3 TIMES DAILY  . fexofenadine (ALLEGRA) 180 MG tablet Take 180 mg by mouth daily as needed for allergies (for seasonal allergies). Reported on 01/29/2016  . finasteride (PROSCAR) 5 MG tablet Take 5 mg by mouth daily.  . fluticasone (FLONASE) 50 MCG/ACT nasal spray Place 2 sprays into both nostrils daily as needed for allergies (for seasonal allergies). Reported on 01/29/2016  . Insulin Disposable Pump (V-GO 30) KIT Give 6 units  at breakfast, 8 units at lunch and 6- 8 units at dinner  . insulin lispro (HUMALOG) 100 UNIT/ML injection Use 68 units daily in the V-Go pump  . Insulin Pen Needle 32G X 4 MM MISC Use Three per day to inject insulin  . insulin regular (NOVOLIN R,HUMULIN R) 100 units/mL injection Inject 68 Units into the skin 3 (three) times daily before meals.  . Lancets (FREESTYLE) lancets Use to test blood sugar once daily Dx code E11.8  . losartan (COZAAR) 25 MG tablet Take 0.5 tablets (12.5 mg total) by mouth daily.  . metFORMIN (GLUCOPHAGE-XR) 500 MG 24 hr tablet TAKE 2 TABLETS BY MOUTH IN THE MORNING AND 1 TABLET IN THE EVENING  . metoprolol tartrate (LOPRESSOR) 25 MG tablet Take 0.5 tablets (12.5 mg total) by mouth 2 (two) times daily.  . pioglitazone (ACTOS) 15 MG tablet TAKE 1 TABLET(15 MG) BY MOUTH DAILY   No facility-administered encounter medications on file as of 01/28/2018.     Allergies (verified) Codeine   History: Past Medical History:  Diagnosis Date  . 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 HTN (hypertension)   . Hyperlipidemia   . Hypertrophy (benign) of prostate   . Multinodular goiter 04/12/2015  . Non-insulin dependent type 2 diabetes mellitus ([place]   . Status post non-ST elevation myocardial infarction (NSTEMI)    CATH  . Tobacco abuse   . Type II diabetes mellitus, uncontrolled ([place] 04/12/2015   Past Surgical History:  Procedure Laterality Date  . CORONARY  ANGIOPLASTY WITH STENT PLACEMENT     Family History  Problem Relation Age of Onset  . Diabetes Mother   . Diabetes Brother   . Cancer Brother   . Heart disease Neg Hx    Social History   Socioeconomic History  . Marital status: Married    Spouse name: None  . Number of children: 3  . Years of education: None  . Highest education level: None  Social Needs  . Financial resource strain: Not hard at all  . Food insecurity - worry: Never true  . Food insecurity - inability: Never true  . Transportation needs -  medical: No  . Transportation needs - non-medical: No  Occupational History  . None  Tobacco Use  . Smoking status: Light Tobacco Smoker    Types: Cigarettes    Last attempt to quit: 12/11/2015    Years since quitting: 2.1  . Smokeless tobacco: Never Used  Substance and Sexual Activity  . Alcohol use: Yes    Alcohol/week: 0.0 oz    Comment: socially  . Drug use: No  . Sexual activity: No  Other Topics Concern  . None  Social History Narrative  . None   Tobacco Counseling Ready to quit: Not Answered Counseling given: Not Answered  Activities of Daily Living In your present state of health, do you have any difficulty performing the following activities: 01/28/2018  Hearing? N  Vision? N  Difficulty concentrating or making decisions? N  Walking or climbing stairs? N  Dressing or bathing? N  Doing errands, shopping? N  Preparing Food and eating ? N  Using the Toilet? N  In the past six months, have you accidently leaked urine? Y  Comment followed by urology  Do you have problems with loss of bowel control? N  Managing your Medications? N  Managing your Finances? N  Housekeeping or managing your Housekeeping? N  Some recent data might be hidden     Immunizations and Health Maintenance Immunization History  Administered Date(s) Administered  . Influenza, High Dose Seasonal PF 09/12/2016, 11/08/2017  . Influenza-Unspecified 09/12/2014, 08/30/2015  . Pneumococcal Conjugate-13 09/13/2013  . Pneumococcal Polysaccharide-23 01/01/2018  . Tdap 07/21/2013  . Zoster 08/30/2015   Health Maintenance Due  Topic Date Due  . Hepatitis C Screening  November 20, 1946  . OPHTHALMOLOGY EXAM  01/28/2018    Patient Care Team: Janith Lima, MD as PCP - General (Internal Medicine) Elayne Snare, MD as Consulting Physician (Endocrinology) Melissa Noon, Bryce as Referring Physician (Optometry) Pilar Jarvis Horald Pollen, MD as Consulting Physician (Urology)  Indicate any recent Medical Services you  may have received from other than [facility] providers in the past year (date may be approximate).    Assessment:   This is a routine wellness examination for Calel. Physical assessment deferred to PCP.   Hearing/Vision screen Hearing Screening Comments: Able to hear conversational tones w/o difficulty. No issues reported. Passed whisper test Vision Screening Comments: appointment yearly  Blind in left eye.Dr. Maricela Curet  Dietary issues and exercise activities discussed: Current Exercise Habits: Home exercise routine, Type of exercise: stretching;walking(maintains multiple yards, cuts down trees), Time (Minutes): 40, Frequency (Times/Week): 4, Weekly Exercise (Minutes/Week): 160, Intensity: Mild, Exercise limited by: orthopedic condition(s) Diet (meal preparation, eat out, water intake, caffeinated beverages, dairy products, fruits and vegetables): in general, a "healthy" diet  , well balanced   Reviewed heart healthy and diabetic diet, encouraged patient to increase daily water intake.  Goals    . Patient Stated  Stay as healthy and as independent as possible. Continue to do yard work, socialize with friends, Scientist, water quality, enjoy life and family.      Depression Screen PHQ 2/9 Scores 01/28/2018 12/30/2017 04/26/2016 04/26/2016  PHQ - 2 Score 2 0 0 0  PHQ- 9 Score 3 - - -    Fall Risk Fall Risk  01/28/2018 12/30/2017 08/16/2016 05/16/2015  Falls in the past year? Yes Yes No No  Comment - - Emmi Telephone Survey: data to providers prior to load -  Number falls in past yr: 2 or more 2 or more - -  Injury with Fall? No - - -  Risk for fall due to : Impaired balance/gait - - -  Follow up Falls prevention discussed - - -   Cognitive Function: MMSE - Mini Mental State Exam 01/28/2018  Orientation to time 5  Orientation to Place 5  Registration 3  Attention/ Calculation 5  Recall 2  Language- name 2 objects 2  Language- repeat 1  Language- follow 3 step command 3  Language- read & follow  direction 1  Write a sentence 1  Copy design 1  Total score 29        Screening Tests Health Maintenance  Topic Date Due  . Hepatitis C Screening  Nov 08, 1946  . OPHTHALMOLOGY EXAM  01/28/2018  . FOOT EXAM  01/29/2018  . HEMOGLOBIN A1C  05/22/2018  . Fecal DNA (Cologuard)  03/24/2020  . TETANUS/TDAP  07/22/2023  . INFLUENZA VACCINE  Completed  . PNA vac Low Risk Adult  Completed      Plan:     Please ask eye doctor after your upcoming visit to send exam results to Dr. Ronnald Ramp. Fax# [phone].  Continue doing brain stimulating activities (puzzles, reading, adult coloring books, staying active) to keep memory sharp.   Continue to eat heart healthy diet (full of fruits, vegetables, whole grains, lean protein, water--limit salt, fat, and sugar intake) and increase physical activity as tolerated.  I have personally reviewed and noted the following in the patient's chart:   . Medical and social history . Use of alcohol, tobacco or illicit drugs  . Current medications and supplements . Functional ability and status . Nutritional status . Physical activity . Advanced directives . List of other physicians . Vitals . Screenings to include cognitive, depression, and falls . Referrals and appointments  In addition, I have reviewed and discussed with patient certain preventive protocols, quality metrics, and best practice recommendations. A written personalized care plan for preventive services as well as general preventive health recommendations were provided to patient.     Michiel Cowboy, RN   01/28/2018   Medical screening examination/treatment/procedure(s) were performed by non-physician practitioner and as supervising physician I was immediately available for consultation/collaboration. I agree with above. [name], MD

## 2018-01-30 DIAGNOSIS — E119 Type 2 diabetes mellitus without complications: Secondary | ICD-10-CM | POA: Diagnosis not present

## 2018-01-30 LAB — HM DIABETES EYE EXAM

## 2018-02-17 ENCOUNTER — Encounter: Payer: Self-pay | Admitting: Internal Medicine

## 2018-02-17 NOTE — Progress Notes (Signed)
Result abstracted and sent to scan ° °

## 2018-02-20 ENCOUNTER — Other Ambulatory Visit (INDEPENDENT_AMBULATORY_CARE_PROVIDER_SITE_OTHER): Payer: Medicare Other

## 2018-02-20 DIAGNOSIS — E1165 Type 2 diabetes mellitus with hyperglycemia: Secondary | ICD-10-CM

## 2018-02-20 DIAGNOSIS — Z794 Long term (current) use of insulin: Secondary | ICD-10-CM | POA: Diagnosis not present

## 2018-02-20 LAB — LIPID PANEL
CHOL/HDL RATIO: 2
Cholesterol: 107 mg/dL (ref 0–200)
HDL: 62.6 mg/dL (ref >39.00)
LDL Cholesterol: 28 mg/dL (ref 0–99)
NONHDL: 43.9
Triglycerides: 81 mg/dL (ref 0.0–149.0)
VLDL: 16.2 mg/dL (ref 0.0–40.0)

## 2018-02-20 LAB — COMPREHENSIVE METABOLIC PANEL
ALT: 44 U/L (ref 0–53)
AST: 29 U/L (ref 0–37)
Albumin: 3.8 g/dL (ref 3.5–5.2)
Alkaline Phosphatase: 59 U/L (ref 39–117)
BILIRUBIN TOTAL: 0.8 mg/dL (ref 0.2–1.2)
BUN: 17 mg/dL (ref 6–23)
CO2: 24 meq/L (ref 19–32)
Calcium: 9.1 mg/dL (ref 8.4–10.5)
Chloride: 109 mEq/L (ref 96–112)
Creatinine, Ser: 0.92 mg/dL (ref 0.40–1.50)
GFR: 85.7 mL/min (ref >60.00)
GLUCOSE: 167 mg/dL — AB (ref 70–99)
Potassium: 4.2 mEq/L (ref 3.5–5.1)
SODIUM: 139 meq/L (ref 135–145)
Total Protein: 6.2 g/dL (ref 6.0–8.3)

## 2018-02-20 LAB — HEMOGLOBIN A1C: Hgb A1c MFr Bld: 7.6 % — ABNORMAL HIGH (ref 4.6–6.5)

## 2018-02-24 ENCOUNTER — Other Ambulatory Visit: Payer: Self-pay

## 2018-02-24 ENCOUNTER — Ambulatory Visit (INDEPENDENT_AMBULATORY_CARE_PROVIDER_SITE_OTHER): Payer: Medicare Other | Admitting: Endocrinology

## 2018-02-24 ENCOUNTER — Encounter: Payer: Self-pay | Admitting: Endocrinology

## 2018-02-24 VITALS — BP 140/76 | HR 54 | Wt 209.0 lb

## 2018-02-24 DIAGNOSIS — Z794 Long term (current) use of insulin: Secondary | ICD-10-CM

## 2018-02-24 DIAGNOSIS — E1165 Type 2 diabetes mellitus with hyperglycemia: Secondary | ICD-10-CM

## 2018-02-24 MED ORDER — PIOGLITAZONE HCL 15 MG PO TABS: 15.0000 mg | ORAL_TABLET | Freq: Every day | ORAL | 1 refills | Status: DC

## 2018-02-24 NOTE — Patient Instructions (Addendum)
Check blood sugars on waking up  3/7  Also check blood sugars about 2 hours after a meal and do this after different meals by rotation  Recommended blood sugar levels on waking up is 90-130 and about 2 hours after meal is 130-160  Please bring your blood sugar monitor to each visit, thank you  More after supper

## 2018-02-24 NOTE — Progress Notes (Signed)
Patient ID: Alan Lopez, male   DOB: Dec 20, 1945, 72 y.o.   MRN: [id number]            Reason for Appointment: Follow-up for Type 2 Diabetes   History of Present Illness:          Date of diagnosis of type 2 diabetes mellitus :  1999      Past history:  He had modestly increase blood sugars at diagnosis and was started on metformin Subsequently glipizide was added to improve his control. He thinks that his blood sugars have been mostly poorly controlled over the last several years He had been on glipizide and metformin for years without any adjustment of his dosage of glipizide  Review of his A1c shows that it had ranged from 9-9.5 since 07/2013 Previously his PCP tried him on a sample of Jardiance 10 mg without significant improvement  He was started on the V-go pump on 07/02/17 because of inconsistent control and poor compliance with mealtime insulin  Recent history:   Oral hypoglycemic drugs the patient is taking are: metformin ER 500 mg, 2 tablets in the morning and 1 in the evening  Current INSULIN regimen: V-go pump SETTINGS: Basal rate = 30 units BOLUSES = 6 units at breakfast, 8 units at lunch and  5 units at dinner  His A1c is 7.6, Previous range 7.5-8.5  Current blood sugar patterns and problems identified:  He has been asked to check his blood sugars consistently after meals but he continues to forget doing this  Even though he thinks he is only eating usually 1 serving of carbohydrate at dinnertime he tends to have higher readings at night even with taking 10 units bolus  Has only one reading after supper of 184 and the other reading of 274 was after drinking a glass of milk  Again randomly he will have a low blood sugar at night but only 1 episode in the last month with a glucose of 66  FASTING blood sugars are somewhat variable but averaging about the same as before  He is generally trying to watch his portions and diet and his weight is stable  recently  Side effects from medications have been: Mild  diarrhea from regular metformin  Compliance with the medical regimen: fair   Hypoglycemia: never   Glucose monitoring:  done once a day usually       Glucometer:   Contour   Mean values apply above for all meters except median for One Touch  PRE-MEAL Fasting Lunch Dinner Bedtime Overall  Glucose range:  109-205      Mean/median:  156     160+/-45   POST-MEAL PC Breakfast PC Lunch PC Dinner  Glucose range:    184, 274  Mean/median:         Self-care: The diet that the patient has been following is: tries to limit high carbohydrate foods. eating out about 4 times a week     Typical meal intake: Breakfast is  cereal/meat .  Usually 2 servings of vegetables in the evening and avoiding potatoes and bread usually Usually avoiding drinks with sugar                Dietician visit, most recent:never CDE visit: 01/2016               Exercise: gardening, only a little walking  Weight history: Maximum weight 215 previously  Wt Readings from Last 3 Encounters:  02/24/18 209 lb (94.8 kg)  01/28/18  210 lb (95.3 kg)  01/01/18 208 lb 12 oz (94.7 kg)    Glycemic control: last A1c 9.2 done on 02/13/15 Microalbumin ratio normal in 3/16 from PCP    Lab Results  Component Value Date   HGBA1C 7.6 (H) 02/20/2018   HGBA1C 7.7 (H) 11/21/2017   HGBA1C 7.5 (H) 08/26/2017   Lab Results  Component Value Date   MICROALBUR <0.7 11/21/2017   LDLCALC 28 02/20/2018   CREATININE 0.92 02/20/2018    Lab on 02/20/2018  Component Date Value Ref Range Status  . Cholesterol 02/20/2018 107  0 - 200 mg/dL Final   ATP III Classification       Desirable:  < 200 mg/dL               Borderline High:  200 - 239 mg/dL          High:  > = 240 mg/dL  . Triglycerides 02/20/2018 81.0  0.0 - 149.0 mg/dL Final   Normal:  <150 mg/dLBorderline High:  150 - 199 mg/dL  . HDL 02/20/2018 62.60  >39.00 mg/dL Final  . VLDL 02/20/2018 16.2  0.0 - 40.0 mg/dL Final  .  LDL Cholesterol 02/20/2018 28  0 - 99 mg/dL Final  . Total CHOL/HDL Ratio 02/20/2018 2   Final                  Men          Women1/2 Average Risk     3.4          3.3Average Risk          5.0          4.42X Average Risk          9.6          7.13X Average Risk          15.0          11.0                      . NonHDL 02/20/2018 43.90   Final   NOTE:  Non-HDL goal should be 30 mg/dL higher than patient's LDL goal (i.e. LDL goal of < 70 mg/dL, would have non-HDL goal of < 100 mg/dL)  . Sodium 02/20/2018 139  135 - 145 mEq/L Final  . Potassium 02/20/2018 4.2  3.5 - 5.1 mEq/L Final  . Chloride 02/20/2018 109  96 - 112 mEq/L Final  . CO2 02/20/2018 24  19 - 32 mEq/L Final  . Glucose, Bld 02/20/2018 167* 70 - 99 mg/dL Final  . BUN 02/20/2018 17  6 - 23 mg/dL Final  . Creatinine, Ser 02/20/2018 0.92  0.40 - 1.50 mg/dL Final  . Total Bilirubin 02/20/2018 0.8  0.2 - 1.2 mg/dL Final  . Alkaline Phosphatase 02/20/2018 59  39 - 117 U/L Final  . AST 02/20/2018 29  0 - 37 U/L Final  . ALT 02/20/2018 44  0 - 53 U/L Final  . Total Protein 02/20/2018 6.2  6.0 - 8.3 g/dL Final  . Albumin 02/20/2018 3.8  3.5 - 5.2 g/dL Final  . Calcium 02/20/2018 9.1  8.4 - 10.5 mg/dL Final  . GFR 02/20/2018 85.70  >60.00 mL/min Final  . Hgb A1c MFr Bld 02/20/2018 7.6* 4.6 - 6.5 % Final   Glycemic Control Guidelines for People with Diabetes:Non Diabetic:  <6%Goal of Therapy: <7%Additional Action Suggested:  >8%       Allergies as of 02/24/2018  Reactions   Codeine Other (See Comments)   Patient doesn't like how it makes him feel, "spaced out, not together"      Medication List        Accurate as of 02/24/18 11:49 AM. Always use your most recent med list.          aspirin EC 81 MG tablet Take 81 mg by mouth daily.   atorvastatin 80 MG tablet Commonly known as:  LIPITOR TAKE 1 TABLET( 80 MG TOTAL) BY MOUTH DAILY   cholecalciferol 1000 units tablet Commonly known as:  VITAMIN D Take 1,000 Units by mouth  daily.   CONTOUR NEXT TEST test strip Generic drug:  glucose blood USE TO TEST BLOOD SUGAR 2-3 TIMES DAILY   fexofenadine 180 MG tablet Commonly known as:  ALLEGRA Take 180 mg by mouth daily as needed for allergies (for seasonal allergies). Reported on 01/29/2016   finasteride 5 MG tablet Commonly known as:  PROSCAR Take 5 mg by mouth daily.   fluticasone 50 MCG/ACT nasal spray Commonly known as:  FLONASE Place 2 sprays into both nostrils daily as needed for allergies (for seasonal allergies). Reported on 01/29/2016   freestyle lancets Use to test blood sugar once daily Dx code E11.8   insulin lispro 100 UNIT/ML injection Commonly known as:  HUMALOG Use 68 units daily in the V-Go pump   Insulin Pen Needle 32G X 4 MM Misc Use Three per day to inject insulin   insulin regular 100 units/mL injection Commonly known as:  NOVOLIN R,HUMULIN R Inject 68 Units into the skin 3 (three) times daily before meals.   losartan 25 MG tablet Commonly known as:  COZAAR Take 0.5 tablets (12.5 mg total) by mouth daily.   metFORMIN 500 MG 24 hr tablet Commonly known as:  GLUCOPHAGE-XR TAKE 2 TABLETS BY MOUTH IN THE MORNING AND 1 TABLET IN THE EVENING   metoprolol tartrate 25 MG tablet Commonly known as:  LOPRESSOR Take 0.5 tablets (12.5 mg total) by mouth 2 (two) times daily.   pioglitazone 15 MG tablet Commonly known as:  ACTOS Take 1 tablet (15 mg total) by mouth daily.   V-GO 30 Kit Give 6 units at breakfast, 8 units at lunch and 6- 8 units at dinner       Allergies:  Allergies  Allergen Reactions  . Codeine Other (See Comments)    Patient doesn't like how it makes him feel, "spaced out, not together"    Past Medical History:  Diagnosis Date  . 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 HTN (hypertension)   . Hyperlipidemia   . Hypertrophy (benign) of prostate   . Multinodular goiter 04/12/2015  . Non-insulin dependent type 2 diabetes mellitus ([place])   . Status post non-ST  elevation myocardial infarction (NSTEMI)    CATH  . Tobacco abuse   . Type II diabetes mellitus, uncontrolled ([place]) 04/12/2015    Past Surgical History:  Procedure Laterality Date  . CORONARY ANGIOPLASTY WITH STENT PLACEMENT      Family History  Problem Relation Age of Onset  . Diabetes Mother   . Diabetes Brother   . Cancer Brother   . Heart disease Neg Hx     Social History:  reports that he has been smoking cigarettes.  he has never used smokeless tobacco. He reports that he drinks alcohol. He reports that he does not use drugs.    Review of Systems     Lipid history:   Has history of CAD  Followed by cardiologist, since his MI he has been on 80 mg Lipitor Labs as follows:   Lab Results  Component Value Date   CHOL 107 02/20/2018   HDL 62.60 02/20/2018   LDLCALC 28 02/20/2018   TRIG 81.0 02/20/2018   CHOLHDL 2 02/20/2018           Eyes: He has had blindness in his left eye from old injury    THYROID: He was found to have a 2 cm nodule on his left thyroid on his exam but ultrasound showed multinodular goiter with mostly small nodules and the largest nodule on the left side of 1.4 cm has the appearance of a pseudo- nodule  Thyroid levels normal in the past  Lab Results  Component Value Date   TSH 1.18 01/29/2017   His last eye exam was in 01/2018   LABS:  Lab on 02/20/2018  Component Date Value Ref Range Status  . Cholesterol 02/20/2018 107  0 - 200 mg/dL Final   ATP III Classification       Desirable:  < 200 mg/dL               Borderline High:  200 - 239 mg/dL          High:  > = 240 mg/dL  . Triglycerides 02/20/2018 81.0  0.0 - 149.0 mg/dL Final   Normal:  <150 mg/dLBorderline High:  150 - 199 mg/dL  . HDL 02/20/2018 62.60  >39.00 mg/dL Final  . VLDL 02/20/2018 16.2  0.0 - 40.0 mg/dL Final  . LDL Cholesterol 02/20/2018 28  0 - 99 mg/dL Final  . Total CHOL/HDL Ratio 02/20/2018 2   Final                  Men          Women1/2 Average Risk     3.4           3.3Average Risk          5.0          4.42X Average Risk          9.6          7.13X Average Risk          15.0          11.0                      . NonHDL 02/20/2018 43.90   Final   NOTE:  Non-HDL goal should be 30 mg/dL higher than patient's LDL goal (i.e. LDL goal of < 70 mg/dL, would have non-HDL goal of < 100 mg/dL)  . Sodium 02/20/2018 139  135 - 145 mEq/L Final  . Potassium 02/20/2018 4.2  3.5 - 5.1 mEq/L Final  . Chloride 02/20/2018 109  96 - 112 mEq/L Final  . CO2 02/20/2018 24  19 - 32 mEq/L Final  . Glucose, Bld 02/20/2018 167* 70 - 99 mg/dL Final  . BUN 02/20/2018 17  6 - 23 mg/dL Final  . Creatinine, Ser 02/20/2018 0.92  0.40 - 1.50 mg/dL Final  . Total Bilirubin 02/20/2018 0.8  0.2 - 1.2 mg/dL Final  . Alkaline Phosphatase 02/20/2018 59  39 - 117 U/L Final  . AST 02/20/2018 29  0 - 37 U/L Final  . ALT 02/20/2018 44  0 - 53 U/L Final  . Total Protein 02/20/2018 6.2  6.0 - 8.3 g/dL Final  . Albumin 02/20/2018 3.8  3.5 -  5.2 g/dL Final  . Calcium 02/20/2018 9.1  8.4 - 10.5 mg/dL Final  . GFR 02/20/2018 85.70  >60.00 mL/min Final  . Hgb A1c MFr Bld 02/20/2018 7.6* 4.6 - 6.5 % Final   Glycemic Control Guidelines for People with Diabetes:Non Diabetic:  <6%Goal of Therapy: <7%Additional Action Suggested:  >8%     Physical Examination:  BP 140/76 (BP Location: Left Arm, Patient Position: Sitting, Cuff Size: Large)   Pulse (!) 54   Wt 209 lb (94.8 kg)   SpO2 96%   BMI 28.35 kg/m      ASSESSMENT:  Diabetes type 2, uncontrolled  See history of present illness for detailed discussion of his current management, blood sugar patterns and problems identified  His blood sugar control is about the same Still not able to get his A1c below 7%, now 7.6 His blood sugars are averaging about 150 again in the morning Because of his not checking his blood sugars after meals difficult to adjust his mealtime boluses Again discussed need to check readings in the late evening and not as  many in the morning to help adjust the mealtime boluses between 10-14 units based on his blood sugar patterns Discussed that doing fasting readings is not usually helpful in adjusting his insulin as his basal rate will stay the same To continue Actos  LIPIDS: Well controlled  PLAN:    As above he will try to adjust his dinnertime boluses based on his meal size and also try to watch his sugars after eating  He will continue to increase exercise  Follow-up in 3 months again    Patient Instructions  Check blood sugars on waking up  3/7  Also check blood sugars about 2 hours after a meal and do this after different meals by rotation  Recommended blood sugar levels on waking up is 90-130 and about 2 hours after meal is 130-160  Please bring your blood sugar monitor to each visit, thank you  More after supper      Elayne Snare 02/24/2018, 11:49 AM   Note: This office note was prepared with Dragon voice recognition system technology. Any transcriptional errors that result from this process are unintentional.

## 2018-02-24 NOTE — Telephone Encounter (Signed)
Omnipod approved from 02/06/2018 till 12/08/2018.

## 2018-02-25 ENCOUNTER — Telehealth: Payer: Self-pay

## 2018-02-25 NOTE — Telephone Encounter (Signed)
Omnipod has been approved from 02/06/2018 till 12/08/2018.

## 2018-04-01 ENCOUNTER — Other Ambulatory Visit: Payer: Self-pay | Admitting: Endocrinology

## 2018-06-02 ENCOUNTER — Other Ambulatory Visit (INDEPENDENT_AMBULATORY_CARE_PROVIDER_SITE_OTHER): Payer: Medicare Other

## 2018-06-02 DIAGNOSIS — Z794 Long term (current) use of insulin: Secondary | ICD-10-CM

## 2018-06-02 DIAGNOSIS — E1165 Type 2 diabetes mellitus with hyperglycemia: Secondary | ICD-10-CM | POA: Diagnosis not present

## 2018-06-02 LAB — BASIC METABOLIC PANEL
BUN: 18 mg/dL (ref 6–23)
CALCIUM: 8.9 mg/dL (ref 8.4–10.5)
CO2: 23 meq/L (ref 19–32)
Chloride: 107 mEq/L (ref 96–112)
Creatinine, Ser: 0.87 mg/dL (ref 0.40–1.50)
GFR: 91.34 mL/min (ref >60.00)
Glucose, Bld: 180 mg/dL — ABNORMAL HIGH (ref 70–99)
Potassium: 4.6 mEq/L (ref 3.5–5.1)
Sodium: 137 mEq/L (ref 135–145)

## 2018-06-02 LAB — HEMOGLOBIN A1C: Hgb A1c MFr Bld: 7.5 % — ABNORMAL HIGH (ref 4.6–6.5)

## 2018-06-04 ENCOUNTER — Ambulatory Visit (INDEPENDENT_AMBULATORY_CARE_PROVIDER_SITE_OTHER): Payer: Medicare Other | Admitting: Endocrinology

## 2018-06-04 ENCOUNTER — Encounter: Payer: Self-pay | Admitting: Endocrinology

## 2018-06-04 VITALS — BP 122/72 | HR 60 | Ht 72.0 in | Wt 208.8 lb

## 2018-06-04 DIAGNOSIS — Z794 Long term (current) use of insulin: Secondary | ICD-10-CM

## 2018-06-04 DIAGNOSIS — E1165 Type 2 diabetes mellitus with hyperglycemia: Secondary | ICD-10-CM | POA: Diagnosis not present

## 2018-06-04 NOTE — Patient Instructions (Signed)
Check blood sugars on waking up  3/7 days  Also check blood sugars about 2 hours after a meal and do this after different meals by rotation  Recommended blood sugar levels on waking up is 90-130 and about 2 hours after meal is 130-160  Please bring your blood sugar monitor to each visit, thank you   

## 2018-06-04 NOTE — Progress Notes (Signed)
Patient ID: Alan Lopez, male   DOB: 04-13-1946, 72 y.o.   MRN: [id number]            Reason for Appointment: Follow-up for Type 2 Diabetes   History of Present Illness:          Date of diagnosis of type 2 diabetes mellitus :  1999      Past history:  He had modestly increase blood sugars at diagnosis and was started on metformin Subsequently glipizide was added to improve his control. He thinks that his blood sugars have been mostly poorly controlled over the last several years He had been on glipizide and metformin for years without any adjustment of his dosage of glipizide  Review of his A1c shows that it had ranged from 9-9.5 since 07/2013 Previously his PCP tried him on a sample of Jardiance 10 mg without significant improvement  He was started on the V-go pump on 07/02/17 because of inconsistent control and poor compliance with mealtime insulin  Recent history:   Oral hypoglycemic drugs the patient is taking are: metformin ER 500 mg, 2 tablets in the morning and 1 in the evening  Current INSULIN regimen: V-go pump SETTINGS: Basal rate = 30 units BOLUSES = 6 units at breakfast, 8 units at lunch and  5-6 at dinner  His A1c is 7.6, Previous range 7.5-8.5  Current blood sugar patterns and problems identified:  He has checked his blood sugars somewhat infrequently and only 12 times in the last month  Also has only 2 readings after supper 1 of them being 229  However even though his fasting readings at home are averaging about 140 his blood sugar was 180 in the lab after just drinking coffee  He thinks he is adjusting his mealtime boluses based on how much carbohydrate he is eating but highest blood sugar was with eating spaghetti and bread and he did not take more than 10 years to cover this  He still has rare low blood sugars overnight her on 1-2 AM but only once in the last 3 months or so  He is changing his pump consistently at the same time  Side effects from  medications have been: Mild  diarrhea from regular metformin  Compliance with the medical regimen: fair   Hypoglycemia: never   Glucose monitoring:  done once a day usually       Glucometer:   Contour   Mean values apply above for all meters except median for One Touch  PRE-MEAL Fasting Lunch Dinner Bedtime Overall  Glucose range:  105-193      Mean/median:  143       POST-MEAL PC Breakfast PC Lunch PC Dinner  Glucose range:    176, 229  Mean/median:      PREVIOUS readings:  Mean values apply above for all meters except median for One Touch  PRE-MEAL Fasting Lunch Dinner Bedtime Overall  Glucose range:  109-205      Mean/median:  156     160+/-45   POST-MEAL PC Breakfast PC Lunch PC Dinner  Glucose range:    184, 274  Mean/median:         Self-care: The diet that the patient has been following is: tries to limit high carbohydrate foods. eating out about 4 times a week     Typical meal intake: Breakfast is  cereal/meat .  Usually 2 servings of vegetables in the evening and avoiding potatoes and bread usually Usually avoiding drinks with sugar  Dietician visit, most recent:never CDE visit: 01/2016               Exercise: gardening, only a little walking  Weight history: Maximum weight 215 previously  Wt Readings from Last 3 Encounters:  06/04/18 208 lb 12.8 oz (94.7 kg)  02/24/18 209 lb (94.8 kg)  01/28/18 210 lb (95.3 kg)    Glycemic control: last A1c 9.2 done on 02/13/15 Microalbumin ratio normal in 3/16 from PCP    Lab Results  Component Value Date   HGBA1C 7.5 (H) 06/02/2018   HGBA1C 7.6 (H) 02/20/2018   HGBA1C 7.7 (H) 11/21/2017   Lab Results  Component Value Date   MICROALBUR <0.7 11/21/2017   [place] 28 02/20/2018   CREATININE 0.87 06/02/2018    Lab on 06/02/2018  Component Date Value Ref Range Status  . Sodium 06/02/2018 137  135 - 145 mEq/L Final  . Potassium 06/02/2018 4.6  3.5 - 5.1 mEq/L Final  . Chloride 06/02/2018 107  96 -  112 mEq/L Final  . CO2 06/02/2018 23  19 - 32 mEq/L Final  . Glucose, Bld 06/02/2018 180* 70 - 99 mg/dL Final  . BUN 06/02/2018 18  6 - 23 mg/dL Final  . Creatinine, Ser 06/02/2018 0.87  0.40 - 1.50 mg/dL Final  . Calcium 06/02/2018 8.9  8.4 - 10.5 mg/dL Final  . GFR 06/02/2018 91.34  >60.00 mL/min Final  . Hgb A1c MFr Bld 06/02/2018 7.5* 4.6 - 6.5 % Final   Glycemic Control Guidelines for People with Diabetes:Non Diabetic:  <6%Goal of Therapy: <7%Additional Action Suggested:  >8%       Allergies as of 06/04/2018      Reactions   Codeine Other (See Comments)   Patient doesn't like how it makes him feel, "spaced out, not together"      Medication List        Accurate as of 06/04/18  3:24 PM. Always use your most recent med list.          aspirin EC 81 MG tablet Take 81 mg by mouth daily.   atorvastatin 80 MG tablet Commonly known as:  LIPITOR TAKE 1 TABLET( 80 MG TOTAL) BY MOUTH DAILY   cholecalciferol 1000 units tablet Commonly known as:  VITAMIN D Take 1,000 Units by mouth daily.   CONTOUR NEXT TEST test strip Generic drug:  glucose blood USE TO TEST BLOOD SUGAR 2-3 TIMES DAILY   fexofenadine 180 MG tablet Commonly known as:  ALLEGRA Take 180 mg by mouth daily as needed for allergies (for seasonal allergies). Reported on 01/29/2016   finasteride 5 MG tablet Commonly known as:  PROSCAR Take 5 mg by mouth daily.   fluticasone 50 MCG/ACT nasal spray Commonly known as:  FLONASE Place 2 sprays into both nostrils daily as needed for allergies (for seasonal allergies). Reported on 01/29/2016   freestyle lancets Use to test blood sugar once daily Dx code E11.8   Insulin Pen Needle 32G X 4 MM Misc Use Three per day to inject insulin   insulin regular 100 units/mL injection Commonly known as:  NOVOLIN R,HUMULIN R Inject 68 Units into the skin 3 (three) times daily before meals.   losartan 25 MG tablet Commonly known as:  COZAAR Take 0.5 tablets (12.5 mg total) by  mouth daily.   metFORMIN 500 MG 24 hr tablet Commonly known as:  GLUCOPHAGE-XR TAKE 2 TABLETS BY MOUTH IN THE MORNING AND 1 TABLET IN THE EVENING   metoprolol tartrate 25 MG tablet  Commonly known as:  LOPRESSOR Take 0.5 tablets (12.5 mg total) by mouth 2 (two) times daily.   pioglitazone 15 MG tablet Commonly known as:  ACTOS Take 1 tablet (15 mg total) by mouth daily.   V-GO 30 Kit Give 6 units at breakfast, 8 units at lunch and 6- 8 units at dinner       Allergies:  Allergies  Allergen Reactions  . Codeine Other (See Comments)    Patient doesn't like how it makes him feel, "spaced out, not together"    Past Medical History:  Diagnosis Date  . 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 HTN (hypertension)   . Hyperlipidemia   . Hypertrophy (benign) of prostate   . Multinodular goiter 04/12/2015  . Non-insulin dependent type 2 diabetes mellitus ([place])   . Status post non-ST elevation myocardial infarction (NSTEMI)    CATH  . Tobacco abuse   . Type II diabetes mellitus, uncontrolled ([place]) 04/12/2015    Past Surgical History:  Procedure Laterality Date  . CORONARY ANGIOPLASTY WITH STENT PLACEMENT      Family History  Problem Relation Age of Onset  . Diabetes Mother   . Diabetes Brother   . Cancer Brother   . Heart disease Neg Hx     Social History:  reports that he has been smoking cigarettes.  He has never used smokeless tobacco. He reports that he drinks alcohol. He reports that he does not use drugs.    Review of Systems     Lipid history:   Has history of CAD Followed by cardiologist, since his MI he has been on 80 mg Lipitor Labs as follows:   Lab Results  Component Value Date   CHOL 107 02/20/2018   HDL 62.60 02/20/2018   [place] 28 02/20/2018   TRIG 81.0 02/20/2018   CHOLHDL 2 02/20/2018           Eyes: He has had blindness in his left eye from old injury    THYROID: He was found to have a 2 cm nodule on his left thyroid on his exam but ultrasound  in the 04/2015 showed multinodular goiter with mostly small nodules and the largest nodule on the left side of 1.4 cm has the appearance of a pseudo- nodule  Thyroid levels normal in the past  Lab Results  Component Value Date   TSH 1.18 01/29/2017   His last eye exam was in 01/2018   LABS:  Lab on 06/02/2018  Component Date Value Ref Range Status  . Sodium 06/02/2018 137  135 - 145 mEq/L Final  . Potassium 06/02/2018 4.6  3.5 - 5.1 mEq/L Final  . Chloride 06/02/2018 107  96 - 112 mEq/L Final  . CO2 06/02/2018 23  19 - 32 mEq/L Final  . Glucose, Bld 06/02/2018 180* 70 - 99 mg/dL Final  . BUN 06/02/2018 18  6 - 23 mg/dL Final  . Creatinine, Ser 06/02/2018 0.87  0.40 - 1.50 mg/dL Final  . Calcium 06/02/2018 8.9  8.4 - 10.5 mg/dL Final  . GFR 06/02/2018 91.34  >60.00 mL/min Final  . Hgb A1c MFr Bld 06/02/2018 7.5* 4.6 - 6.5 % Final   Glycemic Control Guidelines for People with Diabetes:Non Diabetic:  <6%Goal of Therapy: <7%Additional Action Suggested:  >8%     Physical Examination:  BP 122/72 (BP Location: Left Arm, Patient Position: Sitting, Cuff Size: Normal)   Pulse 60   Ht 6' (1.829 m)   Wt 208 lb 12.8 oz (94.7 kg)  SpO2 97%   BMI 28.32 kg/m      ASSESSMENT:  Diabetes type 2, on insulin  See history of present illness for detailed discussion of his current management, blood sugar patterns and problems identified  His blood sugar control is about the same Still not able to get his A1c below 7%, now 7.5  Most likely he has high postprandial readings periodically but without his monitoring adequately difficult to make recommendations on his insulin doses for mealtimes Since fasting readings are not consistently high he does need to continue the same basal rate May be still benefiting from metformin and Actos which are working safely for him  He does not adjust his boluses consistently for various types of meals although he thinks he is watching his  carbohydrates   PLAN:    Discussed what types of foods he will need to make adjustments for with his boluses and for high glycemic index meals like spaghetti or prostate will need to take up to 14 units bolus  He does need to check readings after breakfast and lunch randomly to help adjust his boluses at this time  If he has a low carbohydrate high protein meal at suppertime he should have a bedtime snack to prevent overnight hypoglycemia  More blood sugar monitoring after meals and less in the morning  To call if he has any excessive hypoglycemia    Patient Instructions  Check blood sugars on waking up  3/7 days   Also check blood sugars about 2 hours after a meal and do this after different meals by rotation  Recommended blood sugar levels on waking up is 90-130 and about 2 hours after meal is 130-160  Please bring your blood sugar monitor to each visit, thank you       Elayne Snare 06/04/2018, 3:24 PM   Note: This office note was prepared with Dragon voice recognition system technology. Any transcriptional errors that result from this process are unintentional.

## 2018-08-01 ENCOUNTER — Other Ambulatory Visit: Payer: Self-pay | Admitting: Endocrinology

## 2018-08-27 ENCOUNTER — Encounter: Payer: Self-pay | Admitting: Cardiology

## 2018-09-01 ENCOUNTER — Other Ambulatory Visit: Payer: Medicare Other

## 2018-09-04 ENCOUNTER — Ambulatory Visit: Payer: Medicare Other | Admitting: Endocrinology

## 2018-09-04 DIAGNOSIS — N400 Enlarged prostate without lower urinary tract symptoms: Secondary | ICD-10-CM | POA: Diagnosis not present

## 2018-09-04 DIAGNOSIS — R972 Elevated prostate specific antigen [PSA]: Secondary | ICD-10-CM | POA: Diagnosis not present

## 2018-09-10 ENCOUNTER — Encounter: Payer: Self-pay | Admitting: Cardiology

## 2018-09-10 ENCOUNTER — Ambulatory Visit (INDEPENDENT_AMBULATORY_CARE_PROVIDER_SITE_OTHER): Payer: Medicare Other | Admitting: Cardiology

## 2018-09-10 VITALS — BP 112/74 | HR 54 | Ht 72.0 in | Wt 206.0 lb

## 2018-09-10 DIAGNOSIS — I2583 Coronary atherosclerosis due to lipid rich plaque: Secondary | ICD-10-CM | POA: Diagnosis not present

## 2018-09-10 DIAGNOSIS — I2510 Atherosclerotic heart disease of native coronary artery without angina pectoris: Secondary | ICD-10-CM

## 2018-09-10 DIAGNOSIS — R001 Bradycardia, unspecified: Secondary | ICD-10-CM | POA: Diagnosis not present

## 2018-09-10 DIAGNOSIS — I252 Old myocardial infarction: Secondary | ICD-10-CM | POA: Diagnosis not present

## 2018-09-10 DIAGNOSIS — D692 Other nonthrombocytopenic purpura: Secondary | ICD-10-CM | POA: Diagnosis not present

## 2018-09-10 DIAGNOSIS — Z9861 Coronary angioplasty status: Secondary | ICD-10-CM

## 2018-09-10 NOTE — Patient Instructions (Signed)
Medication Instructions:  Please hold your atorvastatin for 14 days.  Call after 2 weeks to let us know if you had improvement in your muscle pain. Continue all other medications as listed.  Follow-Up: Follow up in 1 year with Dr. Marlou Porch.  You will receive a letter in the mail 2 months before you are due.  Please call us when you receive this letter to schedule your follow up appointment.  Thank you for choosing [facility]!!     If you need a refill on your cardiac medications before your next appointment, please call your pharmacy.

## 2018-09-10 NOTE — Progress Notes (Signed)
Cardiology Office Note:    Date:  09/10/2018   ID:  Alan Lopez, DOB [birth year], MRN [id number]  PCP:  Alan Lima, MD  Cardiologist:  No primary care provider on file.  Electrophysiologist:  None   Referring MD: Alan Lima, MD     History of Present Illness:    Alan Lopez is a 72 y.o. male with CAD, DES to right coronary artery, prior MI, hypertension, smoker, hyperlipidemia with diabetes here for follow-up.  Previously described left-sided chest discomfort and was seen at [facility] by Dr. Alla Lopez.  He was having the symptoms and underwent catheterization on 12/12/2015 where he had severe disease of his right coronary artery and successful DES placement to this vessel.  He did have transient no reflow.  LVEDP was 16, EF was 45-50 with inferior wall hypokinesis.  He ended up having significant dyspnea on Brilinta, change to Plavix [name] is his neighbor.  His wife has ovarian/omental cancer.  He now has been having some full body muscle aches thinks it may be either his blood pressure pills or cholesterol medication and he was wondering if he could may be switch this.  He is currently taking atorvastatin 40 mg once a day.  Metoprolol and losartan as well.  His LDL was 28. He wonders as well if this is just old age.  He also states that he is having some trigger finger issues as well.  He has a lesion on his arm that he would like to have dermatology look at.  Denies any fevers chills nausea vomiting syncope bleeding.  Past Medical History:  Diagnosis Date  . 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 HTN (hypertension)   . Hyperlipidemia   . Hypertrophy (benign) of prostate   . Multinodular goiter 04/12/2015  . Non-insulin dependent type 2 diabetes mellitus ([place])   . Status post non-ST elevation myocardial infarction (NSTEMI)    CATH  . Tobacco abuse   . Type II diabetes mellitus, uncontrolled ([place]) 04/12/2015    Past Surgical  History:  Procedure Laterality Date  . CORONARY ANGIOPLASTY WITH STENT PLACEMENT      Current Medications: Current Meds  Medication Sig  . alfuzosin (UROXATRAL) 10 MG 24 hr tablet Take 10 mg by mouth daily with breakfast.   . aspirin EC 81 MG tablet Take 81 mg by mouth daily.  [place] Kitchen atorvastatin (LIPITOR) 80 MG tablet Take 80 mg by mouth daily. Take half a tablet in the morning.  . cholecalciferol (VITAMIN D) 1000 UNITS tablet Take 1,000 Units by mouth daily.  . CONTOUR NEXT TEST test strip USE TO TEST BLOOD SUGAR 2-3 TIMES DAILY  . fexofenadine (ALLEGRA) 180 MG tablet Take 180 mg by mouth daily as needed for allergies (for seasonal allergies). Reported on 01/29/2016  . finasteride (PROSCAR) 5 MG tablet Take 5 mg by mouth daily.  . fluticasone (FLONASE) 50 MCG/ACT nasal spray Place 2 sprays into both nostrils daily as needed for allergies (for seasonal allergies). Reported on 01/29/2016  . Insulin Disposable Pump (V-GO 30) KIT INJECT 6 UNITS AT BREAKFAST, 8 UNITS AT LUNCH, AND 6-8 UNITS AT DINNER  . Insulin Pen Needle 32G X 4 MM MISC Use Three per day to inject insulin  . insulin regular (NOVOLIN R,HUMULIN R) 100 units/mL injection Inject 68 Units into the skin 3 (three) times daily before meals.  . Lancets (FREESTYLE) lancets Use to test blood sugar once daily Dx code E11.8  . losartan (COZAAR) 25 MG  tablet Take 0.5 tablets (12.5 mg total) by mouth daily.  . metFORMIN (GLUCOPHAGE) 500 MG tablet Take 500 mg by mouth 2 (two) times daily with a meal. Take 2 tablets in the morning and 1 tablet in the evening.  . metoprolol tartrate (LOPRESSOR) 25 MG tablet Take 0.5 tablets (12.5 mg total) by mouth 2 (two) times daily.  . pioglitazone (ACTOS) 15 MG tablet Take 1 tablet (15 mg total) by mouth daily.     Allergies:   Codeine   Social History   Socioeconomic History  . Marital status: Married    Spouse name: Not on file  . Number of children: 3  . Years of education: Not on file  . Highest  education level: Not on file  Occupational History  . Not on file  Social Needs  . Financial resource strain: Not hard at all  . Food insecurity:    Worry: Never true    Inability: Never true  . Transportation needs:    Medical: No    Non-medical: No  Tobacco Use  . Smoking status: Light Tobacco Smoker    Types: Cigarettes    Last attempt to quit: 12/11/2015    Years since quitting: 2.7  . Smokeless tobacco: Never Used  Substance and Sexual Activity  . Alcohol use: Yes    Alcohol/week: 0.0 standard drinks    Comment: socially  . Drug use: No  . Sexual activity: Never  Lifestyle  . Physical activity:    Days per week: 4 days    Minutes per session: 50 min  . Stress: Rather much  Relationships  . Social connections:    Talks on phone: More than three times a week    Gets together: More than three times a week    Attends religious service: More than 4 times per year    Active member of club or organization: Not on file    Attends meetings of clubs or organizations: More than 4 times per year    Relationship status: Married  Other Topics Concern  . Not on file  Social History Narrative  . Not on file     Family History: The patient's family history includes Cancer in his brother; Diabetes in his brother and mother. There is no history of Heart disease.  ROS:   Please see the history of present illness.     All other systems reviewed and are negative.  EKGs/Labs/Other Studies Reviewed:    The following studies were reviewed today:  Cardiac catheterization 12/12/2015 at [facility] regional: - RCA 80 to 90% lesion, single DES placed.  Also had first diagonal ostial lesion of 40%.  LVEDP 16.  EF 45-50 with inferior wall hypokinesis.  EKG:  EKG is ordered today.  The ekg ordered today demonstrates 09/10/2018-sinus bradycardia rate 54 with no other abnormalities personally reviewed and interpreted prior sinus bradycardia 48 with no other abnormalities.  Previous  accelerated junctional rhythm with T wave inversions diffusely.  Recent Labs: 02/20/2018: ALT 44 06/02/2018: BUN 18; Creatinine, Ser 0.87; Potassium 4.6; Sodium 137  Recent Lipid Panel    Component Value Date/Time   CHOL 107 02/20/2018 0924   TRIG 81.0 02/20/2018 0924   HDL 62.60 02/20/2018 0924   CHOLHDL 2 02/20/2018 0924   VLDL 16.2 02/20/2018 0924   LDLCALC 28 02/20/2018 0924    Physical Exam:    VS:  BP 112/74   Pulse (!) 54   Ht 6' (1.829 m)   Wt 206 lb (93.4  kg)   SpO2 92%   BMI 27.94 kg/m     Wt Readings from Last 3 Encounters:  09/10/18 206 lb (93.4 kg)  06/04/18 208 lb 12.8 oz (94.7 kg)  02/24/18 209 lb (94.8 kg)     GEN:  Well nourished, well developed in no acute distress HEENT: Normal NECK: No JVD; No carotid bruits LYMPHATICS: No lymphadenopathy CARDIAC: Mildly bradycardic RRR, no murmurs, rubs, gallops RESPIRATORY:  Clear to auscultation without rales, wheezing or rhonchi  ABDOMEN: Soft, non-tender, non-distended MUSCULOSKELETAL:  No edema; No deformity  SKIN: Warm and dry, solar purpura noted, left forearm skin lesion crater-like noted. NEUROLOGIC:  Alert and oriented x 3 PSYCHIATRIC:  Normal affect   ASSESSMENT:    1. History of MI (myocardial infarction)   2. S/P PTCA (percutaneous transluminal coronary angioplasty)   3. Coronary artery disease due to lipid rich plaque   4. Bradycardia   5. Solar purpura ([place])    PLAN:    In order of problems listed above:  CAD post MI RCA stent - 12/12/2015 RCA DES with 45 to 50% basal inferior hypokinesis EF mild mitral regurgitation mild tricuspid regurgitation.  Initially with marked T wave inversion on ECG.  The subsequently resolved. - Try to avoid Brilinta in the future given dyspnea. - Doing very well.  Participated in cardiac rehabilitation, [employer], aspirin lifelong, aggressive secondary prevention.  Still taking care of his wife with ovarian cancer.  Scheduled visits or frequent. No need for  nitroglycerin at this point.  Hyperlipidemia/muscle fatigue - LDL previously 24, HDL 45, atorvastatin was decreased to 40 mg but continuing with high intensity statin.  Given some of his muscle issues, I will decrease back to 40.  Let us see how he does.  If he does feel better after a 14-day statin holiday, I will switch him over to Crestor 20 mg.  If not, continue with atorvastatin 40 mg.  He does admit that some of his body aches may be secondary to "old age "  Diabetes with hyperlipidemia - Dr. Dwyane Dee and Dr. Ronnald Ramp have been following.  Blindness in one eye is complication.  Bradycardia - Previously decreased his metoprolol.  Heart rate was 48. I will stop in future.  I do not want to stop both statin and beta-blocker simultaneously.  I asked him to call back.  Solar purpura - Bruising on forearms noted.  Aspirin only.  He is going to see a dermatologist about some of his lesions.  Trigger finger -He will discuss with Dr. Ronnald Ramp  He will call us back about results from statin holiday.  We will also contemplate discontinuing his beta-blocker at that time.  Medication Adjustments/Labs and Tests Ordered: Current medicines are reviewed at length with the patient today.  Concerns regarding medicines are outlined above.  Orders Placed This Encounter  Procedures  . EKG 12-Lead   No orders of the defined types were placed in this encounter.   Patient Instructions  Medication Instructions:  Please hold your atorvastatin for 14 days.  Call after 2 weeks to let us know if you had improvement in your muscle pain. Continue all other medications as listed.  Follow-Up: Follow up in 1 year with Dr. Marlou Porch.  You will receive a letter in the mail 2 months before you are due.  Please call us when you receive this letter to schedule your follow up appointment.  Thank you for choosing [facility]!!     If you need a refill on your  cardiac medications before your next appointment,  please call your pharmacy.      Signed, Candee Furbish, MD  09/10/2018 9:57 AM    [facility]

## 2018-09-15 ENCOUNTER — Other Ambulatory Visit (INDEPENDENT_AMBULATORY_CARE_PROVIDER_SITE_OTHER): Payer: Medicare Other

## 2018-09-15 DIAGNOSIS — E1165 Type 2 diabetes mellitus with hyperglycemia: Secondary | ICD-10-CM | POA: Diagnosis not present

## 2018-09-15 DIAGNOSIS — Z794 Long term (current) use of insulin: Secondary | ICD-10-CM | POA: Diagnosis not present

## 2018-09-15 LAB — BASIC METABOLIC PANEL
BUN: 17 mg/dL (ref 6–23)
CALCIUM: 9.1 mg/dL (ref 8.4–10.5)
CO2: 23 meq/L (ref 19–32)
CREATININE: 0.76 mg/dL (ref 0.40–1.50)
Chloride: 104 mEq/L (ref 96–112)
GFR: 106.68 mL/min (ref >60.00)
Glucose, Bld: 301 mg/dL — ABNORMAL HIGH (ref 70–99)
Potassium: 4.2 mEq/L (ref 3.5–5.1)
SODIUM: 134 meq/L — AB (ref 135–145)

## 2018-09-15 LAB — HEMOGLOBIN A1C: HEMOGLOBIN A1C: 8.9 % — AB (ref 4.6–6.5)

## 2018-09-15 LAB — MICROALBUMIN / CREATININE URINE RATIO
Creatinine,U: 151.3 mg/dL
MICROALB UR: 1.5 mg/dL (ref 0.0–1.9)
MICROALB/CREAT RATIO: 1 mg/g (ref 0.0–30.0)

## 2018-09-22 ENCOUNTER — Ambulatory Visit (INDEPENDENT_AMBULATORY_CARE_PROVIDER_SITE_OTHER): Payer: Medicare Other | Admitting: Internal Medicine

## 2018-09-22 ENCOUNTER — Encounter: Payer: Self-pay | Admitting: Internal Medicine

## 2018-09-22 VITALS — BP 120/86 | HR 58 | Ht 72.0 in | Wt 207.0 lb

## 2018-09-22 DIAGNOSIS — E1165 Type 2 diabetes mellitus with hyperglycemia: Secondary | ICD-10-CM

## 2018-09-22 DIAGNOSIS — Z23 Encounter for immunization: Secondary | ICD-10-CM

## 2018-09-22 DIAGNOSIS — I2510 Atherosclerotic heart disease of native coronary artery without angina pectoris: Secondary | ICD-10-CM | POA: Diagnosis not present

## 2018-09-22 DIAGNOSIS — I2583 Coronary atherosclerosis due to lipid rich plaque: Secondary | ICD-10-CM

## 2018-09-22 DIAGNOSIS — Z794 Long term (current) use of insulin: Secondary | ICD-10-CM | POA: Diagnosis not present

## 2018-09-22 LAB — GLUCOSE, POCT (MANUAL RESULT ENTRY): POC Glucose: 299 mg/dl — AB (ref 70–99)

## 2018-09-22 NOTE — Progress Notes (Signed)
Patient ID: Alan Lopez, male   DOB: [birth year], 72 y.o.   MRN: [id number]            Reason for Appointment: Follow-up for Type 2 Diabetes   History of Present Illness:          Date of diagnosis of type 2 diabetes mellitus :  1999      Past history:   Alan Lopez is a 72 year old Male who was diagnosed with T2DM in 1999, he was initially on oral glycemic agents (Metformin and Glipizide ). He was also tried on Jardiance with no improvement in glucose control.   In review of his A1c, it has ranged from 7.1 % in 2016 peaking at 9.4 % in 2017  He was started on the V-go pump on 07/02/17 because of inconsistent control and poor compliance with mealtime insulin  Recent history:   Oral hypoglycemic drugs the patient is taking are: metformin ER 500 mg, 2 tablets BID  Current INSULIN regimen: V-go pump SETTINGS: Basal rate = 30 units BOLUSES = 6 units at breakfast, 8 units at lunch and 12-14 at dinner    METER DOWNLOAD SUMMARY: date range 9/16-10/15/19 Fingerstick Blood Glucose Tests = 9 Average Number Tests/Day = 0.3 Overall Mean FS Glucose = 282 Standard Deviation = 32  BG Ranges: Low = 226 High = 320   Hypoglycemic Events/30 Days: BG < 50 = 0 Episodes of symptomatic severe hypoglycemia = 0     Self-care: The diet that the patient has been following is: tries to limit high carbohydrate foods. eating out about 4 times a week.  Usually avoiding drinks with sugar                                Lab Results  Component Value Date   HGBA1C 8.9 (H) 09/15/2018   HGBA1C 7.5 (H) 06/02/2018   HGBA1C 7.6 (H) 02/20/2018   Lab Results  Component Value Date   MICROALBUR 1.5 09/15/2018   LDLCALC 28 02/20/2018   CREATININE 0.76 09/15/2018    Office Visit on 09/22/2018  Component Date Value Ref Range Status  . POC Glucose 09/22/2018 299* 70 - 99 mg/dl Final      Allergies as of 09/22/2018      Reactions   Codeine Other (See Comments)   Patient doesn't like how it  makes him feel, "spaced out, not together"      Medication List        Accurate as of 09/22/18 10:40 AM. Always use your most recent med list.          alfuzosin 10 MG 24 hr tablet Commonly known as:  UROXATRAL Take 10 mg by mouth daily with breakfast.   aspirin EC 81 MG tablet Take 81 mg by mouth daily.   atorvastatin 80 MG tablet Commonly known as:  LIPITOR Take 80 mg by mouth daily. Take half a tablet in the morning.   cholecalciferol 1000 units tablet Commonly known as:  VITAMIN D Take 1,000 Units by mouth daily.   CONTOUR NEXT TEST test strip Generic drug:  glucose blood USE TO TEST BLOOD SUGAR 2-3 TIMES DAILY   fexofenadine 180 MG tablet Commonly known as:  ALLEGRA Take 180 mg by mouth daily as needed for allergies (for seasonal allergies). Reported on 01/29/2016   finasteride 5 MG tablet Commonly known as:  PROSCAR Take 5 mg by mouth daily.   fluticasone 50  MCG/ACT nasal spray Commonly known as:  FLONASE Place 2 sprays into both nostrils daily as needed for allergies (for seasonal allergies). Reported on 01/29/2016   freestyle lancets Use to test blood sugar once daily Dx code E11.8   Insulin Pen Needle 32G X 4 MM Misc Use Three per day to inject insulin   insulin regular 100 units/mL injection Commonly known as:  NOVOLIN R,HUMULIN R Inject 68 Units into the skin 3 (three) times daily before meals.   losartan 25 MG tablet Commonly known as:  COZAAR Take 0.5 tablets (12.5 mg total) by mouth daily.   metFORMIN 500 MG tablet Commonly known as:  GLUCOPHAGE Take 500 mg by mouth 2 (two) times daily with a meal. Take 2 tablets in the morning and 1 tablet in the evening.   metoprolol tartrate 25 MG tablet Commonly known as:  LOPRESSOR Take 0.5 tablets (12.5 mg total) by mouth 2 (two) times daily.   pioglitazone 15 MG tablet Commonly known as:  ACTOS Take 1 tablet (15 mg total) by mouth daily.   V-GO 30 Kit INJECT 6 UNITS AT BREAKFAST, 8 UNITS AT  LUNCH, AND 6-8 UNITS AT DINNER       Allergies:  Allergies  Allergen Reactions  . Codeine Other (See Comments)    Patient doesn't like how it makes him feel, "spaced out, not together"    Past Medical History:  Diagnosis Date  . 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 HTN (hypertension)   . Hyperlipidemia   . Hypertrophy (benign) of prostate   . Multinodular goiter 04/12/2015  . Non-insulin dependent type 2 diabetes mellitus ([place])   . Status post non-ST elevation myocardial infarction (NSTEMI)    CATH  . Tobacco abuse   . Type II diabetes mellitus, uncontrolled ([place]) 04/12/2015    Past Surgical History:  Procedure Laterality Date  . CORONARY ANGIOPLASTY WITH STENT PLACEMENT      Family History  Problem Relation Age of Onset  . Diabetes Mother   . Diabetes Brother   . Cancer Brother   . Heart disease Neg Hx     Social History:  reports that he has been smoking cigarettes. He has never used smokeless tobacco. He reports that he drinks alcohol. He reports that he does not use drugs.    Review of Systems    Review of Systems  Constitutional: Negative for malaise/fatigue and weight loss.  HENT: Negative for congestion and sore throat.   Eyes: Negative for pain.  Respiratory: Negative for cough and shortness of breath.   Cardiovascular: Negative for chest pain and palpitations.  Gastrointestinal: Positive for constipation. Negative for nausea.  Genitourinary: Positive for frequency. Negative for dysuria.  Musculoskeletal: Positive for joint pain.  Skin: Negative for rash.    Has history of CAD Followed by cardiologist, since his MI he has been on 80 mg Lipitor Labs as follows:           His last eye exam was in 01/2018   LABS:  Office Visit on 09/22/2018  Component Date Value Ref Range Status  . POC Glucose 09/22/2018 299* 70 - 99 mg/dl Final    Physical Examination:  BP 120/86 (BP Location: Right Arm, Patient Position: Sitting, Cuff Size: Normal)   Pulse (!)  58   Ht 6' (1.829 m)   Wt 93.9 kg   SpO2 94%   BMI 28.07 kg/m      ASSESSMENT/PLAN:  Diabetes type 2, on insulin via V-GO pump  - Patient  continues with hyperglycemia, patient attributes this to the incorrect type of insulin. Patient believes he picked up Novolin-N instead of Novolin-R from the pharmacy the last pick up. Denies hypoglycemia.  - He is doing better with using bolus insulin  - He continues to check glucose infrequently- Patient encouraged to check glucose before each meal and at bedtime. - Patient was advised to use Novolin-R and if his glucose remains > 200 mg/dL to increase the insulin as below :  Continue V-GO 30  Increase bolus insulin to : 8 units with breakfast, 12 units with lunch and continue 14 units with Supper.    F/U in 2 months.    Lucienne Capers Verlie Hellenbrand 09/22/2018, 10:40 AM

## 2018-09-22 NOTE — Patient Instructions (Addendum)
-   Please continue V-GO pump with use of NOVOLIN- R (Regular) -  Increase the bolus clicks to 4 clicks with Breakfast, 6 clicks with Lunch and continue 7 clicks with Supper. - Please check sugar at least twice a day (Fasting and at Bedtime)    - HOW TO TREAT LOW BLOOD SUGARS (Blood sugar LESS THAN 70 MG/DL)  Please follow the RULE OF 15 for the treatment of hypoglycemia treatment (when your (blood sugars are less than 70 mg/dL)    STEP 1: Take 15 grams of carbohydrates when your blood sugar is low, which includes:   3-4 GLUCOSE TABS  OR  3-4 OZ OF JUICE OR REGULAR SODA OR  ONE TUBE OF GLUCOSE GEL     STEP 2: RECHECK blood sugar in 15 MINUTES STEP 3: If your blood sugar is still low at the 15 minute recheck --> then, go back to STEP 1 and treat AGAIN with another 15 grams of carbohydrates.   - Please call the office with sugars that are less then 70 or over 300 mg/dL.

## 2018-10-05 ENCOUNTER — Telehealth: Payer: Self-pay | Admitting: Cardiology

## 2018-10-05 DIAGNOSIS — I2510 Atherosclerotic heart disease of native coronary artery without angina pectoris: Secondary | ICD-10-CM

## 2018-10-05 DIAGNOSIS — Z789 Other specified health status: Secondary | ICD-10-CM

## 2018-10-05 DIAGNOSIS — Z9861 Coronary angioplasty status: Secondary | ICD-10-CM

## 2018-10-05 DIAGNOSIS — I214 Non-ST elevation (NSTEMI) myocardial infarction: Secondary | ICD-10-CM

## 2018-10-05 DIAGNOSIS — I2583 Coronary atherosclerosis due to lipid rich plaque: Secondary | ICD-10-CM

## 2018-10-05 DIAGNOSIS — E785 Hyperlipidemia, unspecified: Secondary | ICD-10-CM

## 2018-10-05 NOTE — Telephone Encounter (Signed)
Patient came in with wife for her appointment. Patient wants to let Doctor know he is off of his Blood Pressure Medication and he does not want to be back on it. Patient wants another medication to replace it. Please call patient at mobile # listed.

## 2018-10-05 NOTE — Telephone Encounter (Signed)
Pt feels much better being off Atorvastatin for several weeks.  He is asking if there is a different medication he should be taking.  Advised I will review with Dr Marlou Porch and call back.

## 2018-10-06 ENCOUNTER — Other Ambulatory Visit: Payer: Self-pay | Admitting: Cardiology

## 2018-10-06 DIAGNOSIS — I214 Non-ST elevation (NSTEMI) myocardial infarction: Secondary | ICD-10-CM

## 2018-10-06 DIAGNOSIS — I2583 Coronary atherosclerosis due to lipid rich plaque: Secondary | ICD-10-CM

## 2018-10-06 DIAGNOSIS — Z9861 Coronary angioplasty status: Secondary | ICD-10-CM

## 2018-10-06 DIAGNOSIS — I2510 Atherosclerotic heart disease of native coronary artery without angina pectoris: Secondary | ICD-10-CM

## 2018-10-06 DIAGNOSIS — Z789 Other specified health status: Secondary | ICD-10-CM

## 2018-10-06 DIAGNOSIS — E785 Hyperlipidemia, unspecified: Secondary | ICD-10-CM

## 2018-10-06 MED ORDER — ROSUVASTATIN CALCIUM 20 MG PO TABS: 20.0000 mg | ORAL_TABLET | Freq: Every day | ORAL | 0 refills | Status: DC

## 2018-10-06 NOTE — Telephone Encounter (Signed)
Let's start Crestor 20mg  PO QD and have him set up for lipid clinic evaluation.  Thanks Candee Furbish, MD

## 2018-10-06 NOTE — Telephone Encounter (Signed)
Spoke with the pt and endorsed recommendations per Dr Marlou Porch, for him to stop atorvastatin, and start Crestor (rosuvastatin) 20 mg po daily, and we will refer him to our lipid clinic for further evaluation.  Confirmed the pharmacy of choice with the pt.  Pt only requesting a month supply with no refills be sent to his pharmacy, to make sure he tolerates this appropriately.  Pt states if he does well on this med, he will call our office to have  90 day supply sent to his pharmacy.  Informed the pt that I will place the referral to our lipid clinic in the system and have a [facility] scheduler call him back to make this appt.  Pt verbalized understanding and agrees with this plan.

## 2018-10-07 NOTE — Telephone Encounter (Signed)
Pts lipid clinic appt is for 10/22/18 at 0930.  Pt made aware of appt date and time by [facility] scheduling.

## 2018-10-22 ENCOUNTER — Ambulatory Visit (INDEPENDENT_AMBULATORY_CARE_PROVIDER_SITE_OTHER): Payer: Medicare Other | Admitting: Pharmacist

## 2018-10-22 DIAGNOSIS — Z9861 Coronary angioplasty status: Secondary | ICD-10-CM | POA: Diagnosis not present

## 2018-10-22 DIAGNOSIS — E785 Hyperlipidemia, unspecified: Secondary | ICD-10-CM

## 2018-10-22 DIAGNOSIS — Z789 Other specified health status: Secondary | ICD-10-CM | POA: Diagnosis not present

## 2018-10-22 DIAGNOSIS — I2583 Coronary atherosclerosis due to lipid rich plaque: Secondary | ICD-10-CM | POA: Diagnosis not present

## 2018-10-22 DIAGNOSIS — I214 Non-ST elevation (NSTEMI) myocardial infarction: Secondary | ICD-10-CM

## 2018-10-22 DIAGNOSIS — I2510 Atherosclerotic heart disease of native coronary artery without angina pectoris: Secondary | ICD-10-CM

## 2018-10-22 MED ORDER — ROSUVASTATIN CALCIUM 20 MG PO TABS: ORAL_TABLET | ORAL | 3 refills | Status: DC

## 2018-10-22 NOTE — Patient Instructions (Addendum)
You can decrease your rosuvastatin to 10mg  daily (cut your 20mg  tablet in half  You can try Coenzyme Q 10 (COQ10) 200mg  daily over the counter to see if this helps you tolerate your rosuvastatin better  We will recheck your cholesterol in 3 months. Come in any time after 7:30am for fasting lab work on Monday February 10th.

## 2018-10-22 NOTE — Progress Notes (Signed)
Patient ID: Alan Lopez                 DOB: August 07, 1946                    MRN: [id number]     HPI: Alan Lopez is a 72 y.o. male patient referred to lipid clinic by Dr Marlou Porch. PMH is significant for CAD with 80-90% RCA lesion s/p DES, prior MI, HTN, tobacco abuse, HLD, and DM. He was switched from atorvastatin to rosuvastatin on 10/05/18 due to myalgias on atorvastatin and presents today for follow up.  Pt reports tolerating atorvastatin for about a year before developing myalgias. He has been feeling better on rosuvastatin (myalgias in his legs are much improved), however he still has some joint pain in his hips and his fingers. He is not sure if this is from arthritis or his medication.  Current Medications: rosuvastatin 14m daily Intolerances: atorvastatin 417mdaily - myalgias Risk Factors: CAD s/p PCI, MI, DM, HTN, age LDL goal: <7011mL  Diet: Oatmeal with blueberries, hamburgers, chicken and veggies, soup. Does not cook much.  Exercise: Gardening, yard work, some walking but hip pain limits this. Plans to use Silver Sneakers  Family History: The patient's family history includes Cancer in his brother; Diabetes in his brother and mother. There is no history of Heart disease.  Social History: Light tobacco abuse, social alcohol use, denies illicit drug use.  Labs: 02/20/18: TC 107, TG 81, HDL 62.6, LDL 28 (atorvastatin 30m1mily)  Past Medical History:  Diagnosis Date  . 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 HTN (hypertension)   . Hyperlipidemia   . Hypertrophy (benign) of prostate   . Multinodular goiter 04/12/2015  . Non-insulin dependent type 2 diabetes mellitus ([facility])[place]. Status post non-ST elevation myocardial infarction (NSTEMI)    CATH  . Tobacco abuse   . Type II diabetes mellitus, uncontrolled (HCC)[place]4/2016    Current Outpatient Medications on File Prior to Visit  Medication Sig Dispense Refill  . alfuzosin (UROXATRAL) 10 MG 24 hr tablet Take 10 mg by  mouth daily with breakfast.   11  . aspirin EC 81 MG tablet Take 81 mg by mouth daily.    . cholecalciferol (VITAMIN D) 1000 UNITS tablet Take 1,000 Units by mouth daily.    . CONTOUR NEXT TEST test strip USE TO TEST BLOOD SUGAR 2-3 TIMES DAILY 200 each 2  . fexofenadine (ALLEGRA) 180 MG tablet Take 180 mg by mouth daily as needed for allergies (for seasonal allergies). Reported on 01/29/2016    . finasteride (PROSCAR) 5 MG tablet Take 5 mg by mouth daily.  3  . fluticasone (FLONASE) 50 MCG/ACT nasal spray Place 2 sprays into both nostrils daily as needed for allergies (for seasonal allergies). Reported on 01/29/2016    . Insulin Disposable Pump (V-GO 30) KIT INJECT 6 UNITS AT BREAKFAST, 8 UNITS AT LUNCH, AND 6-8 UNITS AT DINNER 90 kit 0  . Insulin Pen Needle 32G X 4 MM MISC Use Three per day to inject insulin 100 each 5  . insulin regular (NOVOLIN R,HUMULIN R) 100 units/mL injection Inject 68 Units into the skin 3 (three) times daily before meals.    . Lancets (FREESTYLE) lancets Use to test blood sugar once daily Dx code E11.8 100 each 12  . losartan (COZAAR) 25 MG tablet Take 0.5 tablets (12.5 mg total) by mouth daily. 45 tablet 3  . metFORMIN (GLUCOPHAGE) 500 MG tablet Take 500  mg by mouth 2 (two) times daily with a meal. Take 2 tablets in the morning and 1 tablet in the evening.    . metoprolol tartrate (LOPRESSOR) 25 MG tablet Take 0.5 tablets (12.5 mg total) by mouth 2 (two) times daily. 90 tablet 3  . pioglitazone (ACTOS) 15 MG tablet Take 1 tablet (15 mg total) by mouth daily. 90 tablet 1  . rosuvastatin (CRESTOR) 20 MG tablet Take 1 tablet (20 mg total) by mouth daily. 30 tablet 0   No current facility-administered medications on file prior to visit.     Allergies  Allergen Reactions  . Atorvastatin Other (See Comments)  . Codeine Other (See Comments)    Patient doesn't like how it makes him feel, "spaced out, not together"    Assessment/Plan:  1. Hyperlipidemia - Will decrease  rosuvastatin to 66m daily to see if this improves patient's joint pain. Advised him he can try CoQ10 2079mdaily OTC as well. Will recheck lipids in 3 months, anticipate that LDL will remain at goal < 70 as LDL was 28 on atorvastatin 4044maily.   Megan E. Supple, PharmD, BCACP, [id number] South LanereBowling GreenC [id number]: [phone]: ([phone]/14/2019 10:39 AM

## 2018-10-23 ENCOUNTER — Other Ambulatory Visit: Payer: Self-pay

## 2018-10-23 MED ORDER — LOSARTAN POTASSIUM 25 MG PO TABS: 12.5000 mg | ORAL_TABLET | Freq: Every day | ORAL | 3 refills | Status: DC

## 2018-10-26 ENCOUNTER — Other Ambulatory Visit: Payer: Self-pay

## 2018-10-26 MED ORDER — GLUCOSE BLOOD VI STRP: ORAL_STRIP | 2 refills | Status: DC

## 2018-11-04 ENCOUNTER — Other Ambulatory Visit: Payer: Self-pay | Admitting: Endocrinology

## 2018-11-13 ENCOUNTER — Other Ambulatory Visit: Payer: Self-pay

## 2018-11-13 MED ORDER — METFORMIN HCL 500 MG PO TABS: 500.0000 mg | ORAL_TABLET | Freq: Two times a day (BID) | ORAL | 0 refills | Status: DC

## 2018-11-13 MED ORDER — V-GO 30 KIT: PACK | 2 refills | Status: DC

## 2018-11-13 MED ORDER — PIOGLITAZONE HCL 15 MG PO TABS: 15.0000 mg | ORAL_TABLET | Freq: Every day | ORAL | 0 refills | Status: DC

## 2018-11-16 ENCOUNTER — Other Ambulatory Visit: Payer: Self-pay | Admitting: Cardiology

## 2018-11-16 DIAGNOSIS — Z789 Other specified health status: Secondary | ICD-10-CM

## 2018-11-16 DIAGNOSIS — I214 Non-ST elevation (NSTEMI) myocardial infarction: Secondary | ICD-10-CM

## 2018-11-16 DIAGNOSIS — I2583 Coronary atherosclerosis due to lipid rich plaque: Secondary | ICD-10-CM

## 2018-11-16 DIAGNOSIS — I2510 Atherosclerotic heart disease of native coronary artery without angina pectoris: Secondary | ICD-10-CM

## 2018-11-16 DIAGNOSIS — Z9861 Coronary angioplasty status: Secondary | ICD-10-CM

## 2018-11-16 DIAGNOSIS — E785 Hyperlipidemia, unspecified: Secondary | ICD-10-CM

## 2018-11-16 MED ORDER — METOPROLOL TARTRATE 25 MG PO TABS: 12.5000 mg | ORAL_TABLET | Freq: Two times a day (BID) | ORAL | 2 refills | Status: DC

## 2018-11-16 MED ORDER — ROSUVASTATIN CALCIUM 20 MG PO TABS: ORAL_TABLET | ORAL | 2 refills | Status: DC

## 2018-11-16 NOTE — Telephone Encounter (Signed)
Pt's medications were sent to pt's pharmacy as requested. Confirmation received.  

## 2018-12-14 ENCOUNTER — Other Ambulatory Visit: Payer: Self-pay | Admitting: Endocrinology

## 2018-12-14 DIAGNOSIS — Z794 Long term (current) use of insulin: Principal | ICD-10-CM

## 2018-12-14 DIAGNOSIS — E1165 Type 2 diabetes mellitus with hyperglycemia: Secondary | ICD-10-CM

## 2018-12-15 ENCOUNTER — Other Ambulatory Visit (INDEPENDENT_AMBULATORY_CARE_PROVIDER_SITE_OTHER): Payer: Medicare Other

## 2018-12-15 DIAGNOSIS — Z794 Long term (current) use of insulin: Secondary | ICD-10-CM | POA: Diagnosis not present

## 2018-12-15 DIAGNOSIS — E1165 Type 2 diabetes mellitus with hyperglycemia: Secondary | ICD-10-CM | POA: Diagnosis not present

## 2018-12-15 LAB — BASIC METABOLIC PANEL
BUN: 16 mg/dL (ref 6–23)
CO2: 22 mEq/L (ref 19–32)
Calcium: 9.1 mg/dL (ref 8.4–10.5)
Chloride: 108 mEq/L (ref 96–112)
Creatinine, Ser: 0.88 mg/dL (ref 0.40–1.50)
GFR: 90.01 mL/min (ref >60.00)
Glucose, Bld: 172 mg/dL — ABNORMAL HIGH (ref 70–99)
Potassium: 3.9 mEq/L (ref 3.5–5.1)
Sodium: 137 mEq/L (ref 135–145)

## 2018-12-15 LAB — HEMOGLOBIN A1C: Hgb A1c MFr Bld: 6.8 % — ABNORMAL HIGH (ref 4.6–6.5)

## 2018-12-18 ENCOUNTER — Encounter: Payer: Self-pay | Admitting: Endocrinology

## 2018-12-18 ENCOUNTER — Ambulatory Visit (INDEPENDENT_AMBULATORY_CARE_PROVIDER_SITE_OTHER): Payer: Medicare Other | Admitting: Endocrinology

## 2018-12-18 VITALS — BP 128/76 | HR 57 | Ht 72.0 in | Wt 205.8 lb

## 2018-12-18 DIAGNOSIS — E1165 Type 2 diabetes mellitus with hyperglycemia: Secondary | ICD-10-CM | POA: Diagnosis not present

## 2018-12-18 DIAGNOSIS — Z794 Long term (current) use of insulin: Secondary | ICD-10-CM

## 2018-12-18 MED ORDER — METFORMIN HCL ER 500 MG PO TB24: 500.0000 mg | ORAL_TABLET | Freq: Two times a day (BID) | ORAL | 3 refills | Status: DC

## 2018-12-18 NOTE — Progress Notes (Signed)
Patient ID: Alan Lopez, male   DOB: 11-02-1946, 73 y.o.   MRN: [id number]            Reason for Appointment: Follow-up for Type 2 Diabetes   History of Present Illness:          Date of diagnosis of type 2 diabetes mellitus :  1999      Past history:  He had modestly increase blood sugars at diagnosis and was started on metformin Subsequently glipizide was added to improve his control. He thinks that his blood sugars have been mostly poorly controlled over the last several years He had been on glipizide and metformin for years without any adjustment of his dosage of glipizide  Review of his A1c shows that it had ranged from 9-9.5 since 07/2013 Previously his PCP tried him on a sample of Jardiance 10 mg without significant improvement  He was started on the V-go pump on 07/02/17 because of inconsistent control and poor compliance with mealtime insulin  Recent history:   Oral hypoglycemic drugs the patient is taking are: Actos 15 mg, metformin ER 500 mg, 2 tablets in the morning and 1 in the evening  Current INSULIN regimen: V-go pump SETTINGS: Basal rate = 30 units BOLUSES = 6 units at breakfast, 8-10 units at lunch and  10 at dinner  His A1c is 6.8, has been as high as 8.9  Current blood sugar patterns and problems identified:  He forgot to bring his monitor for download today  Although previously has had inconsistent control his glucose readings overall are better; this may be from somewhat less stress and able to manage his lifestyle better  By mistake he was using NPH insulin in his pump in late 2019 and was told to switch to regular insulin in October  Although he thinks his blood sugars are mostly around 130+ Mark his fasting glucose in the lab was 176  However he thinks he still may get rare hypoglycemia about once or twice every month late at night if he is not eating as many carbohydrates needing  POSTPRANDIAL readings are difficult to assess normal but he  thinks they are usually 150-160  He thinks he is usually trying to adjust his boluses based on how much carbohydrate is getting including still relatively higher amounts at lunch  He is now taking regular metformin that was sent in instead of extended release but he still has tendency to having diarrhea but appears to be more significant in the mornings recently  No edema or weight gain with Actos 15 mg  He is changing his pump consistently at the same time in the morning usually  Side effects from medications have been: Mild  diarrhea from regular metformin  Compliance with the medical regimen: fair   Hypoglycemia: never   Glucose monitoring:  done once a day usually       Glucometer:   Contour   Readings as above    Self-care: The diet that the patient has been following is: tries to limit high carbohydrate foods. eating out about 4 times a week     Typical meal intake: Breakfast is  cereal/meat .  Usually 2 servings of vegetables in the evening and avoiding potatoes and bread usually Usually avoiding drinks with sugar                Dietician visit, most recent:never CDE visit: 01/2016               Exercise: A  little walking, housework  Weight history: Maximum weight 215 previously  Wt Readings from Last 3 Encounters:  12/18/18 205 lb 12.8 oz (93.4 kg)  09/22/18 207 lb (93.9 kg)  09/10/18 206 lb (93.4 kg)    Glycemic control:    Lab Results  Component Value Date   HGBA1C 6.8 (H) 12/15/2018   HGBA1C 8.9 (H) 09/15/2018   HGBA1C 7.5 (H) 06/02/2018   Lab Results  Component Value Date   MICROALBUR 1.5 09/15/2018   LDLCALC 28 02/20/2018   CREATININE 0.88 12/15/2018    Lab on 12/15/2018  Component Date Value Ref Range Status  . Sodium 12/15/2018 137  135 - 145 mEq/L Final  . Potassium 12/15/2018 3.9  3.5 - 5.1 mEq/L Final  . Chloride 12/15/2018 108  96 - 112 mEq/L Final  . CO2 12/15/2018 22  19 - 32 mEq/L Final  . Glucose, Bld 12/15/2018 172* 70 - 99 mg/dL Final    . BUN 12/15/2018 16  6 - 23 mg/dL Final  . Creatinine, Ser 12/15/2018 0.88  0.40 - 1.50 mg/dL Final  . Calcium 12/15/2018 9.1  8.4 - 10.5 mg/dL Final  . GFR 12/15/2018 90.01  >60.00 mL/min Final  . Hgb A1c MFr Bld 12/15/2018 6.8* 4.6 - 6.5 % Final   Glycemic Control Guidelines for People with Diabetes:Non Diabetic:  <6%Goal of Therapy: <7%Additional Action Suggested:  >8%       Allergies as of 12/18/2018      Reactions   Atorvastatin Other (See Comments)   Codeine Other (See Comments)   Patient doesn't like how it makes him feel, "spaced out, not together"      Medication List       Accurate as of December 18, 2018  2:30 PM. Always use your most recent med list.        alfuzosin 10 MG 24 hr tablet Commonly known as:  UROXATRAL Take 10 mg by mouth daily with breakfast.   aspirin EC 81 MG tablet Take 81 mg by mouth daily.   cholecalciferol 1000 units tablet Commonly known as:  VITAMIN D Take 1,000 Units by mouth daily.   fexofenadine 180 MG tablet Commonly known as:  ALLEGRA Take 180 mg by mouth daily as needed for allergies (for seasonal allergies). Reported on 01/29/2016   finasteride 5 MG tablet Commonly known as:  PROSCAR Take 5 mg by mouth daily.   fluticasone 50 MCG/ACT nasal spray Commonly known as:  FLONASE Place 2 sprays into both nostrils daily as needed for allergies (for seasonal allergies). Reported on 01/29/2016   freestyle lancets Use to test blood sugar once daily Dx code E11.8   glucose blood test strip Commonly known as:  CONTOUR NEXT TEST USE TO TEST BLOOD SUGAR 2-3 TIMES DAILY   Insulin Pen Needle 32G X 4 MM Misc Use Three per day to inject insulin   insulin regular 100 units/mL injection Commonly known as:  NOVOLIN R,HUMULIN R Inject into the skin 3 (three) times daily before meals. USE INSULIN TO FILL UP v-GO PUMP   losartan 25 MG tablet Commonly known as:  COZAAR Take 0.5 tablets (12.5 mg total) by mouth daily.   metFORMIN 500 MG  tablet Commonly known as:  GLUCOPHAGE Take 1 tablet (500 mg total) by mouth 2 (two) times daily with a meal. Take 2 tablets in the morning and 1 tablet in the evening.   metoprolol tartrate 25 MG tablet Commonly known as:  LOPRESSOR Take 0.5 tablets (12.5 mg total) by mouth  2 (two) times daily.   pioglitazone 15 MG tablet Commonly known as:  ACTOS Take 1 tablet (15 mg total) by mouth daily.   rosuvastatin 20 MG tablet Commonly known as:  CRESTOR Take 1/2 to 1 tablet by mouth daily as tolerated.   V-GO 30 Kit INJECT 6 UNITS AT BREAKFAST, 8 UNITS AT LUNCH, AND 6-8 UNITS AT DINNER       Allergies:  Allergies  Allergen Reactions  . Atorvastatin Other (See Comments)  . Codeine Other (See Comments)    Patient doesn't like how it makes him feel, "spaced out, not together"    Past Medical History:  Diagnosis Date  . 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 HTN (hypertension)   . Hyperlipidemia   . Hypertrophy (benign) of prostate   . Multinodular goiter 04/12/2015  . Non-insulin dependent type 2 diabetes mellitus ([place])   . Status post non-ST elevation myocardial infarction (NSTEMI)    CATH  . Tobacco abuse   . Type II diabetes mellitus, uncontrolled ([place]) 04/12/2015    Past Surgical History:  Procedure Laterality Date  . CORONARY ANGIOPLASTY WITH STENT PLACEMENT      Family History  Problem Relation Age of Onset  . Diabetes Mother   . Diabetes Brother   . Cancer Brother   . Heart disease Neg Hx     Social History:  reports that he has been smoking cigarettes. He has never used smokeless tobacco. He reports current alcohol use. He reports that he does not use drugs.    Review of Systems     Lipid history:   Has history of CAD Followed by cardiologist, since his MI he has been on 80 mg Lipitor Labs as follows:   Lab Results  Component Value Date   CHOL 107 02/20/2018   HDL 62.60 02/20/2018   LDLCALC 28 02/20/2018   TRIG 81.0 02/20/2018   CHOLHDL 2 02/20/2018            Eyes: He has had blindness in his left eye from old injury    THYROID: Previously was found to have a 2 cm nodule on his left thyroid on his exam but ultrasound in 04/2015 showed multinodular goiter with mostly small nodules and the largest nodule on the left side of 1.4 cm has the appearance of a pseudo- nodule  Thyroid levels normal in the past  Lab Results  Component Value Date   TSH 1.18 01/29/2017   His last eye exam was in 01/2018   LABS:  Lab on 12/15/2018  Component Date Value Ref Range Status  . Sodium 12/15/2018 137  135 - 145 mEq/L Final  . Potassium 12/15/2018 3.9  3.5 - 5.1 mEq/L Final  . Chloride 12/15/2018 108  96 - 112 mEq/L Final  . CO2 12/15/2018 22  19 - 32 mEq/L Final  . Glucose, Bld 12/15/2018 172* 70 - 99 mg/dL Final  . BUN 12/15/2018 16  6 - 23 mg/dL Final  . Creatinine, Ser 12/15/2018 0.88  0.40 - 1.50 mg/dL Final  . Calcium 12/15/2018 9.1  8.4 - 10.5 mg/dL Final  . GFR 12/15/2018 90.01  >60.00 mL/min Final  . Hgb A1c MFr Bld 12/15/2018 6.8* 4.6 - 6.5 % Final   Glycemic Control Guidelines for People with Diabetes:Non Diabetic:  <6%Goal of Therapy: <7%Additional Action Suggested:  >8%     Physical Examination:  BP 128/76 (BP Location: Left Arm, Patient Position: Sitting, Cuff Size: Normal)   Pulse (!) 57   Ht 6' (  1.829 m)   Wt 205 lb 12.8 oz (93.4 kg)   SpO2 97%   BMI 27.91 kg/m      ASSESSMENT:  Diabetes type 2, on insulin  See history of present illness for detailed discussion of his current management, blood sugar patterns and problems identified  A1c is 6.8 which is the best in quite some time  Although previously was likely having more high postprandial readings he may be having better control now with being able to plan his meals better and bring attention to adjusting his insulin based on what he is eating Also was instructed on when to increase his blood sugars with different types of meals on the last visit  Also not always  having high carbohydrate meals Again his fasting blood sugars are somewhat variable Rarely may have low sugars late at night with smaller meals This may be partly related to using regular insulin instead of analog insulin  LIPIDS: Followed by cardiologist but does need follow-up labs  PLAN:    No change in basic insulin regimen  Bring blood sugar monitor on next visit and check readings after meals more consistently  To call if fasting readings are consistently high  Encouraged him to be as active as possible  To reduce his diarrhea he will switch to extended release metformin and take only 1 tablet at lunch in the morning and 1 in the evening  Increase Actos to 30 mg    There are no Patient Instructions on file for this visit.     Elayne Snare 12/18/2018, 2:30 PM   Note: This office note was prepared with Dragon voice recognition system technology. Any transcriptional errors that result from this process are unintentional.

## 2018-12-30 DIAGNOSIS — E119 Type 2 diabetes mellitus without complications: Secondary | ICD-10-CM | POA: Diagnosis not present

## 2018-12-30 DIAGNOSIS — H2512 Age-related nuclear cataract, left eye: Secondary | ICD-10-CM | POA: Diagnosis not present

## 2018-12-30 LAB — HM DIABETES EYE EXAM

## 2019-01-07 LAB — HM DIABETES EYE EXAM

## 2019-01-11 ENCOUNTER — Ambulatory Visit (INDEPENDENT_AMBULATORY_CARE_PROVIDER_SITE_OTHER): Payer: Medicare Other | Admitting: Internal Medicine

## 2019-01-11 ENCOUNTER — Ambulatory Visit (INDEPENDENT_AMBULATORY_CARE_PROVIDER_SITE_OTHER)
Admission: RE | Admit: 2019-01-11 | Discharge: 2019-01-11 | Disposition: A | Discharge status: Home / Self Care | Payer: Medicare Other | Source: Ambulatory Visit | Attending: Internal Medicine | Admitting: Internal Medicine

## 2019-01-11 ENCOUNTER — Encounter: Payer: Self-pay | Admitting: Internal Medicine

## 2019-01-11 VITALS — BP 134/68 | HR 62 | Temp 98.1°F | Resp 16 | Ht 72.0 in | Wt 208.0 lb

## 2019-01-11 DIAGNOSIS — M1611 Unilateral primary osteoarthritis, right hip: Secondary | ICD-10-CM | POA: Diagnosis not present

## 2019-01-11 DIAGNOSIS — M25551 Pain in right hip: Secondary | ICD-10-CM

## 2019-01-11 DIAGNOSIS — M150 Primary generalized (osteo)arthritis: Secondary | ICD-10-CM | POA: Diagnosis not present

## 2019-01-11 DIAGNOSIS — E118 Type 2 diabetes mellitus with unspecified complications: Secondary | ICD-10-CM | POA: Diagnosis not present

## 2019-01-11 DIAGNOSIS — M19011 Primary osteoarthritis, right shoulder: Secondary | ICD-10-CM | POA: Diagnosis not present

## 2019-01-11 DIAGNOSIS — M159 Polyosteoarthritis, unspecified: Secondary | ICD-10-CM

## 2019-01-11 DIAGNOSIS — M25511 Pain in right shoulder: Secondary | ICD-10-CM

## 2019-01-11 DIAGNOSIS — G8929 Other chronic pain: Secondary | ICD-10-CM | POA: Diagnosis not present

## 2019-01-11 MED ORDER — MELOXICAM 7.5 MG PO TABS: 7.5000 mg | ORAL_TABLET | Freq: Every day | ORAL | 1 refills | Status: DC

## 2019-01-11 NOTE — Progress Notes (Signed)
Subjective:  Patient ID: Alan Lopez, male    DOB: Jul 05, 1946  Age: 73 y.o. MRN: [id number]  CC: Osteoarthritis   HPI Alan Lopez presents for f/up - He complains of chronic and somewhat worsening pain in his right shoulder and right hip.  He is taking over-the-counter remedies such as Aleve and Tylenol but has not gotten much symptom relief.  He is not willing to do any kind of a surgery for this but he is willing to do physical therapy.  Outpatient Medications Prior to Visit  Medication Sig Dispense Refill  . alfuzosin (UROXATRAL) 10 MG 24 hr tablet Take 10 mg by mouth daily with breakfast.   11  . aspirin EC 81 MG tablet Take 81 mg by mouth daily.    . cholecalciferol (VITAMIN D) 1000 UNITS tablet Take 1,000 Units by mouth daily.    . finasteride (PROSCAR) 5 MG tablet Take 5 mg by mouth daily.  3  . fluticasone (FLONASE) 50 MCG/ACT nasal spray Place 2 sprays into both nostrils daily as needed for allergies (for seasonal allergies). Reported on 01/29/2016    . glucose blood (CONTOUR NEXT TEST) test strip USE TO TEST BLOOD SUGAR 2-3 TIMES DAILY 200 each 2  . Insulin Disposable Pump (V-GO 30) KIT INJECT 6 UNITS AT BREAKFAST, 8 UNITS AT LUNCH, AND 6-8 UNITS AT DINNER (Patient taking differently: INJECT 6 UNITS AT BREAKFAST, 6 UNITS AT LUNCH, AND 6 UNITS AT DINNER) 30 kit 2  . Insulin Pen Needle 32G X 4 MM MISC Use Three per day to inject insulin 100 each 5  . insulin regular (NOVOLIN R,HUMULIN R) 100 units/mL injection Inject into the skin 3 (three) times daily before meals. USE INSULIN TO FILL UP v-GO PUMP    . Lancets (FREESTYLE) lancets Use to test blood sugar once daily Dx code E11.8 100 each 12  . losartan (COZAAR) 25 MG tablet Take 0.5 tablets (12.5 mg total) by mouth daily. 45 tablet 3  . metFORMIN (GLUCOPHAGE-XR) 500 MG 24 hr tablet Take 1 tablet (500 mg total) by mouth 2 (two) times daily. 180 tablet 3  . metoprolol tartrate (LOPRESSOR) 25 MG tablet Take 0.5 tablets (12.5  mg total) by mouth 2 (two) times daily. 90 tablet 2  . pioglitazone (ACTOS) 15 MG tablet Take 1 tablet (15 mg total) by mouth daily. 90 tablet 0  . rosuvastatin (CRESTOR) 20 MG tablet Take 1/2 to 1 tablet by mouth daily as tolerated. 90 tablet 2  . fexofenadine (ALLEGRA) 180 MG tablet Take 180 mg by mouth daily as needed for allergies (for seasonal allergies). Reported on 01/29/2016     No facility-administered medications prior to visit.     ROS Review of Systems  Constitutional: Negative.  Negative for diaphoresis and fatigue.  HENT: Negative.   Eyes: Negative for visual disturbance.  Respiratory: Negative for cough, chest tightness, shortness of breath and wheezing.   Cardiovascular: Negative for chest pain, palpitations and leg swelling.  Gastrointestinal: Negative for abdominal pain, constipation, diarrhea, nausea and vomiting.  Endocrine: Negative for polydipsia, polyphagia and polyuria.  Genitourinary: Negative for difficulty urinating and dysuria.  Musculoskeletal: Positive for arthralgias. Negative for back pain, myalgias and neck pain.  Skin: Negative.  Negative for color change, pallor and rash.  Neurological: Negative.  Negative for dizziness and weakness.  Hematological: Negative for adenopathy. Does not bruise/bleed easily.  Psychiatric/Behavioral: Negative.     Objective:  BP 134/68   Pulse 62   Temp 98.1 F (36.7 C) (Oral)  Resp 16   Ht 6' (1.829 m)   Wt 208 lb (94.3 kg)   SpO2 96%   BMI 28.21 kg/m   BP Readings from Last 3 Encounters:  01/11/19 134/68  12/18/18 128/76  09/22/18 120/86    Wt Readings from Last 3 Encounters:  01/11/19 208 lb (94.3 kg)  12/18/18 205 lb 12.8 oz (93.4 kg)  09/22/18 207 lb (93.9 kg)    Physical Exam Vitals signs reviewed.  Constitutional:      Appearance: He is not ill-appearing or diaphoretic.  HENT:     Nose: Nose normal. No congestion.     Mouth/Throat:     Mouth: Mucous membranes are moist.     Pharynx:  Oropharynx is clear. No oropharyngeal exudate or posterior oropharyngeal erythema.  Eyes:     General: No scleral icterus.    Conjunctiva/sclera: Conjunctivae normal.  Neck:     Musculoskeletal: Normal range of motion and neck supple. No neck rigidity or muscular tenderness.  Cardiovascular:     Rate and Rhythm: Normal rate and regular rhythm.     Heart sounds: No murmur. No gallop.   Pulmonary:     Effort: Pulmonary effort is normal. No respiratory distress.     Breath sounds: No stridor. No wheezing or rales.  Abdominal:     General: Bowel sounds are normal.     Palpations: There is no mass.     Tenderness: There is no abdominal tenderness. There is no guarding.  Musculoskeletal: Normal range of motion.        General: No swelling or tenderness.     Right shoulder: He exhibits normal range of motion, no tenderness, no bony tenderness, no swelling, no effusion, no crepitus, no deformity and no pain.     Right hip: Normal. He exhibits normal range of motion, normal strength, no tenderness, no bony tenderness, no crepitus and no deformity.     Right lower leg: No edema.     Left lower leg: No edema.  Lymphadenopathy:     Cervical: No cervical adenopathy.  Skin:    General: Skin is warm and dry.     Coloration: Skin is not pale.  Neurological:     General: No focal deficit present.     Mental Status: He is oriented to person, place, and time. Mental status is at baseline.  Psychiatric:        Mood and Affect: Mood normal.        Behavior: Behavior normal.        Thought Content: Thought content normal.        Judgment: Judgment normal.     Lab Results  Component Value Date   WBC 8.8 01/29/2017   HGB 14.5 01/29/2017   HCT 42.2 01/29/2017   PLT 203.0 01/29/2017   GLUCOSE 172 (H) 12/15/2018   CHOL 107 02/20/2018   TRIG 81.0 02/20/2018   HDL 62.60 02/20/2018   LDLCALC 28 02/20/2018   ALT 44 02/20/2018   AST 29 02/20/2018   NA 137 12/15/2018   K 3.9 12/15/2018   CL 108  12/15/2018   CREATININE 0.88 12/15/2018   BUN 16 12/15/2018   CO2 22 12/15/2018   TSH 1.18 01/29/2017   HGBA1C 6.8 (H) 12/15/2018   MICROALBUR 1.5 09/15/2018    Mm Diag Breast Tomo Bilateral  Result Date: 02/12/2017 CLINICAL DATA:  Nipple tenderness EXAM: 2D DIGITAL DIAGNOSTIC BILATERAL MAMMOGRAM WITH CAD AND ADJUNCT TOMO COMPARISON:  Previous exam(s). ACR Breast Density Category a: The  breast tissue is almost entirely fatty. FINDINGS: No suspicious masses, calcifications, distortion, or gynecomastia. Mammographic images were processed with CAD. IMPRESSION: No mammographic evidence of malignancy. RECOMMENDATION: Treatment of the patient's symptoms should be based on clinical and physical exam given the lack of imaging findings. I have discussed the findings and recommendations with the patient. Results were also provided in writing at the conclusion of the visit. If applicable, a reminder letter will be sent to the patient regarding the next appointment. BI-RADS CATEGORY  1: Negative. Electronically Signed   By: Dorise Bullion III M.D   On: 02/12/2017 10:14   Dg Shoulder Right  Result Date: 01/11/2019 CLINICAL DATA:  Chronic right shoulder pain. EXAM: RIGHT SHOULDER - 2+ VIEW COMPARISON:  None. FINDINGS: No acute fracture or dislocation. Mild acromioclavicular joint space narrowing. The glenohumeral joint space is relatively preserved. Osteopenia. Soft tissues are unremarkable. IMPRESSION: 1. Mild acromioclavicular osteoarthritis. Electronically Signed   By: Titus Dubin M.D.   On: 01/11/2019 17:23   Dg Hip Unilat With Pelvis 2-3 Views Right  Result Date: 01/11/2019 CLINICAL DATA:  Chronic right hip pain. EXAM: DG HIP (WITH OR WITHOUT PELVIS) 2-3V RIGHT COMPARISON:  None. FINDINGS: There are tiny marginal osteophytes on the femoral head. There are enthesophytes at the gluteal tendon insertions on the greater trochanter of the proximal right femur. Pelvic bones appear normal. Joint spaces  preserved. IMPRESSION: Minimal degenerative changes of the right hip as described. No acute abnormalities. Electronically Signed   By: Lorriane Shire M.D.   On: 01/11/2019 17:23     Assessment & Plan:   Esdras was seen today for osteoarthritis.  Diagnoses and all orders for this visit:  Chronic right shoulder pain- His plain films are positive for mild DJD in the shoulder and hip.  I recommend that he start taking an anti-inflammatory in addition to the Tylenol.  He will stop taking Aleve.  He will also start physical therapy. -     DG Shoulder Right; Future -     Ambulatory referral to Physical Therapy  Chronic pain of right hip- As above -     DG HIP UNILAT WITH PELVIS 2-3 VIEWS RIGHT; Future -     Ambulatory referral to Physical Therapy  Primary osteoarthritis involving multiple joints- As above -     meloxicam (MOBIC) 7.5 MG tablet; Take 1 tablet (7.5 mg total) by mouth daily. -     Ambulatory referral to Physical Therapy  Type II diabetes mellitus with manifestations ([place])-     HM Diabetes Foot Exam - his blood sugars are adequately well controlled   I have discontinued Gwyndolyn Saxon Morejon's fexofenadine. I am also having him start on meloxicam. Additionally, I am having him maintain his finasteride, cholecalciferol, aspirin EC, fluticasone, Insulin Pen Needle, freestyle, insulin regular, alfuzosin, losartan, glucose blood, pioglitazone, V-GO 30, rosuvastatin, metoprolol tartrate, and metFORMIN.  Meds ordered this encounter  Medications  . meloxicam (MOBIC) 7.5 MG tablet    Sig: Take 1 tablet (7.5 mg total) by mouth daily.    Dispense:  90 tablet    Refill:  1     Follow-up: Return in about 3 months (around 04/11/2019).  [name], MD

## 2019-01-11 NOTE — Patient Instructions (Signed)

## 2019-01-12 ENCOUNTER — Encounter: Payer: Self-pay | Admitting: Internal Medicine

## 2019-01-13 ENCOUNTER — Encounter: Payer: Self-pay | Admitting: Internal Medicine

## 2019-01-18 ENCOUNTER — Other Ambulatory Visit: Payer: Medicare Other | Admitting: *Deleted

## 2019-01-18 DIAGNOSIS — E785 Hyperlipidemia, unspecified: Secondary | ICD-10-CM | POA: Diagnosis not present

## 2019-01-18 LAB — HEPATIC FUNCTION PANEL
ALBUMIN: 4.1 g/dL (ref 3.7–4.7)
ALT: 37 IU/L (ref 0–44)
AST: 25 IU/L (ref 0–40)
Alkaline Phosphatase: 58 IU/L (ref 39–117)
BILIRUBIN TOTAL: 0.5 mg/dL (ref 0.0–1.2)
Bilirubin, Direct: 0.16 mg/dL (ref 0.00–0.40)
Total Protein: 6.1 g/dL (ref 6.0–8.5)

## 2019-01-18 LAB — LIPID PANEL
Chol/HDL Ratio: 1.8 ratio (ref 0.0–5.0)
Cholesterol, Total: 120 mg/dL (ref 100–199)
HDL: 67 mg/dL (ref >39)
LDL Calculated: 39 mg/dL (ref 0–99)
Triglycerides: 68 mg/dL (ref 0–149)
VLDL Cholesterol Cal: 14 mg/dL (ref 5–40)

## 2019-02-03 ENCOUNTER — Ambulatory Visit (INDEPENDENT_AMBULATORY_CARE_PROVIDER_SITE_OTHER): Payer: Medicare Other | Admitting: *Deleted

## 2019-02-03 VITALS — BP 154/78 | HR 63 | Resp 16 | Ht 72.0 in | Wt 210.0 lb

## 2019-02-03 DIAGNOSIS — Z0000 Encounter for general adult medical examination without abnormal findings: Secondary | ICD-10-CM | POA: Diagnosis not present

## 2019-02-03 NOTE — Progress Notes (Addendum)
Subjective:   Alan Lopez is a 73 y.o. male who presents for Medicare Annual (Subsequent) preventive examination.  Review of Systems:  No ROS.  Medicare Wellness Visit. Additional risk factors are reflected in the social history.  Cardiac Risk Factors include: advanced age (>44mn, >>71women);diabetes mellitus;dyslipidemia;male gender;hypertension Sleep patterns: gets up 1-2 times nightly to void and sleeps 5-7 hours nightly.    Home Safety/Smoke Alarms: Feels safe in home. Smoke alarms in place.  Living environment; residence and Firearm Safety: 1-story house/ trailer. Lives alone and son recently moved in home temporarily, no needs for DME, good support system Seat Belt Safety/Bike Helmet: Wears seat belt.    Objective:     Vitals: BP (!) 154/78   Pulse 63   Resp 16   Ht 6' (1.829 m)   Wt 210 lb (95.3 kg)   SpO2 98%   BMI 28.48 kg/m   Body mass index is 28.48 kg/m.  Advanced Directives 02/03/2019 01/28/2018 05/16/2015  Does Patient Have a Medical Advance Directive? Yes Yes Yes  Type of [place]aramed[place]of [place] will [place] will -  Copy of [place] Chart? No - copy requested No - copy requested -    Tobacco Social History   Tobacco Use  Smoking Status Light Tobacco Smoker  . Types: Cigarettes  Smokeless Tobacco Never Used  Tobacco Comment   smokes on social occassions     Ready to quit: No Counseling given: No Comment: smokes on social occassions  Past Medical History:  Diagnosis Date  . 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 HTN (hypertension)   . Hypertrophy (benign) of prostate   . Multinodular goiter 04/12/2015  . Non-insulin dependent type 2 diabetes mellitus ([place]   . Status post non-ST elevation myocardial infarction (NSTEMI)    CATH  . Tobacco abuse   . Type II diabetes mellitus, uncontrolled ([place]ana Di[place]az 04/12/2015   Past Surgical History:  Procedure Laterality Date  .  CORONARY ANGIOPLASTY WITH STENT PLACEMENT     Family History  Problem Relation Age of Onset  . Diabetes Mother   . Diabetes Brother   . Cancer Brother   . Heart disease Neg Hx    Social History   Socioeconomic History  . Marital status: Widowed    Spouse name: Not on file  . Number of children: 3  . Years of education: Not on file  . Highest education level: Not on file  Occupational History  . Occupation: retired  SScientific laboratory technician . Financial resource strain: Not hard at all  . Food insecurity:    Worry: Never true    Inability: Never true  . Transportation needs:    Medical: No    Non-medical: No  Tobacco Use  . Smoking status: Light Tobacco Smoker    Types: Cigarettes  . Smokeless tobacco: Never Used  . Tobacco comment: smokes on social occassions  Substance and Sexual Activity  . Alcohol use: Yes    Alcohol/week: 10.0 standard drinks    Types: 10 Cans of beer per week    Comment: socially  . Drug use: No  . Sexual activity: Never  Lifestyle  . Physical activity:    Days per week: 4 days    Minutes per session: 50 min  . Stress: To some extent  Relationships  . Social connections:    Talks on phone: More than three times a week    Gets together: More than three times  a week    Attends religious service: More than 4 times per year    Active member of club or organization: Not on file    Attends meetings of clubs or organizations: More than 4 times per year    Relationship status: Married  Other Topics Concern  . Not on file  Social History Narrative  . Not on file    Outpatient Encounter Medications as of 02/03/2019  Medication Sig  . alfuzosin (UROXATRAL) 10 MG 24 hr tablet Take 10 mg by mouth daily with breakfast.   . aspirin EC 81 MG tablet Take 81 mg by mouth daily.  . cholecalciferol (VITAMIN D) 1000 UNITS tablet Take 1,000 Units by mouth daily.  . finasteride (PROSCAR) 5 MG tablet Take 5 mg by mouth daily.  . fluticasone (FLONASE) 50 MCG/ACT nasal  spray Place 2 sprays into both nostrils daily as needed for allergies (for seasonal allergies). Reported on 01/29/2016  . glucose blood (CONTOUR NEXT TEST) test strip USE TO TEST BLOOD SUGAR 2-3 TIMES DAILY  . Insulin Disposable Pump (V-GO 30) KIT INJECT 6 UNITS AT BREAKFAST, 8 UNITS AT LUNCH, AND 6-8 UNITS AT DINNER (Patient taking differently: INJECT 4 UNITS AT BREAKFAST, 6 UNITS AT LUNCH, AND 6 UNITS AT DINNER)  . Insulin Pen Needle 32G X 4 MM MISC Use Three per day to inject insulin  . insulin regular (NOVOLIN R,HUMULIN R) 100 units/mL injection Inject into the skin 3 (three) times daily before meals. USE INSULIN TO FILL UP v-GO PUMP  . Lancets (FREESTYLE) lancets Use to test blood sugar once daily Dx code E11.8  . losartan (COZAAR) 25 MG tablet Take 0.5 tablets (12.5 mg total) by mouth daily.  . meloxicam (MOBIC) 7.5 MG tablet Take 1 tablet (7.5 mg total) by mouth daily.  . metFORMIN (GLUCOPHAGE-XR) 500 MG 24 hr tablet Take 1 tablet (500 mg total) by mouth 2 (two) times daily.  . metoprolol tartrate (LOPRESSOR) 25 MG tablet Take 0.5 tablets (12.5 mg total) by mouth 2 (two) times daily.  . pioglitazone (ACTOS) 15 MG tablet Take 1 tablet (15 mg total) by mouth daily.  . rosuvastatin (CRESTOR) 20 MG tablet Take 1/2 to 1 tablet by mouth daily as tolerated.   No facility-administered encounter medications on file as of 02/03/2019.     Activities of Daily Living In your present state of health, do you have any difficulty performing the following activities: 02/03/2019  Hearing? N  Vision? N  Difficulty concentrating or making decisions? N  Walking or climbing stairs? N  Dressing or bathing? N  Doing errands, shopping? N  Preparing Food and eating ? N  Using the Toilet? N  In the past six months, have you accidently leaked urine? N  Do you have problems with loss of bowel control? N  Managing your Medications? N  Managing your Finances? N  Housekeeping or managing your Housekeeping? N  Some  recent data might be hidden    Patient Care Team: Janith Lima, MD as PCP - General (Internal Medicine) Elayne Snare, MD as Consulting Physician (Endocrinology) Melissa Noon, Hawley as Referring Physician (Optometry) Pilar Jarvis, Horald Pollen, MD as Consulting Physician (Urology) Jerline Pain, MD as Consulting Physician (Cardiology) Ocie Doyne, RN as Registered Nurse    Assessment:   This is a routine wellness examination for Khalon. Physical assessment deferred to PCP.  Exercise Activities and Dietary recommendations Current Exercise Habits: The patient does not participate in regular exercise at present, Exercise limited by:  orthopedic condition(s)  Diet (meal preparation, eat out, water intake, caffeinated beverages, dairy products, fruits and vegetables): in general, a "healthy" diet  , well balanced   Reviewed heart healthy and diabetic diet. Encouraged patient to increase daily water and healthy fluid intake.  Goals    . Patient Stated     Stay as healthy and as independent as possible. Continue to do yard work, socialize with friends, Scientist, water quality, enjoy life and family.    . Patient Stated     I would like to travel, improve my health by eating healthier and increasing my physical activity.        Fall Risk Fall Risk  02/03/2019 01/28/2018 12/30/2017 08/16/2016 05/16/2015  Falls in the past year? 1 Yes Yes No No  Comment - - - Emmi Telephone Survey: data to providers prior to load -  Number falls in past yr: 0 2 or more 2 or more - -  Injury with Fall? 0 No - - -  Risk for fall due to : - Impaired balance/gait - - -  Follow up - Falls prevention discussed - - -    Depression Screen PHQ 2/9 Scores 02/03/2019 01/28/2018 12/30/2017 04/26/2016  PHQ - 2 Score 1 2 0 0  PHQ- 9 Score - 3 - -     Cognitive Function MMSE - Mini Mental State Exam 01/28/2018  Orientation to time 5  Orientation to Place 5  Registration 3  Attention/ Calculation 5  Recall 2  Language- name 2  objects 2  Language- repeat 1  Language- follow 3 step command 3  Language- read & follow direction 1  Write a sentence 1  Copy design 1  Total score 29       Ad8 score reviewed for issues:  Issues making decisions: no  Less interest in hobbies / activities: no  Repeats questions, stories (family complaining): no  Trouble using ordinary gadgets (microwave, computer, phone):no  Forgets the month or year: no  Mismanaging finances: no  Remembering appts: no  Daily problems with thinking and/or memory: no Ad8 score is= 0  Immunization History  Administered Date(s) Administered  . Influenza, High Dose Seasonal PF 09/12/2016, 11/08/2017  . Influenza,inj,Quad PF,6+ Mos 09/22/2018  . Influenza-Unspecified 09/12/2014, 08/30/2015  . Pneumococcal Conjugate-13 09/13/2013  . Pneumococcal Polysaccharide-23 01/01/2018  . Tdap 07/21/2013  . Zoster 08/30/2015   Screening Tests Health Maintenance  Topic Date Due  . Hepatitis C Screening  [birth year]  . HEMOGLOBIN A1C  06/15/2019  . OPHTHALMOLOGY EXAM  01/08/2020  . FOOT EXAM  01/13/2020  . Fecal DNA (Cologuard)  03/24/2020  . TETANUS/TDAP  07/22/2023  . INFLUENZA VACCINE  Completed  . PNA vac Low Risk Adult  Completed      Plan:      Reviewed health maintenance screenings with patient today and relevant education, vaccines, and/or referrals were provided.   Continue doing brain stimulating activities (puzzles, reading, adult coloring books, staying active) to keep memory sharp.   Continue to eat heart healthy diet (full of fruits, vegetables, whole grains, lean protein, water--limit salt, fat, and sugar intake) and increase physical activity as tolerated.  I have personally reviewed and noted the following in the patient's chart:   . Medical and social history . Use of alcohol, tobacco or illicit drugs  . Current medications and supplements . Functional ability and status . Nutritional status . Physical  activity . Advanced directives . List of other physicians . Vitals . Screenings to include cognitive, depression,  and falls . Referrals and appointments  In addition, I have reviewed and discussed with patient certain preventive protocols, quality metrics, and best practice recommendations. A written personalized care plan for preventive services as well as general preventive health recommendations were provided to patient.     Michiel Cowboy, RN  02/03/2019    Medical screening examination/treatment/procedure(s) were performed by non-physician practitioner and as supervising physician I was immediately available for consultation/collaboration. I agree with above. [name], MD

## 2019-02-03 NOTE — Patient Instructions (Signed)
Continue doing brain stimulating activities (puzzles, reading, adult coloring books, staying active) to keep memory sharp.   Continue to eat heart healthy diet (full of fruits, vegetables, whole grains, lean protein, water--limit salt, fat, and sugar intake) and increase physical activity as tolerated.   Mr. Alan Lopez , Thank you for taking time to come for your Medicare Wellness Visit. I appreciate your ongoing commitment to your health goals. Please review the following plan we discussed and let me know if I can assist you in the future.   These are the goals we discussed: Goals    . Patient Stated     Stay as healthy and as independent as possible. Continue to do yard work, socialize with friends, Scientist, water quality, enjoy life and family.    . Patient Stated     I would like to travel, improve my health by eating healthier and increasing my physical activity.        This is a list of the screening recommended for you and due dates:  Health Maintenance  Topic Date Due  .  Hepatitis C: One time screening is recommended by Center for Disease Control  (CDC) for  adults born from [birth year] through 1966.   1946/09/18  . Hemoglobin A1C  06/15/2019  . Eye exam for diabetics  01/08/2020  . Complete foot exam   01/13/2020  . Cologuard (Stool DNA test)  03/24/2020  . Tetanus Vaccine  07/22/2023  . Flu Shot  Completed  . Pneumonia vaccines  Completed    Preventive Care 110 Years and Older, Male Preventive care refers to lifestyle choices and visits with your health care provider that can promote health and wellness. What does preventive care include?   A yearly physical exam. This is also called an annual well check.  Dental exams once or twice a year.  Routine eye exams. Ask your health care provider how often you should have your eyes checked.  Personal lifestyle choices, including: ? Daily care of your teeth and gums. ? Regular physical activity. ? Eating a healthy diet. ? Avoiding  tobacco and drug use. ? Limiting alcohol use. ? Practicing safe sex. ? Taking low doses of aspirin every day. ? Taking vitamin and mineral supplements as recommended by your health care provider. What happens during an annual well check? The services and screenings done by your health care provider during your annual well check will depend on your age, overall health, lifestyle risk factors, and family history of disease. Counseling Your health care provider may ask you questions about your:  Alcohol use.  Tobacco use.  Drug use.  Emotional well-being.  Home and relationship well-being.  Sexual activity.  Eating habits.  History of falls.  Memory and ability to understand (cognition).  Work and work Statistician. Screening You may have the following tests or measurements:  Height, weight, and BMI.  Blood pressure.  Lipid and cholesterol levels. These may be checked every 5 years, or more frequently if you are over 13 years old.  Skin check.  Lung cancer screening. You may have this screening every year starting at age 22 if you have a 30-pack-year history of smoking and currently smoke or have quit within the past 15 years.  Colorectal cancer screening. All adults should have this screening starting at age 69 and continuing until age 42. You will have tests every 1-10 years, depending on your results and the type of screening test. People at increased risk should start screening at an earlier age. Screening  tests may include: ? Guaiac-based fecal occult blood testing. ? Fecal immunochemical test (FIT). ? Stool DNA test. ? Virtual colonoscopy. ? Sigmoidoscopy. During this test, a flexible tube with a tiny camera (sigmoidoscope) is used to examine your rectum and lower colon. The sigmoidoscope is inserted through your anus into your rectum and lower colon. ? Colonoscopy. During this test, a long, thin, flexible tube with a tiny camera (colonoscope) is used to examine your  entire colon and rectum.  Prostate cancer screening. Recommendations will vary depending on your family history and other risks.  Hepatitis C blood test.  Hepatitis B blood test.  Sexually transmitted disease (STD) testing.  Diabetes screening. This is done by checking your blood sugar (glucose) after you have not eaten for a while (fasting). You may have this done every 1-3 years.  Abdominal aortic aneurysm (AAA) screening. You may need this if you are a current or former smoker.  Osteoporosis. You may be screened starting at age 65 if you are at high risk. Talk with your health care provider about your test results, treatment options, and if necessary, the need for more tests. Vaccines Your health care provider may recommend certain vaccines, such as:  Influenza vaccine. This is recommended every year.  Tetanus, diphtheria, and acellular pertussis (Tdap, Td) vaccine. You may need a Td booster every 10 years.  Varicella vaccine. You may need this if you have not been vaccinated.  Zoster vaccine. You may need this after age 89.  Measles, mumps, and rubella (MMR) vaccine. You may need at least one dose of MMR if you were born in 1957 or later. You may also need a second dose.  Pneumococcal 13-valent conjugate (PCV13) vaccine. One dose is recommended after age 50.  Pneumococcal polysaccharide (PPSV23) vaccine. One dose is recommended after age 2.  Meningococcal vaccine. You may need this if you have certain conditions.  Hepatitis A vaccine. You may need this if you have certain conditions or if you travel or work in places where you may be exposed to hepatitis A.  Hepatitis B vaccine. You may need this if you have certain conditions or if you travel or work in places where you may be exposed to hepatitis B.  Haemophilus influenzae type b (Hib) vaccine. You may need this if you have certain risk factors. Talk to your health care provider about which screenings and vaccines you  need and how often you need them. This information is not intended to replace advice given to you by your health care provider. Make sure you discuss any questions you have with your health care provider. Document Released: 12/22/2015 Document Revised: 01/15/2018 Document Reviewed: 09/26/2015 Elsevier Interactive Patient Education  2019 [name].

## 2019-02-07 ENCOUNTER — Other Ambulatory Visit: Payer: Self-pay | Admitting: Internal Medicine

## 2019-02-16 DIAGNOSIS — R972 Elevated prostate specific antigen [PSA]: Secondary | ICD-10-CM | POA: Diagnosis not present

## 2019-02-22 ENCOUNTER — Other Ambulatory Visit: Payer: Self-pay

## 2019-02-22 ENCOUNTER — Encounter: Payer: Self-pay | Admitting: Physical Therapy

## 2019-02-22 ENCOUNTER — Ambulatory Visit: Payer: Medicare Other | Attending: Internal Medicine | Admitting: Physical Therapy

## 2019-02-22 DIAGNOSIS — R293 Abnormal posture: Secondary | ICD-10-CM | POA: Diagnosis not present

## 2019-02-22 DIAGNOSIS — G8929 Other chronic pain: Secondary | ICD-10-CM | POA: Diagnosis not present

## 2019-02-22 DIAGNOSIS — M25511 Pain in right shoulder: Secondary | ICD-10-CM | POA: Diagnosis not present

## 2019-02-22 DIAGNOSIS — M6281 Muscle weakness (generalized): Secondary | ICD-10-CM | POA: Diagnosis not present

## 2019-02-22 DIAGNOSIS — M25551 Pain in right hip: Secondary | ICD-10-CM | POA: Insufficient documentation

## 2019-02-22 NOTE — Therapy (Signed)
[facility] [address], [place], [place], [address] Phone: [phone]   Fax:  [phone]  Physical Therapy Evaluation  Patient Details  Name: Alan Lopez MRN: [id number] Date of Birth: May 22, 1946 Referring Provider (PT): Janith Lima, MD   Encounter Date: 02/22/2019  PT End of Session - 02/22/19 1428    Visit Number  1    Date for PT Re-Evaluation  04/19/19    Authorization Type  Medicare BCBS    PT Start Time  4268    PT Stop Time  1102    PT Time Calculation (min)  47 min    Activity Tolerance  Patient tolerated treatment well    Behavior During Therapy  [facility] for tasks assessed/performed       Past Medical History:  Diagnosis Date  . 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 HTN (hypertension)   . Hypertrophy (benign) of prostate   . Multinodular goiter 04/12/2015  . Non-insulin dependent type 2 diabetes mellitus ([place])   . Status post non-ST elevation myocardial infarction (NSTEMI)    CATH  . Tobacco abuse   . Type II diabetes mellitus, uncontrolled ([place]) 04/12/2015    Past Surgical History:  Procedure Laterality Date  . CORONARY ANGIOPLASTY WITH STENT PLACEMENT      There were no vitals filed for this visit.   Subjective Assessment - 02/22/19 1018    Subjective  Pt referred to PT for chronic Rt hip and Rt shoulder pain.  He sleeps on Rt side.  Anti-imflammatories have helped.  Walking is limited by Rt hip pain.  Needs to be able to do yard work.    Pertinent History  cardio rehab in past, stent placed approx 3 years ago    Limitations  Walking;Lifting;House hold activities;Standing;Sitting    How long can you sit comfortably?  depends on seat    How long can you stand comfortably?  15 min    How long can you walk comfortably?  several blocks    Diagnostic tests  Rt shoulder xray: mild AC OA, osteopenia Rt hip xray: mild degenerative changes    Patient Stated Goals  falling asleep due to Rt sided  pain in Rt sidelying (how he sleeps),     Currently in Pain?  Yes    Pain Score  2    ranges 1-3   Pain Location  Hip    Pain Orientation  Right;Lateral;Posterior    Pain Type  Chronic pain    Pain Onset  More than a month ago    Pain Frequency  Intermittent    Aggravating Factors   laying on hip (Rt), transitions to standing/walking after sitting    Pain Relieving Factors  change of position    Effect of Pain on Daily Activities  sleeping, walking > 2 blocks, yard work    Multiple Pain Sites  Yes    Pain Score  2   can get up to 6/10   Pain Location  Shoulder    Pain Orientation  Right    Pain Descriptors / Indicators  Sharp    Pain Type  Chronic pain    Pain Radiating Towards  local    Pain Onset  More than a month ago    Pain Frequency  Intermittent    Aggravating Factors   reaching overhead    Pain Relieving Factors  avoid overhead, small range stretching/ROM    Effect of Pain on Daily Activities  household activities, yard  work         [facility] PT Assessment - 02/22/19 0001      Assessment   Medical Diagnosis  M25.551,G89.29 (ICD-10-CM) - Chronic pain of right hip, M25.511,G89.29 (ICD-10-CM) - Chronic right shoulder pain    Referring Provider (PT)  Janith Lima, MD    Next MD Visit  --   May 2020   Prior Therapy  no      Precautions   Precautions  None    Precaution Comments  cardiac stent      Restrictions   Weight Bearing Restrictions  No      Balance Screen   Has the patient fallen in the past 6 months  Yes    How many times?  2   yardwork, no injury   Has the patient had a decrease in activity level because of a fear of falling?   No    Is the patient reluctant to leave their home because of a fear of falling?   No      Home Environment   Living Environment  Private residence    Type of [place] to enter    Entrance Stairs-Number of Steps  2    [place]  One level      Prior Function   Level of Independence  Independent     Vocation  Retired    Leisure  yard work, Scientist, water quality, travel, grandkids      Cognition   Overall Cognitive Status  Within Functional Limits for tasks assessed      Observation/Other Assessments   Focus on Therapeutic Outcomes (FOTO)   57%   46% goal     Sensation   Light Touch  Appears Intact      Posture/Postural Control   Posture/Postural Control  Postural limitations    Postural Limitations  Decreased thoracic kyphosis;Rounded Shoulders   increased in superior aspect of thoracic, Dowager's hump     ROM / Strength   AROM / PROM / Strength  AROM;Strength;PROM      AROM   Overall AROM Comments  painful arc present on eccentric phase of motion Rt shoulder flexion    AROM Assessment Site  Shoulder    Right/Left Shoulder  Right;Left    Right Shoulder Flexion  165 Degrees    Right Shoulder ABduction  165 Degrees    Right Shoulder Internal Rotation  --   limited 20% compared to Lt   Left Shoulder Flexion  170 Degrees    Left Shoulder ABduction  170 Degrees      PROM   Overall PROM Comments  bil hip ER limited 30%      Strength   Overall Strength Comments  Rt shoulder abduction 3+/5 with pain, ER 4/5 without pain, all other UE strength 5/5 bil.  Rt hip abduction and ER 4-/5 with pain along posterior aspect of greater trochanter, all other LE strength 5/5      Palpation   Palpation comment  Rt supraspinatus and bicep tendons, Rt piriformis tendon, gluteal tendons at greater trochanter insertion      Special Tests    Special Tests  Rotator Cuff Impingement;Hip Special Tests    Rotator Cuff Impingment tests  Michel Bickers test    Hip Special Tests   Hip Scouring;Anterior Hip Impingement Test      Hawkins-Kennedy test   Findings  Positive    Side  Right      Hip  Scouring   Findings  Negative    Side  Right      Anterior Hip Impingement Test    Findings  Negative    Side   Right                Objective measurements completed on examination: See above  findings.      [facility] Adult PT Treatment/Exercise - 02/22/19 0001      Self-Care   Self-Care  Posture;Other Self-Care Comments    Posture  neck retraction, scapular support with retraction    Other Self-Care Comments   sleeping position on Rt side with pillows to skew toward laying on back but partially on Rt side and avoiding Rt shoulder flexion up under pillow             PT Education - 02/22/19 1100    Education Details  Access Code: [email or web address]     Person(s) Educated  Patient    Methods  Explanation;Demonstration;Verbal cues;Handout    Comprehension  Verbalized understanding;Returned demonstration       PT Short Term Goals - 02/22/19 1825      PT SHORT TERM GOAL #1   Title  Pt I in initial HEP for postural strength and mobility related to Rt shoulder and Rt hip pain.    Time  4    Period  Weeks    Status  New    Target Date  03/22/19      PT SHORT TERM GOAL #2   Title  Pt will display Rt shoulder flexion to at least 165 degrees with improved scapular control in concentric/eccentric phases with pain </= 2/10 to improve functional use of Rt UE for overhead tasks.    Time  4    Period  Weeks    Status  New    Target Date  03/22/19      PT SHORT TERM GOAL #3   Title  Pt will report reduction of Rt hip pain by at least 25% to improve activities such as sleeping on Rt side    Time  4    Period  Weeks    Status  New    Target Date  03/22/19        PT Long Term Goals - 02/22/19 1829      PT LONG TERM GOAL #1   Title  Pt will be I in advanced HEP for functional strength and mobility of upper and lower quadrants to improve quality of life.    Time  8    Period  Weeks    Status  New    Target Date  04/19/19      PT LONG TERM GOAL #2   Title  Pt will reduce FOTO to </= 46% to demo less limitation in daily activities.    Time  8    Period  Weeks    Status  New    Target Date  04/19/19      PT LONG TERM GOAL #3   Title  Pt will report improved sleep by at least  50% due to improved pain in Rt shoudler and hip.    Time  8    Period  Weeks    Status  New    Target Date  04/19/19      PT LONG TERM GOAL #4   Title  Pt will be able to ambulate community distances with Rt hip pain rating </= 3/10 for tasks like grocery shopping and other  errands.    Time  8    Period  Weeks    Status  New    Target Date  04/19/19      PT LONG TERM GOAL #5   Title  Pt will achieve Rt shoulder and hip strength rating of at least 4+/5 to improve functional activities like yard work with less strain on joints.    Time  8    Period  Weeks    Status  New    Target Date  04/19/19             Plan - 02/22/19 1435    Clinical Impression Statement  Pt presents with history of chronic Rt sided shoulder and hip pain.  Hip is worse than shoulder.  Hip pain prevents him from walking > several blocks although he does work in his yard for hours at a time.  Pt has pain with reaching overhead and sleeping on Rt hip.  He presents with scapular dyskinesis and limited end range Rt shoulder flexion and IR with pain.  He has limited Rt AC joint mobility.  Eccentric painful arc is present in Rt shoulder.  He has + impingement signs on Rt and weak/painful shoulder ER/abduction.  Tenderness is present on supraspinatus tendon and bicep tendon on Rt.  Bil hip ROM is limited and non-painful.  Tenderness is present along gluteal and piriformis tendons on insertion point on posterior greater trochanter.  PT reviewed sleeping posture today and began HEP for postural re-ed and strength.  Pt will benefit from skilled PT for ROM, strength, postural re-ed and strengthening to improve pain and tolerance of daily tasks including working in the yard.    Personal Factors and Comorbidities  Comorbidity 1;Time since onset of injury/illness/exacerbation    Comorbidities  cardiac surgery with stent placed 2017    Examination-Activity Limitations  Lift;Sleep;Reach Overhead;Carry;Stand;Locomotion Level     Examination-Participation Restrictions  Yard Work;Community Activity    Stability/Clinical Decision Making  Stable/Uncomplicated    Clinical Decision Making  Low    Rehab Potential  Excellent    PT Frequency  2x / week    PT Duration  8 weeks    PT Treatment/Interventions  ADLs/Self Care Home Management;Cryotherapy;Electrical Stimulation;Iontophoresis 4mg /ml Dexamethasone;Moist Heat;Functional mobility training;Therapeutic activities;Therapeutic exercise;Balance training;Neuromuscular re-education;Patient/family education;Manual techniques;Passive range of motion;Dry needling;Taping;Spinal Manipulations;Joint Manipulations    PT Next Visit Plan  f/u on HEP, dexa patch Rt hip if cert signed, Rt AC joint mob, AA/ROM Rt shoulder, cerv/scap stabilization, core intro    PT Home Exercise Plan  Access Code: [email or web address] and Agree with Plan of Care  Patient       Patient will benefit from skilled therapeutic intervention in order to improve the following deficits and impairments:  Pain, Postural dysfunction, Impaired flexibility, Decreased strength, Decreased activity tolerance, Improper body mechanics, Decreased range of motion, Abnormal gait, Decreased endurance, Decreased mobility, Hypomobility  Visit Diagnosis: Pain in right hip - Plan: PT plan of care cert/re-cert  Chronic right shoulder pain - Plan: PT plan of care cert/re-cert  Abnormal posture - Plan: PT plan of care cert/re-cert  Muscle weakness (generalized) - Plan: PT plan of care cert/re-cert     Problem List Patient Active Problem List   Diagnosis Date Noted  . Chronic right shoulder pain 01/11/2019  . Chronic pain of right hip 01/11/2019  . 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  . Viral warts 01/02/2018  . BPH associated with nocturia 01/29/2017  . Gynecomastia, male  01/29/2017  . Type II diabetes mellitus with complication ([place]) 75/33/9179  . S/P PTCA (percutaneous transluminal coronary  angioplasty) 12/29/2015  . NSTEMI (non-ST elevated myocardial infarction) ([place]) 12/29/2015  . Coronary artery disease due to lipid rich plaque 12/29/2015  . Hyperlipidemia LDL goal <70 12/29/2015  . Multinodular goiter 04/12/2015  . Blindness of left eye 04/12/2015   Baruch Merl, PT 02/22/19 6:35 PM   [facility] [address] [place], [place], [address] Phone: [phone]   Fax:  [phone]  Name: Alan Lopez MRN: [id number] Date of Birth: [birth year]

## 2019-02-22 NOTE — Patient Instructions (Signed)
Access Code: [email or web address]  URL: [email or web address]  Date: 02/22/2019  Prepared by: Venetia Night Jacklin Zwick   Exercises  Seated Figure 4 Piriformis Stretch - 3 reps - 3 sets - 30 hold - 1x daily - 7x weekly  Seated Scapular Retraction - 10 reps - 3 sets - 1x daily - 7x weekly  Standing Backward Shoulder Rolls - 10 reps - 3 sets - 1x daily - 7x weekly  Seated Cervical Retraction - 10 reps - 3 sets - 5 hold - 1x daily - 7x weekly

## 2019-02-23 ENCOUNTER — Ambulatory Visit: Payer: Medicare Other

## 2019-02-23 ENCOUNTER — Other Ambulatory Visit: Payer: Self-pay

## 2019-02-23 DIAGNOSIS — G8929 Other chronic pain: Secondary | ICD-10-CM

## 2019-02-23 DIAGNOSIS — R972 Elevated prostate specific antigen [PSA]: Secondary | ICD-10-CM | POA: Diagnosis not present

## 2019-02-23 DIAGNOSIS — M6281 Muscle weakness (generalized): Secondary | ICD-10-CM | POA: Diagnosis not present

## 2019-02-23 DIAGNOSIS — M25511 Pain in right shoulder: Secondary | ICD-10-CM

## 2019-02-23 DIAGNOSIS — N400 Enlarged prostate without lower urinary tract symptoms: Secondary | ICD-10-CM | POA: Diagnosis not present

## 2019-02-23 DIAGNOSIS — R293 Abnormal posture: Secondary | ICD-10-CM | POA: Diagnosis not present

## 2019-02-23 DIAGNOSIS — M25551 Pain in right hip: Secondary | ICD-10-CM

## 2019-02-23 NOTE — Therapy (Addendum)
[facility] [address] [place], [place], [address] Phone: [phone]   Fax:  [phone]  Physical Therapy Treatment  Patient Details  Name: Alan Lopez MRN: [id number] Date of Birth: 12-30-1945 Referring Provider (PT): Janith Lima, MD   Encounter Date: 02/23/2019  PT End of Session - 02/23/19 1318    Visit Number  2    Date for PT Re-Evaluation  04/19/19    Authorization Type  Medicare BCBS    PT Start Time  1237    PT Stop Time  1317    PT Time Calculation (min)  40 min    Activity Tolerance  Patient tolerated treatment well    Behavior During Therapy  [facility] for tasks assessed/performed       Past Medical History:  Diagnosis Date  . 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 HTN (hypertension)   . Hypertrophy (benign) of prostate   . Multinodular goiter 04/12/2015  . Non-insulin dependent type 2 diabetes mellitus ([place])   . Status post non-ST elevation myocardial infarction (NSTEMI)    CATH  . Tobacco abuse   . Type II diabetes mellitus, uncontrolled ([place]) 04/12/2015    Past Surgical History:  Procedure Laterality Date  . CORONARY ANGIOPLASTY WITH STENT PLACEMENT      There were no vitals filed for this visit.  Subjective Assessment - 02/23/19 1240    Subjective  I have been doing the exercises.      Patient Stated Goals  falling asleep due to Rt sided pain in Rt sidelying (how he sleeps),     Currently in Pain?  Yes    Pain Score  2     Pain Location  Hip    Pain Orientation  Right    Pain Type  Chronic pain    Pain Onset  More than a month ago                       [facility] Adult PT Treatment/Exercise - 02/23/19 0001      Exercises   Exercises  Knee/Hip;Lumbar;Shoulder      Lumbar Exercises: Stretches   Lower Trunk Rotation  3 reps;20 seconds    Piriformis Stretch  Right;3 reps;20 seconds      Knee/Hip Exercises: Stretches   Active Hamstring Stretch  Left;Right;3 reps;20  seconds    Piriformis Stretch  3 reps;20 seconds;Both      Knee/Hip Exercises: Aerobic   Nustep  Level 3 x 8 minutes    PT present to discuss progress     Knee/Hip Exercises: Sidelying   Clams  2x10 on Rt    tactile cues needed to reduce trunk motion            PT Education - 02/23/19 1312    Education Details  Access Code: [email or web address]    Person(s) Educated  Patient    Methods  Explanation;Demonstration    Comprehension  Verbalized understanding;Returned demonstration       PT Short Term Goals - 02/22/19 1825      PT SHORT TERM GOAL #1   Title  Pt I in initial HEP for postural strength and mobility related to Rt shoulder and Rt hip pain.    Time  4    Period  Weeks    Status  New    Target Date  03/22/19      PT SHORT TERM GOAL #2   Title  Pt will display Rt shoulder flexion  to at least 165 degrees with improved scapular control in concentric/eccentric phases with pain </= 2/10 to improve functional use of Rt UE for overhead tasks.    Time  4    Period  Weeks    Status  New    Target Date  03/22/19      PT SHORT TERM GOAL #3   Title  Pt will report reduction of Rt hip pain by at least 25% to improve activities such as sleeping on Rt side    Time  4    Period  Weeks    Status  New    Target Date  03/22/19        PT Long Term Goals - 02/22/19 1829      PT LONG TERM GOAL #1   Title  Pt will be I in advanced HEP for functional strength and mobility of upper and lower quadrants to improve quality of life.    Time  8    Period  Weeks    Status  New    Target Date  04/19/19      PT LONG TERM GOAL #2   Title  Pt will reduce FOTO to </= 46% to demo less limitation in daily activities.    Time  8    Period  Weeks    Status  New    Target Date  04/19/19      PT LONG TERM GOAL #3   Title  Pt will report improved sleep by at least 50% due to improved pain in Rt shoudler and hip.    Time  8    Period  Weeks    Status  New    Target Date  04/19/19      PT  LONG TERM GOAL #4   Title  Pt will be able to ambulate community distances with Rt hip pain rating </= 3/10 for tasks like grocery shopping and other errands.    Time  8    Period  Weeks    Status  New    Target Date  04/19/19      PT LONG TERM GOAL #5   Title  Pt will achieve Rt shoulder and hip strength rating of at least 4+/5 to improve functional activities like yard work with less strain on joints.    Time  8    Period  Weeks    Status  New    Target Date  04/19/19            Plan - 02/23/19 1316    Clinical Impression Statement  Pt with first time follow-up after evaluation yesterday.  PT focused on building a HEP for shoulder strength and hip/lumbar strength and flexibility.  Pt required tactile cues with exercise today to reduce substitution.  Pt denies any increased pain with exercise today.  Pt will continue to benefit from skilled PT for hip strength and flexibility and shoulder strength.      Rehab Potential  Excellent    PT Frequency  2x / week    PT Duration  8 weeks    PT Treatment/Interventions  ADLs/Self Care Home Management;Cryotherapy;Electrical Stimulation;Iontophoresis 51m/ml Dexamethasone;Moist Heat;Functional mobility training;Therapeutic activities;Therapeutic exercise;Balance training;Neuromuscular re-education;Patient/family education;Manual techniques;Passive range of motion;Dry needling;Taping;Spinal Manipulations;Joint Manipulations    PT Next Visit Plan  review HEP, dexa patch Rt hip if cert signed, Rt AC joint mob, AA/ROM Rt shoulder, cerv/scap stabilization, core intro    PT Home Exercise Plan  Access Code: [email or web address]  Consulted and Agree with Plan of Care  Patient       Patient will benefit from skilled therapeutic intervention in order to improve the following deficits and impairments:  Pain, Postural dysfunction, Impaired flexibility, Decreased strength, Decreased activity tolerance, Improper body mechanics, Decreased range of motion, Abnormal gait,  Decreased endurance, Decreased mobility, Hypomobility  Visit Diagnosis: Pain in right hip  Chronic right shoulder pain  Abnormal posture  Muscle weakness (generalized)     Problem List Patient Active Problem List   Diagnosis Date Noted  . Chronic right shoulder pain 01/11/2019  . Chronic pain of right hip 01/11/2019  . 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  . Viral warts 01/02/2018  . BPH associated with nocturia 01/29/2017  . Gynecomastia, male 01/29/2017  . Type II diabetes mellitus with complication ([place]) 27/82/9603  . S/P PTCA (percutaneous transluminal coronary angioplasty) 12/29/2015  . NSTEMI (non-ST elevated myocardial infarction) ([place]) 12/29/2015  . Coronary artery disease due to lipid rich plaque 12/29/2015  . Hyperlipidemia LDL goal <70 12/29/2015  . Multinodular goiter 04/12/2015  . Blindness of left eye 04/12/2015    Sigurd Sos, PT 02/23/19 1:21 PM PHYSICAL THERAPY DISCHARGE SUMMARY  Visits from Start of Care: 2  Current functional level related to goals / functional outcomes: See above for current PT status.  Pt was placed on hold during COVID and contacted by PT during this time.  Pt was happy with current HEP and didn't want further updates.  Pt was contacted when clinic opened and was not able to be reached.     Remaining deficits: See above for current PT status.     Education / Equipment: HEP Plan: Patient agrees to discharge.  Patient goals were not met. Patient is being discharged due to not returning since the last visit.  ?????        Sigurd Sos, PT 04/28/19 8:02 AM   [facility] [address] [place], [place], [address] Phone: [phone]   Fax:  [phone]  Name: Alan Lopez MRN: [id number] Date of Birth: 12-15-1945

## 2019-02-23 NOTE — Patient Instructions (Signed)
Access Code: [email or web address]  URL: [email or web address]  Date: 02/23/2019  Prepared by: Sigurd Sos     Seated Hamstring Stretch - 10 reps - 2 sets - 2x daily - 7x weekly Clamshell - 10 reps - 2 sets - 2x daily - 7x weekly Supine Lower Trunk Rotation - 3 reps - 1 sets - 20 hold - 2x daily - 7x weekly Supine Shoulder Horizontal Abduction with Resistance - 10 reps - 2 sets - 2x daily - 7x weekly Supine Shoulder External Rotation on Foam Roll with Theraband - 10 reps - 2 sets - 2x daily - 7x weekly Supine Bridge - 10 reps - 2 sets - 5 hold - 2x daily - 7x weekly Supine Piriformis Stretch - 3 reps - 20 hold - 3x daily - 7x weekly

## 2019-03-16 ENCOUNTER — Ambulatory Visit: Payer: Medicare Other | Admitting: Physical Therapy

## 2019-03-18 ENCOUNTER — Encounter: Payer: Medicare Other | Admitting: Physical Therapy

## 2019-03-23 ENCOUNTER — Other Ambulatory Visit: Payer: Medicare Other

## 2019-03-24 ENCOUNTER — Other Ambulatory Visit: Payer: Self-pay | Admitting: Endocrinology

## 2019-03-26 ENCOUNTER — Ambulatory Visit: Payer: Medicare Other | Admitting: Endocrinology

## 2019-04-13 ENCOUNTER — Encounter: Payer: Self-pay | Admitting: Internal Medicine

## 2019-04-13 ENCOUNTER — Other Ambulatory Visit: Payer: Self-pay | Admitting: Endocrinology

## 2019-04-13 ENCOUNTER — Other Ambulatory Visit: Payer: Self-pay

## 2019-04-13 ENCOUNTER — Other Ambulatory Visit (INDEPENDENT_AMBULATORY_CARE_PROVIDER_SITE_OTHER): Payer: Medicare Other

## 2019-04-13 ENCOUNTER — Ambulatory Visit (INDEPENDENT_AMBULATORY_CARE_PROVIDER_SITE_OTHER): Payer: Medicare Other | Admitting: Internal Medicine

## 2019-04-13 VITALS — BP 132/76 | HR 57 | Temp 98.1°F | Resp 16 | Ht 72.0 in | Wt 210.0 lb

## 2019-04-13 DIAGNOSIS — I10 Essential (primary) hypertension: Secondary | ICD-10-CM

## 2019-04-13 DIAGNOSIS — E118 Type 2 diabetes mellitus with unspecified complications: Secondary | ICD-10-CM | POA: Diagnosis not present

## 2019-04-13 DIAGNOSIS — Z1159 Encounter for screening for other viral diseases: Secondary | ICD-10-CM | POA: Diagnosis not present

## 2019-04-13 DIAGNOSIS — E042 Nontoxic multinodular goiter: Secondary | ICD-10-CM | POA: Diagnosis not present

## 2019-04-13 LAB — BASIC METABOLIC PANEL
BUN: 20 mg/dL (ref 6–23)
CO2: 22 mEq/L (ref 19–32)
Calcium: 8.6 mg/dL (ref 8.4–10.5)
Chloride: 105 mEq/L (ref 96–112)
Creatinine, Ser: 0.93 mg/dL (ref 0.40–1.50)
GFR: 79.39 mL/min (ref >60.00)
Glucose, Bld: 205 mg/dL — ABNORMAL HIGH (ref 70–99)
Potassium: 4.1 mEq/L (ref 3.5–5.1)
Sodium: 135 mEq/L (ref 135–145)

## 2019-04-13 LAB — CBC WITH DIFFERENTIAL/PLATELET
Basophils Absolute: 0.1 10*3/uL (ref 0.0–0.1)
Basophils Relative: 1.1 % (ref 0.0–3.0)
Eosinophils Absolute: 0.3 10*3/uL (ref 0.0–0.7)
Eosinophils Relative: 3.9 % (ref 0.0–5.0)
HCT: 42.8 % (ref 39.0–52.0)
Hemoglobin: 14.9 g/dL (ref 13.0–17.0)
Lymphocytes Relative: 23.4 % (ref 12.0–46.0)
Lymphs Abs: 1.9 10*3/uL (ref 0.7–4.0)
MCHC: 34.7 g/dL (ref 30.0–36.0)
MCV: 94.9 fl (ref 78.0–100.0)
Monocytes Absolute: 0.7 10*3/uL (ref 0.1–1.0)
Monocytes Relative: 8.9 % (ref 3.0–12.0)
Neutro Abs: 5.2 10*3/uL (ref 1.4–7.7)
Neutrophils Relative %: 62.7 % (ref 43.0–77.0)
Platelets: 181 10*3/uL (ref 150.0–400.0)
RBC: 4.51 Mil/uL (ref 4.22–5.81)
RDW: 14.1 % (ref 11.5–15.5)
WBC: 8.3 10*3/uL (ref 4.0–10.5)

## 2019-04-13 LAB — TSH: TSH: 2.51 u[IU]/mL (ref 0.35–4.50)

## 2019-04-13 LAB — HEMOGLOBIN A1C: Hgb A1c MFr Bld: 6.8 % — ABNORMAL HIGH (ref 4.6–6.5)

## 2019-04-13 NOTE — Progress Notes (Signed)
Subjective:  Patient ID: Alan Lopez, male    DOB: 06/01/1946  Age: 73 y.o. MRN: [id number]  CC: Hypertension and Diabetes   HPI Alan Lopez presents for f/up - He feels well today and offers no complaints.  He tells me his blood pressure and blood sugars have been well controlled.  Outpatient Medications Prior to Visit  Medication Sig Dispense Refill  . alfuzosin (UROXATRAL) 10 MG 24 hr tablet Take 10 mg by mouth daily with breakfast.   11  . aspirin EC 81 MG tablet Take 81 mg by mouth daily.    . cholecalciferol (VITAMIN D) 1000 UNITS tablet Take 1,000 Units by mouth daily.    . finasteride (PROSCAR) 5 MG tablet Take 5 mg by mouth daily.  3  . fluticasone (FLONASE) 50 MCG/ACT nasal spray Place 2 sprays into both nostrils daily as needed for allergies (for seasonal allergies). Reported on 01/29/2016    . glucose blood (CONTOUR NEXT TEST) test strip USE TO TEST BLOOD SUGAR 2-3 TIMES DAILY 200 each 2  . Insulin Disposable Pump (V-GO 30) KIT INJECT 4 UNITS AT BREAKFAST, 6 UNITS AT LUNCH, AND 6 UNITS AT DINNER 1 kit 3  . Insulin Pen Needle 32G X 4 MM MISC Use Three per day to inject insulin 100 each 5  . insulin regular (NOVOLIN R,HUMULIN R) 100 units/mL injection Inject into the skin 3 (three) times daily before meals. USE INSULIN TO FILL UP v-GO PUMP    . Lancets (FREESTYLE) lancets Use to test blood sugar once daily Dx code E11.8 100 each 12  . losartan (COZAAR) 25 MG tablet Take 0.5 tablets (12.5 mg total) by mouth daily. 45 tablet 3  . meloxicam (MOBIC) 7.5 MG tablet Take 1 tablet (7.5 mg total) by mouth daily. 90 tablet 1  . metFORMIN (GLUCOPHAGE-XR) 500 MG 24 hr tablet Take 1 tablet (500 mg total) by mouth 2 (two) times daily. 180 tablet 3  . metoprolol tartrate (LOPRESSOR) 25 MG tablet Take 0.5 tablets (12.5 mg total) by mouth 2 (two) times daily. 90 tablet 2  . rosuvastatin (CRESTOR) 20 MG tablet Take 1/2 to 1 tablet by mouth daily as tolerated. 90 tablet 2  . pioglitazone  (ACTOS) 15 MG tablet TAKE 1 TABLET BY MOUTH DAILY 90 tablet 0  . metFORMIN (GLUCOPHAGE) 500 MG tablet TAKE 2 TABLETS IN THE MORNING WITH A MEAL AND 1 TABLET IN THE EVENING WITH A MEAL. (Patient not taking: Reported on 04/13/2019) 270 tablet 0   No facility-administered medications prior to visit.     ROS Review of Systems  Constitutional: Negative.  Negative for diaphoresis, fatigue and unexpected weight change.  HENT: Negative.  Negative for trouble swallowing.   Eyes: Negative for visual disturbance.  Respiratory: Negative for cough, chest tightness and wheezing.   Cardiovascular: Negative for chest pain, palpitations and leg swelling.  Gastrointestinal: Negative for abdominal pain, constipation, diarrhea, nausea and vomiting.  Endocrine: Negative for cold intolerance, heat intolerance, polydipsia, polyphagia and polyuria.  Genitourinary: Negative.  Negative for difficulty urinating.  Musculoskeletal: Positive for arthralgias. Negative for myalgias.  Skin: Negative.  Negative for color change.  Neurological: Negative.  Negative for dizziness, weakness, light-headedness and headaches.  Hematological: Negative for adenopathy. Does not bruise/bleed easily.  Psychiatric/Behavioral: Negative.     Objective:  BP 132/76 (BP Location: Left Arm, Patient Position: Sitting, Cuff Size: Large)   Pulse (!) 57   Temp 98.1 F (36.7 C) (Oral)   Resp 16   Ht 6' (1.829  m)   Wt 210 lb (95.3 kg)   SpO2 94%   BMI 28.48 kg/m   BP Readings from Last 3 Encounters:  04/13/19 132/76  02/03/19 (!) 154/78  01/11/19 134/68    Wt Readings from Last 3 Encounters:  04/13/19 210 lb (95.3 kg)  02/03/19 210 lb (95.3 kg)  01/11/19 208 lb (94.3 kg)    Physical Exam Vitals signs reviewed.  Constitutional:      Appearance: He is not ill-appearing or diaphoretic.  HENT:     Nose: Nose normal. No congestion.     Mouth/Throat:     Mouth: Mucous membranes are moist.     Pharynx: Oropharynx is clear. No  oropharyngeal exudate or posterior oropharyngeal erythema.  Eyes:     General: No scleral icterus.    Conjunctiva/sclera: Conjunctivae normal.  Neck:     Musculoskeletal: Normal range of motion and neck supple. No muscular tenderness.  Cardiovascular:     Rate and Rhythm: Normal rate and regular rhythm.     Heart sounds: No murmur.  Pulmonary:     Effort: Pulmonary effort is normal. No respiratory distress.     Breath sounds: No stridor. No wheezing, rhonchi or rales.  Abdominal:     General: Abdomen is flat.     Palpations: There is no mass.     Tenderness: There is no abdominal tenderness. There is no guarding.  Musculoskeletal: Normal range of motion.     Right lower leg: No edema.  Lymphadenopathy:     Cervical: No cervical adenopathy.  Skin:    General: Skin is warm and dry.     Findings: No rash.  Neurological:     General: No focal deficit present.     Lab Results  Component Value Date   WBC 8.3 04/13/2019   HGB 14.9 04/13/2019   HCT 42.8 04/13/2019   PLT 181.0 04/13/2019   GLUCOSE 205 (H) 04/13/2019   CHOL 120 01/18/2019   TRIG 68 01/18/2019   HDL 67 01/18/2019   LDLCALC 39 01/18/2019   ALT 37 01/18/2019   AST 25 01/18/2019   NA 135 04/13/2019   K 4.1 04/13/2019   CL 105 04/13/2019   CREATININE 0.93 04/13/2019   BUN 20 04/13/2019   CO2 22 04/13/2019   TSH 2.51 04/13/2019   HGBA1C 6.8 (H) 04/13/2019   MICROALBUR 1.5 09/15/2018    Dg Shoulder Right  Result Date: 01/11/2019 CLINICAL DATA:  Chronic right shoulder pain. EXAM: RIGHT SHOULDER - 2+ VIEW COMPARISON:  None. FINDINGS: No acute fracture or dislocation. Mild acromioclavicular joint space narrowing. The glenohumeral joint space is relatively preserved. Osteopenia. Soft tissues are unremarkable. IMPRESSION: 1. Mild acromioclavicular osteoarthritis. Electronically Signed   By: Titus Dubin M.D.   On: 01/11/2019 17:23   Dg Hip Unilat With Pelvis 2-3 Views Right  Result Date: 01/11/2019 CLINICAL DATA:   Chronic right hip pain. EXAM: DG HIP (WITH OR WITHOUT PELVIS) 2-3V RIGHT COMPARISON:  None. FINDINGS: There are tiny marginal osteophytes on the femoral head. There are enthesophytes at the gluteal tendon insertions on the greater trochanter of the proximal right femur. Pelvic bones appear normal. Joint spaces preserved. IMPRESSION: Minimal degenerative changes of the right hip as described. No acute abnormalities. Electronically Signed   By: Lorriane Shire M.D.   On: 01/11/2019 17:23    Assessment & Plan:   Krystal was seen today for hypertension and diabetes.  Diagnoses and all orders for this visit:  Type II diabetes mellitus with complication (  [place])- His A1c is at 6.8%.  His blood sugars are adequately well controlled. -     Basic metabolic panel; Future -     Hemoglobin A1c; Future  Multinodular goiter- He is euthyroid.  Palpation of the gland is normal.  His TSH is in the normal range. -     TSH; Future  Essential hypertension- His blood pressure is adequately well controlled.  Electrolytes and renal function are normal. -     CBC with Differential/Platelet; Future -     Basic metabolic panel; Future -     TSH; Future  Need for hepatitis C screening test -     Hepatitis C antibody; Future   I am having [name] maintain his finasteride, cholecalciferol, aspirin EC, fluticasone, Insulin Pen Needle, freestyle, insulin regular, alfuzosin, losartan, glucose blood, rosuvastatin, metoprolol tartrate, metFORMIN, meloxicam, and V-Go 30.  No orders of the defined types were placed in this encounter.    Follow-up: Return in about 6 months (around 10/14/2019).  [name], MD

## 2019-04-13 NOTE — Patient Instructions (Signed)
Type 2 Diabetes Mellitus, Diagnosis, Adult Type 2 diabetes (type 2 diabetes mellitus) is a long-term (chronic) disease. In type 2 diabetes, one or both of these problems may be present:  The pancreas does not make enough of a hormone called insulin.  Cells in the body do not respond properly to insulin that the body makes (insulin resistance). Normally, insulin allows blood sugar (glucose) to enter cells in the body. The cells use glucose for energy. Insulin resistance or lack of insulin causes excess glucose to build up in the blood instead of going into cells. As a result, high blood glucose (hyperglycemia) develops. What increases the risk? The following factors may make you more likely to develop type 2 diabetes:  Having a family member with type 2 diabetes.  Being overweight or obese.  Having an inactive (sedentary) lifestyle.  Having been diagnosed with insulin resistance.  Having a history of prediabetes, gestational diabetes, or polycystic ovary syndrome (PCOS).  Being of American-Indian, African-American, Hispanic/Latino, or Asian/Pacific Islander descent. What are the signs or symptoms? In the early stage of this condition, you may not have symptoms. Symptoms develop slowly and may include:  Increased thirst (polydipsia).  Increased hunger(polyphagia).  Increased urination (polyuria).  Increased urination during the night (nocturia).  Unexplained weight loss.  Frequent infections that keep coming back (recurring).  Fatigue.  Weakness.  Vision changes, such as blurry vision.  Cuts or bruises that are slow to heal.  Tingling or numbness in the hands or feet.  Dark patches on the skin (acanthosis nigricans). How is this diagnosed? This condition is diagnosed based on your symptoms, your medical history, a physical exam, and your blood glucose level. Your blood glucose may be checked with one or more of the following blood tests:  A fasting blood glucose (FBG)  test. You will not be allowed to eat (you will fast) for 8 hours or longer before a blood sample is taken.  A random blood glucose test. This test checks blood glucose at any time of day regardless of when you ate.  An A1c (hemoglobin A1c) blood test. This test provides information about blood glucose control over the previous 2-3 months.  An oral glucose tolerance test (OGTT). This test measures your blood glucose at two times: ? After fasting. This is your baseline blood glucose level. ? Two hours after drinking a beverage that contains glucose. You may be diagnosed with type 2 diabetes if:  Your FBG level is 126 mg/dL (7.0 mmol/L) or higher.  Your random blood glucose level is 200 mg/dL (11.1 mmol/L) or higher.  Your A1c level is 6.5% or higher.  Your OGTT result is higher than 200 mg/dL (11.1 mmol/L). These blood tests may be repeated to confirm your diagnosis. How is this treated? Your treatment may be managed by a specialist called an endocrinologist. Type 2 diabetes may be treated by following instructions from your health care provider about:  Making diet and lifestyle changes. This may include: ? Following an individualized nutrition plan that is developed by a diet and nutrition specialist (registered dietitian). ? Exercising regularly. ? Finding ways to manage stress.  Checking your blood glucose level as often as told.  Taking diabetes medicines or insulin daily. This helps to keep your blood glucose levels in the healthy range. ? If you use insulin, you may need to adjust the dosage depending on how physically active you are and what foods you eat. Your health care provider will tell you how to adjust your dosage.    Taking medicines to help prevent complications from diabetes, such as: ? Aspirin. ? Medicine to lower cholesterol. ? Medicine to control blood pressure. Your health care provider will set individualized treatment goals for you. Your goals will be based on  your age, other medical conditions you have, and how you respond to diabetes treatment. Generally, the goal of treatment is to maintain the following blood glucose levels:  Before meals (preprandial): 80-130 mg/dL (4.4-7.2 mmol/L).  After meals (postprandial): below 180 mg/dL (10 mmol/L).  A1c level: less than 7%. Follow these instructions at home: Questions to ask your health care provider  Consider asking the following questions: ? Do I need to meet with a diabetes educator? ? Where can I find a support group for people with diabetes? ? What equipment will I need to manage my diabetes at home? ? What diabetes medicines do I need, and when should I take them? ? How often do I need to check my blood glucose? ? What number can I call if I have questions? ? When is my next appointment? General instructions  Take over-the-counter and prescription medicines only as told by your health care provider.  Keep all follow-up visits as told by your health care provider. This is important.  For more information about diabetes, visit: ? American Diabetes Association (ADA): www.diabetes.org ? American Association of Diabetes Educators (AADE): www.diabeteseducator.org Contact a health care provider if:  Your blood glucose is at or above 240 mg/dL (13.3 mmol/L) for 2 days in a row.  You have been sick or have had a fever for 2 days or longer, and you are not getting better.  You have any of the following problems for more than 6 hours: ? You cannot eat or drink. ? You have nausea and vomiting. ? You have diarrhea. Get help right away if:  Your blood glucose is lower than 54 mg/dL (3.0 mmol/L).  You become confused or you have trouble thinking clearly.  You have difficulty breathing.  You have moderate or large ketone levels in your urine. Summary  Type 2 diabetes (type 2 diabetes mellitus) is a long-term (chronic) disease. In type 2 diabetes, the pancreas does not make enough of a  hormone called insulin, or cells in the body do not respond properly to insulin that the body makes (insulin resistance).  This condition is treated by making diet and lifestyle changes and taking diabetes medicines or insulin.  Your health care provider will set individualized treatment goals for you. Your goals will be based on your age, other medical conditions you have, and how you respond to diabetes treatment.  Keep all follow-up visits as told by your health care provider. This is important. This information is not intended to replace advice given to you by your health care provider. Make sure you discuss any questions you have with your health care provider. Document Released: 11/25/2005 Document Revised: 06/26/2017 Document Reviewed: 12/29/2015 [employer] Interactive Patient Education  2019 [employer]  

## 2019-04-14 LAB — HEPATITIS C ANTIBODY
Hepatitis C Ab: NONREACTIVE
SIGNAL TO CUT-OFF: 0.01 (ref <1.00)

## 2019-04-25 ENCOUNTER — Other Ambulatory Visit: Payer: Self-pay | Admitting: Endocrinology

## 2019-05-19 ENCOUNTER — Other Ambulatory Visit: Payer: Self-pay | Admitting: Endocrinology

## 2019-05-25 ENCOUNTER — Other Ambulatory Visit: Payer: Self-pay | Admitting: Endocrinology

## 2019-06-08 ENCOUNTER — Other Ambulatory Visit: Payer: Self-pay | Admitting: Internal Medicine

## 2019-06-08 DIAGNOSIS — M159 Polyosteoarthritis, unspecified: Secondary | ICD-10-CM

## 2019-06-24 ENCOUNTER — Other Ambulatory Visit: Payer: Self-pay | Admitting: Endocrinology

## 2019-07-22 ENCOUNTER — Other Ambulatory Visit: Payer: Self-pay | Admitting: Endocrinology

## 2019-07-22 ENCOUNTER — Other Ambulatory Visit: Payer: Self-pay | Admitting: Cardiology

## 2019-07-25 ENCOUNTER — Other Ambulatory Visit: Payer: Self-pay | Admitting: Cardiology

## 2019-07-25 DIAGNOSIS — I214 Non-ST elevation (NSTEMI) myocardial infarction: Secondary | ICD-10-CM

## 2019-07-25 DIAGNOSIS — I2510 Atherosclerotic heart disease of native coronary artery without angina pectoris: Secondary | ICD-10-CM

## 2019-07-25 DIAGNOSIS — I2583 Coronary atherosclerosis due to lipid rich plaque: Secondary | ICD-10-CM

## 2019-07-25 DIAGNOSIS — Z9861 Coronary angioplasty status: Secondary | ICD-10-CM

## 2019-07-25 DIAGNOSIS — E785 Hyperlipidemia, unspecified: Secondary | ICD-10-CM

## 2019-07-25 DIAGNOSIS — Z789 Other specified health status: Secondary | ICD-10-CM

## 2019-08-03 ENCOUNTER — Other Ambulatory Visit: Payer: Self-pay | Admitting: Endocrinology

## 2019-08-05 ENCOUNTER — Other Ambulatory Visit: Payer: Self-pay

## 2019-08-05 ENCOUNTER — Other Ambulatory Visit (INDEPENDENT_AMBULATORY_CARE_PROVIDER_SITE_OTHER): Payer: Medicare Other

## 2019-08-05 DIAGNOSIS — E1165 Type 2 diabetes mellitus with hyperglycemia: Secondary | ICD-10-CM | POA: Diagnosis not present

## 2019-08-05 DIAGNOSIS — Z794 Long term (current) use of insulin: Secondary | ICD-10-CM

## 2019-08-05 LAB — COMPREHENSIVE METABOLIC PANEL
ALT: 27 U/L (ref 0–53)
AST: 24 U/L (ref 0–37)
Albumin: 4.1 g/dL (ref 3.5–5.2)
Alkaline Phosphatase: 48 U/L (ref 39–117)
BUN: 21 mg/dL (ref 6–23)
CO2: 24 mEq/L (ref 19–32)
Calcium: 8.9 mg/dL (ref 8.4–10.5)
Chloride: 105 mEq/L (ref 96–112)
Creatinine, Ser: 0.89 mg/dL (ref 0.40–1.50)
GFR: 83.45 mL/min (ref >60.00)
Glucose, Bld: 153 mg/dL — ABNORMAL HIGH (ref 70–99)
Potassium: 4.1 mEq/L (ref 3.5–5.1)
Sodium: 137 mEq/L (ref 135–145)
Total Bilirubin: 0.7 mg/dL (ref 0.2–1.2)
Total Protein: 6.7 g/dL (ref 6.0–8.3)

## 2019-08-05 LAB — HEMOGLOBIN A1C: Hgb A1c MFr Bld: 6.5 % (ref 4.6–6.5)

## 2019-08-09 ENCOUNTER — Other Ambulatory Visit: Payer: Self-pay

## 2019-08-09 ENCOUNTER — Ambulatory Visit (INDEPENDENT_AMBULATORY_CARE_PROVIDER_SITE_OTHER): Payer: Medicare Other | Admitting: Endocrinology

## 2019-08-09 ENCOUNTER — Encounter: Payer: Self-pay | Admitting: Endocrinology

## 2019-08-09 VITALS — BP 142/60 | HR 62 | Ht 72.0 in | Wt 213.2 lb

## 2019-08-09 DIAGNOSIS — Z794 Long term (current) use of insulin: Secondary | ICD-10-CM

## 2019-08-09 DIAGNOSIS — E1165 Type 2 diabetes mellitus with hyperglycemia: Secondary | ICD-10-CM | POA: Diagnosis not present

## 2019-08-09 DIAGNOSIS — I2583 Coronary atherosclerosis due to lipid rich plaque: Secondary | ICD-10-CM

## 2019-08-09 DIAGNOSIS — E1142 Type 2 diabetes mellitus with diabetic polyneuropathy: Secondary | ICD-10-CM

## 2019-08-09 DIAGNOSIS — E7800 Pure hypercholesterolemia, unspecified: Secondary | ICD-10-CM

## 2019-08-09 DIAGNOSIS — I2510 Atherosclerotic heart disease of native coronary artery without angina pectoris: Secondary | ICD-10-CM | POA: Diagnosis not present

## 2019-08-09 MED ORDER — FREESTYLE LANCETS MISC: 1 refills | Status: DC

## 2019-08-09 MED ORDER — PIOGLITAZONE HCL 15 MG PO TABS: 15.0000 mg | ORAL_TABLET | Freq: Every day | ORAL | 1 refills | Status: DC

## 2019-08-09 MED ORDER — INSULIN PEN NEEDLE 32G X 4 MM MISC: 1 refills | Status: AC

## 2019-08-09 MED ORDER — CONTOUR NEXT TEST VI STRP: ORAL_STRIP | 1 refills | Status: DC

## 2019-08-09 MED ORDER — V-GO 30 KIT: 1.0000 | PACK | Freq: Every day | 1 refills | Status: DC

## 2019-08-09 MED ORDER — METFORMIN HCL ER 500 MG PO TB24: 500.0000 mg | ORAL_TABLET | Freq: Two times a day (BID) | ORAL | 1 refills | Status: DC

## 2019-08-09 NOTE — Progress Notes (Signed)
Patient ID: Alan Lopez, male   DOB: 11/02/1946, 73 y.o.   MRN: [id number]            Reason for Appointment: Follow-up for Type 2 Diabetes   History of Present Illness:          Date of diagnosis of type 2 diabetes mellitus :  1999      Past history:  He had modestly increase blood sugars at diagnosis and was started on metformin Subsequently glipizide was added to improve his control. He thinks that his blood sugars have been mostly poorly controlled over the last several years He had been on glipizide and metformin for years without any adjustment of his dosage of glipizide  Review of his A1c shows that it had ranged from 9-9.5 since 07/2013 Previously his PCP tried him on a sample of Jardiance 10 mg without significant improvement  He was started on the V-go pump on 07/02/17 because of inconsistent control and poor compliance with mealtime insulin  Recent history:   Oral hypoglycemic drugs the patient is taking are: Actos 15 mg, metformin ER 500 mg, 2 tablets in the morning and 1 in the evening  Current INSULIN regimen: V-go pump SETTINGS: Basal rate = 30 units BOLUSES = 3-4 units at breakfast, 8-10 units at lunch and  10 at dinner  His A1c is 6.5, previously 6.8, has been as high as 8.9  His last visit was in 1/20  Current blood sugar patterns and problems identified:  He did bring his monitor for download today  Although he was told to call and increase his pioglitazone to 30 mg to help with postprandial hyperglycemia but he did not do so  He is also not taking extended release Metformin again instead of regular metformin  With this his diarrhea has better and tolerable  However he is asking about recent Internet reports regarding metformin and dementia  As before he forgets to check his readings after meals and has not done many as previously instructed  His blood sugars are late at night at about 11 PM and usually about 160  Highest reading 204 at bedtime  and he apparently did take an extra click for this but he got low at the same night at 4 AM  Occasionally he is eating a small meal in the evening he may feel little hypoglycemic overnight but has only 1 documented low sugar of 55  He is adjusting his boluses based on meal size and carbohydrate intake  Often trying to reduce or eliminate bread and starchy foods, with hamburgers he will usually not eat the bun  He is not doing a lot of formal exercise, just some yard work and at times walking  However his weight has gone up 3 pounds  He is changing his pump consistently at the same time in the morning usually  Side effects from medications have been: Mild  diarrhea from regular metformin  Compliance with the medical regimen: fair   Hypoglycemia: never   Glucose monitoring:  done once a day usually       Glucometer:   Contour    PRE-MEAL  mornings Lunch Dinner Bedtime Overall  Glucose range:  104-178    164-206   Mean/median:  136    177      Self-care: The diet that the patient has been following is: tries to limit high carbohydrate foods. eating out about 4 times a week     Typical meal intake: Breakfast is  cereal/meat .  Usually 2 servings of vegetables in the evening and avoiding potatoes and bread usually Usually avoiding drinks with sugar                Dietician visit, most recent: 05/2015  CDE visit: 01/2016  Weight history: Maximum weight 215 previously  Wt Readings from Last 3 Encounters:  08/09/19 213 lb 3.2 oz (96.7 kg)  04/13/19 210 lb (95.3 kg)  02/03/19 210 lb (95.3 kg)    Glycemic control:    Lab Results  Component Value Date   HGBA1C 6.5 08/05/2019   HGBA1C 6.8 (H) 04/13/2019   HGBA1C 6.8 (H) 12/15/2018   Lab Results  Component Value Date   MICROALBUR 1.5 09/15/2018   LDLCALC 39 01/18/2019   CREATININE 0.89 08/05/2019    Lab on 08/05/2019  Component Date Value Ref Range Status  . Sodium 08/05/2019 137  135 - 145 mEq/L Final  . Potassium  08/05/2019 4.1  3.5 - 5.1 mEq/L Final  . Chloride 08/05/2019 105  96 - 112 mEq/L Final  . CO2 08/05/2019 24  19 - 32 mEq/L Final  . Glucose, Bld 08/05/2019 153* 70 - 99 mg/dL Final  . BUN 08/05/2019 21  6 - 23 mg/dL Final  . Creatinine, Ser 08/05/2019 0.89  0.40 - 1.50 mg/dL Final  . Total Bilirubin 08/05/2019 0.7  0.2 - 1.2 mg/dL Final  . Alkaline Phosphatase 08/05/2019 48  39 - 117 U/L Final  . AST 08/05/2019 24  0 - 37 U/L Final  . ALT 08/05/2019 27  0 - 53 U/L Final  . Total Protein 08/05/2019 6.7  6.0 - 8.3 g/dL Final  . Albumin 08/05/2019 4.1  3.5 - 5.2 g/dL Final  . Calcium 08/05/2019 8.9  8.4 - 10.5 mg/dL Final  . GFR 08/05/2019 83.45  >60.00 mL/min Final  . Hgb A1c MFr Bld 08/05/2019 6.5  4.6 - 6.5 % Final   Glycemic Control Guidelines for People with Diabetes:Non Diabetic:  <6%Goal of Therapy: <7%Additional Action Suggested:  >8%       Allergies as of 08/09/2019      Reactions   Atorvastatin Other (See Comments)   Codeine Other (See Comments)   Patient doesn't like how it makes him feel, "spaced out, not together"      Medication List       Accurate as of August 09, 2019 11:31 AM. If you have any questions, ask your nurse or doctor.        alfuzosin 10 MG 24 hr tablet Commonly known as: UROXATRAL Take 10 mg by mouth daily with breakfast.   aspirin EC 81 MG tablet Take 81 mg by mouth daily.   cholecalciferol 1000 units tablet Commonly known as: VITAMIN D Take 1,000 Units by mouth daily.   finasteride 5 MG tablet Commonly known as: PROSCAR Take 5 mg by mouth daily.   fluticasone 50 MCG/ACT nasal spray Commonly known as: FLONASE Place 2 sprays into both nostrils daily as needed for allergies (for seasonal allergies). Reported on 01/29/2016   freestyle lancets Use to test blood sugar once daily Dx code E11.8   glucose blood test strip Commonly known as: Contour Next Test USE TO TEST BLOOD SUGAR 2-3 TIMES DAILY   Insulin Pen Needle 32G X 4 MM Misc Use  Three per day to inject insulin   insulin regular 100 units/mL injection Commonly known as: NOVOLIN R Inject into the skin 3 (three) times daily before meals. USE INSULIN TO FILL UP v-GO  PUMP   losartan 25 MG tablet Commonly known as: COZAAR TAKE ONE HALF TABLET BY MOUTH DAILY   meloxicam 7.5 MG tablet Commonly known as: MOBIC TAKE ONE TABLET BY MOUTH DAILY   metFORMIN 500 MG 24 hr tablet Commonly known as: GLUCOPHAGE-XR Take 1 tablet (500 mg total) by mouth 2 (two) times daily.   metoprolol tartrate 25 MG tablet Commonly known as: LOPRESSOR TAKE ONE-HALF TABLET BY MOUTH TWICE A DAY   pioglitazone 15 MG tablet Commonly known as: ACTOS TAKE ONE TABLET BY MOUTH DAILY   rosuvastatin 20 MG tablet Commonly known as: CRESTOR TAKE ONE-HALF TO ONE TABLET BY MOUTH BY MOUTH DAILY AS TOLERATED   V-Go 30 Kit Inject 1 each into the skin daily. Apply 1 vgo pump to body once daily for insulin infusion.       Allergies:  Allergies  Allergen Reactions  . Atorvastatin Other (See Comments)  . Codeine Other (See Comments)    Patient doesn't like how it makes him feel, "spaced out, not together"    Past Medical History:  Diagnosis Date  . 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 HTN (hypertension)   . Hypertrophy (benign) of prostate   . Multinodular goiter 04/12/2015  . Non-insulin dependent type 2 diabetes mellitus ([place])   . Status post non-ST elevation myocardial infarction (NSTEMI)    CATH  . Tobacco abuse   . Type II diabetes mellitus, uncontrolled ([place]) 04/12/2015    Past Surgical History:  Procedure Laterality Date  . CORONARY ANGIOPLASTY WITH STENT PLACEMENT      Family History  Problem Relation Age of Onset  . Diabetes Mother   . Diabetes Brother   . Cancer Brother   . Heart disease Neg Hx     Social History:  reports that he has been smoking cigarettes. He has never used smokeless tobacco. He reports current alcohol use of about 10.0 standard drinks of alcohol per  week. He reports that he does not use drugs.    Review of Systems     Lipid history:   Has history of CAD Followed by cardiologist, since his MI he has been on 80 mg Lipitor Labs as follows:   Lab Results  Component Value Date   CHOL 120 01/18/2019   HDL 67 01/18/2019   LDLCALC 39 01/18/2019   TRIG 68 01/18/2019   CHOLHDL 1.8 01/18/2019           Eyes: He has had blindness in his left eye from old injury   His last eye exam was in 12/2018  THYROID: Previously was found to have a 2 cm nodule on his left thyroid on his exam but ultrasound in 04/2015 showed multinodular goiter with mostly small nodules and the largest nodule on the left side of 1.4 cm has the appearance of a pseudo- nodule  Thyroid levels have been normal  Lab Results  Component Value Date   TSH 2.51 04/13/2019   NEUROPATHY: He is asking about feeling of numbness on the soles of his feet when he touches them He has no numbness in the toes, only rarely will have any tingling or paresthesia His last foot exam by PCP did not show any sensory loss  No difficulty with calf pain on walking   LABS:  Lab on 08/05/2019  Component Date Value Ref Range Status  . Sodium 08/05/2019 137  135 - 145 mEq/L Final  . Potassium 08/05/2019 4.1  3.5 - 5.1 mEq/L Final  . Chloride 08/05/2019 105  96 - 112 mEq/L Final  . CO2 08/05/2019 24  19 - 32 mEq/L Final  . Glucose, Bld 08/05/2019 153* 70 - 99 mg/dL Final  . BUN 08/05/2019 21  6 - 23 mg/dL Final  . Creatinine, Ser 08/05/2019 0.89  0.40 - 1.50 mg/dL Final  . Total Bilirubin 08/05/2019 0.7  0.2 - 1.2 mg/dL Final  . Alkaline Phosphatase 08/05/2019 48  39 - 117 U/L Final  . AST 08/05/2019 24  0 - 37 U/L Final  . ALT 08/05/2019 27  0 - 53 U/L Final  . Total Protein 08/05/2019 6.7  6.0 - 8.3 g/dL Final  . Albumin 08/05/2019 4.1  3.5 - 5.2 g/dL Final  . Calcium 08/05/2019 8.9  8.4 - 10.5 mg/dL Final  . GFR 08/05/2019 83.45  >60.00 mL/min Final  . Hgb A1c MFr Bld  08/05/2019 6.5  4.6 - 6.5 % Final   Glycemic Control Guidelines for People with Diabetes:Non Diabetic:  <6%Goal of Therapy: <7%Additional Action Suggested:  >8%     Physical Examination:  BP (!) 142/60 (BP Location: Right Arm, Patient Position: Sitting, Cuff Size: Normal)   Pulse 62   Ht 6' (1.829 m)   Wt 213 lb 3.2 oz (96.7 kg)   SpO2 94%   BMI 28.92 kg/m      ASSESSMENT:  Diabetes type 2, on insulin  See history of present illness for detailed discussion of his current management, blood sugar patterns and problems identified  A1c is 6.5 which is the best in quite some time   He is being seen back in follow-up after several months His records and history were reviewed in detail  Blood sugar download from his meter was not done and patterns discussed with the patient from his print out As discussed above his readings are not being checked after meals which is more important and stressed the need for doing so However with his cutting back on carbohydrate intake and generally staying active he has had fairly good control Blood sugar variability is present likely to be from using generic regular insulin  Only hypoglycemia will occur if he has a very light meal in the evening or takes extra boluses at bedtime for correcting high readings related to increased carbohydrates or a richer meal  Mild neuropathy: He has some subjective numbness on the bottom of his feet and likely to be from mild neuropathy Does not want symptomatic treatment  LIPIDS: Followed by cardiologist and will need follow-up  PLAN:    He will stay on the V-go pump  Continue same dose of Actos and metformin  Explained to him that there is no proven effect of metformin on memory function  Since he has no significant diarrhea we will continue the same dose of metformin  Discussed postprandial blood sugars and how this would be useful to manage his diabetes  He will not do any correction for any high  readings at bedtime to avoid hypoglycemia because of using regular insulin  Stay on the V-go pump 30 units, discussed that fasting readings may be variable because of regular insulin and does not need to check these every day  He will have his influenza vaccine at the drugstore in October  Call if having more symptoms with neuropathy   Counseling time on subjects discussed in assessment and plan sections is over 50% of today's 25 minute visit  There are no Patient Instructions on file for this visit.     Elayne Snare 08/09/2019, 11:31 AM  Note: This office note was prepared with Dragon voice recognition system technology. Any transcriptional errors that result from this process are unintentional.  

## 2019-08-09 NOTE — Patient Instructions (Signed)
Check blood sugars on waking up days a week  Also check blood sugars about 2 hours after meals and do this after different meals by rotation  Recommended blood sugar levels on waking up are 90-130 and about 2 hours after meal is 130-170  Please bring your blood sugar monitor to each visit, thank you  No clicks at bedtime

## 2019-09-30 DIAGNOSIS — R972 Elevated prostate specific antigen [PSA]: Secondary | ICD-10-CM | POA: Diagnosis not present

## 2019-09-30 DIAGNOSIS — N400 Enlarged prostate without lower urinary tract symptoms: Secondary | ICD-10-CM | POA: Diagnosis not present

## 2019-10-05 ENCOUNTER — Ambulatory Visit (INDEPENDENT_AMBULATORY_CARE_PROVIDER_SITE_OTHER): Payer: Medicare Other | Admitting: Cardiology

## 2019-10-05 ENCOUNTER — Other Ambulatory Visit: Payer: Self-pay

## 2019-10-05 ENCOUNTER — Encounter: Payer: Self-pay | Admitting: Cardiology

## 2019-10-05 VITALS — BP 142/70 | HR 55 | Ht 72.0 in | Wt 219.0 lb

## 2019-10-05 DIAGNOSIS — I2583 Coronary atherosclerosis due to lipid rich plaque: Secondary | ICD-10-CM | POA: Diagnosis not present

## 2019-10-05 DIAGNOSIS — R001 Bradycardia, unspecified: Secondary | ICD-10-CM | POA: Diagnosis not present

## 2019-10-05 DIAGNOSIS — Z23 Encounter for immunization: Secondary | ICD-10-CM

## 2019-10-05 DIAGNOSIS — I2510 Atherosclerotic heart disease of native coronary artery without angina pectoris: Secondary | ICD-10-CM

## 2019-10-05 MED ORDER — ROSUVASTATIN CALCIUM 10 MG PO TABS: 10.0000 mg | ORAL_TABLET | Freq: Every day | ORAL | 11 refills | Status: DC

## 2019-10-05 NOTE — Patient Instructions (Signed)
Medication Instructions:  Please discontinue your Metoprolol. Decrease Crestor to 10 mg daily and continue all other medications as listed.  *If you need a refill on your cardiac medications before your next appointment, please call your pharmacy*  Follow-Up: At [facility], you and your health needs are our priority.  As part of our continuing mission to provide you with exceptional heart care, we have created designated Provider Care Teams.  These Care Teams include your primary Cardiologist (physician) and Advanced Practice Providers (APPs -  Physician Assistants and Nurse Practitioners) who all work together to provide you with the care you need, when you need it.  Your next appointment:   12 months  The format for your next appointment:   In Person  Provider:   You may see Candee Furbish, MD or one of the following Advanced Practice Providers on your designated Care Team:    Truitt Merle, NP  Cecilie Kicks, NP  Kathyrn Drown, NP   Thank you for choosing [facility]!!

## 2019-10-05 NOTE — Progress Notes (Signed)
Cardiology Office Note:    Date:  10/05/2019   ID:  Alan Lopez, DOB [birth year], MRN [id number]  PCP:  Alan Lima, MD  Cardiologist:  Alan Furbish, MD  Electrophysiologist:  None   Referring MD: Alan Lima, MD     History of Present Illness:    Alan Lopez is a 73 y.o. male with CAD, DES to right coronary artery, prior MI, hypertension, smoker, hyperlipidemia with diabetes here for follow-up.  Previously described left-sided chest discomfort and was seen at [facility] by Dr. Alla Lopez.  He was having the symptoms and underwent catheterization on 12/12/2015 where he had severe disease of his right coronary artery and successful DES placement to this vessel.  He did have transient no reflow.  LVEDP was 16, EF was 45-50 with inferior wall hypokinesis.  He ended up having significant dyspnea on Brilinta, change to Plavix [name] is his neighbor.  His wife has died from ovarian/omental cancer 10/2018.  He now has been having some full body muscle aches thinks it may be either his blood pressure pills or cholesterol medication and he was wondering if he could may be switch this.  He is currently taking atorvastatin 40 mg once a day.  Metoprolol and losartan as well.  His LDL was 28. He wonders as well if this is just old age.  He also states that he is having some trigger finger issues as well.  He has a lesion on his arm that he would like to have dermatology look at.  10/05/2019-here for coronary artery disease follow-up.  Overall he has been doing very well.  Unfortunately he lost his wife to omental cancer in November 2019.  Denies any fevers chills nausea vomiting syncope bleeding.  He has had some lower than normal heart rates he states.  Sometimes is hard to get his heart rate up for activity.  He is on very low-dose metoprolol.  We will stop.  Will consolidate his other medications so he does have to split them.  Talked about  computer programming.  Past Medical History:  Diagnosis Date  . 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 HTN (hypertension)   . Hypertrophy (benign) of prostate   . Multinodular goiter 04/12/2015  . Non-insulin dependent type 2 diabetes mellitus ([name])   . Status post non-ST elevation myocardial infarction (NSTEMI)    CATH  . Tobacco abuse   . Type II diabetes mellitus, uncontrolled ([name]) 04/12/2015    Past Surgical History:  Procedure Laterality Date  . CORONARY ANGIOPLASTY WITH STENT PLACEMENT      Current Medications: Current Meds  Medication Sig  . alfuzosin (UROXATRAL) 10 MG 24 hr tablet Take 10 mg by mouth daily with breakfast.   . aspirin EC 81 MG tablet Take 81 mg by mouth daily.  . cholecalciferol (VITAMIN D) 1000 UNITS tablet Take 1,000 Units by mouth daily.  . finasteride (PROSCAR) 5 MG tablet Take 5 mg by mouth daily.  . fluticasone (FLONASE) 50 MCG/ACT nasal spray Place 2 sprays into both nostrils daily as needed for allergies (for seasonal allergies). Reported on 01/29/2016  . glucose blood (CONTOUR NEXT TEST) test strip USE TO TEST BLOOD SUGAR 2-3 TIMES DAILY  . Insulin Disposable Pump (V-GO 30) KIT Inject 1 each into the skin daily. Apply 1 vgo pump to body once daily for insulin infusion.  . Insulin Pen Needle 32G X 4 MM MISC Use Three per day to inject insulin  . insulin regular (  NOVOLIN R,HUMULIN R) 100 units/mL injection Inject into the skin 3 (three) times daily before meals. USE INSULIN TO FILL UP v-GO PUMP  . Lancets (FREESTYLE) lancets Use to test blood sugar once daily Dx code E11.8  . losartan (COZAAR) 25 MG tablet TAKE ONE HALF TABLET BY MOUTH DAILY  . meloxicam (MOBIC) 7.5 MG tablet TAKE ONE TABLET BY MOUTH DAILY  . metFORMIN (GLUCOPHAGE-XR) 500 MG 24 hr tablet Take 1 tablet (500 mg total) by mouth 2 (two) times daily.  . pioglitazone (ACTOS) 15 MG tablet Take 1 tablet (15 mg total) by mouth daily.  . [DISCONTINUED] metoprolol tartrate (LOPRESSOR) 25 MG tablet TAKE  ONE-HALF TABLET BY MOUTH TWICE A DAY  . [DISCONTINUED] rosuvastatin (CRESTOR) 20 MG tablet TAKE ONE-HALF TO ONE TABLET BY MOUTH BY MOUTH DAILY AS TOLERATED     Allergies:   Atorvastatin and Codeine   Social History   Socioeconomic History  . Marital status: Widowed    Spouse name: Not on file  . Number of children: 3  . Years of education: Not on file  . Highest education level: Not on file  Occupational History  . Occupation: retired  Scientific laboratory technician  . Financial resource strain: Not hard at all  . Food insecurity    Worry: Never true    Inability: Never true  . Transportation needs    Medical: No    Non-medical: No  Tobacco Use  . Smoking status: Light Tobacco Smoker    Types: Cigarettes  . Smokeless tobacco: Never Used  . Tobacco comment: smokes on social occassions  Substance and Sexual Activity  . Alcohol use: Yes    Alcohol/week: 10.0 standard drinks    Types: 10 Cans of beer per week    Comment: socially  . Drug use: No  . Sexual activity: Never  Lifestyle  . Physical activity    Days per week: 4 days    Minutes per session: 50 min  . Stress: To some extent  Relationships  . Social connections    Talks on phone: More than three times a week    Gets together: More than three times a week    Attends religious service: More than 4 times per year    Active member of club or organization: Not on file    Attends meetings of clubs or organizations: More than 4 times per year    Relationship status: Married  Other Topics Concern  . Not on file  Social History Narrative  . Not on file     Family History: The patient's family history includes Cancer in his brother; Diabetes in his brother and mother. There is no history of Heart disease.  ROS:   Please see the history of present illness.     All other systems reviewed and are negative.  EKGs/Labs/Other Studies Reviewed:    The following studies were reviewed today:  Cardiac catheterization 12/12/2015 at  [facility]: - RCA 80 to 90% lesion, single DES placed.  Also had first diagonal ostial lesion of 40%.  LVEDP 16.  EF 45-50 with inferior wall hypokinesis.  EKG:  EKG is ordered today.  10/05/2019-sinus bradycardia 55 no other abnormalities 09/10/2018-sinus bradycardia rate 54 with no other abnormalities personally reviewed and interpreted prior sinus bradycardia 48 with no other abnormalities.  Previous accelerated junctional rhythm with T wave inversions diffusely.  Recent Labs: 04/13/2019: Hemoglobin 14.9; Platelets 181.0; TSH 2.51 08/05/2019: ALT 27; BUN 21; Creatinine, Ser 0.89; Potassium 4.1; Sodium 137  Recent Lipid Panel    Component Value Date/Time   CHOL 120 01/18/2019 0846   TRIG 68 01/18/2019 0846   HDL 67 01/18/2019 0846   CHOLHDL 1.8 01/18/2019 0846   CHOLHDL 2 02/20/2018 0924   VLDL 16.2 02/20/2018 0924   LDLCALC 39 01/18/2019 0846    Physical Exam:    VS:  BP (!) 142/70   Pulse (!) 55   Ht 6' (1.829 m)   Wt 219 lb (99.3 kg)   SpO2 95%   BMI 29.70 kg/m     Wt Readings from Last 3 Encounters:  10/05/19 219 lb (99.3 kg)  08/09/19 213 lb 3.2 oz (96.7 kg)  04/13/19 210 lb (95.3 kg)     GEN: Well nourished, well developed, in no acute distress  HEENT: normal  Neck: no JVD, carotid bruits, or masses Cardiac: RRR; no murmurs, rubs, or gallops,no edema  Respiratory:  clear to auscultation bilaterally, normal work of breathing GI: soft, nontender, nondistended, + BS MS: no deformity or atrophy  Skin: warm and dry, no rash, solar purpura Neuro:  Alert and Oriented x 3, Strength and sensation are intact Psych: euthymic mood, full affect   ASSESSMENT:    1. Coronary artery disease due to lipid rich plaque   2. Bradycardia   3. Need for immunization against influenza    PLAN:    In order of problems listed above:  CAD post MI RCA stent - 12/12/2015 RCA DES with 45 to 50% basal inferior hypokinesis EF mild mitral regurgitation mild tricuspid  regurgitation.  Initially with marked T wave inversion on ECG.  The subsequently resolved. - Try to avoid Brilinta in the future given dyspnea. - Doing very well.  Participated in cardiac rehabilitation, [employer] when he is able again, aspirin lifelong, aggressive secondary prevention.  Feels well.   Hyperlipidemia/muscle fatigue -Has very low LDL.  He has been doing well with Crestor 10 mg once a day.  Diabetes with hyperlipidemia - Dr. Dwyane Dee and Dr. Ronnald Ramp have been following.  Blindness in one eye is complication.  Bradycardia -I am going ahead and stopping his metoprolol today.  Heart rate 55 bpm.  He is on very low-dose.  Solar purpura - Bruising on forearms noted.  Aspirin only.  Has seen dermatology  Trigger finger -I discussed previously with Dr. Ronnald Ramp  He will call us back about results from statin holiday.  We will also contemplate discontinuing his beta-blocker at that time.  Medication Adjustments/Labs and Tests Ordered: Current medicines are reviewed at length with the patient today.  Concerns regarding medicines are outlined above.  Orders Placed This Encounter  Procedures  . Flu Vaccine QUAD High Dose(Fluad)  . EKG 12-Lead   Meds ordered this encounter  Medications  . rosuvastatin (CRESTOR) 10 MG tablet    Sig: Take 1 tablet (10 mg total) by mouth at bedtime.    Dispense:  30 tablet    Refill:  11    Patient Instructions  Medication Instructions:  Please discontinue your Metoprolol. Decrease Crestor to 10 mg daily and continue all other medications as listed.  *If you need a refill on your cardiac medications before your next appointment, please call your pharmacy*  Follow-Up: At [facility], you and your health needs are our priority.  As part of our continuing mission to provide you with exceptional heart care, we have created designated Provider Care Teams.  These Care Teams include your primary Cardiologist (physician) and Advanced Practice Providers  (APPs -  Physician  Assistants and Nurse Practitioners) who all work together to provide you with the care you need, when you need it.  Your next appointment:   12 months  The format for your next appointment:   In Person  Provider:   You may see Alan Furbish, MD or one of the following Advanced Practice Providers on your designated Care Team:    Truitt Merle, NP  Cecilie Kicks, NP  Kathyrn Drown, NP   Thank you for choosing [facility]!!        Signed, Alan Furbish, MD  10/05/2019 3:37 PM    [place]

## 2019-10-11 ENCOUNTER — Ambulatory Visit: Payer: Medicare Other | Admitting: Cardiology

## 2019-10-27 ENCOUNTER — Other Ambulatory Visit: Payer: Self-pay | Admitting: Cardiology

## 2019-10-29 MED ORDER — ROSUVASTATIN CALCIUM 10 MG PO TABS: 10.0000 mg | ORAL_TABLET | Freq: Every day | ORAL | 3 refills | Status: DC

## 2019-12-20 ENCOUNTER — Other Ambulatory Visit: Payer: Self-pay

## 2019-12-20 ENCOUNTER — Other Ambulatory Visit (INDEPENDENT_AMBULATORY_CARE_PROVIDER_SITE_OTHER): Payer: Medicare Other

## 2019-12-20 DIAGNOSIS — Z794 Long term (current) use of insulin: Secondary | ICD-10-CM | POA: Diagnosis not present

## 2019-12-20 DIAGNOSIS — E7800 Pure hypercholesterolemia, unspecified: Secondary | ICD-10-CM

## 2019-12-20 DIAGNOSIS — E1165 Type 2 diabetes mellitus with hyperglycemia: Secondary | ICD-10-CM

## 2019-12-20 LAB — LIPID PANEL
Cholesterol: 110 mg/dL (ref 0–200)
HDL: 62 mg/dL (ref >39.00)
LDL Cholesterol: 38 mg/dL (ref 0–99)
NonHDL: 48.43
Total CHOL/HDL Ratio: 2
Triglycerides: 52 mg/dL (ref 0.0–149.0)
VLDL: 10.4 mg/dL (ref 0.0–40.0)

## 2019-12-20 LAB — COMPREHENSIVE METABOLIC PANEL
ALT: 38 U/L (ref 0–53)
AST: 33 U/L (ref 0–37)
Albumin: 3.8 g/dL (ref 3.5–5.2)
Alkaline Phosphatase: 56 U/L (ref 39–117)
BUN: 15 mg/dL (ref 6–23)
CO2: 23 mEq/L (ref 19–32)
Calcium: 8.8 mg/dL (ref 8.4–10.5)
Chloride: 108 mEq/L (ref 96–112)
Creatinine, Ser: 0.93 mg/dL (ref 0.40–1.50)
GFR: 79.24 mL/min (ref >60.00)
Glucose, Bld: 164 mg/dL — ABNORMAL HIGH (ref 70–99)
Potassium: 4 mEq/L (ref 3.5–5.1)
Sodium: 138 mEq/L (ref 135–145)
Total Bilirubin: 0.8 mg/dL (ref 0.2–1.2)
Total Protein: 6.4 g/dL (ref 6.0–8.3)

## 2019-12-20 LAB — MICROALBUMIN / CREATININE URINE RATIO
Creatinine,U: 183.5 mg/dL
Microalb Creat Ratio: 1 mg/g (ref 0.0–30.0)
Microalb, Ur: 1.9 mg/dL (ref 0.0–1.9)

## 2019-12-20 LAB — HEMOGLOBIN A1C: Hgb A1c MFr Bld: 7.1 % — ABNORMAL HIGH (ref 4.6–6.5)

## 2019-12-21 ENCOUNTER — Other Ambulatory Visit: Payer: Self-pay

## 2019-12-23 ENCOUNTER — Encounter: Payer: Self-pay | Admitting: Endocrinology

## 2019-12-23 ENCOUNTER — Ambulatory Visit (INDEPENDENT_AMBULATORY_CARE_PROVIDER_SITE_OTHER): Payer: Medicare Other | Admitting: Endocrinology

## 2019-12-23 VITALS — BP 140/68 | HR 88 | Ht 72.0 in | Wt 210.4 lb

## 2019-12-23 DIAGNOSIS — Z794 Long term (current) use of insulin: Secondary | ICD-10-CM | POA: Diagnosis not present

## 2019-12-23 DIAGNOSIS — E7800 Pure hypercholesterolemia, unspecified: Secondary | ICD-10-CM | POA: Diagnosis not present

## 2019-12-23 DIAGNOSIS — E1142 Type 2 diabetes mellitus with diabetic polyneuropathy: Secondary | ICD-10-CM

## 2019-12-23 DIAGNOSIS — E1165 Type 2 diabetes mellitus with hyperglycemia: Secondary | ICD-10-CM

## 2019-12-23 DIAGNOSIS — E042 Nontoxic multinodular goiter: Secondary | ICD-10-CM

## 2019-12-23 NOTE — Progress Notes (Signed)
Patient ID: Alan Lopez, male   DOB: Dec 19, 1945, 74 y.o.   MRN: [id number]            Reason for Appointment: Follow-up for Type 2 Diabetes   History of Present Illness:          Date of diagnosis of type 2 diabetes mellitus :  1999      Past history:  He had modestly increase blood sugars at diagnosis and was started on metformin Subsequently glipizide was added to improve his control. He thinks that his blood sugars have been mostly poorly controlled over the last several years He had been on glipizide and metformin for years without any adjustment of his dosage of glipizide  Review of his A1c shows that it had ranged from 9-9.5 since 07/2013 Previously his PCP tried him on a sample of Jardiance 10 mg without significant improvement  He was started on the V-go pump on 07/02/17 because of inconsistent control and poor compliance with mealtime insulin  Recent history:   Oral hypoglycemic drugs the patient is taking are: Actos 15 mg, metformin ER 500 mg, 1 tablets in the morning    Current INSULIN regimen: V-go pump SETTINGS: Basal rate = 30 units BOLUSES = 3-4 at breakfast, 8 at lunch and  8 at dinner  His A1c is 7.1 compared to 6.5 and has been as high as 8.9  His last visit was in 07/2019  Current blood sugar patterns and problems identified:  He did bring his monitor to the office but he says he had to get a new meter and only records for the last 5 days are available  As before he does not check enough readings at bedtime  Although previously was bolusing 5 clicks for dinnertime he is mostly bolusing for clicks because of fear of hypoglycemia overnight  He has only 2 readings after dinner recently and both of them were over 200  He thinks blood sugars are very high when eating pizza and also eating out higher fat meals  Not clear what his blood sugars are otherwise after dinner  Has a couple of readings after breakfast lunch which are okay recently  Again  fasting readings are somewhat variable likely related to diet  Is overall trying to cut back on portions and has lost weight  Overall trying to be active with working outside and some walking  He is changing his pump at the same time in the morning daily  Side effects from medications have been: Mild  diarrhea from regular metformin  Compliance with the medical regimen: fair   Hypoglycemia: never   Glucose monitoring:  done once a day usually       Glucometer:   Contour    PRE-MEAL Fasting Lunch Dinner Bedtime Overall  Glucose range:  110-150    246, 255   Mean/median:      172   POST-MEAL PC Breakfast PC Lunch PC Dinner  Glucose range:  165  149   Mean/median:      Previous readings:  PRE-MEAL  mornings Lunch Dinner Bedtime Overall  Glucose range:  104-178    164-206   Mean/median:  136    177      Self-care: The diet that the patient has been following is: tries to limit high carbohydrate foods. eating out about 4 times a week     Typical meal intake: Breakfast is  cereal/meat .  Usually 2 servings of vegetables in the evening and avoiding potatoes and  bread usually Usually avoiding drinks with sugar                Dietician visit, most recent: 05/2015  CDE visit: 01/2016  Weight history: Maximum weight 215 previously  Wt Readings from Last 3 Encounters:  12/23/19 210 lb 6.4 oz (95.4 kg)  10/05/19 219 lb (99.3 kg)  08/09/19 213 lb 3.2 oz (96.7 kg)    Glycemic control:    Lab Results  Component Value Date   HGBA1C 7.1 (H) 12/20/2019   HGBA1C 6.5 08/05/2019   HGBA1C 6.8 (H) 04/13/2019   Lab Results  Component Value Date   MICROALBUR 1.9 12/20/2019   LDLCALC 38 12/20/2019   CREATININE 0.93 12/20/2019    Lab on 12/20/2019  Component Date Value Ref Range Status  . Cholesterol 12/20/2019 110  0 - 200 mg/dL Final   ATP III Classification       Desirable:  < 200 mg/dL               Borderline High:  200 - 239 mg/dL          High:  > = 240 mg/dL  .  Triglycerides 12/20/2019 52.0  0.0 - 149.0 mg/dL Final   Normal:  <150 mg/dLBorderline High:  150 - 199 mg/dL  . HDL 12/20/2019 62.00  >39.00 mg/dL Final  . VLDL 12/20/2019 10.4  0.0 - 40.0 mg/dL Final  . LDL Cholesterol 12/20/2019 38  0 - 99 mg/dL Final  . Total CHOL/HDL Ratio 12/20/2019 2   Final                  Men          Women1/2 Average Risk     3.4          3.3Average Risk          5.0          4.42X Average Risk          9.6          7.13X Average Risk          15.0          11.0                      . NonHDL 12/20/2019 48.43   Final   NOTE:  Non-HDL goal should be 30 mg/dL higher than patient's LDL goal (i.e. LDL goal of < 70 mg/dL, would have non-HDL goal of < 100 mg/dL)  . Microalb, Ur 12/20/2019 1.9  0.0 - 1.9 mg/dL Final  . Creatinine,U 12/20/2019 183.5  mg/dL Final  . Microalb Creat Ratio 12/20/2019 1.0  0.0 - 30.0 mg/g Final  . Sodium 12/20/2019 138  135 - 145 mEq/L Final  . Potassium 12/20/2019 4.0  3.5 - 5.1 mEq/L Final  . Chloride 12/20/2019 108  96 - 112 mEq/L Final  . CO2 12/20/2019 23  19 - 32 mEq/L Final  . Glucose, Bld 12/20/2019 164* 70 - 99 mg/dL Final  . BUN 12/20/2019 15  6 - 23 mg/dL Final  . Creatinine, Ser 12/20/2019 0.93  0.40 - 1.50 mg/dL Final  . Total Bilirubin 12/20/2019 0.8  0.2 - 1.2 mg/dL Final  . Alkaline Phosphatase 12/20/2019 56  39 - 117 U/L Final  . AST 12/20/2019 33  0 - 37 U/L Final  . ALT 12/20/2019 38  0 - 53 U/L Final  . Total Protein 12/20/2019 6.4  6.0 - 8.3 g/dL Final  .  Albumin 12/20/2019 3.8  3.5 - 5.2 g/dL Final  . GFR 12/20/2019 79.24  >60.00 mL/min Final  . Calcium 12/20/2019 8.8  8.4 - 10.5 mg/dL Final  . Hgb A1c MFr Bld 12/20/2019 7.1* 4.6 - 6.5 % Final   Glycemic Control Guidelines for People with Diabetes:Non Diabetic:  <6%Goal of Therapy: <7%Additional Action Suggested:  >8%       Allergies as of 12/23/2019      Reactions   Atorvastatin Other (See Comments)   Codeine Other (See Comments)   Patient doesn't like how it  makes him feel, "spaced out, not together"      Medication List       Accurate as of December 23, 2019 12:39 PM. If you have any questions, ask your nurse or doctor.        alfuzosin 10 MG 24 hr tablet Commonly known as: UROXATRAL Take 10 mg by mouth daily with breakfast.   aspirin EC 81 MG tablet Take 81 mg by mouth daily.   cholecalciferol 1000 units tablet Commonly known as: VITAMIN D Take 1,000 Units by mouth daily.   Contour Next Test test strip Generic drug: glucose blood USE TO TEST BLOOD SUGAR 2-3 TIMES DAILY   finasteride 5 MG tablet Commonly known as: PROSCAR Take 5 mg by mouth daily.   fluticasone 50 MCG/ACT nasal spray Commonly known as: FLONASE Place 2 sprays into both nostrils daily as needed for allergies (for seasonal allergies). Reported on 01/29/2016   freestyle lancets Use to test blood sugar once daily Dx code E11.8   Insulin Pen Needle 32G X 4 MM Misc Use Three per day to inject insulin   insulin regular 100 units/mL injection Commonly known as: NOVOLIN R Inject into the skin 3 (three) times daily before meals. USE INSULIN TO FILL UP v-GO PUMP   losartan 25 MG tablet Commonly known as: COZAAR TAKE ONE HALF TABLET BY MOUTH DAILY   meloxicam 7.5 MG tablet Commonly known as: MOBIC TAKE ONE TABLET BY MOUTH DAILY   metFORMIN 500 MG 24 hr tablet Commonly known as: GLUCOPHAGE-XR Take 1 tablet (500 mg total) by mouth 2 (two) times daily. What changed: when to take this   pioglitazone 15 MG tablet Commonly known as: ACTOS Take 1 tablet (15 mg total) by mouth daily.   rosuvastatin 10 MG tablet Commonly known as: Crestor Take 1 tablet (10 mg total) by mouth at bedtime.   V-Go 30 Kit Inject 1 each into the skin daily. Apply 1 vgo pump to body once daily for insulin infusion.       Allergies:  Allergies  Allergen Reactions  . Atorvastatin Other (See Comments)  . Codeine Other (See Comments)    Patient doesn't like how it makes him feel,  "spaced out, not together"    Past Medical History:  Diagnosis Date  . 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 HTN (hypertension)   . Hypertrophy (benign) of prostate   . Multinodular goiter 04/12/2015  . Non-insulin dependent type 2 diabetes mellitus ([place])   . Status post non-ST elevation myocardial infarction (NSTEMI)    CATH  . Tobacco abuse   . Type II diabetes mellitus, uncontrolled ([place]) 04/12/2015    Past Surgical History:  Procedure Laterality Date  . CORONARY ANGIOPLASTY WITH STENT PLACEMENT      Family History  Problem Relation Age of Onset  . Diabetes Mother   . Diabetes Brother   . Cancer Brother   . Heart disease Neg Hx  Social History:  reports that he has been smoking cigarettes. He has never used smokeless tobacco. He reports current alcohol use of about 10.0 standard drinks of alcohol per week. He reports that he does not use drugs.    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   His last eye exam was in 12/2018  THYROID: Previously was found to have a 2 cm nodule on his left thyroid on his exam but ultrasound in 04/2015 showed multinodular goiter with mostly small nodules and the largest nodule on the left side of 1.4 cm has the appearance of a pseudo- nodule  Thyroid levels have been normal  Lab Results  Component Value Date   TSH 2.51 04/13/2019    NEUROPATHY: He is again concerned about feeling of numbness on the soles of his feet when he is walking or with touch He has no numbness in the toes No burning or tingling His last foot exam by PCP did not show any sensory loss      LABS:  Lab on 12/20/2019  Component Date Value Ref Range Status  . Cholesterol 12/20/2019 110  0 - 200 mg/dL Final   ATP III Classification       Desirable:  < 200 mg/dL               Borderline High:  200 - 239 mg/dL          High:  > = 240 mg/dL  . Triglycerides 12/20/2019 52.0  0.0 - 149.0 mg/dL Final   Normal:  <150 mg/dLBorderline High:  150 - 199 mg/dL  . HDL 12/20/2019 62.00  >39.00 mg/dL Final  . VLDL 12/20/2019 10.4  0.0 - 40.0 mg/dL Final  . LDL Cholesterol 12/20/2019 38  0 - 99 mg/dL Final  . Total CHOL/HDL Ratio 12/20/2019 2   Final                  Men          Women1/2 Average Risk     3.4          3.3Average Risk          5.0          4.42X Average Risk          9.6          7.13X Average Risk          15.0          11.0                      . NonHDL 12/20/2019 48.43   Final   NOTE:  Non-HDL goal should be 30 mg/dL higher than patient's LDL goal (i.e. LDL goal of < 70 mg/dL, would have non-HDL goal of < 100 mg/dL)  . Microalb, Ur 12/20/2019 1.9  0.0 - 1.9 mg/dL Final  . Creatinine,U 12/20/2019 183.5  mg/dL Final  . Microalb Creat Ratio 12/20/2019  1.0  0.0 - 30.0 mg/g Final  . Sodium 12/20/2019 138  135 - 145 mEq/L Final  . Potassium 12/20/2019 4.0  3.5 - 5.1 mEq/L Final  . Chloride 12/20/2019 108  96 - 112 mEq/L Final  . CO2 12/20/2019 23  19 - 32 mEq/L Final  . Glucose, Bld 12/20/2019 164* 70 - 99 mg/dL Final  . BUN 12/20/2019 15  6 - 23 mg/dL Final  . Creatinine, Ser 12/20/2019 0.93  0.40 - 1.50 mg/dL Final  . Total Bilirubin 12/20/2019 0.8  0.2 - 1.2 mg/dL Final  . Alkaline Phosphatase 12/20/2019 56  39 - 117 U/L Final  . AST 12/20/2019 33  0 - 37 U/L Final  . ALT 12/20/2019 38  0 - 53 U/L Final  . Total Protein 12/20/2019 6.4  6.0 - 8.3 g/dL Final  . Albumin 12/20/2019 3.8  3.5 - 5.2 g/dL Final  . GFR 12/20/2019 79.24  >60.00 mL/min Final  . Calcium 12/20/2019 8.8  8.4 - 10.5 mg/dL Final  . Hgb A1c MFr Bld 12/20/2019 7.1* 4.6 - 6.5 % Final   Glycemic  Control Guidelines for People with Diabetes:Non Diabetic:  <6%Goal of Therapy: <7%Additional Action Suggested:  >8%     Physical Examination:  BP 140/68 (BP Location: Right Arm, Patient Position: Sitting, Cuff Size: Normal)   Pulse 88   Ht 6' (1.829 m)   Wt 210 lb 6.4 oz (95.4 kg)   SpO2 95%   BMI 28.54 kg/m    No ankle edema present  ASSESSMENT:  Diabetes type 2, on insulin  See history of present illness for detailed discussion of his current management, blood sugar patterns and problems identified  A1c is higher at 7.1 compared to 6.5   He said that his sugars are higher from not watching his diet consistently over the holidays and also recently when he is eating out However he is probably not taking enough boluses for his meals Likely not monitoring enough after dinner to help adjust his insulin and most of his readings are late at night and not 2 hours after eating He has lost weight from being more active  His dose of the Metformin appears to be limited and he thinks that he cannot tolerate more than 1 tablet a day because of diarrhea although he previously had been taking up to 1500 mg long-term Again he has concerns about side effects which he keeps reading about on the Internet  Symptomatic neuropathy: He has only subjective numbness on the bottom of his feet which is not progressing Again discussed that the only management needed is better diabetes control  No microalbuminuria  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 and discussed again the need for consistent diabetes control and avoiding blood sugar spikes  LIPIDS: Excellent control with 10 mg Crestor now  He has questions about the Covid vaccine and where to get it and this was discussed, information printed out  PLAN:    He will try taking Metformin in the evening instead of morning to see if it is better tolerated, may potentially use twice a day  Needs to check blood sugars 2 hours after  eating consistently especially after dinner  For any large or high carbohydrate/high fat meals in the evening he will take 12 units coverage  Discussed targets for postprandial readings  He does not necessarily have to check his readings in the mornings as much  Continue to try and bolus 30-minute before eating  He will be  consistent with avoiding or reducing high-fat foods like pizza  Because of his cardiovascular history he is a good candidate for an SGLT2 drug but he generally likes to avoid brand-name medications for cost reasons, will discuss on the next visit   Counseling time on subjects discussed in assessment and plan sections is over 50% of today's 25 minute visit  Patient Instructions  More sugars at nite and keep 2 hour sugar under 237 Take upto 6 clicks  We are committed to keeping you informed about the COVID-19 vaccine.  As the vaccine continues to become available for each phase, we will ensure that patients who meet the criteria receive the information they need to access vaccination opportunities. Continue to check your MyChart account and [email or web address] for updates. Please review the Phase 1b information below.  Following [place]'s guidelines for the distribution of COVID-19 vaccines, we are pleased to share our plans to begin offering vaccines to those 75 and older (Phase 1b). Here are details of those plans:  [place] On Tuesday, Jan. 19, the [facility]) and [place] begin large-scale COVID-19 vaccinations at the [place]. The vaccinations are appointment only and for those 4 and older.  It is expected that 750 will initially be vaccinated per day [facility]. Capacity is expected to grow in the weeks ahead. However, the number of reservations accepted depends on the amount of vaccine available.  Online or Phone  Registration Only Walk-ins will NOT be accepted. Registration will open on Friday, January 15.   Health Department Registration [name] residents only)  [place] (Any local residents)  Other Counties We are also working in partnership with county health agencies in [place], [place] and [place] counties to ensure continuing vaccination availability in alignment with state guidelines in the weeks and months ahead. Information on phase 1b COVID-19 vaccination clinics being offered by local county health agencies is provided in the website links below for your convenience:  [place]'s phase 1b vaccination guidelines, prioritizing those 75 and over as the next eligible group to receive the COVID-19 vaccine, are detailed at [email or web address].   Vaccine Safety and Effectiveness Clinical trials for the Pfizer COVID-19 vaccine involved 42,000 people and showed that the vaccine is more than 95% effective in preventing COVID-19 with no serious safety concerns. Similar results have been reported for the Moderna COVID-19 vaccine. Side effects reported in the [place] clinical trials include a sore arm at the injection site, fatigue, headache, chills and fever. While side effects from the [place] COVID-19 vaccine are higher than for a typical flu vaccine, they are lower in many ways than side effects from the leading vaccine to prevent shingles. Side effects are signs that a vaccine is working and are related to your immune system being stimulated to produce antibodies against infection. Side effects from vaccination are far less significant than health impacts from COVID-19.  Staying Informed Pharmacists, infectious disease doctors, critical care nurses and other experts at [facility] continue to speak publicly through media interviews and direct communication with our patients and communities about the safety, effectiveness  and importance of vaccines to eliminate COVID-19. In addition, reliable information on vaccine safety, effectiveness, side effects and more is available on the following websites:  [place] Department of Health and Human Services COVID-19 Vaccine Information Website.  U.S. Centers for Disease Control and Prevention COVID-19 Vaccine  Lexicographer.  Staying Safe We agree with the CDC on what we can do to help our communities get back to normal: Getting "back to normal" is going to take all of our tools. If we use all the tools we have, we stand the best chance of getting our families, communities, schools and workplaces "back to normal" sooner:  Get vaccinated as soon as vaccines become available within the phase of the state's vaccination rollout plan for which you meet the eligibility criteria.  Wear a mask.  Stay 6 feet from others and avoid crowds.  Wash hands often.  For our most current information, please visit [email or web address].        Elayne Snare 12/23/2019, 12:39 PM   Note: This office note was prepared with Dragon voice recognition system technology. Any transcriptional errors that result from this process are unintentional.

## 2019-12-23 NOTE — Patient Instructions (Addendum)
More sugars at nite and keep 2 hour sugar under [id number] Take upto 6 clicks  We are committed to keeping you informed about the COVID-19 vaccine.  As the vaccine continues to become available for each phase, we will ensure that patients who meet the criteria receive the information they need to access vaccination opportunities. Continue to check your MyChart account and [email or web address] for updates. Please review the Phase 1b information below.  Following Alan Lopez's guidelines for the distribution of COVID-19 vaccines, we are pleased to share our plans to begin offering vaccines to those 75 and older (Phase 1b). Here are details of those plans:  [place] On Tuesday, Jan. 19, the Alan Lopez Alan Lopez) and Alan Lopez begin large-scale COVID-19 vaccinations at the [place]. The vaccinations are appointment only and for those 6 and older.  It is expected that 750 will initially be vaccinated per day [facility]. Capacity is expected to grow in the weeks ahead. However, the number of reservations accepted depends on the amount of vaccine available.  Online or Phone Registration Only Walk-ins will NOT be accepted. Registration will open on Friday, January 15.   Health Department Registration [facility] residents only)  [place] (Any local residents)  Other Counties We are also working in partnership with county health agencies in [place], [place] and [place] counties to ensure continuing vaccination availability in alignment with state guidelines in the weeks and months ahead. Information on phase 1b COVID-19 vaccination clinics being offered by local county health agencies is provided in the website links below for your convenience:  [facility] [place] [facility]'s phase 1b vaccination guidelines, prioritizing  those 75 and over as the next eligible group to receive the COVID-19 vaccine, are detailed at [email or web address].   Vaccine Safety and Effectiveness Clinical trials for the Pfizer COVID-19 vaccine involved 42,000 people and showed that the vaccine is more than 95% effective in preventing COVID-19 with no serious safety concerns. Similar results have been reported for the Moderna COVID-19 vaccine. Side effects reported in the [place] clinical trials include a sore arm at the injection site, fatigue, headache, chills and fever. While side effects from the Alan Lopez COVID-19 vaccine are higher than for a typical flu vaccine, they are lower in many ways than side effects from the leading vaccine to prevent shingles. Side effects are signs that a vaccine is working and are related to your immune system being stimulated to produce antibodies against infection. Side effects from vaccination are far less significant than health impacts from COVID-19.  Staying Informed Pharmacists, infectious disease doctors, critical care nurses and other experts at [facility] continue to speak publicly through media interviews and direct communication with our patients and communities about the safety, effectiveness and importance of vaccines to eliminate COVID-19. In addition, reliable information on vaccine safety, effectiveness, side effects and more is available on the following websites:  [place] Department of Health and Human Services COVID-19 Vaccine Information Website.  U.S. Centers for Disease Control and Prevention XX123456 Human resources officer.  Staying Safe We agree with the CDC on what we can do to help our communities get back to normal: Getting "back to normal" is going to take all of our tools. If we use all the tools we have, we stand the best chance of getting our families, communities, schools and workplaces "back to normal" sooner:  Get vaccinated as soon  as vaccines become available within  the phase of the state's vaccination rollout plan for which you meet the eligibility criteria.  Wear a mask.  Stay 6 feet from others and avoid crowds.  Wash hands often.  For our most current information, please visit [email or web address].

## 2019-12-30 DIAGNOSIS — Z23 Encounter for immunization: Secondary | ICD-10-CM | POA: Diagnosis not present

## 2020-01-19 ENCOUNTER — Other Ambulatory Visit: Payer: Self-pay | Admitting: Endocrinology

## 2020-01-25 ENCOUNTER — Other Ambulatory Visit: Payer: Self-pay | Admitting: Endocrinology

## 2020-02-04 DIAGNOSIS — Z23 Encounter for immunization: Secondary | ICD-10-CM | POA: Diagnosis not present

## 2020-02-22 DIAGNOSIS — H2512 Age-related nuclear cataract, left eye: Secondary | ICD-10-CM | POA: Diagnosis not present

## 2020-02-22 DIAGNOSIS — H5201 Hypermetropia, right eye: Secondary | ICD-10-CM | POA: Diagnosis not present

## 2020-02-22 DIAGNOSIS — E119 Type 2 diabetes mellitus without complications: Secondary | ICD-10-CM | POA: Diagnosis not present

## 2020-02-22 DIAGNOSIS — H524 Presbyopia: Secondary | ICD-10-CM | POA: Diagnosis not present

## 2020-02-22 LAB — HM DIABETES EYE EXAM

## 2020-03-20 ENCOUNTER — Other Ambulatory Visit: Payer: Self-pay | Admitting: Endocrinology

## 2020-03-21 ENCOUNTER — Other Ambulatory Visit: Payer: Self-pay

## 2020-03-21 ENCOUNTER — Other Ambulatory Visit (INDEPENDENT_AMBULATORY_CARE_PROVIDER_SITE_OTHER): Payer: Medicare Other

## 2020-03-21 DIAGNOSIS — Z794 Long term (current) use of insulin: Secondary | ICD-10-CM | POA: Diagnosis not present

## 2020-03-21 DIAGNOSIS — E1165 Type 2 diabetes mellitus with hyperglycemia: Secondary | ICD-10-CM

## 2020-03-21 DIAGNOSIS — E042 Nontoxic multinodular goiter: Secondary | ICD-10-CM

## 2020-03-21 LAB — COMPREHENSIVE METABOLIC PANEL
ALT: 32 U/L (ref 0–53)
AST: 25 U/L (ref 0–37)
Albumin: 4 g/dL (ref 3.5–5.2)
Alkaline Phosphatase: 61 U/L (ref 39–117)
BUN: 14 mg/dL (ref 6–23)
CO2: 22 mEq/L (ref 19–32)
Calcium: 9.2 mg/dL (ref 8.4–10.5)
Chloride: 106 mEq/L (ref 96–112)
Creatinine, Ser: 0.94 mg/dL (ref 0.40–1.50)
GFR: 78.21 mL/min (ref >60.00)
Glucose, Bld: 197 mg/dL — ABNORMAL HIGH (ref 70–99)
Potassium: 4.1 mEq/L (ref 3.5–5.1)
Sodium: 136 mEq/L (ref 135–145)
Total Bilirubin: 0.6 mg/dL (ref 0.2–1.2)
Total Protein: 6.5 g/dL (ref 6.0–8.3)

## 2020-03-21 LAB — HEMOGLOBIN A1C: Hgb A1c MFr Bld: 7.4 % — ABNORMAL HIGH (ref 4.6–6.5)

## 2020-03-21 LAB — TSH: TSH: 2.14 u[IU]/mL (ref 0.35–4.50)

## 2020-03-23 ENCOUNTER — Encounter: Payer: Self-pay | Admitting: Endocrinology

## 2020-03-23 ENCOUNTER — Other Ambulatory Visit: Payer: Self-pay

## 2020-03-23 ENCOUNTER — Ambulatory Visit (INDEPENDENT_AMBULATORY_CARE_PROVIDER_SITE_OTHER): Payer: Medicare Other | Admitting: Endocrinology

## 2020-03-23 VITALS — BP 122/70 | HR 86 | Ht 72.0 in | Wt 208.6 lb

## 2020-03-23 DIAGNOSIS — E1142 Type 2 diabetes mellitus with diabetic polyneuropathy: Secondary | ICD-10-CM | POA: Diagnosis not present

## 2020-03-23 DIAGNOSIS — I10 Essential (primary) hypertension: Secondary | ICD-10-CM | POA: Diagnosis not present

## 2020-03-23 DIAGNOSIS — Z794 Long term (current) use of insulin: Secondary | ICD-10-CM

## 2020-03-23 DIAGNOSIS — E1165 Type 2 diabetes mellitus with hyperglycemia: Secondary | ICD-10-CM | POA: Diagnosis not present

## 2020-03-23 MED ORDER — FARXIGA 5 MG PO TABS: 5.0000 mg | ORAL_TABLET | Freq: Every day | ORAL | 3 refills | Status: DC

## 2020-03-23 NOTE — Patient Instructions (Addendum)
Check blood sugars on waking up 2-3 days a week  Also check blood sugars about 2 hours after meals and do this after different meals by rotation  Recommended blood sugar levels on waking up are 90-130 and about 2 hours after meal is 130-180  Please bring your blood sugar monitor to each visit, thank you  Check BP  Weekly   Do not renew Metformin

## 2020-03-23 NOTE — Progress Notes (Signed)
Patient ID: Alan Lopez, male   DOB: 06/01/1946, 74 y.o.   MRN: [id number]            Reason for Appointment: Follow-up for Type 2 Diabetes   History of Present Illness:          Date of diagnosis of type 2 diabetes mellitus :  1999      Past history:  He had modestly increase blood sugars at diagnosis and was started on metformin Subsequently glipizide was added to improve his control. He thinks that his blood sugars have been mostly poorly controlled over the last several years He had been on glipizide and metformin for years without any adjustment of his dosage of glipizide  Review of his A1c shows that it had ranged from 9-9.5 since 07/2013 Previously his PCP tried him on a sample of Jardiance 10 mg without significant improvement  He was started on the V-go pump on 07/02/17 because of inconsistent control and poor compliance with mealtime insulin  Recent history:   Oral hypoglycemic drugs the patient is taking are: Actos 15 mg, metformin ER 500 mg, 1 tablet daily  Current INSULIN regimen: V-go pump SETTINGS: Basal rate = 30 units BOLUSES = 3-4 at breakfast, 8 at lunch and  8-10 at dinner  His A1c is gradually increasing and now 7.1, previously 6.5-8.9   Current blood sugar patterns and problems identified:  He did not check his sugars very much at all and only once after supper  Although he thinks that his fasting readings are higher when he is eating later in the evening his morning sugars are fluctuating  Usually taking an extra 1-2 clicks when eating more carbohydrates especially at dinnertime  Overall has been active except recently since the increase in pollen count causes allergies  Weight is down 2 pounds  Low blood sugar readings after breakfast or lunch except once with meals not much higher than the previous reading  He is changing his pump at the same time in the morning as before  Side effects from medications have been: Mild  diarrhea from regular  metformin  Compliance with the medical regimen: fair   Hypoglycemia: never   Glucose monitoring:  done once a day usually       Glucometer:   Contour    PRE-MEAL Fasting Lunch Dinner Bedtime Overall  Glucose range: 95-182   91    Mean/median: 152       POST-MEAL PC Breakfast PC Lunch PC Dinner  Glucose range:  218   207  Mean/median:      Previous readings:  PRE-MEAL Fasting Lunch Dinner Bedtime Overall  Glucose range:  110-150    246, 255   Mean/median:      172   POST-MEAL PC Breakfast PC Lunch PC Dinner  Glucose range:  165  149   Mean/median:         Self-care: The diet that the patient has been following is: tries to limit high carbohydrate foods. eating out about 4 times a week     Typical meal intake: Breakfast is  cereal/meat .  Usually 2 servings of vegetables in the evening and avoiding potatoes and bread usually Usually avoiding drinks with sugar                Dietician visit, most recent: 05/2015  CDE visit: 01/2016  Weight history: Maximum weight 215 previously  Wt Readings from Last 3 Encounters:  03/23/20 208 lb 9.6 oz (94.6 kg)  12/23/19  210 lb 6.4 oz (95.4 kg)  10/05/19 219 lb (99.3 kg)    Glycemic control:    Lab Results  Component Value Date   HGBA1C 7.4 (H) 03/21/2020   HGBA1C 7.1 (H) 12/20/2019   HGBA1C 6.5 08/05/2019   Lab Results  Component Value Date   MICROALBUR 1.9 12/20/2019   LDLCALC 38 12/20/2019   CREATININE 0.94 03/21/2020    Lab on 03/21/2020  Component Date Value Ref Range Status  . TSH 03/21/2020 2.14  0.35 - 4.50 uIU/mL Final  . Sodium 03/21/2020 136  135 - 145 mEq/L Final  . Potassium 03/21/2020 4.1  3.5 - 5.1 mEq/L Final  . Chloride 03/21/2020 106  96 - 112 mEq/L Final  . CO2 03/21/2020 22  19 - 32 mEq/L Final  . Glucose, Bld 03/21/2020 197* 70 - 99 mg/dL Final  . BUN 03/21/2020 14  6 - 23 mg/dL Final  . Creatinine, Ser 03/21/2020 0.94  0.40 - 1.50 mg/dL Final  . Total Bilirubin 03/21/2020 0.6  0.2 - 1.2 mg/dL  Final  . Alkaline Phosphatase 03/21/2020 61  39 - 117 U/L Final  . AST 03/21/2020 25  0 - 37 U/L Final  . ALT 03/21/2020 32  0 - 53 U/L Final  . Total Protein 03/21/2020 6.5  6.0 - 8.3 g/dL Final  . Albumin 03/21/2020 4.0  3.5 - 5.2 g/dL Final  . GFR 03/21/2020 78.21  >60.00 mL/min Final  . Calcium 03/21/2020 9.2  8.4 - 10.5 mg/dL Final  . Hgb A1c MFr Bld 03/21/2020 7.4* 4.6 - 6.5 % Final   Glycemic Control Guidelines for People with Diabetes:Non Diabetic:  <6%Goal of Therapy: <7%Additional Action Suggested:  >8%       Allergies as of 03/23/2020      Reactions   Atorvastatin Other (See Comments)   Codeine Other (See Comments)   Patient doesn't like how it makes him feel, "spaced out, not together"      Medication List       Accurate as of March 23, 2020  9:40 AM. If you have any questions, ask your nurse or doctor.        alfuzosin 10 MG 24 hr tablet Commonly known as: UROXATRAL Take 10 mg by mouth daily with breakfast.   aspirin EC 81 MG tablet Take 81 mg by mouth daily.   cholecalciferol 1000 units tablet Commonly known as: VITAMIN D Take 1,000 Units by mouth daily.   Contour Next Test test strip Generic drug: glucose blood USE TO TEST BLOOD SUGAR 2-3 TIMES DAILY   finasteride 5 MG tablet Commonly known as: PROSCAR Take 5 mg by mouth daily.   fluticasone 50 MCG/ACT nasal spray Commonly known as: FLONASE Place 2 sprays into both nostrils daily as needed for allergies (for seasonal allergies). Reported on 01/29/2016   freestyle lancets Use to test blood sugar once daily Dx code E11.8   Insulin Pen Needle 32G X 4 MM Misc Use Three per day to inject insulin   insulin regular 100 units/mL injection Commonly known as: NOVOLIN R Inject into the skin 3 (three) times daily before meals. USE INSULIN TO FILL UP v-GO PUMP   losartan 25 MG tablet Commonly known as: COZAAR TAKE ONE HALF TABLET BY MOUTH DAILY   meloxicam 7.5 MG tablet Commonly known as: MOBIC TAKE  ONE TABLET BY MOUTH DAILY   metFORMIN 500 MG 24 hr tablet Commonly known as: GLUCOPHAGE-XR Take 1 tablet by mouth once daily at dinner.   pioglitazone  15 MG tablet Commonly known as: ACTOS TAKE ONE TABLET BY MOUTH DAILY   rosuvastatin 10 MG tablet Commonly known as: Crestor Take 1 tablet (10 mg total) by mouth at bedtime.   V-Go 30 Kit APPLY AS INSTRUCTED       Allergies:  Allergies  Allergen Reactions  . Atorvastatin Other (See Comments)  . Codeine Other (See Comments)    Patient doesn't like how it makes him feel, "spaced out, not together"    Past Medical History:  Diagnosis Date  . 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 HTN (hypertension)   . Hypertrophy (benign) of prostate   . Multinodular goiter 04/12/2015  . Non-insulin dependent type 2 diabetes mellitus ([place])   . Status post non-ST elevation myocardial infarction (NSTEMI)    CATH  . Tobacco abuse   . Type II diabetes mellitus, uncontrolled ([place]) 04/12/2015    Past Surgical History:  Procedure Laterality Date  . CORONARY ANGIOPLASTY WITH STENT PLACEMENT      Family History  Problem Relation Age of Onset  . Diabetes Mother   . Diabetes Brother   . Cancer Brother   . Heart disease Neg Hx     Social History:  reports that he has been smoking cigarettes. He has never used smokeless tobacco. He reports current alcohol use of about 10.0 standard drinks of alcohol per week. He reports that he does not use drugs.    Review of Systems     Lipid history:   Has history of CAD and myocardial infarction  Continues on 10 mg Crestor  LDL is below 70 Labs as follows:   Lab Results  Component Value Date   CHOL 110 12/20/2019   HDL 62.00 12/20/2019   LDLCALC 38 12/20/2019   TRIG 52.0 12/20/2019   CHOLHDL 2 12/20/2019           Eyes: He has had blindness in his left eye from old injury   His last eye exam was in 12/2018  THYROID: Previously was found to have a 2 cm nodule on his left thyroid on his exam  but ultrasound in 04/2015 showed multinodular goiter with mostly small nodules and the largest nodule on the left side of 1.4 cm has the appearance of a pseudo- nodule  Thyroid levels have been normal  Lab Results  Component Value Date   TSH 2.14 03/21/2020    NEUROPATHY: Has feeling of numbness on the soles of his feet which is not any worse Sometimes will have some pains in the feet if standing for long He has no numbness in the toes No burning or tingling His last foot exam by PCP did not show any sensory loss     LABS:  Lab on 03/21/2020  Component Date Value Ref Range Status  . TSH 03/21/2020 2.14  0.35 - 4.50 uIU/mL Final  . Sodium 03/21/2020 136  135 - 145 mEq/L Final  . Potassium 03/21/2020 4.1  3.5 - 5.1 mEq/L Final  . Chloride 03/21/2020 106  96 - 112 mEq/L Final  . CO2 03/21/2020 22  19 - 32 mEq/L Final  . Glucose, Bld 03/21/2020 197* 70 - 99 mg/dL Final  . BUN 03/21/2020 14  6 - 23 mg/dL Final  . Creatinine, Ser 03/21/2020 0.94  0.40 - 1.50 mg/dL Final  . Total Bilirubin 03/21/2020 0.6  0.2 - 1.2 mg/dL Final  . Alkaline Phosphatase 03/21/2020 61  39 - 117 U/L Final  . AST 03/21/2020 25  0 - 37  U/L Final  . ALT 03/21/2020 32  0 - 53 U/L Final  . Total Protein 03/21/2020 6.5  6.0 - 8.3 g/dL Final  . Albumin 03/21/2020 4.0  3.5 - 5.2 g/dL Final  . GFR 03/21/2020 78.21  >60.00 mL/min Final  . Calcium 03/21/2020 9.2  8.4 - 10.5 mg/dL Final  . Hgb A1c MFr Bld 03/21/2020 7.4* 4.6 - 6.5 % Final   Glycemic Control Guidelines for People with Diabetes:Non Diabetic:  <6%Goal of Therapy: <7%Additional Action Suggested:  >8%     Physical Examination:  BP 122/70 (BP Location: Left Arm, Patient Position: Sitting, Cuff Size: Normal)   Pulse 86   Ht 6' (1.829 m)   Wt 208 lb 9.6 oz (94.6 kg)   SpO2 95%   BMI 28.29 kg/m     ASSESSMENT:  Diabetes type 2, on insulin  See history of present illness for detailed discussion of his current management, blood sugar patterns  and problems identified  A1c is higher at 7.4  Mild peripheral neuropathy: No increase in symptoms Would like to have more consistent diabetes control  LIPIDS: Excellent control with 10 mg Crestor as of 1/21  Mild hypertension usually well controlled  PLAN:   Start Farxiga 5 mg daily in the morning Discussed action of SGLT 2 drugs on lowering glucose by decreasing kidney absorption of glucose, benefits of weight loss and lower blood pressure, possible side effects including candidiasis and dosage regimen Given 30-day free trial coupon and also information on Farxiga along with its benefits  He should be able to monitor his blood pressure at home and let us know if it is relatively low, may stop losartan  Increase fluid intake  May stop Metformin once he runs out  No change in insulin as yet but if he feels like his sugars are lower after meals he can reduce boluses by 1 click  He needs to check his blood sugars regularly especially after meals and more than before  Resume exercise when able to  Short-term follow-up in 1 month     There are no Patient Instructions on file for this visit.     Elayne Snare 03/23/2020, 9:40 AM   Note: This office note was prepared with Dragon voice recognition system technology. Any transcriptional errors that result from this process are unintentional.

## 2020-04-12 ENCOUNTER — Telehealth: Payer: Self-pay | Admitting: Internal Medicine

## 2020-04-12 NOTE — Progress Notes (Signed)
  Chronic Care Management   Note  04/12/2020 Name: Tod Scharr MRN: [id number] DOB: 1946-06-16  Baylee Whitehall is a 74 y.o. year old male who is a primary care patient of Janith Lima, MD. I reached out to [name] by phone today in response to a referral sent by Mr. Kevondre Mallek's PCP, Janith Lima, MD.   Mr. Rodenberger was given information about Chronic Care Management services today including:  1. CCM service includes personalized support from designated clinical staff supervised by his physician, including individualized plan of care and coordination with other care providers 2. 24/7 contact phone numbers for assistance for urgent and routine care needs. 3. Service will only be billed when office clinical staff spend 20 minutes or more in a month to coordinate care. 4. Only one practitioner may furnish and bill the service in a calendar month. 5. The patient may stop CCM services at any time (effective at the end of the month) by phone call to the office staff.   Patient agreed to services and verbal consent obtained.    This note is not being shared with the patient for the following reason: To respect privacy (The patient or proxy has requested that the information not be shared).  Follow up plan:   Raynicia Dukes [employer] Scheduler

## 2020-05-01 ENCOUNTER — Other Ambulatory Visit: Payer: Self-pay

## 2020-05-01 ENCOUNTER — Other Ambulatory Visit (INDEPENDENT_AMBULATORY_CARE_PROVIDER_SITE_OTHER): Payer: Medicare Other

## 2020-05-01 DIAGNOSIS — E1165 Type 2 diabetes mellitus with hyperglycemia: Secondary | ICD-10-CM

## 2020-05-01 DIAGNOSIS — Z794 Long term (current) use of insulin: Secondary | ICD-10-CM | POA: Diagnosis not present

## 2020-05-01 LAB — BASIC METABOLIC PANEL
BUN: 19 mg/dL (ref 6–23)
CO2: 24 mEq/L (ref 19–32)
Calcium: 9.1 mg/dL (ref 8.4–10.5)
Chloride: 107 mEq/L (ref 96–112)
Creatinine, Ser: 1.03 mg/dL (ref 0.40–1.50)
GFR: 70.36 mL/min (ref >60.00)
Glucose, Bld: 144 mg/dL — ABNORMAL HIGH (ref 70–99)
Potassium: 4.5 mEq/L (ref 3.5–5.1)
Sodium: 139 mEq/L (ref 135–145)

## 2020-05-02 ENCOUNTER — Other Ambulatory Visit: Payer: Medicare Other

## 2020-05-02 LAB — FRUCTOSAMINE: Fructosamine: 225 umol/L (ref 0–285)

## 2020-05-04 ENCOUNTER — Other Ambulatory Visit: Payer: Self-pay

## 2020-05-04 ENCOUNTER — Ambulatory Visit (INDEPENDENT_AMBULATORY_CARE_PROVIDER_SITE_OTHER): Payer: Medicare Other | Admitting: Endocrinology

## 2020-05-04 ENCOUNTER — Other Ambulatory Visit: Payer: Self-pay | Admitting: Endocrinology

## 2020-05-04 ENCOUNTER — Encounter: Payer: Self-pay | Admitting: Endocrinology

## 2020-05-04 VITALS — BP 120/74 | HR 70 | Ht 72.0 in | Wt 208.6 lb

## 2020-05-04 DIAGNOSIS — I10 Essential (primary) hypertension: Secondary | ICD-10-CM | POA: Diagnosis not present

## 2020-05-04 DIAGNOSIS — E1165 Type 2 diabetes mellitus with hyperglycemia: Secondary | ICD-10-CM | POA: Diagnosis not present

## 2020-05-04 DIAGNOSIS — Z794 Long term (current) use of insulin: Secondary | ICD-10-CM

## 2020-05-04 MED ORDER — DAPAGLIFLOZIN PROPANEDIOL 10 MG PO TABS: 10.0000 mg | ORAL_TABLET | Freq: Every day | ORAL | 3 refills | Status: DC

## 2020-05-04 NOTE — Progress Notes (Signed)
Patient ID: Alan Lopez, male   DOB: 15-Dec-1945, 74 y.o.   MRN: [id number]            Reason for Appointment: Follow-up for Type 2 Diabetes   History of Present Illness:          Date of diagnosis of type 2 diabetes mellitus :  1999      Past history:  He had modestly increase blood sugars at diagnosis and was started on metformin Subsequently glipizide was added to improve his control. He thinks that his blood sugars have been mostly poorly controlled over the last several years He had been on glipizide and metformin for years without any adjustment of his dosage of glipizide  Review of his A1c shows that it had ranged from 9-9.5 since 07/2013 Previously his PCP tried him on a sample of Jardiance 10 mg without significant improvement  He was started on the V-go pump on 07/02/17 because of inconsistent control and poor compliance with mealtime insulin  Recent history:   Oral hypoglycemic drugs the patient is taking are: Actos 15 mg, Farxiga 5 mg daily  Current INSULIN regimen: V-go pump SETTINGS: Basal rate = 30 units BOLUSES = 3-4 units at breakfast, 8 at lunch and  8-10 at dinner  In April his A1c had gone up to 7.4 from 7.1, previously 6.5-8.9 Now fructosamine is 25   Current blood sugar patterns and problems identified:  He did started taking [place] as directed about a month ago  However he thinks that it causes increased appetite which he tries to control with eating some crackers or other snacks  Otherwise he thinks that it may be causing occasional diarrhea with frequent bowel movements. Previously had assumed that it was Metformin that was causing bowel habit changes  Metformin was stopped  He also is having more recently some rash on his forearms with mild itching  Currently concerned about the high cost of Farxiga especially in the donut hole  Although he has not changed his bolus insulin doses with starting [place] last night he felt hypoglycemic during  the night  Last evening he took 8 units bolus even though he had only a small portion of bread as carbohydrate  Overall he is fairly active with yard work  Weight is about the same  No hypoglycemia during the day  As usual he is checking blood sugars mostly in the morning and not enough readings after meals  He is changing his pump at the same time in the morning as before  Side effects from medications have been: Mild  diarrhea from regular metformin  Compliance with the medical regimen: fair   Hypoglycemia: never   Glucose monitoring:  done once a day usually       Glucometer:   Contour    PRE-MEAL Fasting Lunch Dinner Bedtime Overall  Glucose range:  98-192   ?  97-164   Mean/median:  136    135 136   POST-MEAL PC Breakfast PC Lunch PC Dinner  Glucose range:  122   Mean/median:      Previous data:  PRE-MEAL Fasting Lunch Dinner Bedtime Overall  Glucose range: 95-182   91    Mean/median: 152       POST-MEAL PC Breakfast PC Lunch PC Dinner  Glucose range:  218   207  Mean/median:          Self-care: The diet that the patient has been following is: tries to limit high carbohydrate foods. eating out  about 4 times a week     Typical meal intake: Breakfast is  Toast/meat .  Usually 2 servings of vegetables in the evening and avoiding potatoes and bread usually Usually avoiding drinks with sugar                Dietician visit, most recent: 05/2015  CDE visit: 01/2016  Weight history: Maximum weight 215 previously  Wt Readings from Last 3 Encounters:  05/04/20 208 lb 9.6 oz (94.6 kg)  03/23/20 208 lb 9.6 oz (94.6 kg)  12/23/19 210 lb 6.4 oz (95.4 kg)    Glycemic control:    Lab Results  Component Value Date   HGBA1C 7.4 (H) 03/21/2020   HGBA1C 7.1 (H) 12/20/2019   HGBA1C 6.5 08/05/2019   Lab Results  Component Value Date   MICROALBUR 1.9 12/20/2019   LDLCALC 38 12/20/2019   CREATININE 1.03 05/01/2020    Lab on 05/01/2020  Component Date Value Ref  Range Status  . Fructosamine 05/01/2020 225  0 - 285 umol/L Final   Comment: Published reference interval for apparently healthy subjects between age 13 and 43 is 51 - 285 umol/L and in a poorly controlled diabetic population is 228 - 563 umol/L with a mean of 396 umol/L.   Marland Kitchen Sodium 05/01/2020 139  135 - 145 mEq/L Final  . Potassium 05/01/2020 4.5  3.5 - 5.1 mEq/L Final  . Chloride 05/01/2020 107  96 - 112 mEq/L Final  . CO2 05/01/2020 24  19 - 32 mEq/L Final  . Glucose, Bld 05/01/2020 144* 70 - 99 mg/dL Final  . BUN 05/01/2020 19  6 - 23 mg/dL Final  . Creatinine, Ser 05/01/2020 1.03  0.40 - 1.50 mg/dL Final  . GFR 05/01/2020 70.36  >60.00 mL/min Final  . Calcium 05/01/2020 9.1  8.4 - 10.5 mg/dL Final      Allergies as of 05/04/2020      Reactions   Atorvastatin Other (See Comments)   Codeine Other (See Comments)   Patient doesn't like how it makes him feel, "spaced out, not together"      Medication List       Accurate as of May 04, 2020  8:56 AM. If you have any questions, ask your nurse or doctor.        STOP taking these medications   metFORMIN 500 MG 24 hr tablet Commonly known as: GLUCOPHAGE-XR Stopped by: Elayne Snare, MD     TAKE these medications   alfuzosin 10 MG 24 hr tablet Commonly known as: UROXATRAL Take 10 mg by mouth daily with breakfast.   aspirin EC 81 MG tablet Take 81 mg by mouth daily.   cholecalciferol 1000 units tablet Commonly known as: VITAMIN D Take 1,000 Units by mouth daily.   Contour Next Test test strip Generic drug: glucose blood USE TO TEST BLOOD SUGAR 2-3 TIMES DAILY   Farxiga 5 MG Tabs tablet Generic drug: dapagliflozin propanediol Take 5 mg by mouth daily.   finasteride 5 MG tablet Commonly known as: PROSCAR Take 5 mg by mouth daily.   fluticasone 50 MCG/ACT nasal spray Commonly known as: FLONASE Place 2 sprays into both nostrils daily as needed for allergies (for seasonal allergies). Reported on 01/29/2016    freestyle lancets Use to test blood sugar once daily Dx code E11.8   Insulin Pen Needle 32G X 4 MM Misc Use Three per day to inject insulin   insulin regular 100 units/mL injection Commonly known as: NOVOLIN R Inject into the  skin 3 (three) times daily before meals. USE INSULIN TO FILL UP v-GO PUMP   losartan 25 MG tablet Commonly known as: COZAAR TAKE ONE HALF TABLET BY MOUTH DAILY   meloxicam 7.5 MG tablet Commonly known as: MOBIC TAKE ONE TABLET BY MOUTH DAILY   pioglitazone 15 MG tablet Commonly known as: ACTOS TAKE ONE TABLET BY MOUTH DAILY   rosuvastatin 10 MG tablet Commonly known as: Crestor Take 1 tablet (10 mg total) by mouth at bedtime.   V-Go 30 Kit APPLY AS INSTRUCTED       Allergies:  Allergies  Allergen Reactions  . Atorvastatin Other (See Comments)  . Codeine Other (See Comments)    Patient doesn't like how it makes him feel, "spaced out, not together"    Past Medical History:  Diagnosis Date  . 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 HTN (hypertension)   . Hypertrophy (benign) of prostate   . Multinodular goiter 04/12/2015  . Non-insulin dependent type 2 diabetes mellitus ([place])   . Status post non-ST elevation myocardial infarction (NSTEMI)    CATH  . Tobacco abuse   . Type II diabetes mellitus, uncontrolled ([place]) 04/12/2015    Past Surgical History:  Procedure Laterality Date  . CORONARY ANGIOPLASTY WITH STENT PLACEMENT      Family History  Problem Relation Age of Onset  . Diabetes Mother   . Diabetes Brother   . Cancer Brother   . Heart disease Neg Hx     Social History:  reports that he has been smoking cigarettes. He has never used smokeless tobacco. He reports current alcohol use of about 10.0 standard drinks of alcohol per week. He reports that he does not use drugs.    Review of Systems     Lipid history:   Has history of CAD and myocardial infarction  Continues on 10 mg Crestor  LDL is below 70 Labs as follows:   Lab  Results  Component Value Date   CHOL 110 12/20/2019   HDL 62.00 12/20/2019   LDLCALC 38 12/20/2019   TRIG 52.0 12/20/2019   CHOLHDL 2 12/20/2019           Eyes: He has had blindness in his left eye from old injury   His last eye exam was in 12/2018  THYROID: Previously was found to have a 2 cm nodule on his left thyroid on his exam but ultrasound in 04/2015 showed multinodular goiter with mostly small nodules and the largest nodule on the left side of 1.4 cm has the appearance of a pseudo- nodule  Thyroid levels have been normal  Lab Results  Component Value Date   TSH 2.14 03/21/2020    NEUROPATHY: Has feeling of numbness on the soles of his feet which is not any worse Sometimes will have some pains in the feet if standing for long He has no numbness in the toes No burning or tingling His last foot exam by PCP did not show any sensory loss     LABS:  Lab on 05/01/2020  Component Date Value Ref Range Status  . Fructosamine 05/01/2020 225  0 - 285 umol/L Final   Comment: Published reference interval for apparently healthy subjects between age 51 and 68 is 42 - 285 umol/L and in a poorly controlled diabetic population is 228 - 563 umol/L with a mean of 396 umol/L.   Marland Kitchen Sodium 05/01/2020 139  135 - 145 mEq/L Final  . Potassium 05/01/2020 4.5  3.5 - 5.1 mEq/L Final  .  Chloride 05/01/2020 107  96 - 112 mEq/L Final  . CO2 05/01/2020 24  19 - 32 mEq/L Final  . Glucose, Bld 05/01/2020 144* 70 - 99 mg/dL Final  . BUN 05/01/2020 19  6 - 23 mg/dL Final  . Creatinine, Ser 05/01/2020 1.03  0.40 - 1.50 mg/dL Final  . GFR 05/01/2020 70.36  >60.00 mL/min Final  . Calcium 05/01/2020 9.1  8.4 - 10.5 mg/dL Final    Physical Examination:  BP 120/74 (BP Location: Left Arm, Patient Position: Sitting, Cuff Size: Normal)   Pulse 70   Ht 6' (1.829 m)   Wt 208 lb 9.6 oz (94.6 kg)   SpO2 96%   BMI 28.29 kg/m     ASSESSMENT:  Diabetes type 2, on insulin  See history of present  illness for detailed discussion of his current management, blood sugar patterns and problems identified  A1c previously was higher at 7.4 Fructosamine is 225  With substituting Farxiga for 500 mg Metformin his blood sugars are significantly better His morning sugars are more consistently improved and also appears to have excellent readings nonfasting although not checking enough He did have symptoms of hypoglycemia overnight last night from not reducing his bolus enough for low carbohydrate meal Otherwise weight has leveled off He does appear to be having a rash on his arms and not clear if this is from his [place] Currently concerned about the cost of the medication   Mild hypertension: Well-controlled and no change with adding Farxiga Renal function also unchanged  PLAN:   Continue Farxiga 5 mg daily in the morning, he can try cutting the 10 mg tablet in half However if he feels that he is continuing to have rash and itching on his arms may consider changing him to Invokana He needs to check his sugars more consistently after dinner to help adjust his bolus dosing Likely needs only 4 to 6 units for low carbohydrate meals at dinnertime To check readings after breakfast and lunch more often to find out how much he really needs Likely needs GI evaluation if he continues to have diarrhea  A1c on the next visit  Continue the same basal rate on the V-go pump and change at the same time daily     There are no Patient Instructions on file for this visit.     Elayne Snare 05/04/2020, 8:56 AM   Note: This office note was prepared with Dragon voice recognition system technology. Any transcriptional errors that result from this process are unintentional.

## 2020-05-04 NOTE — Patient Instructions (Signed)
Less clicks at dinner

## 2020-05-26 NOTE — Chronic Care Management (AMB) (Deleted)
Chronic Care Management Pharmacy  Name: Alan Lopez  MRN: [id number] DOB: 09-06-1946   Chief Complaint/ HPI  Alan Lopez,  74 y.o. , male presents for their Initial CCM visit with the clinical pharmacist via telephone due to COVID-19 Pandemic.  PCP : Janith Lima, MD  Their chronic conditions include: Hypertension, Hyperlipidemia, Diabetes, Coronary Artery Disease, Osteoarthritis and BPH   Office Visits: 04/13/2019  Consult Visit: 05/04/20: Patient presented to Dr. Dwyane Dee (Endocrinology) for T2DM follow-up. Metformin stopped, Farxiga increased to 10 mg daily, but patient instructed to maintain 5 mg daily. Patient with arm rash, unclear if due to [place] Patient concerned with med costs. 03/23/20: Patient presented to Dr. Dwyane Dee (Endocrinology) for T2DM follow-up. Patient started of Farxiga 5 mg daily.  12/23/19: Patient presented to Dr. Dwyane Dee (Endocrinology) for T2DM follow-up. No medication changes made.  Allergies  Allergen Reactions  . Atorvastatin Other (See Comments)  . Codeine Other (See Comments)    Patient doesn't like how it makes him feel, "spaced out, not together"    Medications: Outpatient Encounter Medications as of 05/29/2020  Medication Sig  . alfuzosin (UROXATRAL) 10 MG 24 hr tablet Take 10 mg by mouth daily with breakfast.   . aspirin EC 81 MG tablet Take 81 mg by mouth daily.  . cholecalciferol (VITAMIN D) 1000 UNITS tablet Take 1,000 Units by mouth daily.  . dapagliflozin propanediol (FARXIGA) 10 MG TABS tablet Take 1 tablet (10 mg total) by mouth daily.  . finasteride (PROSCAR) 5 MG tablet Take 5 mg by mouth daily.  . fluticasone (FLONASE) 50 MCG/ACT nasal spray Place 2 sprays into both nostrils daily as needed for allergies (for seasonal allergies). Reported on 01/29/2016  . glucose blood (CONTOUR NEXT TEST) test strip USE TO TEST BLOOD SUGAR 2-3 TIMES DAILY  . Insulin Disposable Pump (V-GO 30) KIT APPLY AS INSTRUCTED  . Insulin Pen Needle 32G X 4  MM MISC Use Three per day to inject insulin  . insulin regular (NOVOLIN R,HUMULIN R) 100 units/mL injection Inject into the skin 3 (three) times daily before meals. USE INSULIN TO FILL UP v-GO PUMP  . Lancets (FREESTYLE) lancets Use to test blood sugar once daily Dx code E11.8  . losartan (COZAAR) 25 MG tablet TAKE ONE HALF TABLET BY MOUTH DAILY  . meloxicam (MOBIC) 7.5 MG tablet TAKE ONE TABLET BY MOUTH DAILY  . pioglitazone (ACTOS) 15 MG tablet TAKE ONE TABLET BY MOUTH DAILY  . rosuvastatin (CRESTOR) 10 MG tablet Take 1 tablet (10 mg total) by mouth at bedtime.   No facility-administered encounter medications on file as of 05/29/2020.     Current Diagnosis/Assessment:    Goals Addressed   None    Diabetes   Diagnosed 1999  A1c goal {A1c goals:23924}  Recent Relevant Labs: Lab Results  Component Value Date/Time   HGBA1C 7.4 (H) 03/21/2020 08:32 AM   HGBA1C 7.1 (H) 12/20/2019 08:58 AM   GFR 70.36 05/01/2020 08:40 AM   GFR 78.21 03/21/2020 08:32 AM   MICROALBUR 1.9 12/20/2019 08:58 AM   MICROALBUR 1.5 09/15/2018 08:24 AM    Last diabetic Eye exam:  Lab Results  Component Value Date/Time   HMDIABEYEEXA No Retinopathy 01/07/2019 12:00 AM    Last diabetic Foot exam: No results found for: HMDIABFOOTEX   Checking BG: {CHL HP Blood Glucose Monitoring Frequency:[phone]}  Recent FBG Readings: *** Recent pre-meal BG readings: *** Recent 2hr PP BG readings:  *** Recent HS BG readings: ***  Patient has failed these meds in  past: Trulicity, glipizide, victoza, metformin, [place]  Patient is currently {CHL Controlled/Uncontrolled:670 552 0962} on the following medications: . Farxiga 10 mg daily  . Insulin Regular Inject 3 times daily before meals using V-GO Pump . Pioglitazone 15 mg daily  . V-go pump SETTINGS: Basal rate = 30 units BOLUSES = 3-4 units at breakfast, 8 at lunch and  8-10 at dinner  We discussed: {CHL HP Upstream Pharmacy  [phone]}  Plan  Continue {CHL HP Upstream Pharmacy Plans:5168371026}  Hypertension   BP goal is:  {CHL HP UPSTREAM Pharmacist BP ranges:[phone]}  Office blood pressures are  BP Readings from Last 3 Encounters:  05/04/20 120/74  03/23/20 122/70  12/23/19 140/68   CMP Latest Ref Rng & Units 05/01/2020 03/21/2020 12/20/2019  Glucose 70 - 99 mg/dL 144(H) 197(H) 164(H)  BUN 6 - 23 mg/dL _0 Creatinine 0.40 - 1.50 mg/dL 1.03 0.94 0.93  Sodium 135 - 145 mEq/L 139 136 138  Potassium 3.5 - 5.1 mEq/L 4.5 4.1 4.0  Chloride 96 - 112 mEq/L 107 106 108  CO2 19 - 32 mEq/L _1 Calcium 8.4 - 10.5 mg/dL 9.1 9.2 8.8  Total Protein 6.0 - 8.3 g/dL - 6.5 6.4  Total Bilirubin 0.2 - 1.2 mg/dL - 0.6 0.8  Alkaline Phos 39 - 117 U/L - 61 56  AST 0 - 37 U/L - 25 33  ALT 0 - 53 U/L - 32 38   Patient checks BP at home {CHL HP BP Monitoring Frequency:[phone]} Patient home BP readings are ranging: ***  Patient has failed these meds in the past: *** Patient is currently {CHL Controlled/Uncontrolled:670 552 0962} on the following medications:  . Losartan 25 mg 1/2 tablet daily  We discussed {CHL HP Upstream Pharmacy [phone]}  Plan  Continue {CHL HP Upstream Pharmacy Plans:5168371026}     Hyperlipidemia   History of CAD, followed by Dr. Marlou Porch (cardiology)  LDL goal < ***  Lipid Panel     Component Value Date/Time   CHOL 110 12/20/2019 0858   CHOL 120 01/18/2019 0846   TRIG 52.0 12/20/2019 0858   HDL 62.00 12/20/2019 0858   HDL 67 01/18/2019 0846   LDLCALC 38 12/20/2019 0858   LDLCALC 39 01/18/2019 0846    Hepatic Function Latest Ref Rng & Units 03/21/2020 12/20/2019 08/05/2019  Total Protein 6.0 - 8.3 g/dL 6.5 6.4 6.7  Albumin 3.5 - 5.2 g/dL 4.0 3.8 4.1  AST 0 - 37 U/L 25 33 24  ALT 0 - 53 U/L 32 38 27  Alk Phosphatase 39 - 117 U/L 61 56 48  Total Bilirubin 0.2 - 1.2 mg/dL 0.6 0.8 0.7  Bilirubin, Direct 0.00 - 0.40 mg/dL - - -     The ASCVD  Risk score Mikey Bussing DC Jr., et al., 2013) failed to calculate for the following reasons:   The patient has a prior MI or stroke diagnosis   Patient has failed these meds in past: *** Patient is currently {CHL Controlled/Uncontrolled:670 552 0962} on the following medications:  . Aspirin 81 mg daily . Rosuvastatin 10 mg QHS   We discussed:  {CHL HP Upstream Pharmacy [phone]}  Plan  Continue {CHL HP Upstream Pharmacy Plans:5168371026}  BPH   No results found for: PSA   Patient has failed these meds in past: *** Patient is currently {CHL Controlled/Uncontrolled:670 552 0962} on the following medications:  . Alfuzosin 10 mg daily  . Finasteride 5 mg daily   We discussed:  ***  Plan  Continue {CHL HP Upstream Pharmacy MIWOE:[id number]}  Osteoarthritis   Patient has failed these meds in past: *** Patient is currently {CHL Controlled/Uncontrolled:[phone]} on the following medications:  Marland Kitchen Meloxicam 7.5 mg daily   We discussed:  ***  Plan  Continue {CHL HP Upstream Pharmacy Plans:[phone]}  Misc / OTC   . Vitamin D 1000 units daily  . Flonase 50 mcg/act 2 spray daily PRN  We discussed:  ***  Plan  Continue {CHL HP Upstream Pharmacy BHALP:[id number]}  Vaccines   Reviewed and discussed patient's vaccination history.    Immunization History  Administered Date(s) Administered  . Fluad Quad(high Dose 65+) 10/05/2019  . Influenza, High Dose Seasonal PF 09/12/2016, 11/08/2017  . Influenza,inj,Quad PF,6+ Mos 09/22/2018  . Influenza-Unspecified 09/12/2014, 08/30/2015  . Pneumococcal Conjugate-13 09/13/2013  . Pneumococcal Polysaccharide-23 01/01/2018  . Tdap 07/21/2013  . Zoster 08/30/2015    Plan  Recommended patient receive *** vaccine in *** office/pharmacy.   Medication Management   Pt uses [facility] for all medications. There is a >5 fill gap for:  Alfuzosin (01/09/20 for 90DS) Finasteride (01/25/20 for 90DS) Losartan (01/19/20 for  90DS) Pioglitazone (01/20/20 for 90DS) Rosuvastatin (01/04/20 for 90DS; 04/09/20 for 90DS) Metformin (01/20/20 for 90DS) Uses pill box? {Yes or If no, why not?:20788} Pt endorses ***% compliance  We discussed: ***  Plan  {US Pharmacy ZHGD:92426}    Follow up: *** month phone visit  ***

## 2020-05-26 NOTE — Chronic Care Management (AMB) (Deleted)
Chronic Care Management Pharmacy  Name: Alan Lopez  MRN: [id number] DOB: Feb 25, 1946   Chief Complaint/ HPI  Alan Lopez,  74 y.o. , male presents for their Initial CCM visit with the clinical pharmacist via telephone due to COVID-19 Pandemic.  PCP : Janith Lima, MD  Their chronic conditions include: Hypertension, Hyperlipidemia, Diabetes, Coronary Artery Disease, Osteoarthritis and BPH   Office Visits: ***  Consult Visit: 05/04/20: Patient presented to Dr. Dwyane Dee (Endocrinology) for T2DM follow-up. Metformin stopped, Farxiga increased to 10 mg daily, but patient instructed to maintain 5 mg daily. Patient with arm rash, unclear if due to [place] Patient concerned with med costs. 03/23/20: Patient presented to Dr. Dwyane Dee (Endocrinology) for T2DM follow-up. Patient started of Farxiga 5 mg daily.  12/23/19: Patient presented to Dr. Dwyane Dee (Endocrinology) for T2DM follow-up. No medication changes made.  Allergies  Allergen Reactions  . Atorvastatin Other (See Comments)  . Codeine Other (See Comments)    Patient doesn't like how it makes him feel, "spaced out, not together"    Medications: Outpatient Encounter Medications as of 05/29/2020  Medication Sig  . alfuzosin (UROXATRAL) 10 MG 24 hr tablet Take 10 mg by mouth daily with breakfast.   . aspirin EC 81 MG tablet Take 81 mg by mouth daily.  . cholecalciferol (VITAMIN D) 1000 UNITS tablet Take 1,000 Units by mouth daily.  . dapagliflozin propanediol (FARXIGA) 10 MG TABS tablet Take 1 tablet (10 mg total) by mouth daily.  . finasteride (PROSCAR) 5 MG tablet Take 5 mg by mouth daily.  . fluticasone (FLONASE) 50 MCG/ACT nasal spray Place 2 sprays into both nostrils daily as needed for allergies (for seasonal allergies). Reported on 01/29/2016  . glucose blood (CONTOUR NEXT TEST) test strip USE TO TEST BLOOD SUGAR 2-3 TIMES DAILY  . Insulin Disposable Pump (V-GO 30) KIT APPLY AS INSTRUCTED  . Insulin Pen Needle 32G X 4 MM  MISC Use Three per day to inject insulin  . insulin regular (NOVOLIN R,HUMULIN R) 100 units/mL injection Inject into the skin 3 (three) times daily before meals. USE INSULIN TO FILL UP v-GO PUMP  . Lancets (FREESTYLE) lancets Use to test blood sugar once daily Dx code E11.8  . losartan (COZAAR) 25 MG tablet TAKE ONE HALF TABLET BY MOUTH DAILY  . meloxicam (MOBIC) 7.5 MG tablet TAKE ONE TABLET BY MOUTH DAILY  . pioglitazone (ACTOS) 15 MG tablet TAKE ONE TABLET BY MOUTH DAILY  . rosuvastatin (CRESTOR) 10 MG tablet Take 1 tablet (10 mg total) by mouth at bedtime.   No facility-administered encounter medications on file as of 05/29/2020.     Current Diagnosis/Assessment:    Goals Addressed   None    Diabetes   Diagnosed 1999  A1c goal {A1c goals:23924}  Recent Relevant Labs: Lab Results  Component Value Date/Time   HGBA1C 7.4 (H) 03/21/2020 08:32 AM   HGBA1C 7.1 (H) 12/20/2019 08:58 AM   GFR 70.36 05/01/2020 08:40 AM   GFR 78.21 03/21/2020 08:32 AM   MICROALBUR 1.9 12/20/2019 08:58 AM   MICROALBUR 1.5 09/15/2018 08:24 AM    Last diabetic Eye exam:  Lab Results  Component Value Date/Time   HMDIABEYEEXA No Retinopathy 01/07/2019 12:00 AM    Last diabetic Foot exam: No results found for: HMDIABFOOTEX   Checking BG: {CHL HP Blood Glucose Monitoring [phone]}  Recent FBG Readings: *** Recent pre-meal BG readings: *** Recent 2hr PP BG readings:  *** Recent HS BG readings: ***  Patient has failed these meds in  past: Trulicity, glipizide, victoza, metformin, [place]  Patient is currently {CHL Controlled/Uncontrolled:[phone]} on the following medications: . Farxiga 10 mg daily  . Insulin Regular Inject 3 times daily before meals using V-GO Pump . Pioglitazone 15 mg daily  . V-go pump SETTINGS: Basal rate = 30 units BOLUSES = 3-4 units at breakfast, 8 at lunch and  8-10 at dinner  We discussed: {CHL HP Upstream Pharmacy  [phone]}  Plan  Continue {CHL HP Upstream Pharmacy Plans:[id number]}  Hypertension   BP goal is:  {CHL HP UPSTREAM Pharmacist BP ranges:[phone]}  Office blood pressures are  BP Readings from Last 3 Encounters:  05/04/20 120/74  03/23/20 122/70  12/23/19 140/68   CMP Latest Ref Rng & Units 05/01/2020 03/21/2020 12/20/2019  Glucose 70 - 99 mg/dL 144(H) 197(H) 164(H)  BUN 6 - 23 mg/dL _0 Creatinine 0.40 - 1.50 mg/dL 1.03 0.94 0.93  Sodium 135 - 145 mEq/L 139 136 138  Potassium 3.5 - 5.1 mEq/L 4.5 4.1 4.0  Chloride 96 - 112 mEq/L 107 106 108  CO2 19 - 32 mEq/L _1 Calcium 8.4 - 10.5 mg/dL 9.1 9.2 8.8  Total Protein 6.0 - 8.3 g/dL - 6.5 6.4  Total Bilirubin 0.2 - 1.2 mg/dL - 0.6 0.8  Alkaline Phos 39 - 117 U/L - 61 56  AST 0 - 37 U/L - 25 33  ALT 0 - 53 U/L - 32 38   Patient checks BP at home {CHL HP BP Monitoring Frequency:[phone]} Patient home BP readings are ranging: ***  Patient has failed these meds in the past: *** Patient is currently {CHL Controlled/Uncontrolled:670 552 0962} on the following medications:  . Losartan 25 mg 1/2 tablet daily  We discussed {CHL HP Upstream Pharmacy [phone]}  Plan  Continue {CHL HP Upstream Pharmacy Plans:[id number]}     Hyperlipidemia   History of CAD, followed by Dr. Marlou Porch (cardiology)  LDL goal < ***  Lipid Panel     Component Value Date/Time   CHOL 110 12/20/2019 0858   CHOL 120 01/18/2019 0846   TRIG 52.0 12/20/2019 0858   HDL 62.00 12/20/2019 0858   HDL 67 01/18/2019 0846   LDLCALC 38 12/20/2019 0858   LDLCALC 39 01/18/2019 0846    Hepatic Function Latest Ref Rng & Units 03/21/2020 12/20/2019 08/05/2019  Total Protein 6.0 - 8.3 g/dL 6.5 6.4 6.7  Albumin 3.5 - 5.2 g/dL 4.0 3.8 4.1  AST 0 - 37 U/L 25 33 24  ALT 0 - 53 U/L 32 38 27  Alk Phosphatase 39 - 117 U/L 61 56 48  Total Bilirubin 0.2 - 1.2 mg/dL 0.6 0.8 0.7  Bilirubin, Direct 0.00 - 0.40 mg/dL - - -     The ASCVD  Risk score Mikey Bussing DC Jr., et al., 2013) failed to calculate for the following reasons:   The patient has a prior MI or stroke diagnosis   Patient has failed these meds in past: *** Patient is currently {CHL Controlled/Uncontrolled:670 552 0962} on the following medications:  . Aspirin 81 mg daily . Rosuvastatin 10 mg QHS   We discussed:  {CHL HP Upstream Pharmacy [phone]}  Plan  Continue {CHL HP Upstream Pharmacy Plans:[id number]}  BPH   No results found for: PSA   Patient has failed these meds in past: *** Patient is currently {CHL Controlled/Uncontrolled:670 552 0962} on the following medications:  . Alfuzosin 10 mg daily  . Finasteride 5 mg daily   We discussed:  ***  Plan  Continue {CHL HP Upstream Pharmacy MIWOE:[id number]}  Osteoarthritis   Patient has failed these meds in past: *** Patient is currently {CHL Controlled/Uncontrolled:[phone]} on the following medications:  Marland Kitchen Meloxicam 7.5 mg daily   We discussed:  ***  Plan  Continue {CHL HP Upstream Pharmacy Plans:[phone]}  Misc / OTC   . Vitamin D 1000 units daily  . Flonase 50 mcg/act 2 spray daily PRN  We discussed:  ***  Plan  Continue {CHL HP Upstream Pharmacy BHALP:[id number]}  Vaccines   Reviewed and discussed patient's vaccination history.    Immunization History  Administered Date(s) Administered  . Fluad Quad(high Dose 65+) 10/05/2019  . Influenza, High Dose Seasonal PF 09/12/2016, 11/08/2017  . Influenza,inj,Quad PF,6+ Mos 09/22/2018  . Influenza-Unspecified 09/12/2014, 08/30/2015  . Pneumococcal Conjugate-13 09/13/2013  . Pneumococcal Polysaccharide-23 01/01/2018  . Tdap 07/21/2013  . Zoster 08/30/2015    Plan  Recommended patient receive *** vaccine in *** office/pharmacy.   Medication Management   Pt uses [facility] for all medications. There is a >5 fill gap for:  Alfuzosin (01/09/20 for 90DS) Finasteride (01/25/20 for 90DS) Losartan (01/19/20 for  90DS) Pioglitazone (01/20/20 for 90DS) Rosuvastatin (01/04/20 for 90DS; 04/09/20 for 90DS) Metformin (01/20/20 for 90DS) Uses pill box? {Yes or If no, why not?:20788} Pt endorses ***% compliance  We discussed: ***  Plan  {US Pharmacy ZHGD:92426}    Follow up: *** month phone visit  ***

## 2020-05-29 ENCOUNTER — Telehealth: Payer: Medicare Other

## 2020-06-01 ENCOUNTER — Other Ambulatory Visit: Payer: Self-pay

## 2020-06-01 DIAGNOSIS — R972 Elevated prostate specific antigen [PSA]: Secondary | ICD-10-CM

## 2020-06-05 ENCOUNTER — Ambulatory Visit (INDEPENDENT_AMBULATORY_CARE_PROVIDER_SITE_OTHER): Payer: Medicare Other | Admitting: Internal Medicine

## 2020-06-05 ENCOUNTER — Encounter: Payer: Self-pay | Admitting: Internal Medicine

## 2020-06-05 ENCOUNTER — Other Ambulatory Visit: Payer: Self-pay

## 2020-06-05 VITALS — BP 144/78 | HR 75 | Temp 97.8°F | Resp 16 | Ht 72.0 in | Wt 207.0 lb

## 2020-06-05 DIAGNOSIS — R197 Diarrhea, unspecified: Secondary | ICD-10-CM | POA: Insufficient documentation

## 2020-06-05 DIAGNOSIS — Z91038 Other insect allergy status: Secondary | ICD-10-CM | POA: Diagnosis not present

## 2020-06-05 DIAGNOSIS — Z1211 Encounter for screening for malignant neoplasm of colon: Secondary | ICD-10-CM

## 2020-06-05 LAB — SEDIMENTATION RATE: Sed Rate: 31 mm/hr — ABNORMAL HIGH (ref 0–20)

## 2020-06-05 MED ORDER — FLUOCINONIDE-E 0.05 % EX CREA: 1.0000 "application " | TOPICAL_CREAM | Freq: Two times a day (BID) | CUTANEOUS | 0 refills | Status: DC

## 2020-06-05 NOTE — Progress Notes (Signed)
Subjective:  Patient ID: Alan Lopez, male    DOB: Mar 29, 1946  Age: 74 y.o. MRN: [id number]  CC: Hypertension  This visit occurred during the SARS-CoV-2 public health emergency.  Safety protocols were in place, including screening questions prior to the visit, additional usage of staff PPE, and extensive cleaning of exam room while observing appropriate contact time as indicated for disinfecting solutions.    HPI Alan Lopez presents for f/up -   1.  He complains of a tick bite on his right lower leg.  It occurred about 2 or 3 days ago.  He removed the tick in its entirety.  The area itches.  He has been applying Neosporin cream without much improvement in the symptoms.  2.  He complains of a 1-1/2-year history of diarrhea.  He has up to 4-5 loose stools a day.  He gets minimal symptom relief with Imodium.  He denies abdominal pain, cramping, loss of appetite, weight loss, melena, or bright red blood per rectum.  Outpatient Medications Prior to Visit  Medication Sig Dispense Refill  . alfuzosin (UROXATRAL) 10 MG 24 hr tablet Take 10 mg by mouth daily with breakfast.   11  . aspirin EC 81 MG tablet Take 81 mg by mouth daily.    . cholecalciferol (VITAMIN D) 1000 UNITS tablet Take 1,000 Units by mouth daily.    . dapagliflozin propanediol (FARXIGA) 10 MG TABS tablet Take 1 tablet (10 mg total) by mouth daily. 30 tablet 3  . finasteride (PROSCAR) 5 MG tablet Take 5 mg by mouth daily.  3  . fluticasone (FLONASE) 50 MCG/ACT nasal spray Place 2 sprays into both nostrils daily as needed for allergies (for seasonal allergies). Reported on 01/29/2016    . glucose blood (CONTOUR NEXT TEST) test strip USE TO TEST BLOOD SUGAR 2-3 TIMES DAILY 200 each 1  . Insulin Disposable Pump (V-GO 30) KIT APPLY AS INSTRUCTED 30 kit 2  . Insulin Pen Needle 32G X 4 MM MISC Use Three per day to inject insulin 100 each 1  . insulin regular (NOVOLIN R,HUMULIN R) 100 units/mL injection Inject into the  skin 3 (three) times daily before meals. USE INSULIN TO FILL UP v-GO PUMP    . losartan (COZAAR) 25 MG tablet TAKE ONE HALF TABLET BY MOUTH DAILY 45 tablet 3  . meloxicam (MOBIC) 7.5 MG tablet TAKE ONE TABLET BY MOUTH DAILY 90 tablet 0  . pioglitazone (ACTOS) 15 MG tablet TAKE ONE TABLET BY MOUTH DAILY 90 tablet 2  . rosuvastatin (CRESTOR) 10 MG tablet Take 1 tablet (10 mg total) by mouth at bedtime. 90 tablet 3  . Lancets (FREESTYLE) lancets Use to test blood sugar once daily Dx code E11.8 100 each 1   No facility-administered medications prior to visit.    ROS Review of Systems  Constitutional: Negative.  Negative for appetite change, diaphoresis, fatigue and unexpected weight change.  HENT: Negative.   Eyes: Negative for visual disturbance.  Respiratory: Negative for cough, chest tightness, shortness of breath and wheezing.   Cardiovascular: Negative for chest pain, palpitations and leg swelling.  Gastrointestinal: Positive for diarrhea. Negative for abdominal pain, constipation, nausea and vomiting.  Endocrine: Negative.   Genitourinary: Negative.  Negative for difficulty urinating and dysuria.  Musculoskeletal: Negative for arthralgias, joint swelling and myalgias.  Skin: Negative.  Negative for color change.  Neurological: Negative.  Negative for dizziness, weakness and light-headedness.  Hematological: Negative for adenopathy. Does not bruise/bleed easily.  Psychiatric/Behavioral: Negative.  Objective:  BP (!) 144/78 (BP Location: Left Arm, Patient Position: Sitting, Cuff Size: Normal)   Pulse 75   Temp 97.8 F (36.6 C) (Oral)   Resp 16   Ht 6' (1.829 m)   Wt 207 lb (93.9 kg)   SpO2 93%   BMI 28.07 kg/m   BP Readings from Last 3 Encounters:  06/05/20 (!) 144/78  05/04/20 120/74  03/23/20 122/70    Wt Readings from Last 3 Encounters:  06/05/20 207 lb (93.9 kg)  05/04/20 208 lb 9.6 oz (94.6 kg)  03/23/20 208 lb 9.6 oz (94.6 kg)    Physical Exam Vitals  reviewed.  Constitutional:      Appearance: Normal appearance.  HENT:     Nose: Nose normal.     Mouth/Throat:     Mouth: Mucous membranes are moist.  Eyes:     General: No scleral icterus.    Conjunctiva/sclera: Conjunctivae normal.  Cardiovascular:     Rate and Rhythm: Normal rate and regular rhythm.     Heart sounds: No murmur heard.   Pulmonary:     Effort: Pulmonary effort is normal.     Breath sounds: No stridor. No wheezing, rhonchi or rales.  Abdominal:     General: Abdomen is flat. Bowel sounds are normal. There is no distension.     Palpations: Abdomen is soft. There is no hepatomegaly, splenomegaly or mass.     Tenderness: There is no abdominal tenderness.     Hernia: No hernia is present.  Musculoskeletal:     Cervical back: Neck supple.  Lymphadenopathy:     Cervical: No cervical adenopathy.  Skin:    Comments: Right lower lateral leg shows an erythematous macule with central excoriation.  There is no foreign body, no induration, no fluctuance, no pustules, no exudate, no streaking.  Neurological:     Mental Status: He is alert.     Lab Results  Component Value Date   WBC 8.3 04/13/2019   HGB 14.9 04/13/2019   HCT 42.8 04/13/2019   PLT 181.0 04/13/2019   GLUCOSE 144 (H) 05/01/2020   CHOL 110 12/20/2019   TRIG 52.0 12/20/2019   HDL 62.00 12/20/2019   LDLCALC 38 12/20/2019   ALT 32 03/21/2020   AST 25 03/21/2020   NA 139 05/01/2020   K 4.5 05/01/2020   CL 107 05/01/2020   CREATININE 1.03 05/01/2020   BUN 19 05/01/2020   CO2 24 05/01/2020   TSH 2.14 03/21/2020   HGBA1C 7.4 (H) 03/21/2020   MICROALBUR 1.9 12/20/2019    DG Shoulder Right  Result Date: 01/11/2019 CLINICAL DATA:  Chronic right shoulder pain. EXAM: RIGHT SHOULDER - 2+ VIEW COMPARISON:  None. FINDINGS: No acute fracture or dislocation. Mild acromioclavicular joint space narrowing. The glenohumeral joint space is relatively preserved. Osteopenia. Soft tissues are unremarkable. IMPRESSION:  1. Mild acromioclavicular osteoarthritis. Electronically Signed   By: Titus Dubin M.D.   On: 01/11/2019 17:23   DG HIP UNILAT WITH PELVIS 2-3 VIEWS RIGHT  Result Date: 01/11/2019 CLINICAL DATA:  Chronic right hip pain. EXAM: DG HIP (WITH OR WITHOUT PELVIS) 2-3V RIGHT COMPARISON:  None. FINDINGS: There are tiny marginal osteophytes on the femoral head. There are enthesophytes at the gluteal tendon insertions on the greater trochanter of the proximal right femur. Pelvic bones appear normal. Joint spaces preserved. IMPRESSION: Minimal degenerative changes of the right hip as described. No acute abnormalities. Electronically Signed   By: Lorriane Shire M.D.   On: 01/11/2019 17:23  Assessment & Plan:   Murl was seen today for hypertension.  Diagnoses and all orders for this visit:  Colon cancer screening -     Cologuard  Diarrhea, unspecified type- He has chronic diarrhea.  I have asked him to submit stool studies to screen for white cells, parasites, and pancreatic insufficiency.  Will screen for celiac disease.  If all of these are negative then will consider referral to GI to see if he needs to undergo colonoscopy to screen for colitis. -     Gliadin antibodies, serum; Future -     Tissue transglutaminase, IgA; Future -     Reticulin Antibody, IgA w reflex titer; Future -     Sedimentation rate; Future -     Fecal lactoferrin, quant; Future -     Giardia/cryptosporidium (EIA); Future -     Pancreatic elastase, fecal; Future -     Sedimentation rate -     Reticulin Antibody, IgA w reflex titer -     Tissue transglutaminase, IgA -     Gliadin antibodies, serum  Allergic reaction to insect bite- His symptoms and exam are consistent with an allergic reaction to the tick bite.  I recommended that he apply a topical steroid to the area. -     fluocinonide-emollient (LIDEX-E) 0.05 % cream; Apply 1 application topically 2 (two) times daily.   I am having [name] "bill" start  on fluocinonide-emollient. I am also having him maintain his finasteride, cholecalciferol, aspirin EC, fluticasone, insulin regular, alfuzosin, meloxicam, Contour Next Test, Insulin Pen Needle, freestyle, losartan, rosuvastatin, V-Go 30, dapagliflozin propanediol, and pioglitazone.  Meds ordered this encounter  Medications  . fluocinonide-emollient (LIDEX-E) 0.05 % cream    Sig: Apply 1 application topically 2 (two) times daily.    Dispense:  30 g    Refill:  0     Follow-up: Return in about 3 months (around 09/05/2020).  [name], MD

## 2020-06-05 NOTE — Patient Instructions (Signed)
Chronic Diarrhea Chronic diarrhea is a condition in which a person passes frequent loose and watery stools for 4 weeks or longer. Non-chronic diarrhea usually lasts for only 2-3 days. Diarrhea can cause a person to feel weak and dehydrated. Dehydration can make the person tired and thirsty. It can also cause a dry mouth, decreased urination, and dark yellow urine. Diarrhea is a sign of an underlying problem, such as:  Infection.  Side effects of medicines.  Problems digesting something in your diet, such as milk products if you have lactose intolerance.  Conditions such as celiac disease, irritable bowel syndrome (IBS), or inflammatory bowel disease (IBD). If you have chronic diarrhea, make sure you treat it as told by your health care provider. Follow these instructions at home: Medicines  Take over-the-counter and prescription medicines only as told by your health care provider.  If you were prescribed an antibiotic medicine, take it as told by your health care provider. Do not stop taking the antibiotic even if you start to feel better. Eating and drinking   Follow instructions from your health care provider about what to eat and drink. You may have to: ? Avoid foods that trigger diarrhea for you. ? Take an oral rehydration solution (ORS). This is a drink that keeps you hydrated. It can be found at pharmacies and retail stores. ? Drink clear fluids, such as water, diluted fruit juice, and low-calorie sports drinks. You can also get fluids by sucking on ice chips. ? Drink enough fluid to keep your urine pale yellow. This will help you avoid dehydration. ? Eat small amounts of bland foods that are easy to digest as you are able. These foods include bananas, applesauce, rice, lean meats, toast, and crackers. ? Avoid spicy or fatty foods. ? Avoid foods and beverages that contain a lot of sugar or caffeine.  Do not drink alcohol if: ? Your health care provider tells you not to  drink. ? You are pregnant, may be pregnant, or are planning to become pregnant.  If you drink alcohol: ? Limit how much you use to:  0-1 drink a day for women.  0-2 drinks a day for men. ? Be aware of how much alcohol is in your drink. In the [place] one drink equals one 12 oz bottle of beer (355 mL), one 5 oz glass of wine (148 mL), or one 1 oz glass of hard liquor (44 mL). General instructions   Wash your hands often and after each diarrhea episode. Use soap and water. If soap and water are not available, use hand sanitizer.  Make sure that all people in your household wash their hands well and often.  Rest as told by your health care provider.  Watch your condition for any changes.  Take a warm bath to relieve any burning or pain from frequent diarrhea episodes.  Keep all follow-up visits as told by your health care provider. This is important. Contact a health care provider if:  You have a fever.  Your diarrhea gets worse or does not get better.  You have new symptoms.  You cannot drink fluid without vomiting.  You feel light-headed or dizzy.  You have a headache.  You have muscle cramps.  You have severe pain in the rectum. Get help right away if:  You have vomiting that does not go away.  You have chest pain.  You feel very weak or you faint.  You have bloody or black stools, or stools that look like tar.    You have severe pain, cramping, or bloating in your abdomen, or pain that stays in one place.  You have trouble breathing or you are breathing very quickly.  Your heart is beating very quickly.  Your skin feels cold and clammy.  You feel confused.  You have a severe headache.  You have signs or symptoms of dehydration, such as: ? Dark urine, very little urine, or no urine. ? Cracked lips. ? Dry mouth. ? Sunken eyes. ? Sleepiness. ? Weakness. These symptoms may represent a serious problem that is an emergency. Do not wait to see if the  symptoms will go away. Get medical help right away. Call your local emergency services (911 in the [place] Do not drive yourself to the hospital. Summary  Chronic diarrhea is a condition in which a person passes frequent loose and watery stools for 4 weeks or longer.  Diarrhea is a sign of an underlying problem.  Make sure you treat your diarrhea as told by your health care provider.  Drink enough fluid to keep your urine pale yellow. This will help you avoid dehydration.  Wash your hands often and after each diarrhea episode. If soap and water are not available, use hand sanitizer. This information is not intended to replace advice given to you by your health care provider. Make sure you discuss any questions you have with your health care provider. Document Revised: 05/24/2019 Document Reviewed: 05/24/2019 Elsevier Patient Education  2020 [employer]  

## 2020-06-07 LAB — TISSUE TRANSGLUTAMINASE, IGA: (tTG) Ab, IgA: 1 U/mL

## 2020-06-07 LAB — GLIADIN ANTIBODIES, SERUM
Gliadin IgA: 4 Units
Gliadin IgG: 1 Units

## 2020-06-07 LAB — RETICULIN ANTIBODIES, IGA W TITER: Reticulin IGA Screen: NEGATIVE

## 2020-06-08 DIAGNOSIS — R197 Diarrhea, unspecified: Secondary | ICD-10-CM | POA: Diagnosis not present

## 2020-06-08 NOTE — Addendum Note (Signed)
Addended by: Trenda Moots on: 12/13/8725 11:53 AM   Modules accepted: Orders

## 2020-06-13 LAB — GIARDIA/CRYPTOSPORIDIUM (EIA)

## 2020-06-15 ENCOUNTER — Other Ambulatory Visit: Payer: Self-pay | Admitting: Internal Medicine

## 2020-06-15 DIAGNOSIS — K8681 Exocrine pancreatic insufficiency: Secondary | ICD-10-CM

## 2020-06-15 LAB — FECAL LACTOFERRIN, QUANT: LACTOFERRIN, QL, STOOL: NEGATIVE

## 2020-06-15 LAB — PANCREATIC ELASTASE, FECAL: Pancreatic Elastase-1, Stool: 142 mcg/g — ABNORMAL LOW

## 2020-06-15 MED ORDER — PANCRELIPASE (LIP-PROT-AMYL) 36000-114000 UNITS PO CPEP: 36000.0000 [IU] | ORAL_CAPSULE | Freq: Three times a day (TID) | ORAL | 1 refills | Status: DC

## 2020-06-22 ENCOUNTER — Telehealth: Payer: Self-pay | Admitting: Internal Medicine

## 2020-06-22 DIAGNOSIS — R972 Elevated prostate specific antigen [PSA]: Secondary | ICD-10-CM | POA: Diagnosis not present

## 2020-06-22 NOTE — Telephone Encounter (Signed)
    Pt c/o medication issue:  1. Name of Medication: lipase/protease/amylase (CREON) 36000 UNITS CPEP capsule  2. How are you currently taking this medication (dosage and times per day)? n/a  3. Are you having a reaction (difficulty breathing--STAT)? no  4. What is your medication issue? Patient calling to report lipase/protease/amylase (CREON) 36000 UNITS CPEP capsule is too costly ($260)

## 2020-06-23 LAB — PSA, TOTAL AND FREE
PSA, % Free: 4 % (calc) — ABNORMAL LOW (ref >25)
PSA, Free: 0.4 ng/mL
PSA, Total: 9.4 ng/mL — ABNORMAL HIGH (ref <=4.0)

## 2020-07-06 ENCOUNTER — Ambulatory Visit: Payer: Medicare Other | Admitting: Urology

## 2020-07-06 DIAGNOSIS — Z1211 Encounter for screening for malignant neoplasm of colon: Secondary | ICD-10-CM | POA: Diagnosis not present

## 2020-07-06 DIAGNOSIS — Z1212 Encounter for screening for malignant neoplasm of rectum: Secondary | ICD-10-CM | POA: Diagnosis not present

## 2020-07-11 LAB — COLOGUARD
COLOGUARD: NEGATIVE
Cologuard: NEGATIVE

## 2020-07-11 LAB — EXTERNAL GENERIC LAB PROCEDURE: COLOGUARD: NEGATIVE

## 2020-07-12 ENCOUNTER — Encounter: Payer: Self-pay | Admitting: Internal Medicine

## 2020-07-13 ENCOUNTER — Encounter: Payer: Self-pay | Admitting: Internal Medicine

## 2020-07-17 ENCOUNTER — Ambulatory Visit (INDEPENDENT_AMBULATORY_CARE_PROVIDER_SITE_OTHER): Payer: Medicare Other | Admitting: Urology

## 2020-07-17 ENCOUNTER — Encounter: Payer: Self-pay | Admitting: Urology

## 2020-07-17 ENCOUNTER — Other Ambulatory Visit: Payer: Self-pay

## 2020-07-17 VITALS — BP 108/66 | HR 89 | Temp 98.8°F | Ht 72.0 in | Wt 207.0 lb

## 2020-07-17 DIAGNOSIS — N138 Other obstructive and reflux uropathy: Secondary | ICD-10-CM

## 2020-07-17 DIAGNOSIS — N401 Enlarged prostate with lower urinary tract symptoms: Secondary | ICD-10-CM

## 2020-07-17 DIAGNOSIS — R972 Elevated prostate specific antigen [PSA]: Secondary | ICD-10-CM | POA: Diagnosis not present

## 2020-07-17 DIAGNOSIS — R351 Nocturia: Secondary | ICD-10-CM | POA: Insufficient documentation

## 2020-07-17 LAB — URINALYSIS, ROUTINE W REFLEX MICROSCOPIC
Bilirubin, UA: NEGATIVE
Ketones, UA: NEGATIVE
Leukocytes,UA: NEGATIVE
Nitrite, UA: NEGATIVE
Protein,UA: NEGATIVE
RBC, UA: NEGATIVE
Specific Gravity, UA: 1.015 (ref 1.005–1.030)
Urobilinogen, Ur: 0.2 mg/dL (ref 0.2–1.0)
pH, UA: 5.5 (ref 5.0–7.5)

## 2020-07-17 MED ORDER — FINASTERIDE 5 MG PO TABS: 5.0000 mg | ORAL_TABLET | Freq: Every day | ORAL | 3 refills | Status: DC

## 2020-07-17 MED ORDER — TADALAFIL 20 MG PO TABS
20.0000 mg | ORAL_TABLET | Freq: Every day | ORAL | 0 refills | Status: DC | PRN
Start: 2020-07-17 — End: 2020-08-02

## 2020-07-17 MED ORDER — ALFUZOSIN HCL ER 10 MG PO TB24: 10.0000 mg | ORAL_TABLET | Freq: Every day | ORAL | 3 refills | Status: DC

## 2020-07-17 NOTE — Progress Notes (Signed)
07/17/2020 8:51 AM   Alan Lopez 03/14/1946 [id number]  Referring provider: Janith Lima, MD [place],  [place] [address]  Elevated PSa and Nocturia  HPI: Mr Alan Lopez is a 74yo here for followup for elevated PSA. PSA is 9.4 on finasteride. No change is his LUTS, stream good. Nocturia 2x. He is currently on uroxatral and finasteride. No dysuria or hematuria.   His records from [facility] are as follows: Elevated PSA-Established Patient  HPI: Alan Lopez is a 74 year-old male established patient who is here for follow up for further evaluation of his elevated PSA.  His last PSA was performed 07/03/2016. The last PSA value was 8.2. The patient states he does take 5 alpha reductase inhibitor medication.   The patient complains of lower urinary tract symptom(s) that include urgency, weak stream, and nocturia. Patient does have a family history of prostate cancer. Family members with diagnosed prostate cancer include his brother. He has had a prostate biopsy done.   Mr Alan Lopez has a history of elevated PSA with 4-5 negative biopsies in [place]. His PSA was 19.10 in February 2013, 9.92 in September 2014, 8.36 in March, 9.35 in September 2015, 10.81 through his PCP in March and is now 9.71. MRI prostate was unremarkable. He is on finasteride. Corrected PSA is 12.4 which is down from his last corrected PSA of 19.42 in July 2017. In February 2018, PSA was 6.36 (corrected PSA is 12.72). PSA rose to 8.21 (corrected 16.42) in September 2018.   09/04/2018: His PSA was 8.2 last visit. no recent PSA. His brother died from prostate cancer.   2019/03/25: PSA increased to 7.87 from 6.6. no new LUTS   09/30/2019: PSA stable at 7.4     CC: BPH  HPI: His symptoms have been stable over the last year. The patient complains of lower urinary tract symptom(s) that include urgency, weak stream, and nocturia. The patient states his most bothersome symptom(s) are the following:  nocturia. Patient is currently treated with Finasteride for his symptoms. The patient states if he were to spend the rest of his life with his current urinary condition, he would be mostly satisfied. He denies any other associated symptoms.   currently on finasteride   09/04/2018: He has stable LUTS on finasteride. He has previosly tried flomax which failed to improve his LUTS. He drinks 8-10oz prior to bedtime and he stopped this which failed to improve his nocturia.   2019/03/25: He is on uroaxatral 47m qhs and finasteride 522m He continue to have nocturia 3x.   09/30/2019: He is on finasteride 72m23mHe has stable nocturia 2-3x.       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   HTN (hypertension)    Hypertrophy (benign) of prostate    Multinodular goiter 04/12/2015   Non-insulin dependent type 2 diabetes mellitus ([facility]  Status post non-ST elevation myocardial infarction (NSTEMI)    CATH   Tobacco abuse    Type II diabetes mellitus, uncontrolled ([facility]/03/2015    Surgical History: Past Surgical History:  Procedure Laterality Date   CORONARY ANGIOPLASTY WITH STENT PLACEMENT      Home Medications:  Allergies as of 07/17/2020      Reactions   Atorvastatin Other (See Comments)   Codeine Other (See Comments)   Patient doesn't like how it makes him feel, "spaced out, not together"      Medication List       Accurate  as of July 17, 2020  8:51 AM. If you have any questions, ask your nurse or doctor.        alfuzosin 10 MG 24 hr tablet Commonly known as: UROXATRAL Take 10 mg by mouth daily with breakfast.   aspirin EC 81 MG tablet Take 81 mg by mouth daily.   cholecalciferol 1000 units tablet Commonly known as: VITAMIN D Take 1,000 Units by mouth daily.   Contour Next Test test strip Generic drug: glucose blood USE TO TEST BLOOD SUGAR 2-3 TIMES DAILY   dapagliflozin propanediol 10 MG Tabs tablet Commonly known as:  Farxiga Take 1 tablet (10 mg total) by mouth daily.   finasteride 5 MG tablet Commonly known as: PROSCAR Take 5 mg by mouth daily.   fluocinonide-emollient 0.05 % cream Commonly known as: LIDEX-E Apply 1 application topically 2 (two) times daily.   fluticasone 50 MCG/ACT nasal spray Commonly known as: FLONASE Place 2 sprays into both nostrils daily as needed for allergies (for seasonal allergies). Reported on 01/29/2016   freestyle lancets Use to test blood sugar once daily Dx code E11.8   Insulin Pen Needle 32G X 4 MM Misc Use Three per day to inject insulin   insulin regular 100 units/mL injection Commonly known as: NOVOLIN R Inject into the skin 3 (three) times daily before meals. USE INSULIN TO FILL UP v-GO PUMP   lipase/protease/amylase 36000 UNITS Cpep capsule Commonly known as: Creon Take 1 capsule (36,000 Units total) by mouth 3 (three) times daily before meals.   losartan 25 MG tablet Commonly known as: COZAAR TAKE ONE HALF TABLET BY MOUTH DAILY   meloxicam 7.5 MG tablet Commonly known as: MOBIC TAKE ONE TABLET BY MOUTH DAILY   pioglitazone 15 MG tablet Commonly known as: ACTOS TAKE ONE TABLET BY MOUTH DAILY   rosuvastatin 10 MG tablet Commonly known as: Crestor Take 1 tablet (10 mg total) by mouth at bedtime.   V-Go 30 Kit APPLY AS INSTRUCTED       Allergies:  Allergies  Allergen Reactions   Atorvastatin Other (See Comments)   Codeine Other (See Comments)    Patient doesn't like how it makes him feel, "spaced out, not together"    Family History: Family History  Problem Relation Age of Onset   Diabetes Mother    Diabetes Brother    Cancer Brother    Heart disease Neg Hx     Social History:  reports that he has been smoking cigarettes. He has never used smokeless tobacco. He reports current alcohol use of about 10.0 standard drinks of alcohol per week. He reports that he does not use drugs.  ROS: All other review of systems were  reviewed and are negative except what is noted above in HPI  Physical Exam: BP 108/66    Pulse 89    Temp 98.8 F (37.1 C)    Ht 6' (1.829 m)    Wt 207 lb (93.9 kg)    BMI 28.07 kg/m   Constitutional:  Alert and oriented, No acute distress. HEENT:  AT, moist mucus membranes.  Trachea midline, no masses. Cardiovascular: No clubbing, cyanosis, or edema. Respiratory: Normal respiratory effort, no increased work of breathing. GI: Abdomen is soft, nontender, nondistended, no abdominal masses GU: No CVA tenderness. .  Lymph: No cervical or inguinal lymphadenopathy. Skin: No rashes, bruises or suspicious lesions. Neurologic: Grossly intact, no focal deficits, moving all 4 extremities. Psychiatric: Normal mood and affect.  Laboratory Data: Lab Results  Component Value Date  WBC 8.3 04/13/2019   HGB 14.9 04/13/2019   HCT 42.8 04/13/2019   MCV 94.9 04/13/2019   PLT 181.0 04/13/2019    Lab Results  Component Value Date   CREATININE 1.03 05/01/2020    No results found for: PSA  No results found for: TESTOSTERONE  Lab Results  Component Value Date   HGBA1C 7.4 (H) 03/21/2020    Urinalysis    Component Value Date/Time   COLORURINE YELLOW 01/29/2017 1024   APPEARANCEUR CLEAR 01/29/2017 1024   LABSPEC 1.020 01/29/2017 1024   PHURINE 6.0 01/29/2017 1024   GLUCOSEU NEGATIVE 01/29/2017 1024   HGBUR NEGATIVE 01/29/2017 1024   BILIRUBINUR NEGATIVE 01/29/2017 1024   KETONESUR NEGATIVE 01/29/2017 1024   UROBILINOGEN 0.2 01/29/2017 1024   NITRITE NEGATIVE 01/29/2017 1024   LEUKOCYTESUR NEGATIVE 01/29/2017 1024    No results found for: LABMICR, [place], RBCUA, LABEPIT, MUCUS, BACTERIA  Pertinent Imaging:  No results found for this or any previous visit.  No results found for this or any previous visit.  No results found for this or any previous visit.  No results found for this or any previous visit.  No results found for this or any previous visit.  No results found  for this or any previous visit.  No results found for this or any previous visit.  No results found for this or any previous visit.   Assessment & Plan:    1. Elevated PSA -Recheck PSA today. If it remains elevated we will proceed with prostate biopsy  - Urinalysis, Routine w reflex microscopic  2. Benign prostatic hyperplasia with urinary obstruction -continue uroxatral 89m and finasteride  3. Nocturia -continue uroxatral 139mand finasteride   No follow-ups on file.  PaNicolette BangMD  [facility]ary[facility] Re[place]

## 2020-07-17 NOTE — Progress Notes (Signed)
Urological Symptom Review  Patient is experiencing the following symptoms: Frequent urination Hard to postpone urination Get up at night to urinate Leakage of urine Erection problems (male only)   Review of Systems  Gastrointestinal (upper)  : Negative for upper GI symptoms  Gastrointestinal (lower) : Diarrhea  Constitutional : Negative for symptoms  Skin: Negative for skin symptoms  Eyes: Negative for eye symptoms  Ear/Nose/Throat : Negative for Ear/Nose/Throat symptoms  Hematologic/Lymphatic: Negative for Hematologic/Lymphatic symptoms  Cardiovascular : Negative for cardiovascular symptoms  Respiratory : Negative for respiratory symptoms  Endocrine: Negative for endocrine symptoms  Musculoskeletal: Negative for musculoskeletal symptoms  Neurological: Negative for neurological symptoms  Psychologic: Negative for psychiatric symptoms

## 2020-07-17 NOTE — Addendum Note (Signed)
Addended by: Pollyann Kennedy F on: 07/17/2020 09:04 AM   Modules accepted: Orders

## 2020-07-17 NOTE — Patient Instructions (Signed)
Prostate Cancer Screening  Prostate cancer screening is a test that is done to check for the presence of prostate cancer in men. The prostate gland is a walnut-sized gland that is located below the bladder and in front of the rectum in males. The function of the prostate is to add fluid to semen during ejaculation. Prostate cancer is the second most common type of cancer in men. Who should have prostate cancer screening?  Screening recommendations vary based on age and other risk factors. Screening is recommended if:  You are older than age 55. If you are age 55-69, talk with your health care provider about your need for screening and how often screening should be done. Because most prostate cancers are slow growing and will not cause death, screening is generally reserved in this age group for men who have a 10-15-year life expectancy.  You are younger than age 55, and you have these risk factors: ? Being a black male or a male of African descent. ? Having a father, brother, or uncle who has been diagnosed with prostate cancer. The risk is higher if your family member's cancer occurred at an early age. Screening is not recommended if:  You are younger than age 40.  You are between the ages of 40 and 54 and you have no risk factors.  You are 70 years of age or older. At this age, the risks that screening can cause are greater than the benefits that it may provide. If you are at high risk for prostate cancer, your health care provider may recommend that you have screenings more often or that you start screening at a younger age. How is screening for prostate cancer done? The recommended prostate cancer screening test is a blood test called the prostate-specific antigen (PSA) test. PSA is a protein that is made in the prostate. As you age, your prostate naturally produces more PSA. Abnormally high PSA levels may be caused by:  Prostate cancer.  An enlarged prostate that is not caused by cancer  (benign prostatic hyperplasia, BPH). This condition is very common in older men.  A prostate gland infection (prostatitis). Depending on the PSA results, you may need more tests, such as:  A physical exam to check the size of your prostate gland.  Blood and imaging tests.  A procedure to remove tissue samples from your prostate gland for testing (biopsy). What are the benefits of prostate cancer screening?  Screening can help to identify cancer at an early stage, before symptoms start and when the cancer can be treated more easily.  There is a small chance that screening may lower your risk of dying from prostate cancer. The chance is small because prostate cancer is a slow-growing cancer, and most men with prostate cancer die from a different cause. What are the risks of prostate cancer screening? The main risk of prostate cancer screening is diagnosing and treating prostate cancer that would never have caused any symptoms or problems. This is called overdiagnosisand overtreatment. PSA screening cannot tell you if your PSA is high due to cancer or a different cause. A prostate biopsy is the only procedure to diagnose prostate cancer. Even the results of a biopsy may not tell you if your cancer needs to be treated. Slow-growing prostate cancer may not need any treatment other than monitoring, so diagnosing and treating it may cause unnecessary stress or other side effects. A prostate biopsy may also cause:  Infection or fever.  A false negative. This is   a result that shows that you do not have prostate cancer when you actually do have prostate cancer. Questions to ask your health care provider  When should I start prostate cancer screening?  What is my risk for prostate cancer?  How often do I need screening?  What type of screening tests do I need?  How do I get my test results?  What do my results mean?  Do I need treatment? Where to find more information  The American Cancer  Society: www.cancer.org  American Urological Association: www.auanet.org Contact a health care provider if:  You have difficulty urinating.  You have pain when you urinate or ejaculate.  You have blood in your urine or semen.  You have pain in your back or in the area of your prostate. Summary  Prostate cancer is a common type of cancer in men. The prostate gland is located below the bladder and in front of the rectum. This gland adds fluid to semen during ejaculation.  Prostate cancer screening may identify cancer at an early stage, when the cancer can be treated more easily.  The prostate-specific antigen (PSA) test is the recommended screening test for prostate cancer.  Discuss the risks and benefits of prostate cancer screening with your health care provider. If you are age 70 or older, the risks that screening can cause are greater than the benefits that it may provide. This information is not intended to replace advice given to you by your health care provider. Make sure you discuss any questions you have with your health care provider. Document Revised: 07/08/2019 Document Reviewed: 07/08/2019 Elsevier Patient Education  2020 Elsevier Inc.  

## 2020-07-18 LAB — PSA: Prostate Specific Ag, Serum: 10.8 ng/mL — ABNORMAL HIGH (ref 0.0–4.0)

## 2020-07-19 ENCOUNTER — Telehealth: Payer: Self-pay

## 2020-07-19 ENCOUNTER — Other Ambulatory Visit: Payer: Self-pay

## 2020-07-19 DIAGNOSIS — R972 Elevated prostate specific antigen [PSA]: Secondary | ICD-10-CM

## 2020-07-19 MED ORDER — LEVOFLOXACIN 750 MG PO TABS: 750.0000 mg | ORAL_TABLET | Freq: Every day | ORAL | 0 refills | Status: DC

## 2020-07-19 NOTE — Telephone Encounter (Signed)
-----   Message from Cleon Gustin, MD sent at 07/18/2020  9:03 AM EDT ----- PSA increasing. Patient needs to be scheduled for prostate biopsy ----- Message ----- From: Dorisann Frames, RN Sent: 07/18/2020   8:56 AM EDT To: Cleon Gustin, MD  Please review

## 2020-07-19 NOTE — Telephone Encounter (Signed)
Prostate biopsy instructions reviewed with pt and appt created as well. Instructions mailed. abx ordered as well. Pt voiced understanding.

## 2020-07-24 ENCOUNTER — Telehealth: Payer: Self-pay | Admitting: Endocrinology

## 2020-07-24 ENCOUNTER — Other Ambulatory Visit: Payer: Self-pay | Admitting: *Deleted

## 2020-07-24 MED ORDER — V-GO 30 KIT: PACK | 2 refills | Status: DC

## 2020-07-24 NOTE — Telephone Encounter (Signed)
Medication Refill Request  Did you call your pharmacy and request this refill first? Yes  . If patient has not contacted pharmacy first, instruct them to do so for future refills.  . Remind them that contacting the pharmacy for their refill is the quickest method to get the refill.  . Refill policy also stated that it will take anywhere between 24-72 hours to receive the refill.    Name of medication? V-go  Is this a 90 day supply?   Name and location of pharmacy?  [facility] [address] [place], [place] Phone:  [phone]  Fax:  [phone]

## 2020-08-02 ENCOUNTER — Ambulatory Visit (INDEPENDENT_AMBULATORY_CARE_PROVIDER_SITE_OTHER): Payer: Medicare Other | Admitting: Urology

## 2020-08-02 ENCOUNTER — Encounter: Payer: Self-pay | Admitting: Urology

## 2020-08-02 ENCOUNTER — Other Ambulatory Visit: Payer: Self-pay

## 2020-08-02 ENCOUNTER — Other Ambulatory Visit: Payer: Self-pay | Admitting: Endocrinology

## 2020-08-02 ENCOUNTER — Other Ambulatory Visit: Payer: Self-pay | Admitting: Urology

## 2020-08-02 ENCOUNTER — Ambulatory Visit (HOSPITAL_COMMUNITY)
Admission: RE | Admit: 2020-08-02 | Discharge: 2020-08-02 | Disposition: A | Discharge status: Home / Self Care | Payer: Medicare Other | Source: Ambulatory Visit | Attending: Urology | Admitting: Urology

## 2020-08-02 DIAGNOSIS — R972 Elevated prostate specific antigen [PSA]: Secondary | ICD-10-CM

## 2020-08-02 DIAGNOSIS — D291 Benign neoplasm of prostate: Secondary | ICD-10-CM | POA: Insufficient documentation

## 2020-08-02 MED ORDER — LIDOCAINE HCL (PF) 2 % IJ SOLN
INTRAMUSCULAR | Status: AC
  Filled 2020-08-02: qty 10

## 2020-08-02 MED ORDER — GENTAMICIN SULFATE 40 MG/ML IJ SOLN
INTRAMUSCULAR | Status: AC
  Administered 2020-08-02: 80 mg via INTRAMUSCULAR
  Filled 2020-08-02: qty 2

## 2020-08-02 MED ORDER — GENTAMICIN SULFATE 40 MG/ML IJ SOLN: 80.0000 mg | Freq: Once | INTRAMUSCULAR | Status: AC

## 2020-08-02 MED ORDER — TADALAFIL 20 MG PO TABS: 20.0000 mg | ORAL_TABLET | Freq: Every day | ORAL | 0 refills | Status: DC | PRN

## 2020-08-02 NOTE — Progress Notes (Signed)
Prostate Biopsy Procedure   Informed consent was obtained after discussing risks/benefits of the procedure.  A time out was performed to ensure correct patient identity.  Pre-Procedure: - Last PSA Level: No results found for: PSA - Gentamicin given prophylactically - Levaquin 500 mg administered PO -Transrectal Ultrasound performed revealing a 34.87 gm prostate -No significant hypoechoic or median lobe noted  Procedure: - Prostate block performed using 10 cc 1% lidocaine and biopsies taken from sextant areas, a total of 12 under ultrasound guidance.  Post-Procedure: - Patient tolerated the procedure well - He was counseled to seek immediate medical attention if experiences any severe pain, significant bleeding, or fevers - Return in one week to discuss biopsy results

## 2020-08-02 NOTE — Patient Instructions (Signed)

## 2020-08-08 ENCOUNTER — Other Ambulatory Visit: Payer: Self-pay

## 2020-08-08 ENCOUNTER — Other Ambulatory Visit (INDEPENDENT_AMBULATORY_CARE_PROVIDER_SITE_OTHER): Payer: Medicare Other

## 2020-08-08 DIAGNOSIS — E1165 Type 2 diabetes mellitus with hyperglycemia: Secondary | ICD-10-CM

## 2020-08-08 DIAGNOSIS — Z794 Long term (current) use of insulin: Secondary | ICD-10-CM | POA: Diagnosis not present

## 2020-08-08 LAB — COMPREHENSIVE METABOLIC PANEL
ALT: 41 U/L (ref 0–53)
AST: 29 U/L (ref 0–37)
Albumin: 4.1 g/dL (ref 3.5–5.2)
Alkaline Phosphatase: 60 U/L (ref 39–117)
BUN: 16 mg/dL (ref 6–23)
CO2: 22 mEq/L (ref 19–32)
Calcium: 9.1 mg/dL (ref 8.4–10.5)
Chloride: 108 mEq/L (ref 96–112)
Creatinine, Ser: 0.9 mg/dL (ref 0.40–1.50)
GFR: 82.15 mL/min (ref >60.00)
Glucose, Bld: 115 mg/dL — ABNORMAL HIGH (ref 70–99)
Potassium: 4 mEq/L (ref 3.5–5.1)
Sodium: 138 mEq/L (ref 135–145)
Total Bilirubin: 0.7 mg/dL (ref 0.2–1.2)
Total Protein: 6.7 g/dL (ref 6.0–8.3)

## 2020-08-08 LAB — HEMOGLOBIN A1C: Hgb A1c MFr Bld: 7.2 % — ABNORMAL HIGH (ref 4.6–6.5)

## 2020-08-09 ENCOUNTER — Telehealth: Payer: Self-pay

## 2020-08-09 NOTE — Telephone Encounter (Signed)
-----   Message from Cleon Gustin, MD sent at 08/09/2020  2:26 PM EDT ----- Negative. Have him see me in 6 months with a PSA ----- Message ----- From: Dorisann Frames, RN Sent: 08/04/2020  10:51 AM EDT To: Cleon Gustin, MD  biopsy

## 2020-08-09 NOTE — Telephone Encounter (Signed)
Pt notified of results- appt of psa and lab work notified to pt.

## 2020-08-10 ENCOUNTER — Ambulatory Visit (INDEPENDENT_AMBULATORY_CARE_PROVIDER_SITE_OTHER): Payer: Medicare Other | Admitting: Endocrinology

## 2020-08-10 ENCOUNTER — Other Ambulatory Visit: Payer: Self-pay

## 2020-08-10 ENCOUNTER — Encounter: Payer: Self-pay | Admitting: Endocrinology

## 2020-08-10 VITALS — BP 118/62 | HR 73 | Ht 72.0 in | Wt 209.6 lb

## 2020-08-10 DIAGNOSIS — E042 Nontoxic multinodular goiter: Secondary | ICD-10-CM | POA: Diagnosis not present

## 2020-08-10 DIAGNOSIS — I10 Essential (primary) hypertension: Secondary | ICD-10-CM | POA: Diagnosis not present

## 2020-08-10 DIAGNOSIS — E1142 Type 2 diabetes mellitus with diabetic polyneuropathy: Secondary | ICD-10-CM

## 2020-08-10 NOTE — Progress Notes (Signed)
Patient ID: Alan Lopez, male   DOB: 06-Jul-1946, 74 y.o.   MRN: [id number]            Reason for Appointment: Follow-up for Type 2 Diabetes   History of Present Illness:          Date of diagnosis of type 2 diabetes mellitus :  1999      Past history:  He had modestly increase blood sugars at diagnosis and was started on metformin Subsequently glipizide was added to improve his control. He thinks that his blood sugars have been mostly poorly controlled over the last several years He had been on glipizide and metformin for years without any adjustment of his dosage of glipizide  Review of his A1c shows that it had ranged from 9-9.5 since 07/2013 Previously his PCP tried him on a sample of Jardiance 10 mg without significant improvement  He was started on the V-go pump on 07/02/17 because of inconsistent control and poor compliance with mealtime insulin  Recent history:   Oral hypoglycemic drugs the patient is taking are: Actos 15 mg, Farxiga 5 mg daily  Current INSULIN regimen: V-go pump SETTINGS: Basal rate = 30 units BOLUSES = 3-4 units at breakfast, 8 at lunch and  8-10 at dinner  A1c is 7.2 compared to 7.4  Fructosamine previously 225  Current blood sugar patterns and problems identified:  He did not bring his monitor for download  Although he thought this was causing a rash on his forearms he does not think that this is as prominent and also not complaining about reportedly increased appetite from [place]  Although his blood sugars tend to be high after meals not clear how often he is monitoring these  Fasting readings are reportedly fairly consistently near normal now  He is doing a little less physical activity and yard work since he does not like to go out in the heat  Lab fasting glucose was 115  No hypoglycemia, may have had 1 minor episode during the night in the last 3 months  Has been regular with boluses, sometimes up to 30 minutes before eating  He  is changing his pump at the same time in the morning as before  Side effects from medications have been: Mild  diarrhea from regular metformin  Compliance with the medical regimen: fair   Hypoglycemia: never   Glucose monitoring:  done once a day usually       Glucometer:   Contour   Blood sugars by recall: Am 105-120 After meals up to 171  Previous readings:  PRE-MEAL Fasting Lunch Dinner Bedtime Overall  Glucose range:  98-192   ?  97-164   Mean/median:  136    135 136   POST-MEAL PC Breakfast PC Lunch PC Dinner  Glucose range:  122   Mean/median:           Self-care: The diet that the patient has been following is: tries to limit high carbohydrate foods. eating out about 4 times a week     Typical meal intake: Breakfast is  Toast/meat .  Usually 2 servings of vegetables in the evening and avoiding potatoes and bread usually Usually avoiding drinks with sugar                Dietician visit, most recent: 05/2015  CDE visit: 01/2016  Weight history: Maximum weight 215 previously  Wt Readings from Last 3 Encounters:  08/10/20 209 lb 9.6 oz (95.1 kg)  07/17/20 207 lb (93.9  kg)  06/05/20 207 lb (93.9 kg)    Glycemic control:    Lab Results  Component Value Date   HGBA1C 7.2 (H) 08/08/2020   HGBA1C 7.4 (H) 03/21/2020   HGBA1C 7.1 (H) 12/20/2019   Lab Results  Component Value Date   MICROALBUR 1.9 12/20/2019   LDLCALC 38 12/20/2019   CREATININE 0.90 08/08/2020    Lab on 08/08/2020  Component Date Value Ref Range Status  . Sodium 08/08/2020 138  135 - 145 mEq/L Final  . Potassium 08/08/2020 4.0  3.5 - 5.1 mEq/L Final  . Chloride 08/08/2020 108  96 - 112 mEq/L Final  . CO2 08/08/2020 22  19 - 32 mEq/L Final  . Glucose, Bld 08/08/2020 115* 70 - 99 mg/dL Final  . BUN 08/08/2020 16  6 - 23 mg/dL Final  . Creatinine, Ser 08/08/2020 0.90  0.40 - 1.50 mg/dL Final  . Total Bilirubin 08/08/2020 0.7  0.2 - 1.2 mg/dL Final  . Alkaline Phosphatase 08/08/2020 60  39 - 117  U/L Final  . AST 08/08/2020 29  0 - 37 U/L Final  . ALT 08/08/2020 41  0 - 53 U/L Final  . Total Protein 08/08/2020 6.7  6.0 - 8.3 g/dL Final  . Albumin 08/08/2020 4.1  3.5 - 5.2 g/dL Final  . GFR 08/08/2020 82.15  >60.00 mL/min Final  . Calcium 08/08/2020 9.1  8.4 - 10.5 mg/dL Final  . Hgb A1c MFr Bld 08/08/2020 7.2* 4.6 - 6.5 % Final   Glycemic Control Guidelines for People with Diabetes:Non Diabetic:  <6%Goal of Therapy: <7%Additional Action Suggested:  >8%       Allergies as of 08/10/2020      Reactions   Atorvastatin Other (See Comments)   Codeine Other (See Comments)   Patient doesn't like how it makes him feel, "spaced out, not together"      Medication List       Accurate as of August 10, 2020  2:41 PM. If you have any questions, ask your nurse or doctor.        alfuzosin 10 MG 24 hr tablet Commonly known as: UROXATRAL Take 1 tablet (10 mg total) by mouth daily with breakfast.   aspirin EC 81 MG tablet Take 81 mg by mouth daily.   cholecalciferol 1000 units tablet Commonly known as: VITAMIN D Take 1,000 Units by mouth daily.   Contour Next Test test strip Generic drug: glucose blood USE TO TEST BLOOD SUGAR 2-3 TIMES DAILY   dapagliflozin propanediol 10 MG Tabs tablet Commonly known as: Farxiga Take 1 tablet (10 mg total) by mouth daily.   finasteride 5 MG tablet Commonly known as: PROSCAR Take 1 tablet (5 mg total) by mouth daily.   fluocinonide-emollient 0.05 % cream Commonly known as: LIDEX-E Apply 1 application topically 2 (two) times daily.   fluticasone 50 MCG/ACT nasal spray Commonly known as: FLONASE Place 2 sprays into both nostrils daily as needed for allergies (for seasonal allergies). Reported on 01/29/2016   freestyle lancets Use to test blood sugar once daily Dx code E11.8   Insulin Pen Needle 32G X 4 MM Misc Use Three per day to inject insulin   insulin regular 100 units/mL injection Commonly known as: NOVOLIN R Inject into the  skin 3 (three) times daily before meals. USE INSULIN TO FILL UP v-GO PUMP   levofloxacin 750 MG tablet Commonly known as: Levaquin Take 1 tablet (750 mg total) by mouth daily.   lipase/protease/amylase 36000 UNITS Cpep capsule Commonly  known as: Creon Take 1 capsule (36,000 Units total) by mouth 3 (three) times daily before meals.   losartan 25 MG tablet Commonly known as: COZAAR TAKE ONE HALF TABLET BY MOUTH DAILY   meloxicam 7.5 MG tablet Commonly known as: MOBIC TAKE ONE TABLET BY MOUTH DAILY   pioglitazone 15 MG tablet Commonly known as: ACTOS TAKE ONE TABLET BY MOUTH DAILY   rosuvastatin 10 MG tablet Commonly known as: Crestor Take 1 tablet (10 mg total) by mouth at bedtime.   tadalafil 20 MG tablet Commonly known as: CIALIS Take 1 tablet (20 mg total) by mouth daily as needed for erectile dysfunction.   V-Go 30 Kit APPLY AS INSTRUCTED       Allergies:  Allergies  Allergen Reactions  . Atorvastatin Other (See Comments)  . Codeine Other (See Comments)    Patient doesn't like how it makes him feel, "spaced out, not together"    Past Medical History:  Diagnosis Date  . 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 HTN (hypertension)   . Hypertrophy (benign) of prostate   . Multinodular goiter 04/12/2015  . Non-insulin dependent type 2 diabetes mellitus ([place])   . Status post non-ST elevation myocardial infarction (NSTEMI)    CATH  . Tobacco abuse   . Type II diabetes mellitus, uncontrolled ([place]) 04/12/2015    Past Surgical History:  Procedure Laterality Date  . CORONARY ANGIOPLASTY WITH STENT PLACEMENT      Family History  Problem Relation Age of Onset  . Diabetes Mother   . Diabetes Brother   . Cancer Brother   . Heart disease Neg Hx     Social History:  reports that he has been smoking cigarettes. He has never used smokeless tobacco. He reports current alcohol use of about 10.0 standard drinks of alcohol per week. He reports that he does not use drugs.     Review of Systems     Lipid history:   Has history of CAD and myocardial infarction  He is on 10 mg Crestor  LDL is below 70 Labs as follows:   Lab Results  Component Value Date   CHOL 110 12/20/2019   HDL 62.00 12/20/2019   LDLCALC 38 12/20/2019   TRIG 52.0 12/20/2019   CHOLHDL 2 12/20/2019           Eyes: He has had blindness in his left eye from old injury   His last eye exam was in 12/2018  THYROID: Previously was found to have a 2 cm nodule on his left thyroid on his exam but ultrasound in 04/2015 showed multinodular goiter with mostly small nodules and the largest nodule on the left side of 1.4 cm has the appearance of a pseudo- nodule  Thyroid levels have been normal  Lab Results  Component Value Date   TSH 2.14 03/21/2020    NEUROPATHY: Has feeling of numbness on the soles of his feet which is more on the heels now Also feels sensation of numbness when his foot is on the car accelerator pedal He has no numbness in the toes No burning or tingling   LABS:  Lab on 08/08/2020  Component Date Value Ref Range Status  . Sodium 08/08/2020 138  135 - 145 mEq/L Final  . Potassium 08/08/2020 4.0  3.5 - 5.1 mEq/L Final  . Chloride 08/08/2020 108  96 - 112 mEq/L Final  . CO2 08/08/2020 22  19 - 32 mEq/L Final  . Glucose, Bld 08/08/2020 115* 70 - 99 mg/dL  Final  . BUN 08/08/2020 16  6 - 23 mg/dL Final  . Creatinine, Ser 08/08/2020 0.90  0.40 - 1.50 mg/dL Final  . Total Bilirubin 08/08/2020 0.7  0.2 - 1.2 mg/dL Final  . Alkaline Phosphatase 08/08/2020 60  39 - 117 U/L Final  . AST 08/08/2020 29  0 - 37 U/L Final  . ALT 08/08/2020 41  0 - 53 U/L Final  . Total Protein 08/08/2020 6.7  6.0 - 8.3 g/dL Final  . Albumin 08/08/2020 4.1  3.5 - 5.2 g/dL Final  . GFR 08/08/2020 82.15  >60.00 mL/min Final  . Calcium 08/08/2020 9.1  8.4 - 10.5 mg/dL Final  . Hgb A1c MFr Bld 08/08/2020 7.2* 4.6 - 6.5 % Final   Glycemic Control Guidelines for People with Diabetes:Non Diabetic:   <6%Goal of Therapy: <7%Additional Action Suggested:  >8%     Physical Examination:  BP 118/62 (BP Location: Left Arm, Patient Position: Sitting, Cuff Size: Normal)   Pulse 73   Ht 6' (1.829 m)   Wt 209 lb 9.6 oz (95.1 kg)   SpO2 96%   BMI 28.43 kg/m   Diabetic Foot Exam - Simple   Simple Foot Form Diabetic Foot exam was performed with the following findings: Yes   Visual Inspection No deformities, no ulcerations, no other skin breakdown bilaterally: Yes Sensation Testing Intact to touch and monofilament testing bilaterally: Yes Pulse Check Posterior Tibialis and Dorsalis pulse intact bilaterally: Yes See comments: Yes Comments Pedal pulses 2/4 throughout    He has macular scattered slightly raised areas on his forearms about 1 cm in size  ASSESSMENT:  Diabetes type 2, on insulin  See history of present illness for detailed discussion of his current management, blood sugar patterns and problems identified  A1c 7.2 compared to 7.4  Fructosamine previously 225  With continuing Farxiga his blood sugars are overall still fairly good Reassured him that this is important for his treatment especially with his history of CAD and also maintenance of some improvement in his weight.  Discussed that Actos is helpful in insulin resistance   Mild hypertension: Controlled  Rash on forearms: He will discuss with dermatologist  Mild neuropathic symptoms: No objective sensory loss seen  PLAN:   Continue Farxiga 5 mg daily in the morning No change in insulin regimen  Encouraged him to start exercising more regularly with brisk walking  To check readings after meals consistently and let us know if they are consistently high  To bring meter on each visit for download     There are no Patient Instructions on file for this visit.     Elayne Snare 08/10/2020, 2:41 PM   Note: This office note was prepared with Dragon voice recognition system technology. Any transcriptional  errors that result from this process are unintentional.

## 2020-08-25 IMAGING — DX DG SHOULDER 2+V*R*
3 series · 3 of 3 positions shown · non-contrast
Comparison: None.

CLINICAL DATA: Chronic right shoulder pain.

EXAM:
RIGHT SHOULDER - 2+ VIEW

[grashey]
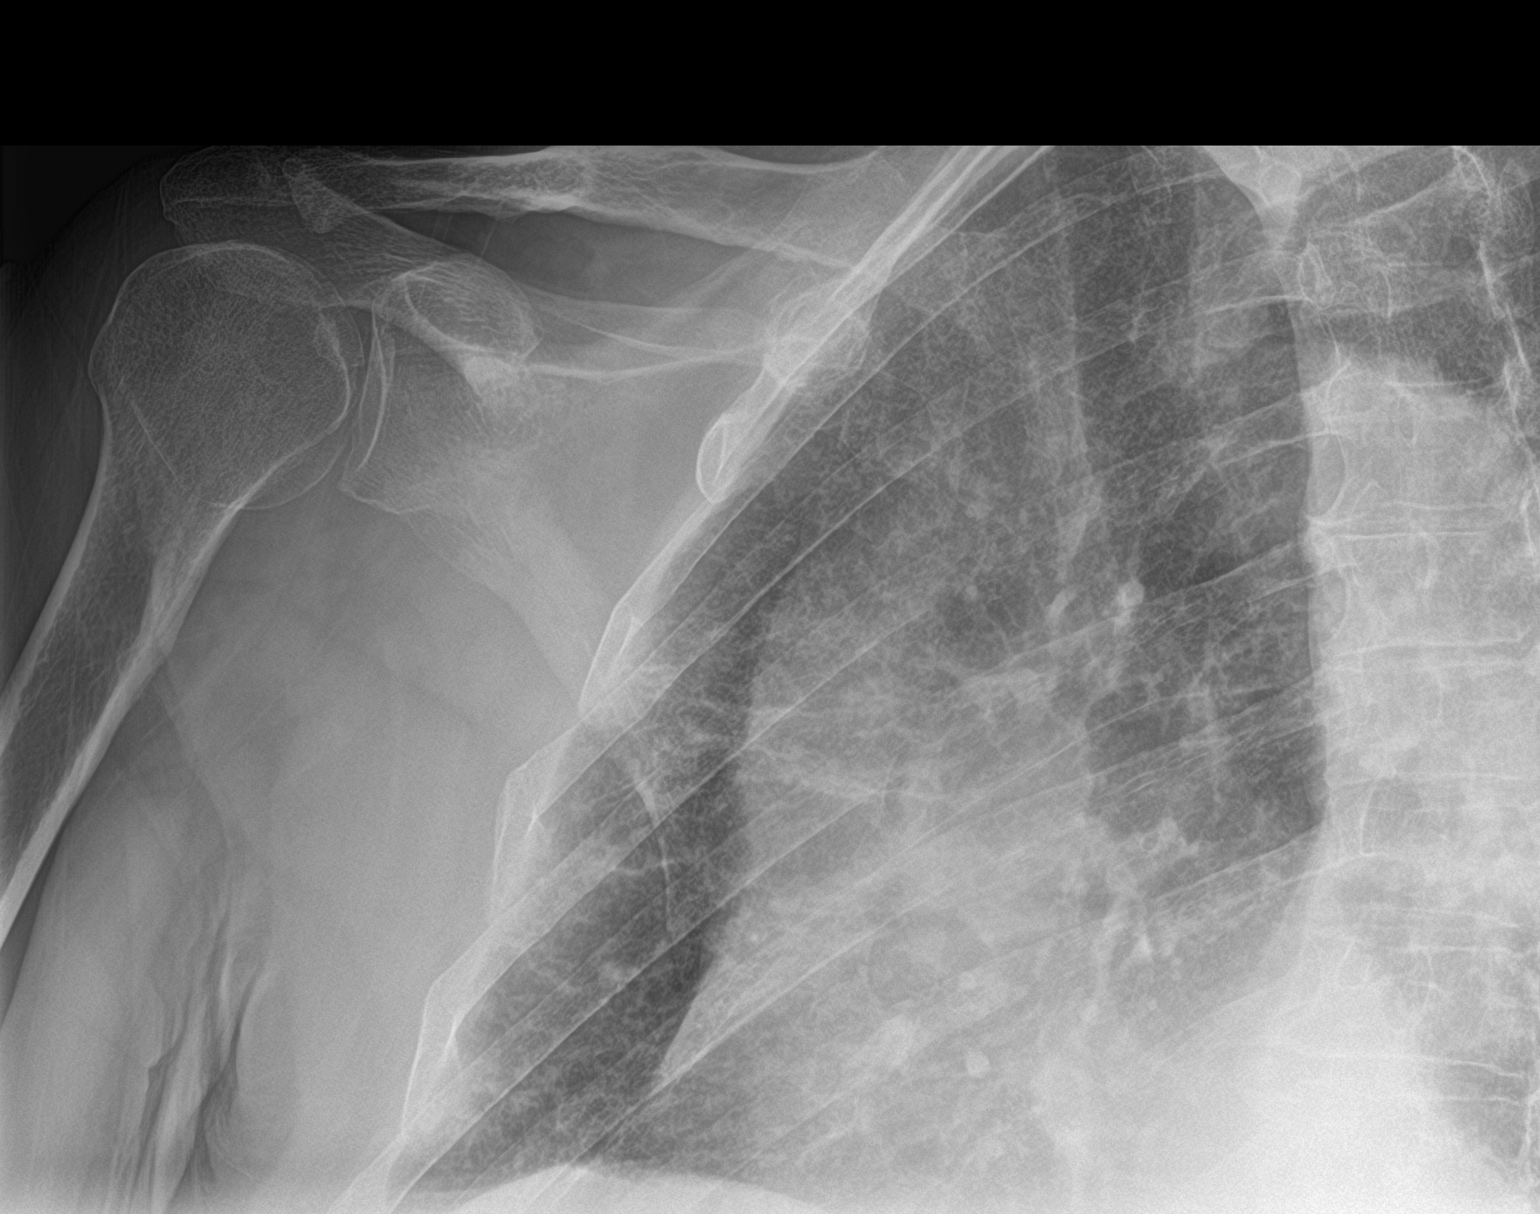

[y view]
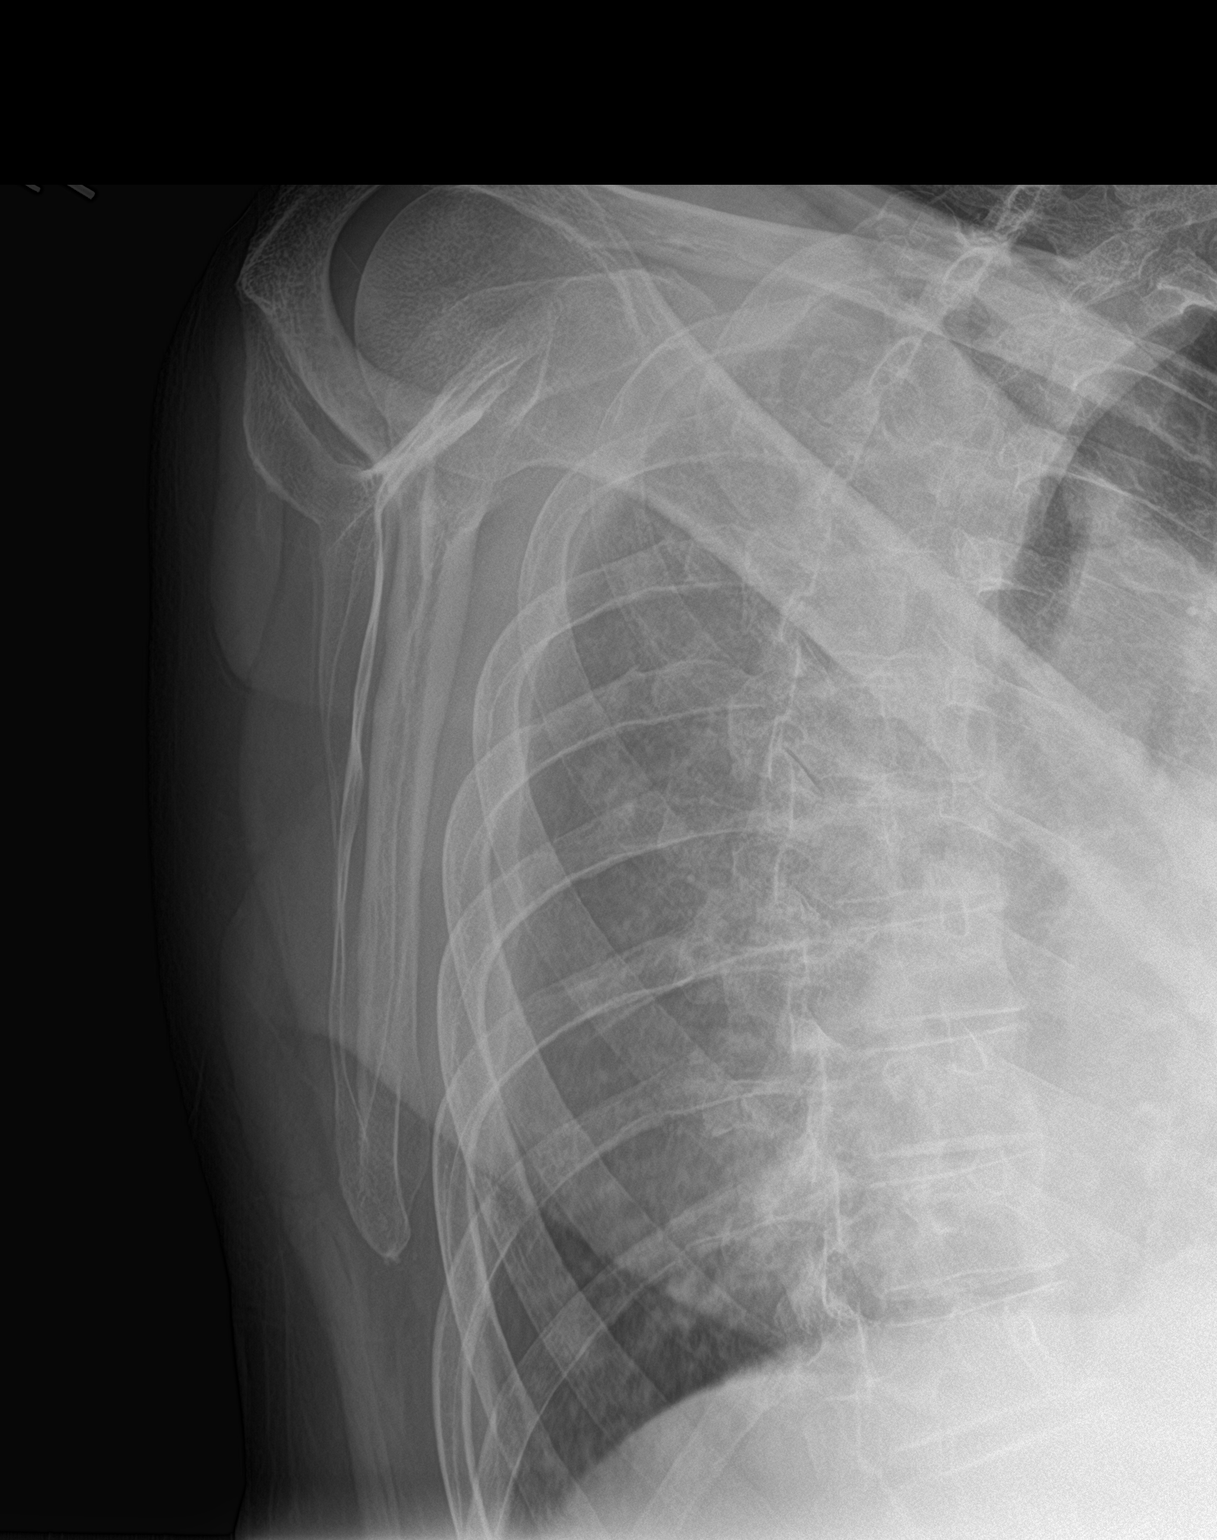

[shoulder axial]
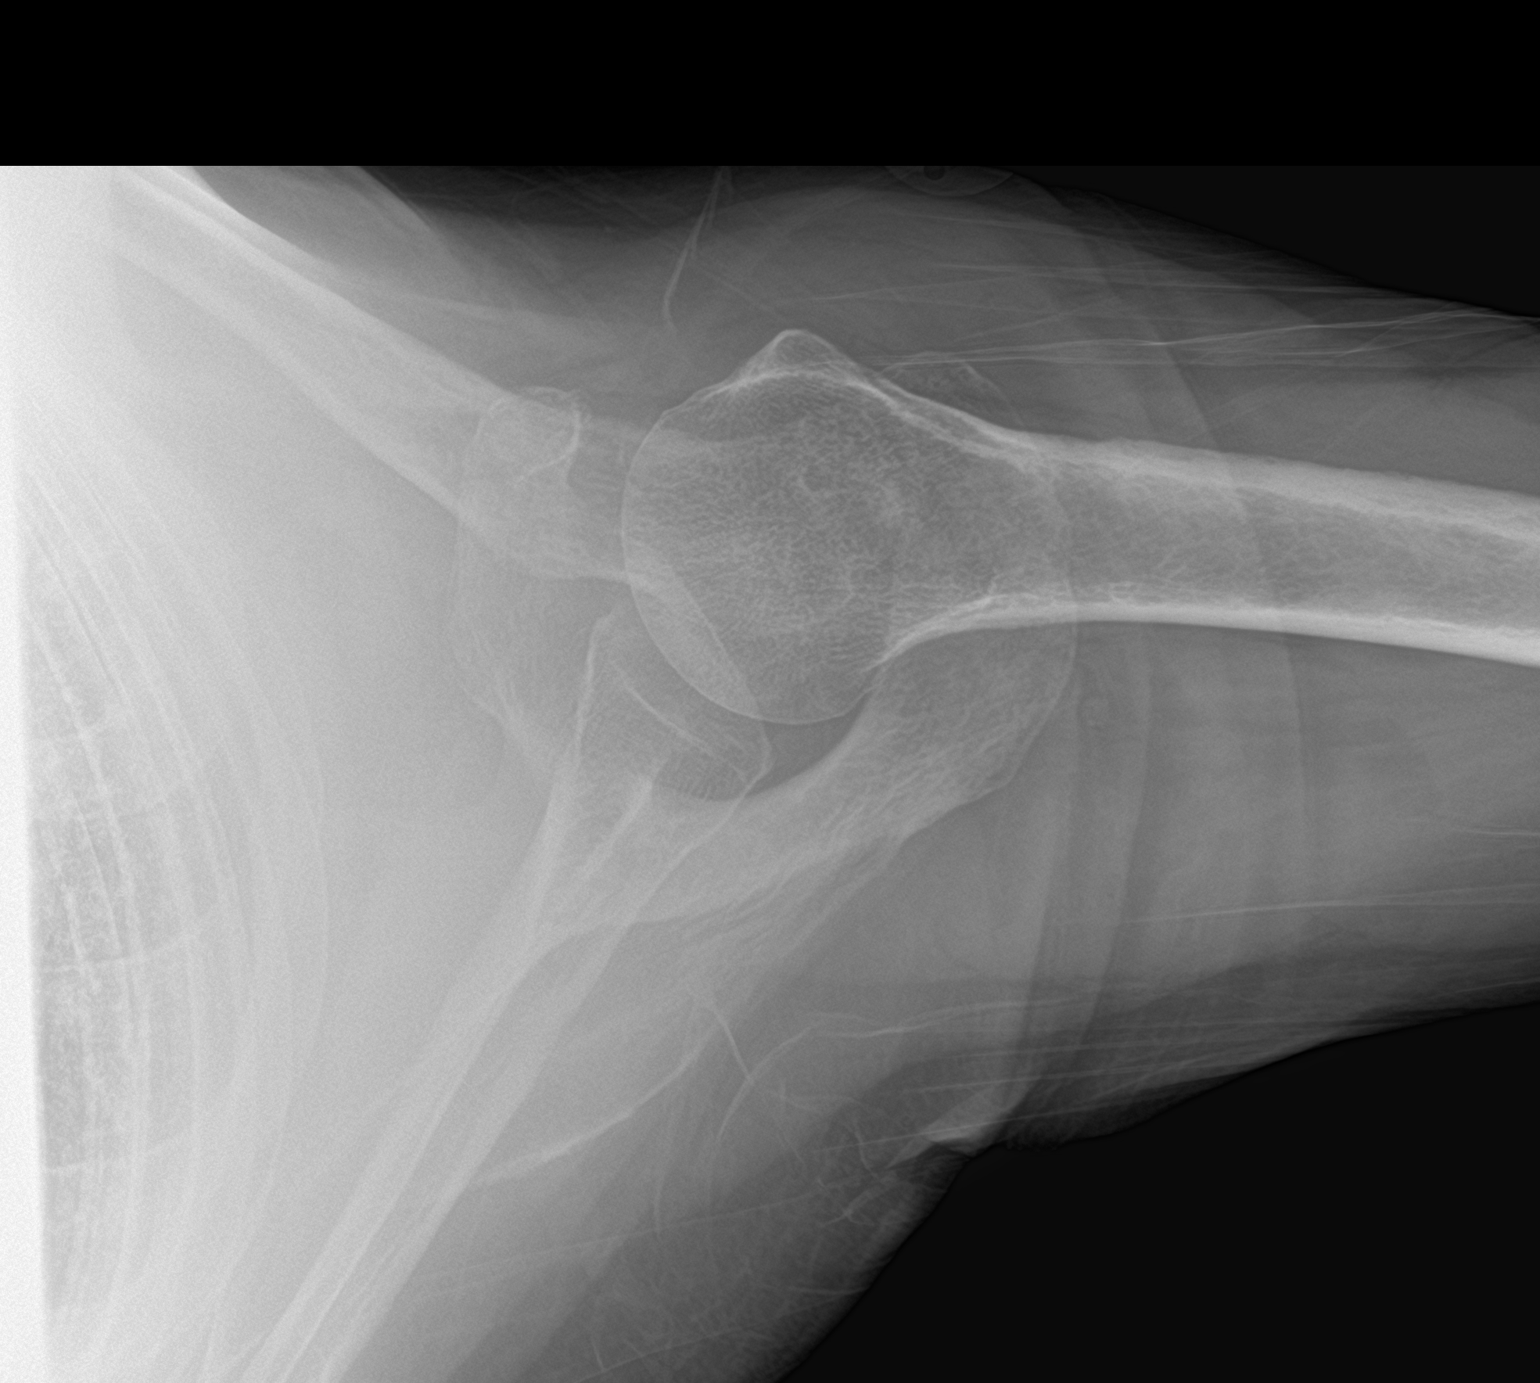

[3 of 3 positions shown; findings below may reference images not displayed]

FINDINGS: No acute fracture or dislocation. Mild acromioclavicular joint space
narrowing. The glenohumeral joint space is relatively preserved.
Osteopenia. Soft tissues are unremarkable.
IMPRESSION: 1. Mild acromioclavicular osteoarthritis.

## 2020-08-25 IMAGING — DX DG HIP (WITH OR WITHOUT PELVIS) 2-3V*R*
3 series · 3 of 3 positions shown · non-contrast
Comparison: None.

CLINICAL DATA: Chronic right hip pain.

EXAM:
DG HIP (WITH OR WITHOUT PELVIS) 2-3V RIGHT

[pelvis ap]
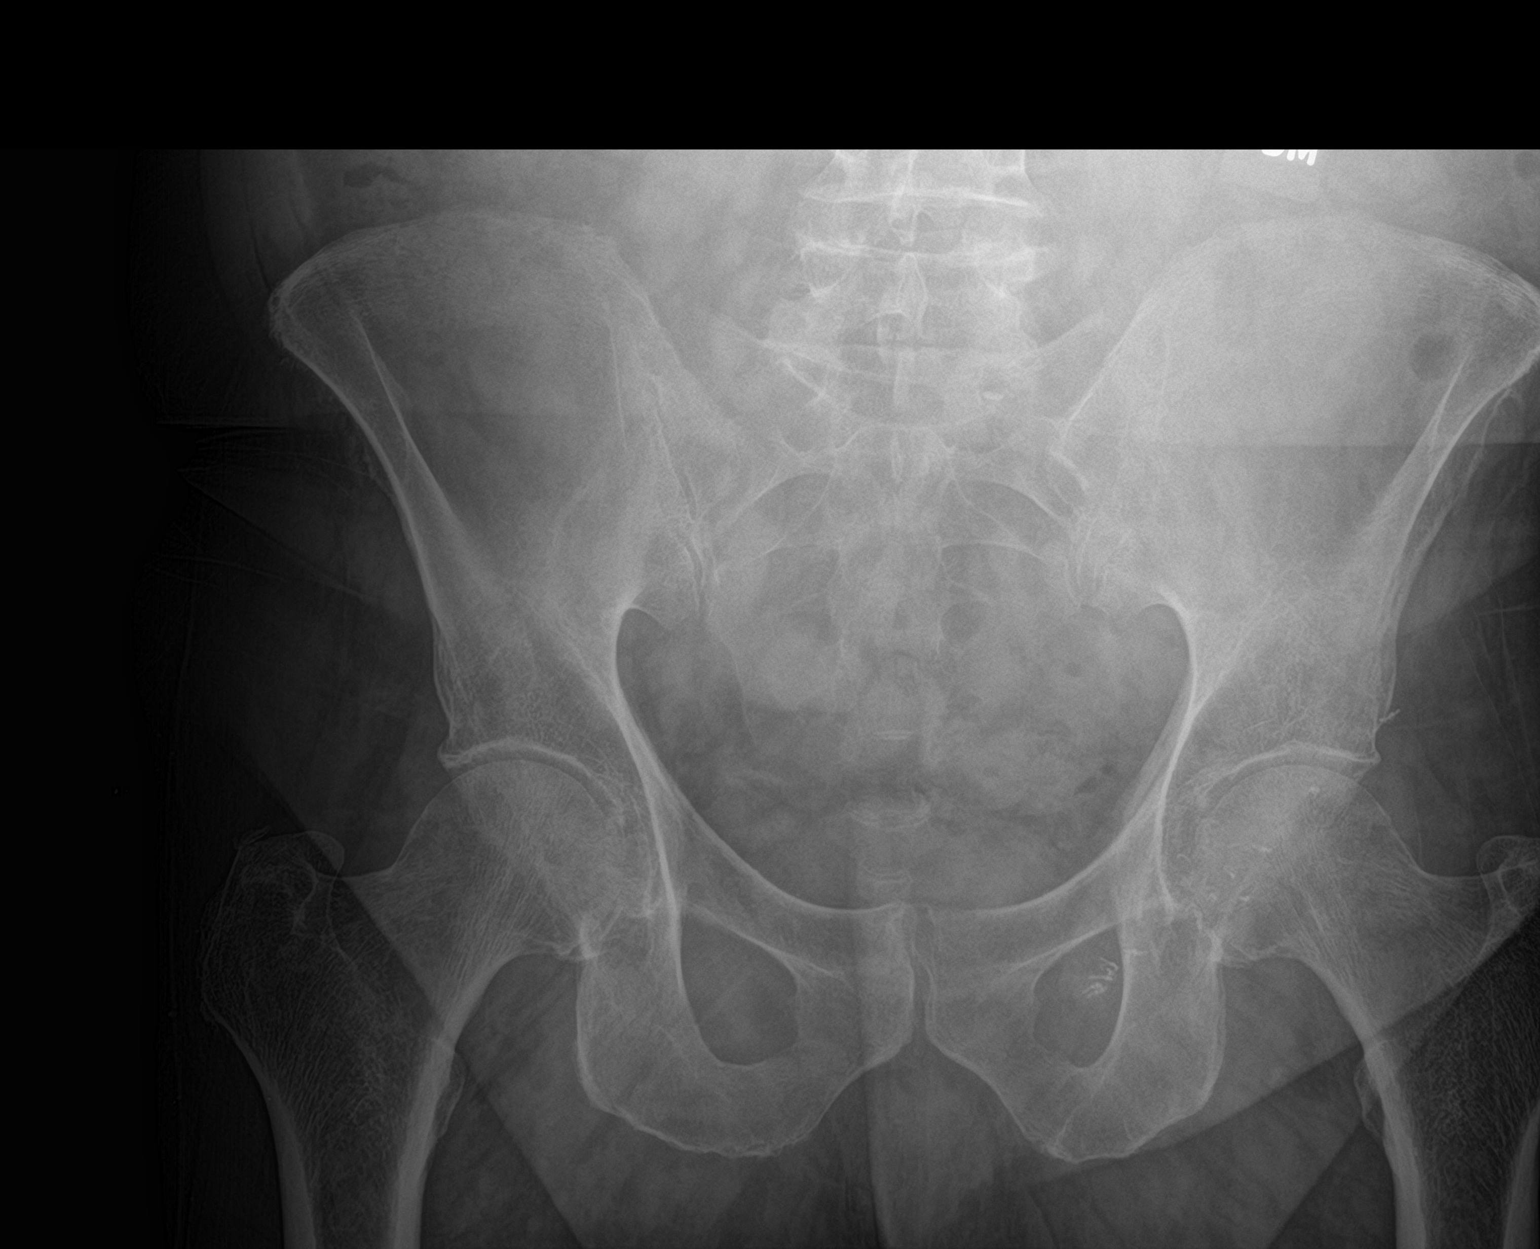

[hip ap]
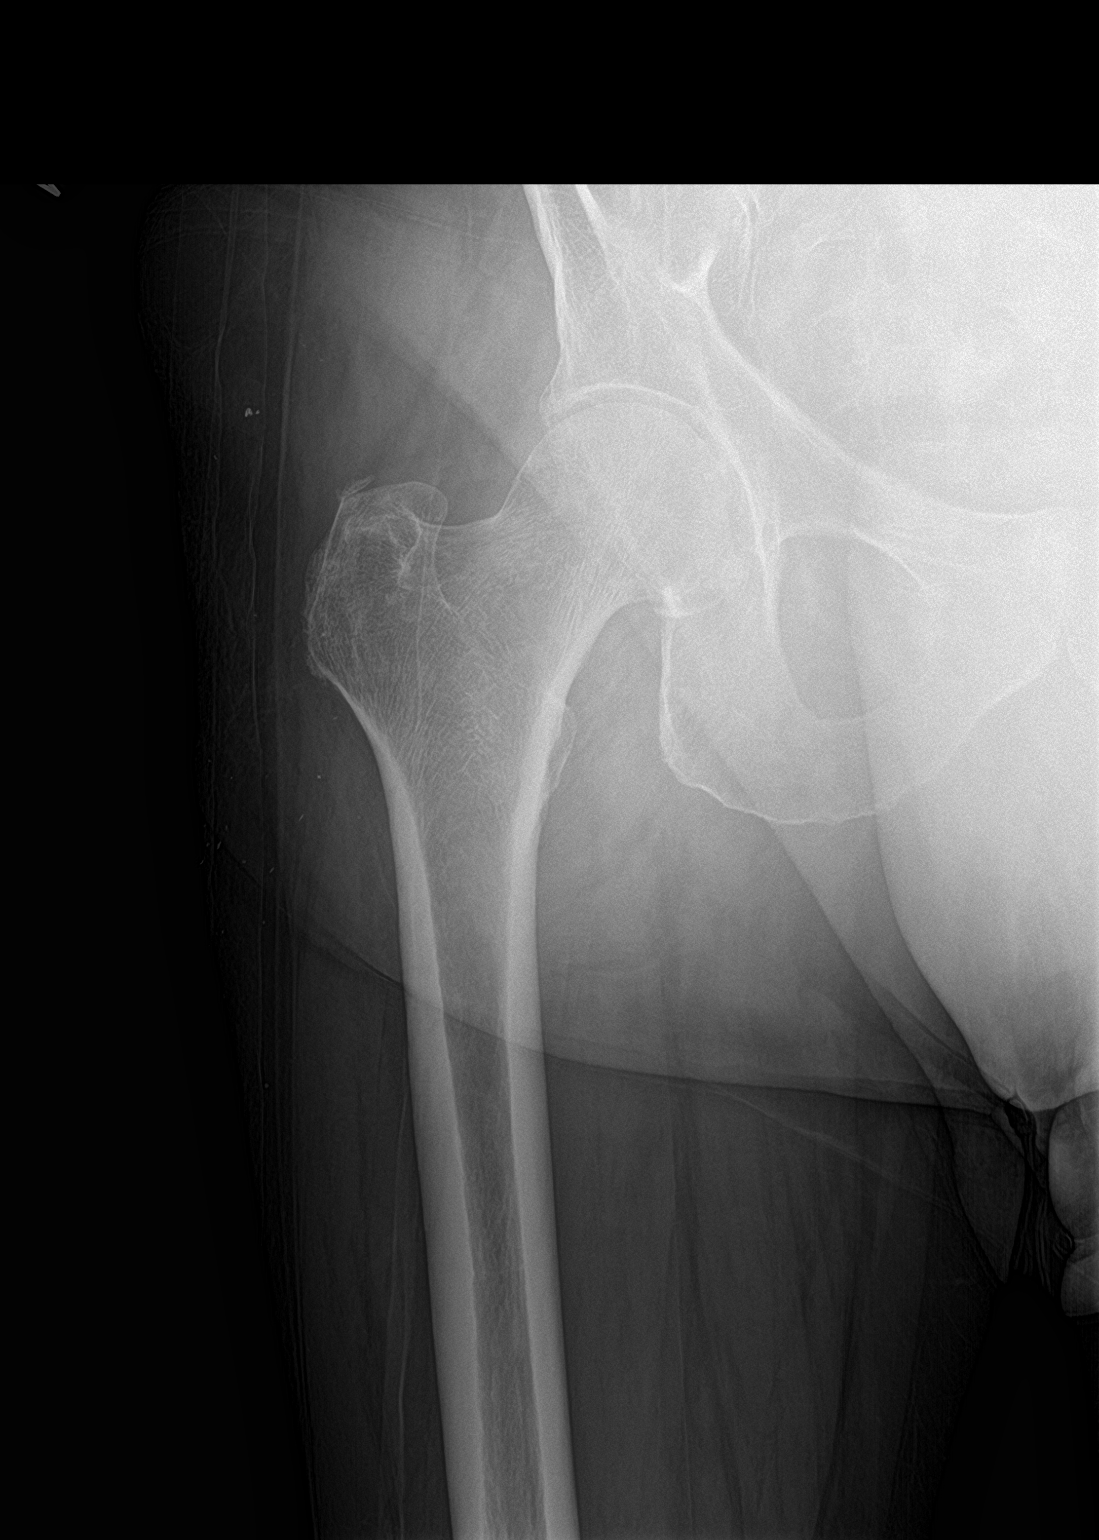

[hip lat]
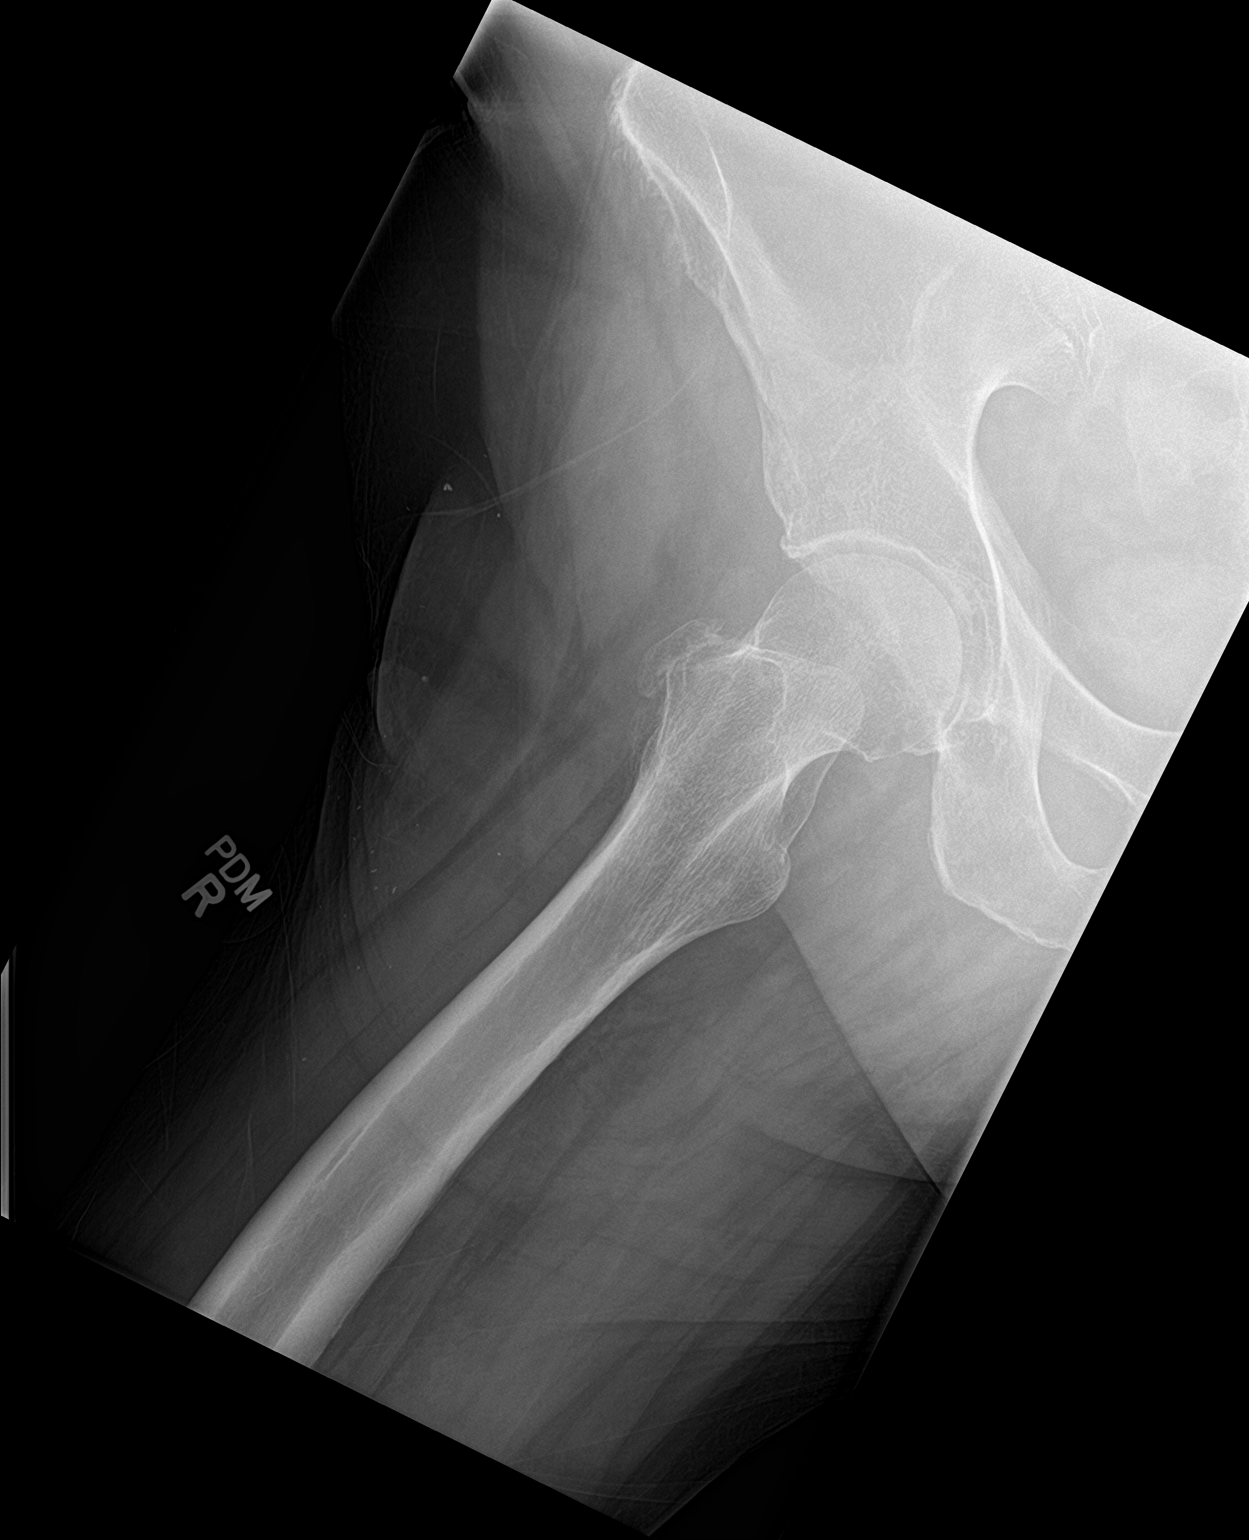

[3 of 3 positions shown; findings below may reference images not displayed]

FINDINGS: There are tiny marginal osteophytes on the femoral head. There are
enthesophytes at the gluteal tendon insertions on the greater
trochanter of the proximal right femur. Pelvic bones appear normal.
Joint spaces preserved.
IMPRESSION: Minimal degenerative changes of the right hip as described. No acute
abnormalities.

## 2020-08-27 ENCOUNTER — Other Ambulatory Visit: Payer: Self-pay | Admitting: Endocrinology

## 2020-09-19 DIAGNOSIS — Z23 Encounter for immunization: Secondary | ICD-10-CM | POA: Diagnosis not present

## 2020-09-21 DIAGNOSIS — D485 Neoplasm of uncertain behavior of skin: Secondary | ICD-10-CM | POA: Diagnosis not present

## 2020-09-21 DIAGNOSIS — L821 Other seborrheic keratosis: Secondary | ICD-10-CM | POA: Diagnosis not present

## 2020-09-21 DIAGNOSIS — L920 Granuloma annulare: Secondary | ICD-10-CM | POA: Diagnosis not present

## 2020-09-21 DIAGNOSIS — D1801 Hemangioma of skin and subcutaneous tissue: Secondary | ICD-10-CM | POA: Diagnosis not present

## 2020-09-21 DIAGNOSIS — L720 Epidermal cyst: Secondary | ICD-10-CM | POA: Diagnosis not present

## 2020-10-01 DIAGNOSIS — W109XXA Fall (on) (from) unspecified stairs and steps, initial encounter: Secondary | ICD-10-CM | POA: Diagnosis not present

## 2020-10-01 DIAGNOSIS — M25511 Pain in right shoulder: Secondary | ICD-10-CM | POA: Diagnosis not present

## 2020-10-01 DIAGNOSIS — S0121XA Laceration without foreign body of nose, initial encounter: Secondary | ICD-10-CM | POA: Diagnosis not present

## 2020-10-01 DIAGNOSIS — M19011 Primary osteoarthritis, right shoulder: Secondary | ICD-10-CM | POA: Diagnosis not present

## 2020-10-01 DIAGNOSIS — I10 Essential (primary) hypertension: Secondary | ICD-10-CM | POA: Diagnosis not present

## 2020-10-01 DIAGNOSIS — E119 Type 2 diabetes mellitus without complications: Secondary | ICD-10-CM | POA: Diagnosis not present

## 2020-10-04 ENCOUNTER — Other Ambulatory Visit: Payer: Self-pay

## 2020-10-04 ENCOUNTER — Encounter: Payer: Self-pay | Admitting: Internal Medicine

## 2020-10-04 ENCOUNTER — Other Ambulatory Visit: Payer: Self-pay | Admitting: Internal Medicine

## 2020-10-04 ENCOUNTER — Ambulatory Visit (INDEPENDENT_AMBULATORY_CARE_PROVIDER_SITE_OTHER): Payer: Medicare Other

## 2020-10-04 ENCOUNTER — Ambulatory Visit (INDEPENDENT_AMBULATORY_CARE_PROVIDER_SITE_OTHER): Payer: Medicare Other | Admitting: Internal Medicine

## 2020-10-04 VITALS — BP 138/86 | HR 86 | Temp 98.2°F | Resp 16 | Ht 72.0 in | Wt 207.0 lb

## 2020-10-04 DIAGNOSIS — M159 Polyosteoarthritis, unspecified: Secondary | ICD-10-CM

## 2020-10-04 DIAGNOSIS — S4991XS Unspecified injury of right shoulder and upper arm, sequela: Secondary | ICD-10-CM

## 2020-10-04 DIAGNOSIS — S43014D Anterior dislocation of right humerus, subsequent encounter: Secondary | ICD-10-CM | POA: Diagnosis not present

## 2020-10-04 DIAGNOSIS — M8949 Other hypertrophic osteoarthropathy, multiple sites: Secondary | ICD-10-CM

## 2020-10-04 DIAGNOSIS — S0083XS Contusion of other part of head, sequela: Secondary | ICD-10-CM | POA: Diagnosis not present

## 2020-10-04 DIAGNOSIS — M79645 Pain in left finger(s): Secondary | ICD-10-CM | POA: Diagnosis not present

## 2020-10-04 DIAGNOSIS — M25511 Pain in right shoulder: Secondary | ICD-10-CM | POA: Diagnosis not present

## 2020-10-04 DIAGNOSIS — S6992XA Unspecified injury of left wrist, hand and finger(s), initial encounter: Secondary | ICD-10-CM

## 2020-10-04 MED ORDER — MELOXICAM 7.5 MG PO TABS: 7.5000 mg | ORAL_TABLET | Freq: Every day | ORAL | 0 refills | Status: DC

## 2020-10-04 MED ORDER — TRAMADOL HCL 50 MG PO TABS: 50.0000 mg | ORAL_TABLET | Freq: Three times a day (TID) | ORAL | 1 refills | Status: AC | PRN

## 2020-10-04 NOTE — Patient Instructions (Signed)
Shoulder Dislocation Your shoulder joint is made up of 3 bones:  The upper arm bone (humerus).  The shoulder blade (scapula).  The collarbone (clavicle). A shoulder dislocation happens when your upper arm bone moves out of its normal place in your shoulder joint. What are the causes? This condition is often caused by:  A fall.  A hard, direct hit to the shoulder.  A forceful movement of the shoulder. What increases the risk? You are more likely to develop this condition if you play sports. What are the signs or symptoms?   Bad shape (deformity) of the shoulder.  Very bad pain.  A shoulder that you cannot move.  Numbness, weakness, or tingling in your neck or down your arm.  Bruising or swelling around your shoulder. How is this treated? This condition is treated with a procedure called a reduction. This is done to place the upper arm bone back in the joint. There are two types of reduction:  Closed reduction. The upper arm bone is placed back in the joint without surgery. The doctor uses his or her hands to guide the bone back into place.  Open reduction. Surgery is done to place the upper arm bone back in the joint. This may be needed if: ? You have a weak shoulder joint or weak tissues that connect bones to each other (ligaments). ? You have had more than one shoulder dislocation. ? The nerves or blood vessels around your shoulder have been damaged. After the procedure, you will wear a brace or sling to prevent the arm from moving. After the brace or sling is removed, you will have physical therapy to help improve movement (range of motion) in your shoulder joint. Follow these instructions at home: Medicines  Take over-the-counter and prescription medicines only as told by your doctor.  Ask your doctor if the medicine prescribed to you: ? Requires you to avoid driving or using heavy machinery. ? Can cause trouble pooping (constipation). You may need to take steps to  prevent or treat trouble pooping:  Drink enough fluid to keep your pee (urine) pale yellow.  Take over-the-counter or prescription medicines.  Eat foods that are high in fiber. These include beans, whole grains, and fresh fruits and vegetables.  Limit foods that are high in fat and sugar. These include fried or sweet foods. If you have a brace or sling:  Wear the brace or sling as told by your doctor. Remove it only as told by your doctor.  Loosen the brace or sling if your fingers: ? Tingle. ? Become numb. ? Turn cold or blue.  Keep the brace or sling clean.  If the brace or sling is not waterproof: ? Do not let it get wet. ? Cover it with a watertight covering when you take a bath or shower. Managing pain, stiffness, and swelling   If told, put ice on the injured area. ? If you can remove your brace or sling, remove it as told by your doctor. ? Put ice in a plastic bag. ? Place a towel between your skin and the bag. ? Leave the ice on for 20 minutes, 2-3 times per day.  Move your fingers often.  Raise (elevate) the injured area above the level of your heart while you are sitting or lying down. Activity  Do not lift your arm above shoulder level until your doctor approves.  Do not lift anything until your doctor says that it is safe.  Do not push or pull  things until your doctor approves.  Return to your normal activities as told by your doctor. Ask your doctor what activities are safe for you.  Do range-of-motion exercises only as told by your doctor.  Exercise your hand by squeezing a soft ball. This keeps your hand and wrist from getting stiff and swollen. General instructions  Do not drive while you are wearing a brace or sling on a hand that you use for driving.  Do not take baths, swim, or use a hot tub until your doctor approves. Ask your doctor if you may take showers. You may only be allowed to take sponge baths.  Do not use any tobacco products,  including cigarettes, chewing tobacco, or e-cigarettes. These can delay healing. If you need help quitting, ask your doctor.  Keep all follow-up visits as told by your doctor. This is important. Contact a doctor if:  Your brace or sling gets damaged. Get help right away if:  Your pain gets worse, not better.  You lose feeling in your arm or hand.  Your arm or hand turns white and cold. Summary  A shoulder dislocation happens when your upper arm bone moves out of its normal place in your shoulder joint.  It is often caused by a fall, a strong hit to the shoulder, or a forceful movement of the shoulder.  It causes very bad pain. You may not be able to move your shoulder.  This condition is treated with either closed or open reduction. You will also be given a brace or sling. You will do exercises to improve movement in your shoulder joint.  Contact a doctor if your brace or sling gets damaged. Get help right away if your pain gets worse, you lose feeling in your arm or hand, or your arm or hand turns white or cold. This information is not intended to replace advice given to you by your health care provider. Make sure you discuss any questions you have with your health care provider. Document Revised: 06/24/2018 Document Reviewed: 06/24/2018 Elsevier Patient Education  [place].

## 2020-10-04 NOTE — Progress Notes (Signed)
Subjective:  Patient ID: Alan Lopez, male    DOB: October 09, 1946  Age: 74 y.o. MRN: [id number]  CC: Follow-up  This visit occurred during the SARS-CoV-2 public health emergency.  Safety protocols were in place, including screening questions prior to the visit, additional usage of staff PPE, and extensive cleaning of exam room while observing appropriate contact time as indicated for disinfecting solutions.    HPI [name] presents for f/up - He as in [place], [place] 3 days ago and fell - He injured his face, right shoulder, and left ring finger. He did not lose consciousness. He was one suture over his nose. His right shoulder xray was normal. His LRF and head were not xrayed. He did not lose consciousness. He denies HA/N/V/ataxia/paresthesias/bleeding/neck pain/low back pain. He was not offered anything for pain in the ED in [place]. He requests something for pain.  Outpatient Medications Prior to Visit  Medication Sig Dispense Refill  . alfuzosin (UROXATRAL) 10 MG 24 hr tablet Take 1 tablet (10 mg total) by mouth daily with breakfast. 90 tablet 3  . aspirin EC 81 MG tablet Take 81 mg by mouth daily.    . cholecalciferol (VITAMIN D) 1000 UNITS tablet Take 1,000 Units by mouth daily.    Marland Kitchen FARXIGA 10 MG TABS tablet TAKE ONE TABLET BY MOUTH DAILY 90 tablet 0  . finasteride (PROSCAR) 5 MG tablet Take 1 tablet (5 mg total) by mouth daily. 90 tablet 3  . glucose blood (CONTOUR NEXT TEST) test strip USE TO TEST BLOOD SUGAR 2-3 TIMES DAILY 200 each 1  . Insulin Disposable Pump (V-GO 30) KIT APPLY AS INSTRUCTED 30 kit 2  . Insulin Pen Needle 32G X 4 MM MISC Use Three per day to inject insulin 100 each 1  . insulin regular (NOVOLIN R,HUMULIN R) 100 units/mL injection Inject into the skin 3 (three) times daily before meals. USE INSULIN TO FILL UP v-GO PUMP    . lipase/protease/amylase (CREON) 36000 UNITS CPEP capsule Take 1 capsule (36,000 Units total) by mouth 3 (three) times daily before meals.  270 capsule 1  . losartan (COZAAR) 25 MG tablet TAKE ONE HALF TABLET BY MOUTH DAILY 45 tablet 3  . pioglitazone (ACTOS) 15 MG tablet TAKE ONE TABLET BY MOUTH DAILY 90 tablet 2  . rosuvastatin (CRESTOR) 10 MG tablet Take 1 tablet (10 mg total) by mouth at bedtime. 90 tablet 3  . tadalafil (CIALIS) 20 MG tablet Take 1 tablet (20 mg total) by mouth daily as needed for erectile dysfunction. 6 tablet 0  . fluticasone (FLONASE) 50 MCG/ACT nasal spray Place 2 sprays into both nostrils daily as needed for allergies (for seasonal allergies). Reported on 01/29/2016    . meloxicam (MOBIC) 7.5 MG tablet TAKE ONE TABLET BY MOUTH DAILY 90 tablet 0  . fluocinonide-emollient (LIDEX-E) 0.05 % cream Apply 1 application topically 2 (two) times daily. 30 g 0  . levofloxacin (LEVAQUIN) 750 MG tablet Take 1 tablet (750 mg total) by mouth daily. 1 tablet 0   No facility-administered medications prior to visit.    ROS Review of Systems  Constitutional: Negative.  Negative for appetite change, chills, diaphoresis, fatigue and unexpected weight change.  HENT: Negative.  Negative for congestion, dental problem, facial swelling, nosebleeds, rhinorrhea, sinus pain, trouble swallowing and voice change.   Eyes: Positive for discharge. Negative for visual disturbance.  Respiratory: Negative for cough, chest tightness, shortness of breath and wheezing.   Cardiovascular: Negative for chest pain, palpitations and leg swelling.  Gastrointestinal: Negative for abdominal pain, diarrhea, nausea and vomiting.  Endocrine: Negative.   Genitourinary: Negative.  Negative for difficulty urinating, dysuria and hematuria.  Musculoskeletal: Positive for arthralgias. Negative for back pain and neck pain.  Skin: Positive for wound.  Neurological: Negative.  Negative for dizziness, weakness, light-headedness, numbness and headaches.  Hematological: Negative for adenopathy. Does not bruise/bleed easily.  Psychiatric/Behavioral: Negative.      Objective:  BP 138/86   Pulse 86   Temp 98.2 F (36.8 C) (Oral)   Resp 16   Ht 6' (1.829 m)   Wt 207 lb (93.9 kg)   SpO2 94%   BMI 28.07 kg/m   BP Readings from Last 3 Encounters:  10/04/20 138/86  08/10/20 118/62  08/02/20 (!) 143/75    Wt Readings from Last 3 Encounters:  10/04/20 207 lb (93.9 kg)  08/10/20 209 lb 9.6 oz (95.1 kg)  07/17/20 207 lb (93.9 kg)    Physical Exam Vitals reviewed.  HENT:     Head: Abrasion and contusion present. No raccoon eyes, Battle's sign or laceration.     Comments: Face - Multiple healed abrasions, no TTP, step-offs    Nose: Nose normal. No nasal deformity or nasal tenderness.     Right Nostril: No epistaxis.     Left Nostril: No epistaxis.     Mouth/Throat:     Mouth: Mucous membranes are moist.  Eyes:     General: No scleral icterus.    Conjunctiva/sclera: Conjunctivae normal.  Cardiovascular:     Rate and Rhythm: Normal rate and regular rhythm.     Heart sounds: No murmur heard.   Pulmonary:     Effort: Pulmonary effort is normal.     Breath sounds: No stridor. No wheezing, rhonchi or rales.  Abdominal:     General: Abdomen is flat. Bowel sounds are normal.     Palpations: There is no hepatomegaly, splenomegaly or mass.     Tenderness: There is no abdominal tenderness. There is no guarding.  Musculoskeletal:        General: Normal range of motion.     Right shoulder: Tenderness and bony tenderness present.     Left shoulder: Normal. No swelling.     Right hand: Normal.     Cervical back: Neck supple.     Right lower leg: No edema.     Left lower leg: No edema.     Comments: Mild swelling in LRF, normal ROM-normal flexion/extension, normal sensation and capillary refill.  Lymphadenopathy:     Cervical: No cervical adenopathy.  Skin:    General: Skin is warm and dry.     Coloration: Skin is not pale.  Neurological:     General: No focal deficit present.     Mental Status: He is alert and oriented to person,  place, and time. Mental status is at baseline.  Psychiatric:        Mood and Affect: Mood normal.        Behavior: Behavior normal.     Lab Results  Component Value Date   WBC 8.3 04/13/2019   HGB 14.9 04/13/2019   HCT 42.8 04/13/2019   PLT 181.0 04/13/2019   GLUCOSE 115 (H) 08/08/2020   CHOL 110 12/20/2019   TRIG 52.0 12/20/2019   HDL 62.00 12/20/2019   LDLCALC 38 12/20/2019   ALT 41 08/08/2020   AST 29 08/08/2020   NA 138 08/08/2020   K 4.0 08/08/2020   CL 108 08/08/2020   CREATININE 0.90 08/08/2020  BUN 16 08/08/2020   CO2 22 08/08/2020   TSH 2.14 03/21/2020   HGBA1C 7.2 (H) 08/08/2020   MICROALBUR 1.9 12/20/2019    [place] Transrectal Complete  Result Date: 09/22/2020 CLINICAL DATA:  Ultrasound was provided for use by the ordering physician.  No provider Interpretation or professional fees incurred.    US Guided Needle Placement  Result Date: 09/22/2020 CLINICAL DATA:  Ultrasound was provided for use by the ordering physician.  No provider Interpretation or professional fees incurred.    [place] PROSTATE BIOPSY MULTIPLE  Result Date: 09/22/2020 CLINICAL DATA:  Ultrasound was provided for use by the ordering physician.  No provider Interpretation or professional fees incurred.    FINDINGS: No fracture. No evidence of acromioclavicular separation. No glenohumeral dislocation. No suspicious focal osseous lesions. Mild acromioclavicular joint osteoarthritis. No significant glenohumeral arthropathy. No radiopaque foreign bodies or pathologic soft tissue calcifications.  IMPRESSION: No fracture or malalignment. Mild right AC joint osteoarthritis.   Electronically Signed   By: Ilona Sorrel M.D.   On: 10/04/2020 14:13 EXAM: LEFT RING FINGER 2+V  COMPARISON:  None.  FINDINGS: No fracture or dislocation. No suspicious focal osseous lesions. No significant arthropathy. No radiopaque foreign body.  IMPRESSION: No fracture or  malalignment.   Electronically Signed   By: Ilona Sorrel M.D.   On: 10/04/2020 14:11  Assessment & Plan:   Ricard was seen today for follow-up.  Diagnoses and all orders for this visit:  Right shoulder injury, sequela -     DG Shoulder Right; Future  Injury of left ring finger, initial encounter- Plain films are negative for fracture. -     DG Finger Ring Left; Future  Anterior shoulder dislocation, right, subsequent encounter- There is fracture or recurrent dislocation. Will treat the pain and start OT. -     Ambulatory referral to Occupational Therapy -     traMADol (ULTRAM) 50 MG tablet; Take 1 tablet (50 mg total) by mouth every 8 (eight) hours as needed for up to 5 days. -     meloxicam (MOBIC) 7.5 MG tablet; Take 1 tablet (7.5 mg total) by mouth daily.  Primary osteoarthritis involving multiple joints -     meloxicam (MOBIC) 7.5 MG tablet; Take 1 tablet (7.5 mg total) by mouth daily.  Facial contusion, sequela- No complications noted. Wounds are healing well.   I have discontinued [name] "bill"'s fluticasone, fluocinonide-emollient, and levofloxacin. I have also changed his meloxicam. Additionally, I am having him start on traMADol. Lastly, I am having him maintain his cholecalciferol, aspirin EC, insulin regular, Contour Next Test, Insulin Pen Needle, losartan, rosuvastatin, lipase/protease/amylase, alfuzosin, finasteride, V-Go 30, pioglitazone, tadalafil, and Farxiga.  Meds ordered this encounter  Medications  . traMADol (ULTRAM) 50 MG tablet    Sig: Take 1 tablet (50 mg total) by mouth every 8 (eight) hours as needed for up to 5 days.    Dispense:  65 tablet    Refill:  1  . meloxicam (MOBIC) 7.5 MG tablet    Sig: Take 1 tablet (7.5 mg total) by mouth daily.    Dispense:  30 tablet    Refill:  0   I spent 50 minutes in preparing to see the patient by review of recent labs, imaging and procedures, obtaining and reviewing separately obtained history,  communicating with the patient and family or caregiver, ordering medications, tests or procedures, and documenting clinical information in the EHR including the differential Dx, treatment, and any further evaluation and other management of 1.  Right shoulder injury, sequela 2. Injury of left ring finger, initial encounter 3. Anterior shoulder dislocation, right, subsequent encounter 4. Primary osteoarthritis involving multiple joints 5. Facial contusion, sequela      Follow-up: Return in about 6 weeks (around 11/15/2020).  [name], MD

## 2020-10-09 ENCOUNTER — Other Ambulatory Visit: Payer: Self-pay | Admitting: *Deleted

## 2020-10-09 ENCOUNTER — Telehealth: Payer: Self-pay | Admitting: Endocrinology

## 2020-10-09 DIAGNOSIS — S0083XS Contusion of other part of head, sequela: Secondary | ICD-10-CM | POA: Insufficient documentation

## 2020-10-09 MED ORDER — DAPAGLIFLOZIN PROPANEDIOL 10 MG PO TABS
20.0000 mg | ORAL_TABLET | Freq: Every day | ORAL | 1 refills | Status: DC
Start: 2020-10-09 — End: 2021-02-21

## 2020-10-09 NOTE — Telephone Encounter (Signed)
Pt calling and said that Dr Dwyane Dee prescribed Wilder Glade 20mg s and that the pharmacy keeps switching it to 10mg  and wanted to see if we can switch it back. Please advise. Callback #:[phone]

## 2020-10-09 NOTE — Telephone Encounter (Signed)
lmtcb

## 2020-10-11 ENCOUNTER — Other Ambulatory Visit: Payer: Self-pay

## 2020-10-11 ENCOUNTER — Telehealth: Payer: Self-pay | Admitting: Internal Medicine

## 2020-10-11 ENCOUNTER — Other Ambulatory Visit: Payer: Self-pay | Admitting: Internal Medicine

## 2020-10-11 ENCOUNTER — Ambulatory Visit: Payer: Medicare Other | Attending: Family Medicine | Admitting: Occupational Therapy

## 2020-10-11 ENCOUNTER — Encounter: Payer: Self-pay | Admitting: Occupational Therapy

## 2020-10-11 DIAGNOSIS — M6281 Muscle weakness (generalized): Secondary | ICD-10-CM | POA: Insufficient documentation

## 2020-10-11 DIAGNOSIS — M25611 Stiffness of right shoulder, not elsewhere classified: Secondary | ICD-10-CM | POA: Diagnosis not present

## 2020-10-11 DIAGNOSIS — M25511 Pain in right shoulder: Secondary | ICD-10-CM | POA: Diagnosis not present

## 2020-10-11 DIAGNOSIS — S43014D Anterior dislocation of right humerus, subsequent encounter: Secondary | ICD-10-CM

## 2020-10-11 NOTE — Telephone Encounter (Signed)
MRI ordered

## 2020-10-11 NOTE — Therapy (Signed)
[facility] [address] [place], [place], [address] Phone: [phone]   Fax:  [phone]  Occupational Therapy Evaluation  Patient Details  Name: Alan Lopez MRN: [id number] Date of Birth: Jan 12, 1946 Referring Provider (OT): [name]   Encounter Date: 10/11/2020   OT End of Session - 10/11/20 1815    Visit Number 1    Number of Visits 9    Date for OT Re-Evaluation 12/25/20    Authorization Type Medicare    Progress Note Due on Visit 10    OT Start Time 1615    OT Stop Time 1700    OT Time Calculation (min) 45 min    Activity Tolerance Patient tolerated treatment well    Behavior During Therapy [facility] for tasks assessed/performed           Past Medical History:  Diagnosis Date  . 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 HTN (hypertension)   . Hypertrophy (benign) of prostate   . Multinodular goiter 04/12/2015  . Non-insulin dependent type 2 diabetes mellitus ([place])   . Status post non-ST elevation myocardial infarction (NSTEMI)    CATH  . Tobacco abuse   . Type II diabetes mellitus, uncontrolled ([place]) 04/12/2015    Past Surgical History:  Procedure Laterality Date  . CORONARY ANGIOPLASTY WITH STENT PLACEMENT      There were no vitals filed for this visit.   Subjective Assessment - 10/11/20 1626    Subjective  I fell - I was stepping up and missed the step    Currently in Pain? No/denies    Multiple Pain Sites No             [facility] OT Assessment - 10/11/20 0001      Assessment   Medical Diagnosis right shoulder injury    Referring Provider (OT) [name]    Hand Dominance Right    Prior Therapy [facility] for arthritis right shoulder      Precautions   Precaution Comments No longer using sling      Prior Function   Level of Independence Independent with basic ADLs    Vocation Retired    Leisure travel, Human resources officer Expression   Dominant Hand Right      Cognition    Overall Cognitive Status Within Functional Limits for tasks assessed      Observation/Other Assessments   Focus on Therapeutic Outcomes (FOTO)  NA      Posture/Postural Control   Posture/Postural Control No significant limitations      Sensation   Light Touch Impaired by gross assessment   If not properly positioned gets numbness in hand at night     Coordination   Gross Motor Movements are Fluid and Coordinated No    Fine Motor Movements are Fluid and Coordinated Yes    Coordination Lacks shoulder flexion, abduction and external rotation so needs to place arm on table and walk fingers forward to adjust position      ROM / Strength   AROM / PROM / Strength AROM;PROM;Strength      AROM   Overall AROM  Deficits    Overall AROM Comments Limited shoulder flexion, abduction and external rotation RUE      PROM   Overall PROM  Deficits    PROM Assessment Site Shoulder    Right/Left Shoulder Right    Right Shoulder Flexion 100 Degrees    Right Shoulder ABduction 90 Degrees  Strength   Overall Strength Deficits    Overall Strength Comments Unable to sustain position against gravity flexion, abduction external rotation    Strength Assessment Site Shoulder      Hand Function   Right Hand Gross Grasp Functional    Right Hand Grip (lbs) 65    Right Hand Lateral Pinch 20 lbs    Left Hand Gross Grasp Functional    Left Hand Grip (lbs) 65    Left Hand Lateral Pinch 19 lbs                           OT Education - 10/11/20 1814    Education Details Reviewed results of OT eval - discussed need for imaging to determine if muscle tear before going forward with treatment    Person(s) Educated Patient;Child(ren)    Methods Explanation    Comprehension Verbalized understanding            OT Short Term Goals - 10/11/20 1822      OT SHORT TERM GOAL #1   Title Patient will complete a home exercise program designed for self range of motion in RUE    Time 4     Period Weeks    Status New    Target Date 11/25/20      OT SHORT TERM GOAL #2   Title Patient will demonstrate PROM RUE shoulder flexion/abduction  to 145 degrees without report of pain    Time 4    Period Weeks    Status New      OT SHORT TERM GOAL #3   Title Patient will demonstrate 10 degrees of active external rotation of Right GH joint in dependent humeral position    Time 4    Period Weeks    Status New             OT Long Term Goals - 10/11/20 1824      OT LONG TERM GOAL #1   Title Patient will complete updated HEP to address active motion and strength in right shoulder    Time 8    Period Weeks    Status New    Target Date 12/25/20      OT LONG TERM GOAL #2   Title Patient will demonstrate ability to reach with right hand to waist height to obtain a 2 lb object without assistance and without pain    Time 8    Period Weeks    Status New      OT LONG TERM GOAL #3   Title Patient will reach to chest height cabinet to obtain a lightweight less than 1 lb object with active assist and without pain x 3    Time 8    Period Weeks    Status New      OT LONG TERM GOAL #4   Title Patient will demonstrate sufficient shoudler range of motion and isolated control to turn steering wheel (Bilaterally)  for right hand turn without pain increase in Right shoulder    Time 8    Period Weeks    Status New                 Plan - 10/11/20 1816    Clinical Impression Statement Patient is a 74 yr old male with recent fall and injury to right shoulder.  Patient's initial work up indicates no dislocation, but both son and patient feel humerus was anteriorly displaced, andin setting  up for X ray - displacement was reduced.  Patient presents to OT eval with very little pain, but significantly limited active and passive motion in right shoulder - specifically in flexion, abduction and external rotation.  Messgaed patient's referring MD to request imaging to rule out muscle tear  before initiating subsequent treatment.    OT Occupational Profile and History Problem Focused Assessment - Including review of records relating to presenting problem    Occupational performance deficits (Please refer to evaluation for details): ADL's;IADL's;Rest and Sleep;Leisure    Body Structure / Function / Physical Skills ADL;Coordination;GMC;UE functional use;Sensation;Decreased knowledge of precautions;Flexibility;IADL;Pain;Strength;Dexterity;Edema;Mobility;ROM    Rehab Potential Good    Clinical Decision Making Limited treatment options, no task modification necessary    Comorbidities Affecting Occupational Performance: None    Modification or Assistance to Complete Evaluation  No modification of tasks or assist necessary to complete eval    OT Frequency 1x / week    OT Duration 8 weeks    OT Treatment/Interventions Self-care/ADL training;Electrical Stimulation;Therapeutic exercise;Patient/family education;Neuromuscular education;Moist Heat;Therapeutic activities;Passive range of motion;Manual Therapy;DME and/or AE instruction;Ultrasound;Cryotherapy    Plan Check for MRI results - may have muscle tear.  If ok - begin gentle range of motion right shoulder - may do best with AAROM    Consulted and Agree with Plan of Care Patient;Family member/caregiver           Patient will benefit from skilled therapeutic intervention in order to improve the following deficits and impairments:   Body Structure / Function / Physical Skills: ADL, Coordination, GMC, UE functional use, Sensation, Decreased knowledge of precautions, Flexibility, IADL, Pain, Strength, Dexterity, Edema, Mobility, ROM       Visit Diagnosis: Stiffness of right shoulder, not elsewhere classified - Plan: Ot plan of care cert/re-cert  Acute pain of right shoulder - Plan: Ot plan of care cert/re-cert  Muscle weakness (generalized) - Plan: Ot plan of care cert/re-cert    Problem List Patient Active Problem List    Diagnosis Date Noted  . Anterior shoulder dislocation, right, subsequent encounter 10/04/2020  . Elevated PSA 07/17/2020  . Exocrine pancreatic insufficiency 06/15/2020  . Allergic reaction to insect bite 06/05/2020  . Essential hypertension 04/13/2019  . 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  . Benign prostatic hyperplasia with urinary obstruction 01/29/2017  . Gynecomastia, male 01/29/2017  . Type II diabetes mellitus with complication ([place]) 85/63/1497  . S/P PTCA (percutaneous transluminal coronary angioplasty) 12/29/2015  . NSTEMI (non-ST elevated myocardial infarction) ([place]) 12/29/2015  . Coronary artery disease due to lipid rich plaque 12/29/2015  . Hyperlipidemia LDL goal <70 12/29/2015  . Multinodular goiter 04/12/2015  . Blindness of left eye 04/12/2015    Mariah Milling , OTR/L 10/11/2020, 6:29 PM  [facility] [address] [place], [place], [address] Phone: [phone]   Fax:  [phone]  Name: Alan Lopez MRN: [id number] Date of Birth: Apr 24, 1946

## 2020-10-11 NOTE — Telephone Encounter (Signed)
-----   Message from Mariah Milling, OT sent at 10/11/2020  4:53 PM EDT ----- Dr Ronnald Ramp, I am evaluating Alan Lopez, and I feel it may be worthwhile to investigate any partial muscle tear after his right shoulder injury.  He has significantly limited shoulder flexion, abduction, and external rotation.  Would any imaging be warranted?   His pain is not significant, but his movement very limited.   It would be helpful to have this info before moving forward with OT to address.    Thanks so much!Antony Salmon, OTR/L 10/11/20 4:56 PM Phone: [phone] Fax: [phone]

## 2020-10-12 ENCOUNTER — Telehealth: Payer: Self-pay | Admitting: Internal Medicine

## 2020-10-12 ENCOUNTER — Other Ambulatory Visit: Payer: Self-pay | Admitting: Endocrinology

## 2020-10-12 ENCOUNTER — Other Ambulatory Visit: Payer: Self-pay | Admitting: Cardiology

## 2020-10-12 DIAGNOSIS — Z23 Encounter for immunization: Secondary | ICD-10-CM | POA: Diagnosis not present

## 2020-10-12 NOTE — Telephone Encounter (Signed)
Patient was referred to [facility] for a MRI but they will not be able to do it unti November 23rd and he was wondering if he could go anywhere else so he could start PT

## 2020-10-16 DIAGNOSIS — S46811A Strain of other muscles, fascia and tendons at shoulder and upper arm level, right arm, initial encounter: Secondary | ICD-10-CM | POA: Diagnosis not present

## 2020-10-16 DIAGNOSIS — R600 Localized edema: Secondary | ICD-10-CM | POA: Diagnosis not present

## 2020-10-16 DIAGNOSIS — M62511 Muscle wasting and atrophy, not elsewhere classified, right shoulder: Secondary | ICD-10-CM | POA: Diagnosis not present

## 2020-10-16 DIAGNOSIS — M19011 Primary osteoarthritis, right shoulder: Secondary | ICD-10-CM | POA: Diagnosis not present

## 2020-10-16 DIAGNOSIS — S46011A Strain of muscle(s) and tendon(s) of the rotator cuff of right shoulder, initial encounter: Secondary | ICD-10-CM | POA: Diagnosis not present

## 2020-10-16 DIAGNOSIS — S43014D Anterior dislocation of right humerus, subsequent encounter: Secondary | ICD-10-CM | POA: Diagnosis not present

## 2020-10-16 DIAGNOSIS — M24211 Disorder of ligament, right shoulder: Secondary | ICD-10-CM | POA: Diagnosis not present

## 2020-10-17 ENCOUNTER — Telehealth: Payer: Self-pay | Admitting: Internal Medicine

## 2020-10-17 NOTE — Telephone Encounter (Signed)
Patient called and said that he received the results from his recent MRI and he does not understand what they are saying. He was wanting a call back at [phone]

## 2020-10-18 ENCOUNTER — Other Ambulatory Visit: Payer: Self-pay | Admitting: Internal Medicine

## 2020-10-18 ENCOUNTER — Ambulatory Visit: Payer: Medicare Other | Admitting: Occupational Therapy

## 2020-10-18 DIAGNOSIS — M75101 Unspecified rotator cuff tear or rupture of right shoulder, not specified as traumatic: Secondary | ICD-10-CM

## 2020-10-18 NOTE — Telephone Encounter (Signed)
Called pt, LVM.   

## 2020-10-18 NOTE — Telephone Encounter (Signed)
Pt has returned called and been informed of results. He expressed understanding.

## 2020-10-19 ENCOUNTER — Ambulatory Visit (INDEPENDENT_AMBULATORY_CARE_PROVIDER_SITE_OTHER): Payer: Medicare Other | Admitting: Orthopaedic Surgery

## 2020-10-19 DIAGNOSIS — M75121 Complete rotator cuff tear or rupture of right shoulder, not specified as traumatic: Secondary | ICD-10-CM | POA: Diagnosis not present

## 2020-10-19 NOTE — Progress Notes (Signed)
Office Visit Note   Patient: Alan Lopez           Date of Birth: [birth year]           MRN: [id number] Visit Date: 10/19/2020              Requested by: Janith Lima, MD [address] [place],  [place] [address] PCP: Janith Lima, MD   Assessment & Plan: Visit Diagnoses:  1. Nontraumatic complete tear of right rotator cuff     Plan: I did let him know that I would not recommend arthroscopic surgery for his right shoulder.  The rotator cuff is not in a position where it can be repaired.  There is significant muscle atrophy and this is a chronic tear.  At some point he may benefit from a reverse shoulder arthroplasty and we can always refer him to someone for that if it becomes more symptomatic.  I would hold off on physical therapy now to let the right shoulder stiffened.  We will see him back in 2 weeks and I would like three views of the right shoulder at that visit.  All questions and concerns were answered and addressed.  Follow-Up Instructions: Return in about 2 weeks (around 11/02/2020).   Orders:  No orders of the defined types were placed in this encounter.  No orders of the defined types were placed in this encounter.     Procedures: No procedures performed   Clinical Data: No additional findings.   Subjective: Chief Complaint  Patient presents with   Right Shoulder - Pain  The patient is a very pleasant 74 year old gentleman who is right-hand dominant.  He had a mechanical fall recently on 24 October and he did sustain an anterior dislocation of his right shoulder.  He had numbness and tingling in his hand and he was getting x-rays the shoulder pop back in place.  He had never injured the shoulder before but has had some chronic pain and weakness with the shoulder.  A MRI was obtained of the right shoulder does show a full-thickness retracted rotator cuff tear with muscle atrophy.  The humeral head is slightly high riding showing that this is rotator  cuff arthropathy is well.  He is actually scheduled to go on a cruise the next week.  He denies any numbness and tingling in his hand today and reports only mild shoulder pain that subsides with Tylenol.  He does report significant right shoulder weakness though.  He is a diabetic and it sounds like his hemoglobin A1c has been anywhere from 7-9.  He has never had right shoulder surgery before.  HPI  Review of Systems He currently denies any headache, chest pain, shortness of breath, fever, chills, nausea, vomiting  Objective: Vital Signs: There were no vitals taken for this visit.  Physical Exam He is alert and orient x3 and in no acute distress Ortho Exam Examination of his right shoulder shows it is clinically well located.  There is significant weakness of the rotator cuff with difficulty with abduction and external rotation. Specialty Comments:  No specialty comments available.  Imaging: No results found. The MRI of his right shoulder does show a full-thickness retracted rotator cuff tear of the infraspinatus and supraspinatus.  This retracted all the way back to the glenohumeral joint.  There is significant muscle atrophy as well.  The humeral head is slightly high riding.  PMFS History: Patient Active Problem List   Diagnosis Date Noted  Tear of right supraspinatus tendon 10/18/2020   Elevated PSA 07/17/2020   Exocrine pancreatic insufficiency 06/15/2020   Essential hypertension 04/13/2019   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   Benign prostatic hyperplasia with urinary obstruction 01/29/2017   Gynecomastia, male 01/29/2017   Type II diabetes mellitus with complication ([facility] [facility]) 48/18/5631   S/P PTCA (percutaneous transluminal coronary angioplasty) 12/29/2015   NSTEMI (non-ST elevated myocardial infarction) ([place]) 12/29/2015   Coronary artery disease due to lipid rich plaque 12/29/2015   Hyperlipidemia LDL goal <70 12/29/2015   Multinodular  goiter 04/12/2015   Blindness of left eye 04/12/2015   Past Medical History:  Diagnosis Date   Blindness of left eye 04/12/2015   HTN (hypertension)    Hypertrophy (benign) of prostate    Multinodular goiter 04/12/2015   Non-insulin dependent type 2 diabetes mellitus ([place])    Status post non-ST elevation myocardial infarction (NSTEMI)    CATH   Tobacco abuse    Type II diabetes mellitus, uncontrolled ([place]) 04/12/2015    Family History  Problem Relation Age of Onset   Diabetes Mother    Diabetes Brother    Cancer Brother    Heart disease Neg Hx     Past Surgical History:  Procedure Laterality Date   CORONARY ANGIOPLASTY WITH STENT PLACEMENT     Social History   Occupational History   Occupation: retired  Tobacco Use   Smoking status: Light Tobacco Smoker    Types: Cigarettes   Smokeless tobacco: Never Used   Tobacco comment: smokes on social occassions  Vaping Use   Vaping Use: Never used  Substance and Sexual Activity   Alcohol use: Yes    Alcohol/week: 10.0 standard drinks    Types: 10 Cans of beer per week    Comment: socially   Drug use: No   Sexual activity: Never

## 2020-10-20 DIAGNOSIS — Z20822 Contact with and (suspected) exposure to covid-19: Secondary | ICD-10-CM | POA: Diagnosis not present

## 2020-10-20 DIAGNOSIS — Z03818 Encounter for observation for suspected exposure to other biological agents ruled out: Secondary | ICD-10-CM | POA: Diagnosis not present

## 2020-11-01 ENCOUNTER — Other Ambulatory Visit: Payer: Self-pay

## 2020-11-01 ENCOUNTER — Encounter: Payer: Self-pay | Admitting: Occupational Therapy

## 2020-11-01 ENCOUNTER — Ambulatory Visit (INDEPENDENT_AMBULATORY_CARE_PROVIDER_SITE_OTHER): Payer: Medicare Other

## 2020-11-01 ENCOUNTER — Ambulatory Visit (INDEPENDENT_AMBULATORY_CARE_PROVIDER_SITE_OTHER): Payer: Medicare Other | Admitting: Orthopaedic Surgery

## 2020-11-01 ENCOUNTER — Encounter: Payer: Self-pay | Admitting: Orthopaedic Surgery

## 2020-11-01 ENCOUNTER — Ambulatory Visit: Payer: Medicare Other | Admitting: Occupational Therapy

## 2020-11-01 DIAGNOSIS — M6281 Muscle weakness (generalized): Secondary | ICD-10-CM | POA: Diagnosis not present

## 2020-11-01 DIAGNOSIS — M25611 Stiffness of right shoulder, not elsewhere classified: Secondary | ICD-10-CM

## 2020-11-01 DIAGNOSIS — M75121 Complete rotator cuff tear or rupture of right shoulder, not specified as traumatic: Secondary | ICD-10-CM

## 2020-11-01 DIAGNOSIS — M25511 Pain in right shoulder: Secondary | ICD-10-CM | POA: Diagnosis not present

## 2020-11-01 NOTE — Patient Instructions (Addendum)
Access Code: [email or web address] URL: [email or web address] Date: 11/01/2020 Prepared by: Marlowe Sax  Exercises Shoulder Shrugs - 2 x daily - 7 x weekly - 3 sets - 10 reps Supine Lower Trapezius Strengthening - 2 x daily - 7 x weekly - 3 sets - 10 reps Supine Elbow Flexion Extension AROM - 2 x daily - 7 x weekly - 3 sets - 10 reps  Seated with hand on handle of cane.   Press arm forward, and side to side.    Press arm forward, then lean forward.    Standing at table.  Bend at hips - use right arm to slide forward, backward, side to side.  Can try gentle lift from table.   Move slowly!

## 2020-11-01 NOTE — Progress Notes (Signed)
The patient comes in today for continued follow-up as it relates to his right shoulder.  He is 74 years old.  He had a hard mechanical fall when he was in [place] back in October and dislocated his shoulder.  This was then reduced.  When I saw him 2 weeks ago a MRI had been obtained of his right shoulder.  He has a full-thickness and retracted rotator cuff tear of the infraspinatus and supraspinatus with muscle atrophy and the retraction was all the way back to the level of the glenoid.  I wanted to see him in the office today for x-rays of his shoulder and to talk about possible options.  He says he is not at the level of function that he hoped to be at.  He is scheduled for OT therapy this afternoon on his right shoulder.  This is his dominant side.  He says is still very bothersome to him and it hurts with overhead activities and he has profound weakness with overhead activities.  On exam his right shoulder is clinically well located.  The rotator cuff is significantly weak and he has a lot of pain past 90 degrees of abduction.  His external rotation and abduction show profound weakness.  3 views of the right shoulder show that his shoulder is well located and there is not significant arthritis of the glenohumeral joint.  There is moderate AC joint arthritis and the humeral head is slightly high riding.  At this point I would like to send him for an opinion to my partner Dr. Alphonzo Severance so he can discuss surgical options with the patient given his previous high-level function and his desire to have better function in his right shoulder.  He agrees with this referral.

## 2020-11-01 NOTE — Therapy (Signed)
[facility] [address] [place], [place], [address] Phone: [phone]   Fax:  [phone]  Occupational Therapy Treatment  Patient Details  Name: Alan Lopez MRN: [id number] Date of Birth: 11/06/1946 Referring Provider (OT): [name]   Encounter Date: 11/01/2020   OT End of Session - 11/01/20 1803    Visit Number 2    Number of Visits 9    Date for OT Re-Evaluation 12/25/20    Authorization Type Medicare    Progress Note Due on Visit 10    OT Start Time 1615    OT Stop Time 1700    OT Time Calculation (min) 45 min    Activity Tolerance Patient tolerated treatment well    Behavior During Therapy [facility] for tasks assessed/performed           Past Medical History:  Diagnosis Date  . 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 HTN (hypertension)   . Hypertrophy (benign) of prostate   . Multinodular goiter 04/12/2015  . Non-insulin dependent type 2 diabetes mellitus ([place])   . Status post non-ST elevation myocardial infarction (NSTEMI)    CATH  . Tobacco abuse   . Type II diabetes mellitus, uncontrolled ([place]) 04/12/2015    Past Surgical History:  Procedure Laterality Date  . CORONARY ANGIOPLASTY WITH STENT PLACEMENT      There were no vitals filed for this visit.   Subjective Assessment - 11/01/20 1622    Subjective  They said I have a bad tear.    Currently in Pain? Yes    Pain Score 1     Pain Location Shoulder    Pain Orientation Right    Pain Descriptors / Indicators Aching    Pain Type Acute pain    Pain Onset 1 to 4 weeks ago    Pain Frequency Intermittent    Aggravating Factors  reaching behind, reaching into adduction,    Multiple Pain Sites No                        OT Treatments/Exercises (OP) - 11/01/20 0001      ADLs   ADL Comments Reviewed recent imaging results/ MD  visits.  Patient with full thickness RC tear (supra/infraspinatus)  Reviewed and revised short and long term  goals based on this diagnosis.  Patient islikely facing surgical repair of right shoulder.        Shoulder Exercises: Supine   Other Supine Exercises Shoulder shrug and depression.  Active.      Other Supine Exercises Recommended supine position with elbow positioned higher than GH joint      Shoulder Exercises: Seated   Other Seated Exercises UE Ranger to support weight of hand, then combinations of active assisted shoulder flexion with elbow extension, shoulder abd/adduction       Shoulder Exercises: Standing   Other Standing Exercises Standing flexed at hips to allow arm to tabletop for flexion/ext, abd/add in gravity assisted position.                    OT Education - 11/01/20 1814    Education Details HEP assisted range of motion in seated and standing, supine shrugs and depression    Person(s) Educated Patient    Methods Explanation;Demonstration;Tactile cues;Verbal cues;Handout    Comprehension Verbalized understanding;Returned demonstration;Need further instruction;Verbal cues required            OT Short Term Goals - 11/01/20 1807  OT SHORT TERM GOAL #1   Title Patient will complete a home exercise program designed for self range of motion in RUE    Time 4    Period Weeks    Status On-going    Target Date 11/25/20      OT SHORT TERM GOAL #2   Title Patient will demonstrate PROM RUE shoulder flexion/abduction  to 85 degrees without report of pain    Time 4    Period Weeks    Status Revised      OT SHORT TERM GOAL #3   Title Patient will demonstrate 10 degrees of active external rotation of Right [place]oll[place] joint in dependent humeral position    Time 4    Period Weeks    Status On-going             OT Long Term Goals - 11/01/20 1807      OT LONG TERM GOAL #1   Title Patient will complete updated HEP to address active motion and strength in right shoulder    Status On-going      OT LONG TERM GOAL #2   Title Patient will demonstrate ability to use  assisted  reach with right hand to waist height to obtain a 2 lb object without assistance and without pain    Status Revised      OT LONG TERM GOAL #3   Title Patient will demo assisted reach to chest height cabinet to obtain a lightweight less than 1 lb object with active assist and without pain x 3    Status Revised      OT LONG TERM GOAL #4   Title Patient will demonstrate sufficient shoudler range of motion and isolated control to turn steering wheel (Bilaterally)  for right hand turn without pain increase in Right shoulder    Status On-going                 Plan - 11/01/20 1804    Clinical Impression Statement Patient has been diagnosed with a full thickness supra/infraspinatus tear with retraction.  Patient is being referred for second orthopedic surgery consult to determine if/what surgery is required to help return shoulder function.  Patient may be able to benefit from therapy to prevent secondary impairments, and to decrease pain.    OT Occupational Profile and History Problem Focused Assessment - Including review of records relating to presenting problem    Occupational performance deficits (Please refer to evaluation for details): ADL's;IADL's;Rest and Sleep;Leisure    Body Structure / Function / Physical Skills ADL;Coordination;GMC;UE functional use;Sensation;Decreased knowledge of precautions;Flexibility;IADL;Pain;Strength;Dexterity;Edema;Mobility;ROM    Rehab Potential Good    Clinical Decision Making Limited treatment options, no task modification necessary    Comorbidities Affecting Occupational Performance: None    Modification or Assistance to Complete Evaluation  No modification of tasks or assist necessary to complete eval    OT Frequency 1x / week    OT Duration 8 weeks    OT Treatment/Interventions Self-care/ADL training;Electrical Stimulation;Therapeutic exercise;Patient/family education;Neuromuscular education;Moist Heat;Therapeutic activities;Passive range of  motion;Manual Therapy;DME and/or AE instruction;Ultrasound;Cryotherapy    Plan Review HEP, add exercise,  manual to stretch anterior shoulder, gentle mobilization.    Consulted and Agree with Plan of Care Patient           Patient will benefit from skilled therapeutic intervention in order to improve the following deficits and impairments:   Body Structure / Function / Physical Skills: ADL, Coordination, GMC, UE functional use, Sensation, Decreased knowledge of precautions,  Flexibility, IADL, Pain, Strength, Dexterity, Edema, Mobility, ROM       Visit Diagnosis: Stiffness of right shoulder, not elsewhere classified  Acute pain of right shoulder  Muscle weakness (generalized)    Problem List Patient Active Problem List   Diagnosis Date Noted  . Tear of right supraspinatus tendon 10/18/2020  . Elevated PSA 07/17/2020  . Exocrine pancreatic insufficiency 06/15/2020  . Essential hypertension 04/13/2019  . 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  . Benign prostatic hyperplasia with urinary obstruction 01/29/2017  . Gynecomastia, male 01/29/2017  . Type II diabetes mellitus with complication ([place]) 08/65/7846  . S/P PTCA (percutaneous transluminal coronary angioplasty) 12/29/2015  . NSTEMI (non-ST elevated myocardial infarction) ([place]) 12/29/2015  . Coronary artery disease due to lipid rich plaque 12/29/2015  . Hyperlipidemia LDL goal <70 12/29/2015  . Multinodular goiter 04/12/2015  . Blindness of left eye 04/12/2015    Mariah Milling, OTR/L 11/01/2020, 6:14 PM  [facility] [address] [place], [place], [address] Phone: [phone]   Fax:  [phone]  Name: Alan Lopez MRN: [id number] Date of Birth: 03-25-1946

## 2020-11-07 ENCOUNTER — Other Ambulatory Visit: Payer: Self-pay

## 2020-11-07 ENCOUNTER — Ambulatory Visit: Payer: Medicare Other | Admitting: Occupational Therapy

## 2020-11-07 ENCOUNTER — Encounter: Payer: Self-pay | Admitting: Occupational Therapy

## 2020-11-07 DIAGNOSIS — M6281 Muscle weakness (generalized): Secondary | ICD-10-CM

## 2020-11-07 DIAGNOSIS — M25611 Stiffness of right shoulder, not elsewhere classified: Secondary | ICD-10-CM

## 2020-11-07 DIAGNOSIS — M25511 Pain in right shoulder: Secondary | ICD-10-CM | POA: Diagnosis not present

## 2020-11-07 NOTE — Therapy (Signed)
[facility] [address] [place], [place], [address] Phone: [phone]   Fax:  [phone]  Occupational Therapy Treatment  Patient Details  Name: Alan Lopez MRN: [id number] Date of Birth: 1946/06/20 Referring Provider (OT): [name]   Encounter Date: 11/07/2020   OT End of Session - 11/07/20 1415    Visit Number 3    Number of Visits 9    Date for OT Re-Evaluation 12/25/20    Authorization Type Medicare    Progress Note Due on Visit 10    OT Start Time 1235    OT Stop Time 1315    OT Time Calculation (min) 40 min    Activity Tolerance Patient tolerated treatment well    Behavior During Therapy [facility] for tasks assessed/performed           Past Medical History:  Diagnosis Date   Blindness of left eye 04/12/2015   HTN (hypertension)    Hypertrophy (benign) of prostate    Multinodular goiter 04/12/2015   Non-insulin dependent type 2 diabetes mellitus ([place])    Status post non-ST elevation myocardial infarction (NSTEMI)    CATH   Tobacco abuse    Type II diabetes mellitus, uncontrolled ([place]) 04/12/2015    Past Surgical History:  Procedure Laterality Date   CORONARY ANGIOPLASTY WITH STENT PLACEMENT      There were no vitals filed for this visit.   Subjective Assessment - 11/07/20 1408    Subjective  I have been doing the exercises and I feel like it is moving a little better. (re right shoulder)    Currently in Pain? Yes    Pain Score 1     Pain Location Shoulder    Pain Orientation Right    Pain Descriptors / Indicators Aching    Pain Type Acute pain    Pain Onset 1 to 4 weeks ago    Aggravating Factors  reaching beyond available range    Pain Relieving Factors heat, tylenol, avoiding increased range of motion in shoulder                        OT Treatments/Exercises (OP) - 11/07/20 0001      Shoulder Exercises: Seated   Other Seated Exercises Working to preserve  remaining musculature around right shoulder.  Started with scapular retraction, elevation, depression in pain free ranges.  Patient with improving active motion in right shoulder, but consistently with pain and malalignment with humerus near 70+ degrees of shoulder flexion in upright postures.  Patient has insufficient supporting musculature in Rotatir Cuff to sustain head of humerus in socket through all ranges - most evident where gravity most influential.  Worked to increase sustained activation of arm into surface - UE ranger against wall, and in increased ranges.  Ball under elbow to help maintain alignment with shoulder abduction and flexion in mid range.  Patient sees Dr Marlou Sa next week to determine next steps.  Patient is not opposed to surgery, if surgery can help him regain functional use of dominant arm.  Patient wants to defer any surgery until after Christmas as he has plans to visit family in [place] over the holidays.                      OT Short Term Goals - 11/07/20 1418      OT SHORT TERM GOAL #1   Title Patient will complete a home exercise program designed for self  range of motion in RUE    Time 4    Period Weeks    Status Achieved    Target Date 11/25/20      OT SHORT TERM GOAL #2   Title Patient will demonstrate PROM RUE shoulder flexion/abduction  to 85 degrees without report of pain    Time 4    Period Weeks    Status Revised      OT SHORT TERM GOAL #3   Title Patient will demonstrate 10 degrees of active external rotation of Right GH joint in dependent humeral position    Time 4    Period Weeks    Status On-going             OT Long Term Goals - 11/07/20 1425      OT LONG TERM GOAL #1   Title Patient will complete updated HEP to address active motion and strength in right shoulder    Status On-going      OT LONG TERM GOAL #2   Title Patient will demonstrate ability to use assisted  reach with right hand to waist height to obtain a 2 lb object  without assistance and without pain    Status Revised      OT LONG TERM GOAL #3   Title Patient will demo assisted reach to chest height cabinet to obtain a lightweight less than 1 lb object with active assist and without pain x 3    Status Revised      OT LONG TERM GOAL #4   Title Patient will demonstrate sufficient shoudler range of motion and isolated control to turn steering wheel (Bilaterally)  for right hand turn without pain increase in Right shoulder    Status On-going                 Plan - 11/07/20 1416    Clinical Impression Statement Patient continues to feel mild shoulder discomfort regularly, and has moments when shoulder is poorly aligned and locked out of position.  Patient sees surgeon for consult next week.    OT Occupational Profile and History Problem Focused Assessment - Including review of records relating to presenting problem    Occupational performance deficits (Please refer to evaluation for details): ADL's;IADL's;Rest and Sleep;Leisure    Body Structure / Function / Physical Skills ADL;Coordination;GMC;UE functional use;Sensation;Decreased knowledge of precautions;Flexibility;IADL;Pain;Strength;Dexterity;Edema;Mobility;ROM    Rehab Potential Good    Clinical Decision Making Limited treatment options, no task modification necessary    Comorbidities Affecting Occupational Performance: None    Modification or Assistance to Complete Evaluation  No modification of tasks or assist necessary to complete eval    OT Frequency 1x / week    OT Duration 8 weeks    OT Treatment/Interventions Self-care/ADL training;Electrical Stimulation;Therapeutic exercise;Patient/family education;Neuromuscular education;Moist Heat;Therapeutic activities;Passive range of motion;Manual Therapy;DME and/or AE instruction;Ultrasound;Cryotherapy    Plan See what Dr Marlou Sa said regarding next steps, scap stabilization, proximal stability, gentle assisted motion in pain free ranges.    OT Home  Exercise Plan guided /assisted exercise on cane for UE support    Consulted and Agree with Plan of Care Patient           Patient will benefit from skilled therapeutic intervention in order to improve the following deficits and impairments:   Body Structure / Function / Physical Skills: ADL, Coordination, GMC, UE functional use, Sensation, Decreased knowledge of precautions, Flexibility, IADL, Pain, Strength, Dexterity, Edema, Mobility, ROM       Visit Diagnosis: Stiffness  of right shoulder, not elsewhere classified  Acute pain of right shoulder  Muscle weakness (generalized)    Problem List Patient Active Problem List   Diagnosis Date Noted   Tear of right supraspinatus tendon 10/18/2020   Elevated PSA 07/17/2020   Exocrine pancreatic insufficiency 06/15/2020   Essential hypertension 04/13/2019   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   Benign prostatic hyperplasia with urinary obstruction 01/29/2017   Gynecomastia, male 01/29/2017   Type II diabetes mellitus with complication ([place]) 00/34/9179   S/P PTCA (percutaneous transluminal coronary angioplasty) 12/29/2015   NSTEMI (non-ST elevated myocardial infarction) ([place]) 12/29/2015   Coronary artery disease due to lipid rich plaque 12/29/2015   Hyperlipidemia LDL goal <70 12/29/2015   Multinodular goiter 04/12/2015   Blindness of left eye 04/12/2015    Mariah Milling, OTR/L 11/07/2020, 2:26 PM  [facility] [address] [place], [place], [address] Phone: [phone]   Fax:  [phone]  Name: Alan Lopez MRN: [id number] Date of Birth: 17-Jan-1946

## 2020-11-09 ENCOUNTER — Ambulatory Visit: Payer: Medicare Other | Admitting: Cardiology

## 2020-11-11 ENCOUNTER — Other Ambulatory Visit: Payer: Self-pay | Admitting: Endocrinology

## 2020-11-11 ENCOUNTER — Other Ambulatory Visit: Payer: Self-pay | Admitting: Cardiology

## 2020-11-13 ENCOUNTER — Ambulatory Visit (INDEPENDENT_AMBULATORY_CARE_PROVIDER_SITE_OTHER): Payer: Medicare Other | Admitting: Orthopedic Surgery

## 2020-11-13 ENCOUNTER — Encounter: Payer: Self-pay | Admitting: Orthopedic Surgery

## 2020-11-13 VITALS — Ht 72.0 in | Wt 208.0 lb

## 2020-11-13 DIAGNOSIS — M12811 Other specific arthropathies, not elsewhere classified, right shoulder: Secondary | ICD-10-CM | POA: Diagnosis not present

## 2020-11-13 DIAGNOSIS — M75121 Complete rotator cuff tear or rupture of right shoulder, not specified as traumatic: Secondary | ICD-10-CM | POA: Diagnosis not present

## 2020-11-15 ENCOUNTER — Ambulatory Visit: Payer: Medicare Other | Admitting: Occupational Therapy

## 2020-11-16 ENCOUNTER — Encounter: Payer: Self-pay | Admitting: Occupational Therapy

## 2020-11-16 NOTE — Therapy (Unsigned)
[facility] [address] [place], [place], [address] Phone: [phone]   Fax:  [phone]  Occupational Therapy Treatment  Patient Details  Name: Alan Lopez MRN: [id number] Date of Birth: Aug 08, 1946 Referring Provider (OT): [name]   Encounter Date: 11/16/2020    Past Medical History:  Diagnosis Date  . 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 HTN (hypertension)   . Hypertrophy (benign) of prostate   . Multinodular goiter 04/12/2015  . Non-insulin dependent type 2 diabetes mellitus ([place])   . Status post non-ST elevation myocardial infarction (NSTEMI)    CATH  . Tobacco abuse   . Type II diabetes mellitus, uncontrolled ([place]) 04/12/2015    Past Surgical History:  Procedure Laterality Date  . CORONARY ANGIOPLASTY WITH STENT PLACEMENT      There were no vitals filed for this visit.   Patient has not returned for additional therapy - Patient has full thickness tear of Rotator Cuff.  Patient anticipating surgical intervention and conferring with Ortho Surgery.                          OT Short Term Goals - 11/07/20 1418      OT SHORT TERM GOAL #1   Title Patient will complete a home exercise program designed for self range of motion in RUE    Time 4    Period Weeks    Status Achieved    Target Date 11/25/20      OT SHORT TERM GOAL #2   Title Patient will demonstrate PROM RUE shoulder flexion/abduction  to 85 degrees without report of pain    Time 4    Period Weeks    Status Revised      OT SHORT TERM GOAL #3   Title Patient will demonstrate 10 degrees of active external rotation of Right GH joint in dependent humeral position    Time 4    Period Weeks    Status On-going             OT Long Term Goals - 11/07/20 1425      OT LONG TERM GOAL #1   Title Patient will complete updated HEP to address active motion and strength in right shoulder    Status On-going      OT LONG  TERM GOAL #2   Title Patient will demonstrate ability to use assisted  reach with right hand to waist height to obtain a 2 lb object without assistance and without pain    Status Revised      OT LONG TERM GOAL #3   Title Patient will demo assisted reach to chest height cabinet to obtain a lightweight less than 1 lb object with active assist and without pain x 3    Status Revised      OT LONG TERM GOAL #4   Title Patient will demonstrate sufficient shoudler range of motion and isolated control to turn steering wheel (Bilaterally)  for right hand turn without pain increase in Right shoulder    Status On-going                  Patient will benefit from skilled therapeutic intervention in order to improve the following deficits and impairments:           Visit Diagnosis: No diagnosis found.    Problem List Patient Active Problem List   Diagnosis Date Noted  . Tear of right supraspinatus tendon 10/18/2020  .  Elevated PSA 07/17/2020  . Exocrine pancreatic insufficiency 06/15/2020  . Essential hypertension 04/13/2019  . 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  . Benign prostatic hyperplasia with urinary obstruction 01/29/2017  . Gynecomastia, male 01/29/2017  . Type II diabetes mellitus with complication ([place]) 99/83/3825  . S/P PTCA (percutaneous transluminal coronary angioplasty) 12/29/2015  . NSTEMI (non-ST elevated myocardial infarction) ([place]) 12/29/2015  . Coronary artery disease due to lipid rich plaque 12/29/2015  . Hyperlipidemia LDL goal <70 12/29/2015  . Multinodular goiter 04/12/2015  . Blindness of left eye 04/12/2015   OCCUPATIONAL THERAPY DISCHARGE SUMMARY  Visits from Start of Care: Eval + 2 visits  Current functional level related to goals / functional outcomes: Patient with decreased stability and faulty alignment of [place] joint after full thickness RC tear.     Remaining deficits: Pain, range of motion Right shoulder   Education  / Equipment: Active assisted motion in pain free ranges Plan: Patient agrees to discharge.  Patient goals were not met. Patient is being discharged due to not returning since the last visit.  ?????     Mariah Milling, OTR/L 11/16/2020, 1:17 PM  [facility] [address] [place], [place], [address] Phone: [phone]   Fax:  [phone]  Name: Alan Lopez MRN: [id number] Date of Birth: Dec 05, 1946

## 2020-11-17 ENCOUNTER — Encounter: Payer: Self-pay | Admitting: Orthopedic Surgery

## 2020-11-17 NOTE — Progress Notes (Signed)
Office Visit Note   Patient: Alan Lopez           Date of Birth: [birth year]           MRN: [id number] Visit Date: 11/13/2020 Requested by: Janith Lima, MD [address] [place],  [place] [address] PCP: Janith Lima, MD  Subjective: Chief Complaint  Patient presents with  . Right Shoulder - Pain    HPI: Alan Lopez is a 74 year old patient with right shoulder pain.  He fell 10/01/2020 and dislocated his shoulder.  He was having some right shoulder pain prior to that.  Takes Tylenol with relief.  He states that he has decreased range of motion and decreased strength in the right shoulder region.  Hard for him to do leg because he likes to workout in the yard.  Also hard for him to use the backpack blower.  Has not played golf in about 10 years.  MRI scan is reviewed.  He has infraspinatus tear with retraction supraspinatus tear with retraction and upper subscapularis tear with medial subluxation of the biceps.  He states that occupational therapy is helping him some.              ROS: All systems reviewed are negative as they relate to the chief complaint within the history of present illness.  Patient denies  fevers or chills.   Assessment & Plan: Visit Diagnoses:  1. Rotator cuff arthropathy of right shoulder   2. Nontraumatic complete tear of right rotator cuff     Plan: Impression is right shoulder rotator cuff arthropathy secondary to shoulder dislocation with significant tearing and retraction of the rotator cuff musculature.  Patient has limited active motion of that right arm.  Nonetheless he currently is in occupational therapy and he states that that is helping some.  I think Alan Lopez is likely heading for reverse shoulder replacement if he does not regain more function in the shoulder.  We will check him back in about 2 months for clinical recheck and for further discussion about replacement.  It is possible he may choose to live with the amount of function that he has.  Left  shoulder is fully functional without limitation.  Follow-Up Instructions: Return in about 8 weeks (around 01/08/2021).   Orders:  No orders of the defined types were placed in this encounter.  No orders of the defined types were placed in this encounter.     Procedures: No procedures performed   Clinical Data: No additional findings.  Objective: Vital Signs: Ht 6' (1.829 m)   Wt 208 lb (94.3 kg)   BMI 28.21 kg/m   Physical Exam:   Constitutional: Patient appears well-developed HEENT:  Head: Normocephalic Eyes:EOM are normal Neck: Normal range of motion Cardiovascular: Normal rate Pulmonary/chest: Effort normal Neurologic: Patient is alert Skin: Skin is warm Psychiatric: Patient has normal mood and affect    Ortho Exam: Ortho exam demonstrates on the right-hand side functional deltoid muscle.  Shoulder is located.  He has active forward flexion 50 degrees active abduction 50 degrees and external rotation of 15 degrees of abduction on both sides to about 60.  Subscap strength 4+ out of 5 on the right 5+ out of 5 on the left.  Passively I can get him above 90 degrees of forward flexion and abduction.  Motor or sensory function to the hand is intact  Specialty Comments:  No specialty comments available.  Imaging: No results found.   PMFS History: Patient Active Problem List  Diagnosis Date Noted  . Tear of right supraspinatus tendon 10/18/2020  . Elevated PSA 07/17/2020  . Exocrine pancreatic insufficiency 06/15/2020  . Essential hypertension 04/13/2019  . 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  . Benign prostatic hyperplasia with urinary obstruction 01/29/2017  . Gynecomastia, male 01/29/2017  . Type II diabetes mellitus with complication ([place]) 99/77/4142  . S/P PTCA (percutaneous transluminal coronary angioplasty) 12/29/2015  . NSTEMI (non-ST elevated myocardial infarction) ([place]) 12/29/2015  . Coronary artery disease due to lipid rich  plaque 12/29/2015  . Hyperlipidemia LDL goal <70 12/29/2015  . Multinodular goiter 04/12/2015  . Blindness of left eye 04/12/2015   Past Medical History:  Diagnosis Date  . 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 HTN (hypertension)   . Hypertrophy (benign) of prostate   . Multinodular goiter 04/12/2015  . Non-insulin dependent type 2 diabetes mellitus ([place])   . Status post non-ST elevation myocardial infarction (NSTEMI)    CATH  . Tobacco abuse   . Type II diabetes mellitus, uncontrolled ([place]) 04/12/2015    Family History  Problem Relation Age of Onset  . Diabetes Mother   . Diabetes Brother   . Cancer Brother   . Heart disease Neg Hx     Past Surgical History:  Procedure Laterality Date  . CORONARY ANGIOPLASTY WITH STENT PLACEMENT     Social History   Occupational History  . Occupation: retired  Tobacco Use  . Smoking status: Light Tobacco Smoker    Types: Cigarettes  . Smokeless tobacco: Never Used  . Tobacco comment: smokes on social occassions  Vaping Use  . Vaping Use: Never used  Substance and Sexual Activity  . Alcohol use: Yes    Alcohol/week: 10.0 standard drinks    Types: 10 Cans of beer per week    Comment: socially  . Drug use: No  . Sexual activity: Never

## 2020-11-22 ENCOUNTER — Ambulatory Visit: Payer: Medicare Other | Admitting: Occupational Therapy

## 2020-11-23 ENCOUNTER — Ambulatory Visit (INDEPENDENT_AMBULATORY_CARE_PROVIDER_SITE_OTHER): Payer: Medicare Other | Admitting: Cardiology

## 2020-11-23 ENCOUNTER — Other Ambulatory Visit: Payer: Self-pay

## 2020-11-23 ENCOUNTER — Encounter: Payer: Self-pay | Admitting: Cardiology

## 2020-11-23 VITALS — BP 130/70 | HR 69 | Ht 72.0 in | Wt 204.8 lb

## 2020-11-23 DIAGNOSIS — E119 Type 2 diabetes mellitus without complications: Secondary | ICD-10-CM

## 2020-11-23 DIAGNOSIS — I2510 Atherosclerotic heart disease of native coronary artery without angina pectoris: Secondary | ICD-10-CM | POA: Diagnosis not present

## 2020-11-23 DIAGNOSIS — E785 Hyperlipidemia, unspecified: Secondary | ICD-10-CM

## 2020-11-23 DIAGNOSIS — I10 Essential (primary) hypertension: Secondary | ICD-10-CM | POA: Diagnosis not present

## 2020-11-23 DIAGNOSIS — I2583 Coronary atherosclerosis due to lipid rich plaque: Secondary | ICD-10-CM

## 2020-11-23 NOTE — Patient Instructions (Signed)
Medication Instructions:  The current medical regimen is effective;  continue present plan and medications.  *If you need a refill on your cardiac medications before your next appointment, please call your pharmacy*  Follow-Up: At [facility], you and your health needs are our priority.  As part of our continuing mission to provide you with exceptional heart care, we have created designated Provider Care Teams.  These Care Teams include your primary Cardiologist (physician) and Advanced Practice Providers (APPs -  Physician Assistants and Nurse Practitioners) who all work together to provide you with the care you need, when you need it.  We recommend signing up for the patient portal called "MyChart".  Sign up information is provided on this After Visit Summary.  MyChart is used to connect with patients for Virtual Visits (Telemedicine).  Patients are able to view lab/test results, encounter notes, upcoming appointments, etc.  Non-urgent messages can be sent to your provider as well.   To learn more about what you can do with MyChart, go to [email or web address].    Your next appointment:   12 month(s)  The format for your next appointment:   In Person  Provider:   Mark Skains, MD   Thank you for choosing [facility]!!      

## 2020-11-23 NOTE — Progress Notes (Signed)
Cardiology Office Note:    Date:  11/23/2020   ID:  Alan Lopez, DOB [birth year], MRN [id number]  PCP:  Alan Lima, MD  [facility] Cardiologist:  Alan Furbish, MD  [facility] Electrophysiologist:  None   Referring MD: Alan Lima, MD     History of Present Illness:    Alan Lopez is a 74 y.o. male here for follow-up of coronary disease hypertension hyperlipidemia with diabetes.  Cardiac catheterization 12/12/2015: Prior DES to right coronary artery in the setting of MI. This was performed at [facility]. Had transient no reflow. LVEDP was 16. EF was 45-50 at the time with inferior wall hypokinesis.  At the switch off of Brilinta to Plavix because of shortness of breath.  At 1 point was having full body aches worried about cholesterol medication.  Widowed, wife died of omental cancer Nov 16, 2018. Alan Lopez is his neighbor, lives at [facility].  While in [place], therefore formula 1 race, ended up falling, misstep, tore his right rotator cuff.  Has seen Dr. Marlou Sa.  Would need full shoulder replacement.  He is contemplating.   Past Medical History:  Diagnosis Date   Blindness of left eye 04/12/2015   HTN (hypertension)    Hypertrophy (benign) of prostate    Multinodular goiter 04/12/2015   Non-insulin dependent type 2 diabetes mellitus ([place])    Status post non-ST elevation myocardial infarction (NSTEMI)    CATH   Tobacco abuse    Type II diabetes mellitus, uncontrolled ([place]) 04/12/2015    Past Surgical History:  Procedure Laterality Date   CORONARY ANGIOPLASTY WITH STENT PLACEMENT      Current Medications: No outpatient medications have been marked as taking for the 11/23/20 encounter (Office Visit) with Jerline Pain, MD.     Allergies:   Atorvastatin and Codeine   Social History   Socioeconomic History   Marital status: Widowed    Spouse name: Not on file   Number of children: 3   Years of  education: Not on file   Highest education level: Not on file  Occupational History   Occupation: retired  Tobacco Use   Smoking status: Light Tobacco Smoker    Types: Cigarettes   Smokeless tobacco: Never Used   Tobacco comment: smokes on social occassions  Vaping Use   Vaping Use: Never used  Substance and Sexual Activity   Alcohol use: Yes    Alcohol/week: 10.0 standard drinks    Types: 10 Cans of beer per week    Comment: socially   Drug use: No   Sexual activity: Never  Other Topics Concern   Not on file  Social History Narrative   Not on file   Social Determinants of Health   Financial Resource Strain: Not on file  Food Insecurity: Not on file  Transportation Needs: Not on file  Physical Activity: Not on file  Stress: Not on file  Social Connections: Not on file     Family History: The patient's family history includes Cancer in his brother; Diabetes in his brother and mother. There is no history of Heart disease.  ROS:   Please see the history of present illness.     All other systems reviewed and are negative.  EKGs/Labs/Other Studies Reviewed:    The following studies were reviewed today:   Cardiac catheterization 12/12/2015 at [facility]: - RCA 80 to 90% lesion, single DES placed.  Also had first diagonal ostial lesion of 40%.  LVEDP 16.  EF 45-50 with inferior wall hypokinesis.  EKG:  EKG is  ordered today.  The ekg ordered today demonstrates sinus rhythm 69 with no other abnormalities.  Recent Labs: 03/21/2020: TSH 2.14 08/08/2020: ALT 41; BUN 16; Creatinine, Ser 0.90; Potassium 4.0; Sodium 138  Recent Lipid Panel    Component Value Date/Time   CHOL 110 12/20/2019 0858   CHOL 120 01/18/2019 0846   TRIG 52.0 12/20/2019 0858   HDL 62.00 12/20/2019 0858   HDL 67 01/18/2019 0846   CHOLHDL 2 12/20/2019 0858   VLDL 10.4 12/20/2019 0858   LDLCALC 38 12/20/2019 0858   LDLCALC 39 01/18/2019 0846     Risk  Assessment/Calculations:       Physical Exam:    VS:  BP 130/70    Pulse 69    Ht 6' (1.829 m)    Wt 204 lb 12.8 oz (92.9 kg)    SpO2 96%    BMI 27.78 kg/m     Wt Readings from Last 3 Encounters:  11/23/20 204 lb 12.8 oz (92.9 kg)  11/13/20 208 lb (94.3 kg)  10/04/20 207 lb (93.9 kg)     GEN:  Well nourished, well developed in no acute distress HEENT: Normal NECK: No JVD; No carotid bruits LYMPHATICS: No lymphadenopathy CARDIAC: RRR, no murmurs, rubs, gallops RESPIRATORY:  Clear to auscultation without rales, wheezing or rhonchi  ABDOMEN: Soft, non-tender, non-distended MUSCULOSKELETAL:  No edema; No deformity, limitation in raising his right arm from shoulder SKIN: Warm and dry NEUROLOGIC:  Alert and oriented x 3 PSYCHIATRIC:  Normal affect   ASSESSMENT:    1. Coronary artery disease due to lipid rich plaque   2. Essential hypertension   3. Hyperlipidemia LDL goal <70   4. Diabetes mellitus with coincident hypertension (HCC)    PLAN:    In order of problems listed above:  Coronary artery disease post MI inferior with RCA stent placement in 2017 -Cardiac catheterization reviewed above at [facility]. Initially had marked T wave inversions on ECG. -We will switch from Brilinta to Plavix because of shortness of breath. -Participating cardiac rehab, [facility]. Overall feels well without any major symptoms.  Hyperlipidemia -Changed to Crestor at 1 point because of fatigue. Doing very well. LDL at last check was 38. ALT 41  Diabetes with hyperlipidemia -Dr. Dwyane Dee, Dr. Ronnald Ramp been following. 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 7.2  Bradycardia -Previous visit we stopped his metoprolol because of heart rate of 55. Today his heart rate is 69. Good response.  1 year follow-up       Medication Adjustments/Labs and Tests Ordered: Current medicines are reviewed at length with the patient today.  Concerns regarding medicines are outlined above.  No orders of  the defined types were placed in this encounter.  No orders of the defined types were placed in this encounter.   Patient Instructions  Medication Instructions:  The current medical regimen is effective;  continue present plan and medications.  *If you need a refill on your cardiac medications before your next appointment, please call your pharmacy*  Follow-Up: At [facility], you and your health needs are our priority.  As part of our continuing mission to provide you with exceptional heart care, we have created designated Provider Care Teams.  These Care Teams include your primary Cardiologist (physician) and Advanced Practice Providers (APPs -  Physician Assistants and Nurse Practitioners) who all work together to provide you with the care you need, when you need it.  We recommend signing up for the patient portal called "MyChart".  Sign up information is provided on this After Visit Summary.  MyChart is used to connect with patients for Virtual Visits (Telemedicine).  Patients are able to view lab/test results, encounter notes, upcoming appointments, etc.  Non-urgent messages can be sent to your provider as well.   To learn more about what you can do with MyChart, go to [email or web address].    Your next appointment:   12 month(s)  The format for your next appointment:   In Person  Provider:   Candee Furbish, MD  Thank you for choosing [facility]!!        Signed, Alan Furbish, MD  11/23/2020 9:40 AM    [place]

## 2020-11-24 NOTE — Addendum Note (Signed)
Addended by: Maren Beach, Adylene Dlugosz A on: 11/24/2020 08:41 AM   Modules accepted: Orders

## 2020-12-07 DIAGNOSIS — Z20822 Contact with and (suspected) exposure to covid-19: Secondary | ICD-10-CM | POA: Diagnosis not present

## 2020-12-13 ENCOUNTER — Ambulatory Visit: Payer: Medicare Other | Admitting: Orthopedic Surgery

## 2020-12-13 ENCOUNTER — Ambulatory Visit: Payer: Medicare Other | Admitting: Occupational Therapy

## 2020-12-13 ENCOUNTER — Other Ambulatory Visit: Payer: Self-pay

## 2020-12-13 MED ORDER — LOSARTAN POTASSIUM 25 MG PO TABS
25.0000 mg | ORAL_TABLET | Freq: Every day | ORAL | 3 refills | Status: DC
Start: 2020-12-13 — End: 2021-12-27

## 2020-12-18 ENCOUNTER — Other Ambulatory Visit: Payer: Self-pay

## 2020-12-18 ENCOUNTER — Other Ambulatory Visit (INDEPENDENT_AMBULATORY_CARE_PROVIDER_SITE_OTHER): Payer: Medicare Other

## 2020-12-18 DIAGNOSIS — E1142 Type 2 diabetes mellitus with diabetic polyneuropathy: Secondary | ICD-10-CM | POA: Diagnosis not present

## 2020-12-18 DIAGNOSIS — E042 Nontoxic multinodular goiter: Secondary | ICD-10-CM

## 2020-12-18 LAB — BASIC METABOLIC PANEL
BUN: 26 mg/dL — ABNORMAL HIGH (ref 6–23)
CO2: 23 mEq/L (ref 19–32)
Calcium: 8.7 mg/dL (ref 8.4–10.5)
Chloride: 107 mEq/L (ref 96–112)
Creatinine, Ser: 0.92 mg/dL (ref 0.40–1.50)
GFR: 81.17 mL/min (ref >60.00)
Glucose, Bld: 186 mg/dL — ABNORMAL HIGH (ref 70–99)
Potassium: 3.8 mEq/L (ref 3.5–5.1)
Sodium: 136 mEq/L (ref 135–145)

## 2020-12-18 LAB — HEMOGLOBIN A1C: Hgb A1c MFr Bld: 7.5 % — ABNORMAL HIGH (ref 4.6–6.5)

## 2020-12-18 LAB — TSH: TSH: 1.8 u[IU]/mL (ref 0.35–4.50)

## 2020-12-19 LAB — MICROALBUMIN / CREATININE URINE RATIO
Creatinine,U: 52.5 mg/dL
Microalb Creat Ratio: 1.3 mg/g (ref 0.0–30.0)
Microalb, Ur: <0.7 mg/dL (ref 0.0–1.9)

## 2020-12-20 ENCOUNTER — Ambulatory Visit: Payer: Medicare Other | Admitting: Occupational Therapy

## 2020-12-21 ENCOUNTER — Encounter: Payer: Self-pay | Admitting: Endocrinology

## 2020-12-21 ENCOUNTER — Ambulatory Visit (INDEPENDENT_AMBULATORY_CARE_PROVIDER_SITE_OTHER): Payer: Medicare Other | Admitting: Endocrinology

## 2020-12-21 ENCOUNTER — Other Ambulatory Visit: Payer: Self-pay

## 2020-12-21 VITALS — BP 140/78 | HR 67 | Temp 98.8°F | Resp 16 | Ht 72.0 in | Wt 202.0 lb

## 2020-12-21 DIAGNOSIS — E1165 Type 2 diabetes mellitus with hyperglycemia: Secondary | ICD-10-CM | POA: Diagnosis not present

## 2020-12-21 DIAGNOSIS — I10 Essential (primary) hypertension: Secondary | ICD-10-CM | POA: Diagnosis not present

## 2020-12-21 DIAGNOSIS — E042 Nontoxic multinodular goiter: Secondary | ICD-10-CM | POA: Diagnosis not present

## 2020-12-21 DIAGNOSIS — Z794 Long term (current) use of insulin: Secondary | ICD-10-CM

## 2020-12-21 DIAGNOSIS — E7800 Pure hypercholesterolemia, unspecified: Secondary | ICD-10-CM | POA: Diagnosis not present

## 2020-12-21 NOTE — Patient Instructions (Addendum)
Actos 30mg  daily  Check blood sugars on waking up 3 days a week  Also check blood sugars about 2 hours after meals and do this after different meals by rotation  Recommended blood sugar levels on waking up are 90-130 and about 2 hours after meal is 130-160  Please bring your blood sugar monitor to each visit, thank you

## 2020-12-21 NOTE — Progress Notes (Signed)
Patient ID: Alan Lopez, male   DOB: 1946/05/24, 75 y.o.   MRN: [id number]            Reason for Appointment: Follow-up for Type 2 Diabetes   History of Present Illness:          Date of diagnosis of type 2 diabetes mellitus :  1999      Past history:  He had modestly increase blood sugars at diagnosis and was started on metformin Subsequently glipizide was added to improve his control. He thinks that his blood sugars have been mostly poorly controlled over the last several years He had been on glipizide and metformin for years without any adjustment of his dosage of glipizide  Review of his A1c shows that it had ranged from 9-9.5 since 07/2013 Previously his PCP tried him on a sample of Jardiance 10 mg without significant improvement  He was started on the V-go pump on 07/02/17 because of inconsistent control and poor compliance with mealtime insulin  Recent history:   Oral hypoglycemic drugs the patient is taking are: Actos 15 mg, Farxiga 5 mg daily  Current INSULIN regimen: V-go pump SETTINGS: Basal rate = 30 units BOLUSES = 3-4 units at breakfast, 8 at lunch and  6-10 U at dinner  A1c is 7.5  Fructosamine previously 225  Current blood sugar patterns and problems identified:  He did bring his monitor for download but has only 4 readings in the last month  Lab fasting glucose was 186  However difficult to know if his fasting readings are consistently high and he does not think so  He may have slightly higher readings after breakfast although not clear if blood sugar is high both before and after eating  Again taking half of the 10 mg Farxiga because of cost  Is usually adjusting his bolus clicks based on only carbs he is eating but is not follow-up and check his sugars after eating  Trying to limit cereal and banana to only twice a week  He has not done consistent exercise, depending on the weather he will go out for walking or other yard work related  activities  Weight is slightly lower recently  This is despite somewhat inconsistent diet over the holidays  Side effects from medications have been: Mild  diarrhea from regular metformin  Compliance with the medical regimen: fair      Glucose monitoring:  done less than once a day usually       Glucometer:   Contour   Blood sugars by download   PRE-MEAL Fasting Lunch Dinner Bedtime Overall  Glucose range:  140    138   Mean/median:      68   POST-MEAL PC Breakfast PC Lunch PC Dinner  Glucose range:  138-200,    Mean/median:          Self-care: The diet that the patient has been following is: tries to limit high carbohydrate foods. eating out about 4 times a week     Typical meal intake: Breakfast is  Toast/meat .  Usually 2 servings of vegetables in the evening and avoiding potatoes and bread usually Usually avoiding drinks with sugar                Dietician visit, most recent: 05/2015  CDE visit: 01/2016  Weight history: Maximum weight 215 previously  Wt Readings from Last 3 Encounters:  12/21/20 202 lb (91.6 kg)  11/23/20 204 lb 12.8 oz (92.9 kg)  11/13/20 208 lb (  94.3 kg)    Glycemic control:    Lab Results  Component Value Date   HGBA1C 7.5 (H) 12/18/2020   HGBA1C 7.2 (H) 08/08/2020   HGBA1C 7.4 (H) 03/21/2020   Lab Results  Component Value Date   MICROALBUR <0.7 12/18/2020   LDLCALC 38 12/20/2019   CREATININE 0.92 12/18/2020    Lab on 12/18/2020  Component Date Value Ref Range Status  . TSH 12/18/2020 1.80  0.35 - 4.50 uIU/mL Final  . Microalb, Ur 12/18/2020 <0.7  0.0 - 1.9 mg/dL Final  . Creatinine,U 12/18/2020 52.5  mg/dL Final  . Microalb Creat Ratio 12/18/2020 1.3  0.0 - 30.0 mg/g Final  . Sodium 12/18/2020 136  135 - 145 mEq/L Final  . Potassium 12/18/2020 3.8  3.5 - 5.1 mEq/L Final  . Chloride 12/18/2020 107  96 - 112 mEq/L Final  . CO2 12/18/2020 23  19 - 32 mEq/L Final  . Glucose, Bld 12/18/2020 186* 70 - 99 mg/dL Final  . BUN  12/18/2020 26* 6 - 23 mg/dL Final  . Creatinine, Ser 12/18/2020 0.92  0.40 - 1.50 mg/dL Final  . GFR 12/18/2020 81.17  >60.00 mL/min Final   Calculated using the CKD-EPI Creatinine Equation (2021)  . Calcium 12/18/2020 8.7  8.4 - 10.5 mg/dL Final  . Hgb A1c MFr Bld 12/18/2020 7.5* 4.6 - 6.5 % Final   Glycemic Control Guidelines for People with Diabetes:Non Diabetic:  <6%Goal of Therapy: <7%Additional Action Suggested:  >8%       Allergies as of 12/21/2020      Reactions   Atorvastatin Other (See Comments)   Codeine Other (See Comments)   Patient doesn't like how it makes him feel, "spaced out, not together"      Medication List       Accurate as of December 21, 2020  9:14 AM. If you have any questions, ask your nurse or doctor.        alfuzosin 10 MG 24 hr tablet Commonly known as: UROXATRAL Take 1 tablet (10 mg total) by mouth daily with breakfast.   aspirin EC 81 MG tablet Take 81 mg by mouth daily.   cholecalciferol 1000 units tablet Commonly known as: VITAMIN D Take 1,000 Units by mouth daily.   dapagliflozin propanediol 10 MG Tabs tablet Commonly known as: Farxiga Take 2 tablets (20 mg total) by mouth daily. What changed: how much to take   finasteride 5 MG tablet Commonly known as: PROSCAR Take 1 tablet (5 mg total) by mouth daily.   Insulin Pen Needle 32G X 4 MM Misc Use Three per day to inject insulin   insulin regular 100 units/mL injection Commonly known as: NOVOLIN R Inject into the skin 3 (three) times daily before meals. USE INSULIN TO FILL UP v-GO PUMP   losartan 25 MG tablet Commonly known as: COZAAR Take 1 tablet (25 mg total) by mouth daily.   meloxicam 7.5 MG tablet Commonly known as: MOBIC Take 1 tablet (7.5 mg total) by mouth daily.   pioglitazone 15 MG tablet Commonly known as: ACTOS TAKE ONE TABLET BY MOUTH DAILY   rosuvastatin 10 MG tablet Commonly known as: Crestor Take 1 tablet (10 mg total) by mouth at bedtime.   tadalafil 20  MG tablet Commonly known as: CIALIS Take 1 tablet (20 mg total) by mouth daily as needed for erectile dysfunction.   V-Go 30 Kit APPLY AS INSTRUCTED       Allergies:  Allergies  Allergen Reactions  . Atorvastatin Other (  See Comments)  . Codeine Other (See Comments)    Patient doesn't like how it makes him feel, "spaced out, not together"    Past Medical History:  Diagnosis Date  . 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 HTN (hypertension)   . Hypertrophy (benign) of prostate   . Multinodular goiter 04/12/2015  . Non-insulin dependent type 2 diabetes mellitus ([place])   . Status post non-ST elevation myocardial infarction (NSTEMI)    CATH  . Tobacco abuse   . Type II diabetes mellitus, uncontrolled ([place]) 04/12/2015    Past Surgical History:  Procedure Laterality Date  . CORONARY ANGIOPLASTY WITH STENT PLACEMENT      Family History  Problem Relation Age of Onset  . Diabetes Mother   . Diabetes Brother   . Cancer Brother   . Heart disease Neg Hx     Social History:  reports that he has been smoking cigarettes. He has never used smokeless tobacco. He reports current alcohol use of about 10.0 standard drinks of alcohol per week. He reports that he does not use drugs.    Review of Systems     Lipid history:   Has history of CAD and myocardial infarction  He is on 10 mg Crestor prescribed by his cardiologist  LDL is below 70 but has not had any recent labs  Labs as follows:   Lab Results  Component Value Date   CHOL 110 12/20/2019   HDL 62.00 12/20/2019   LDLCALC 38 12/20/2019   TRIG 52.0 12/20/2019   CHOLHDL 2 12/20/2019           Eyes: He has had blindness in his left eye from old injury   His last eye exam was in 08/2020  THYROID: Previously was found to have a 2 cm nodule on his left thyroid on his exam but ultrasound in 04/2015 showed multinodular goiter with mostly small nodules and the largest nodule on the left side of 1.4 cm has the appearance of a pseudo-  nodule  Thyroid levels have been normal  Lab Results  Component Value Date   TSH 1.80 12/18/2020    NEUROPATHY: Has feeling of numbness on the soles of his feet which is not present consistently Also may feel a sensation of numbness when his foot is on the car accelerator pedal He has no numbness in the toes No burning or tingling   LABS:  Lab on 12/18/2020  Component Date Value Ref Range Status  . TSH 12/18/2020 1.80  0.35 - 4.50 uIU/mL Final  . Microalb, Ur 12/18/2020 <0.7  0.0 - 1.9 mg/dL Final  . Creatinine,U 12/18/2020 52.5  mg/dL Final  . Microalb Creat Ratio 12/18/2020 1.3  0.0 - 30.0 mg/g Final  . Sodium 12/18/2020 136  135 - 145 mEq/L Final  . Potassium 12/18/2020 3.8  3.5 - 5.1 mEq/L Final  . Chloride 12/18/2020 107  96 - 112 mEq/L Final  . CO2 12/18/2020 23  19 - 32 mEq/L Final  . Glucose, Bld 12/18/2020 186* 70 - 99 mg/dL Final  . BUN 12/18/2020 26* 6 - 23 mg/dL Final  . Creatinine, Ser 12/18/2020 0.92  0.40 - 1.50 mg/dL Final  . GFR 12/18/2020 81.17  >60.00 mL/min Final   Calculated using the CKD-EPI Creatinine Equation (2021)  . Calcium 12/18/2020 8.7  8.4 - 10.5 mg/dL Final  . Hgb A1c MFr Bld 12/18/2020 7.5* 4.6 - 6.5 % Final   Glycemic Control Guidelines for People with Diabetes:Non Diabetic:  <6%Goal of  Therapy: <7%Additional Action Suggested:  >8%     Physical Examination:  BP 140/78 (BP Location: Left Arm, Patient Position: Sitting, Cuff Size: Small)   Pulse 67   Temp 98.8 F (37.1 C) (Oral)   Resp 16   Ht 6' (1.829 m)   Wt 202 lb (91.6 kg)   SpO2 96%   BMI 27.40 kg/m    He has mostly circinate slightly scaly reddish rash on his forearms  Right thyroid lobe is about twice normal, nodular, left lobe not clearly palpable  ASSESSMENT:  Diabetes type 2, on insulin  See history of present illness for detailed discussion of his current management, blood sugar patterns and problems identified  A1c 7.5 and slightly higher  Blood sugar  monitoring has been inconsistent He likely has some high readings in the mornings before breakfast as seen in the lab but today it was 140 at home He can benefit from somewhat better glucose monitoring and since his diet has been inconsistent this may be causing some high readings He is not able to afford higher doses of Farxiga   Mild hypertension: Controlled  Rash on forearms: He will review with dermatologist, likely tenia corporis  Stable multinodular goiter on exam with normal thyroid functions  PLAN:   Continue Farxiga 5 mg daily Increase Actos to 30 mg Discussed that if his fasting readings are consistently well over 150 we will consider a 40 unit pump Encouraged him to stay active consistently Consider 10 mg Farxiga but he likely will not be able to afford this More consistent glucose monitoring at different times and call if consistently high Discussed that this will enable him to adjust his boluses Consider freestyle libre on the next visit  Follow-up in 3 months     There are no Patient Instructions on file for this visit.     Elayne Snare 12/21/2020, 9:14 AM   Note: This office note was prepared with Dragon voice recognition system technology. Any transcriptional errors that result from this process are unintentional.

## 2020-12-27 ENCOUNTER — Ambulatory Visit: Payer: Medicare Other | Admitting: Occupational Therapy

## 2021-01-10 ENCOUNTER — Ambulatory Visit (INDEPENDENT_AMBULATORY_CARE_PROVIDER_SITE_OTHER): Payer: Medicare Other | Admitting: Orthopedic Surgery

## 2021-01-10 ENCOUNTER — Other Ambulatory Visit: Payer: Self-pay

## 2021-01-10 DIAGNOSIS — Z79899 Other long term (current) drug therapy: Secondary | ICD-10-CM | POA: Diagnosis not present

## 2021-01-10 DIAGNOSIS — M12811 Other specific arthropathies, not elsewhere classified, right shoulder: Secondary | ICD-10-CM | POA: Diagnosis not present

## 2021-01-10 DIAGNOSIS — L920 Granuloma annulare: Secondary | ICD-10-CM | POA: Diagnosis not present

## 2021-01-11 ENCOUNTER — Other Ambulatory Visit: Payer: Medicare Other

## 2021-01-11 DIAGNOSIS — R972 Elevated prostate specific antigen [PSA]: Secondary | ICD-10-CM | POA: Diagnosis not present

## 2021-01-12 ENCOUNTER — Encounter: Payer: Self-pay | Admitting: Orthopedic Surgery

## 2021-01-12 LAB — PSA: Prostate Specific Ag, Serum: 20.6 ng/mL — ABNORMAL HIGH (ref 0.0–4.0)

## 2021-01-12 NOTE — Progress Notes (Signed)
Office Visit Note   Patient: Alan Lopez           Date of Birth: [birth year]           MRN: [id number] Visit Date: 01/10/2021 Requested by: Janith Lima, MD [address] [place],  [place] [address] PCP: Janith Lima, MD  Subjective: Chief Complaint  Patient presents with  . Right Shoulder - Follow-up    HPI: Rush Landmark is a 75 year old active patient with right shoulder dislocation.  Had dislocation 10/01/2020.  Reports pain at night and difficulty sleeping.  He has tried physical therapy and has plateaued.  Has occasional instability.  It is hard for him to sleep at night.  Reports pain posteriorly as well.  He has been in occupational therapy and physical therapy.              ROS: All systems reviewed are negative as they relate to the chief complaint within the history of present illness.  Patient denies  fevers or chills.   Assessment & Plan: Visit Diagnoses:  1. Rotator cuff arthropathy of right shoulder     Plan: Impression is right shoulder pain and disability from pseudoparalysis and rotator cuff arthropathy following shoulder dislocation.  We discussed operative and nonoperative treatment options.  Discussed the expected outcome for reverse shoulder replacement which would be less pain and moderately improved function in the shoulder.  Plan at this time is thin cut CT scan for patient specific instrumentation.  The rationale for that with the use of models is used to discuss reverse replacement with Bill.  All questions answered.  Plan to see him back after the scan and we can figure out a date for surgery.  Follow-Up Instructions: Return for after MRI.   Orders:  Orders Placed This Encounter  Procedures  . CT SHOULDER RIGHT WO CONTRAST   No orders of the defined types were placed in this encounter.     Procedures: No procedures performed   Clinical Data: No additional findings.  Objective: Vital Signs: There were no vitals taken for this  visit.  Physical Exam:   Constitutional: Patient appears well-developed HEENT:  Head: Normocephalic Eyes:EOM are normal Neck: Normal range of motion Cardiovascular: Normal rate Pulmonary/chest: Effort normal Neurologic: Patient is alert Skin: Skin is warm Psychiatric: Patient has normal mood and affect    Ortho Exam: Ortho exam demonstrates full active and passive range of motion of the cervical spine.  Right shoulder has about 70 degrees of active forward flexion and weakness to infraspinatus and supraspinatus testing.  Deltoid is functional.  Passive range of motion is easily achievable to 170 forward flexion and 120 of abduction on the right.  Skin is intact in the shoulder girdle region  Specialty Comments:  No specialty comments available.  Imaging: No results found.   PMFS History: Patient Active Problem List   Diagnosis Date Noted  . Tear of right supraspinatus tendon 10/18/2020  . Elevated PSA 07/17/2020  . Exocrine pancreatic insufficiency 06/15/2020  . Essential hypertension 04/13/2019  . 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  . Benign prostatic hyperplasia with urinary obstruction 01/29/2017  . Gynecomastia, male 01/29/2017  . Type II diabetes mellitus with complication ([facility]) 28/41/3244  . S/P PTCA (percutaneous transluminal coronary angioplasty) 12/29/2015  . NSTEMI (non-ST elevated myocardial infarction) ([facility]) 12/29/2015  . Coronary artery disease due to lipid rich plaque 12/29/2015  . Hyperlipidemia LDL goal <70 12/29/2015  . Multinodular goiter 04/12/2015  . Blindness of left eye 04/12/2015  Past Medical History:  Diagnosis Date  . 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 HTN (hypertension)   . Hypertrophy (benign) of prostate   . Multinodular goiter 04/12/2015  . Non-insulin dependent type 2 diabetes mellitus ([facility])   . Status post non-ST elevation myocardial infarction (NSTEMI)    CATH  . Tobacco abuse   . Type II diabetes mellitus,  uncontrolled ([facility]) 04/12/2015    Family History  Problem Relation Age of Onset  . Diabetes Mother   . Diabetes Brother   . Cancer Brother   . Heart disease Neg Hx     Past Surgical History:  Procedure Laterality Date  . CORONARY ANGIOPLASTY WITH STENT PLACEMENT     Social History   Occupational History  . Occupation: retired  Tobacco Use  . Smoking status: Light Tobacco Smoker    Types: Cigarettes  . Smokeless tobacco: Never Used  . Tobacco comment: smokes on social occassions  Vaping Use  . Vaping Use: Never used  Substance and Sexual Activity  . Alcohol use: Yes    Alcohol/week: 10.0 standard drinks    Types: 10 Cans of beer per week    Comment: socially  . Drug use: No  . Sexual activity: Never

## 2021-01-15 ENCOUNTER — Other Ambulatory Visit: Payer: Self-pay

## 2021-01-15 ENCOUNTER — Ambulatory Visit: Payer: Medicare Other | Admitting: Surgical

## 2021-01-15 MED ORDER — ROSUVASTATIN CALCIUM 10 MG PO TABS
10.0000 mg | ORAL_TABLET | Freq: Every day | ORAL | 3 refills | Status: DC
Start: 2021-01-15 — End: 2022-01-07

## 2021-01-17 ENCOUNTER — Ambulatory Visit (INDEPENDENT_AMBULATORY_CARE_PROVIDER_SITE_OTHER): Payer: Medicare Other | Admitting: Urology

## 2021-01-17 ENCOUNTER — Encounter: Payer: Self-pay | Admitting: Urology

## 2021-01-17 ENCOUNTER — Ambulatory Visit: Payer: Medicare Other | Admitting: Urology

## 2021-01-17 ENCOUNTER — Other Ambulatory Visit: Payer: Self-pay

## 2021-01-17 VITALS — BP 114/67 | HR 65 | Temp 98.3°F | Ht 72.0 in | Wt 204.0 lb

## 2021-01-17 DIAGNOSIS — N138 Other obstructive and reflux uropathy: Secondary | ICD-10-CM

## 2021-01-17 DIAGNOSIS — R351 Nocturia: Secondary | ICD-10-CM | POA: Diagnosis not present

## 2021-01-17 DIAGNOSIS — N401 Enlarged prostate with lower urinary tract symptoms: Secondary | ICD-10-CM | POA: Diagnosis not present

## 2021-01-17 DIAGNOSIS — R972 Elevated prostate specific antigen [PSA]: Secondary | ICD-10-CM | POA: Diagnosis not present

## 2021-01-17 LAB — URINALYSIS, ROUTINE W REFLEX MICROSCOPIC
Bilirubin, UA: NEGATIVE
Ketones, UA: NEGATIVE
Leukocytes,UA: NEGATIVE
Nitrite, UA: NEGATIVE
Protein,UA: NEGATIVE
Specific Gravity, UA: 1.01 (ref 1.005–1.030)
Urobilinogen, Ur: 0.2 mg/dL (ref 0.2–1.0)
pH, UA: 5 (ref 5.0–7.5)

## 2021-01-17 LAB — MICROSCOPIC EXAMINATION
Bacteria, UA: NONE SEEN
Renal Epithel, UA: NONE SEEN /hpf

## 2021-01-17 MED ORDER — TADALAFIL 20 MG PO TABS: 20.0000 mg | ORAL_TABLET | Freq: Every day | ORAL | 0 refills | Status: DC | PRN

## 2021-01-17 MED ORDER — ALFUZOSIN HCL ER 10 MG PO TB24: 10.0000 mg | ORAL_TABLET | Freq: Every day | ORAL | 3 refills | Status: DC

## 2021-01-17 MED ORDER — DUTASTERIDE 0.5 MG PO CAPS: 0.5000 mg | ORAL_CAPSULE | Freq: Every day | ORAL | 0 refills | Status: DC

## 2021-01-17 MED ORDER — FINASTERIDE 5 MG PO TABS
5.0000 mg | ORAL_TABLET | Freq: Every day | ORAL | 3 refills | Status: DC
Start: 2021-01-17 — End: 2021-07-18

## 2021-01-17 NOTE — Progress Notes (Signed)
Urological Symptom Review  Patient is experiencing the following symptoms: Frequent urination Get up at night to urinate   Review of Systems  Gastrointestinal (upper)  : Negative for upper GI symptoms  Gastrointestinal (lower) : Negative for lower GI symptoms  Constitutional : Negative for symptoms  Skin: Skin rash  Eyes: Negative for eye symptoms  Ear/Nose/Throat : Negative for Ear/Nose/Throat symptoms  Hematologic/Lymphatic: Negative for Hematologic/Lymphatic symptoms  Cardiovascular : Negative for cardiovascular symptoms  Respiratory : Negative for respiratory symptoms  Endocrine: Negative for endocrine symptoms  Musculoskeletal: Negative for musculoskeletal symptoms  Neurological: Negative for neurological symptoms  Psychologic: Negative for psychiatric symptoms

## 2021-01-17 NOTE — Progress Notes (Signed)
01/17/2021 9:30 AM   Alan Lopez 1946/06/30 [id number]  Referring provider: Janith Lima, MD [address] [place],  [place] [address]  followup elevated PSA  HPI: Mr Montminy is a 75yo here for followup for elevated PSA. PSA increased to 20.6 from 10. He has stable moderate LUTS on uroxatral and finasteride. Nocturia stable at 2-3x. He stopped his finasteride 2 months ago. No other complaints today. He uses cialis 8m for ED which works well for him.    PMH: Past Medical History:  Diagnosis Date  . 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 HTN (hypertension)   . Hypertrophy (benign) of prostate   . Multinodular goiter 04/12/2015  . Non-insulin dependent type 2 diabetes mellitus ([facility]   . Status post non-ST elevation myocardial infarction (NSTEMI)    CATH  . Tobacco abuse   . Type II diabetes mellitus, uncontrolled ([facility] 04/12/2015    Surgical History: Past Surgical History:  Procedure Laterality Date  . CORONARY ANGIOPLASTY WITH STENT PLACEMENT      Home Medications:  Allergies as of 01/17/2021      Reactions   Atorvastatin Other (See Comments)   Codeine Other (See Comments)   Patient doesn't like how it makes him feel, "spaced out, not together"      Medication List       Accurate as of January 17, 2021  9:30 AM. If you have any questions, ask your nurse or doctor.        alfuzosin 10 MG 24 hr tablet Commonly known as: UROXATRAL Take 1 tablet (10 mg total) by mouth daily with breakfast.   aspirin EC 81 MG tablet Take 81 mg by mouth daily.   cholecalciferol 1000 units tablet Commonly known as: VITAMIN D Take 1,000 Units by mouth daily.   dapagliflozin propanediol 10 MG Tabs tablet Commonly known as: Farxiga Take 2 tablets (20 mg total) by mouth daily. What changed: how much to take   finasteride 5 MG tablet Commonly known as: PROSCAR Take 1 tablet (5 mg total) by mouth daily.   hydroxychloroquine 200 MG tablet Commonly known as: PLAQUENIL Take  200 mg by mouth 2 (two) times daily.   Insulin Pen Needle 32G X 4 MM Misc Use Three per day to inject insulin   insulin regular 100 units/mL injection Commonly known as: NOVOLIN R Inject into the skin 3 (three) times daily before meals. USE INSULIN TO FILL UP v-GO PUMP   losartan 25 MG tablet Commonly known as: COZAAR Take 1 tablet (25 mg total) by mouth daily.   meloxicam 7.5 MG tablet Commonly known as: MOBIC Take 1 tablet (7.5 mg total) by mouth daily.   pioglitazone 15 MG tablet Commonly known as: ACTOS TAKE ONE TABLET BY MOUTH DAILY   rosuvastatin 10 MG tablet Commonly known as: Crestor Take 1 tablet (10 mg total) by mouth at bedtime.   tadalafil 20 MG tablet Commonly known as: CIALIS Take 1 tablet (20 mg total) by mouth daily as needed for erectile dysfunction.   V-Go 30 Kit APPLY AS INSTRUCTED       Allergies:  Allergies  Allergen Reactions  . Atorvastatin Other (See Comments)  . Codeine Other (See Comments)    Patient doesn't like how it makes him feel, "spaced out, not together"    Family History: Family History  Problem Relation Age of Onset  . Diabetes Mother   . Diabetes Brother   . Cancer Brother   . Heart disease Neg Hx  Social History:  reports that he has been smoking cigarettes. He has never used smokeless tobacco. He reports current alcohol use of about 10.0 standard drinks of alcohol per week. He reports that he does not use drugs.  ROS: All other review of systems were reviewed and are negative except what is noted above in HPI  Physical Exam: BP 114/67   Pulse 65   Temp 98.3 F (36.8 C)   Ht 6' (1.829 m)   Wt 204 lb (92.5 kg)   BMI 27.67 kg/m   Constitutional:  Alert and oriented, No acute distress. HEENT: [place] AT, moist mucus membranes.  Trachea midline, no masses. Cardiovascular: No clubbing, cyanosis, or edema. Respiratory: Normal respiratory effort, no increased work of breathing. GI: Abdomen is soft, nontender,  nondistended, no abdominal masses GU: No CVA tenderness.  Lymph: No cervical or inguinal lymphadenopathy. Skin: No rashes, bruises or suspicious lesions. Neurologic: Grossly intact, no focal deficits, moving all 4 extremities. Psychiatric: Normal mood and affect.  Laboratory Data: Lab Results  Component Value Date   WBC 8.3 04/13/2019   HGB 14.9 04/13/2019   HCT 42.8 04/13/2019   MCV 94.9 04/13/2019   PLT 181.0 04/13/2019    Lab Results  Component Value Date   CREATININE 0.92 12/18/2020    No results found for: PSA  No results found for: TESTOSTERONE  Lab Results  Component Value Date   HGBA1C 7.5 (H) 12/18/2020    Urinalysis    Component Value Date/Time   COLORURINE YELLOW 01/29/2017 1024   APPEARANCEUR Clear 07/17/2020 0857   LABSPEC 1.020 01/29/2017 1024   PHURINE 6.0 01/29/2017 1024   GLUCOSEU 3+ (A) 07/17/2020 0857   GLUCOSEU NEGATIVE 01/29/2017 1024   HGBUR NEGATIVE 01/29/2017 1024   BILIRUBINUR Negative 07/17/2020 0857   KETONESUR NEGATIVE 01/29/2017 1024   PROTEINUR Negative 07/17/2020 0857   UROBILINOGEN 0.2 01/29/2017 1024   NITRITE Negative 07/17/2020 0857   NITRITE NEGATIVE 01/29/2017 1024   LEUKOCYTESUR Negative 07/17/2020 0857    Lab Results  Component Value Date   LABMICR Comment 07/17/2020    Pertinent Imaging:  No results found for this or any previous visit.  No results found for this or any previous visit.  No results found for this or any previous visit.  No results found for this or any previous visit.  No results found for this or any previous visit.  No results found for this or any previous visit.  No results found for this or any previous visit.  No results found for this or any previous visit.   Assessment & Plan:    1. Elevated PSA -Restart finasteride. RTC 6 months with PSA - Urinalysis, Routine w reflex microscopic  2. BPH with LUTS, nocturia -Continue uroxatral 53m and restart finasteride 543m   No  follow-ups on file.  PaNicolette BangMD  [facility] [place]

## 2021-01-17 NOTE — Patient Instructions (Signed)
  Prostate-Specific Antigen Test Why am I having this test? The prostate-specific antigen (PSA) test is a screening test for prostate cancer. It can identify early signs of prostate cancer, which may allow for more effective treatment. Your health care provider may recommend that you have a PSA test starting at age 76 or that you have one earlier or later, depending on your risk factors for prostate cancer. You may also have a PSA test:  To monitor treatment of prostate cancer.  To check whether prostate cancer has returned after treatment.  If you have signs of other conditions that can affect PSA levels, such as: ? An enlarged prostate that is not caused by cancer (benign prostatic hyperplasia, BPH). This condition is very common in older men. ? A prostate infection. What is being tested? This test measures the amount of PSA in your blood. PSA is a protein that is made in the prostate. The prostate naturally produces more PSA as you age, but very high levels may be a sign of a medical condition. What kind of sample is taken? A blood sample is required for this test. It is usually collected by inserting a needle into a blood vessel or by sticking a finger with a small needle. Blood for this test should be drawn before having an exam of the prostate.   How do I prepare for this test? Do not ejaculate starting 24 hours before your test, or as long as told by your health care provider. Tell a health care provider about:  Any allergies you have.  All medicines you are taking, including vitamins, herbs, eye drops, creams, and over-the-counter medicines. This also includes: ? Medicines to assist with hair growth, such as finasteride. ? Any recent exposure to a medicine called diethylstilbestrol.  Any blood disorders you have.  Any recent procedures you have had, especially any procedures involving the prostate or rectum.  Any medical conditions you have.  Any recent urinary tract  infections (UTIs) you have had. How are the results reported? Your test results will be reported as a value that indicates how much PSA is in your blood. This will be given as nanograms of PSA per milliliter of blood (ng/mL). Your health care provider will compare your results to normal ranges that were established after testing a large group of people (reference ranges). Reference ranges may vary among labs and hospitals. PSA levels vary from person to person and generally increase with age. Because of this variation, there is no single PSA value that is considered normal for everyone. Instead, PSA reference ranges are used to describe whether your PSA levels are considered low or high (elevated). Common reference ranges are:  Low: 0-2.5 ng/mL.  Slightly to moderately elevated: 2.6-10.0 ng/mL.  Moderately elevated: 10.0-19.9 ng/mL.  Significantly elevated: 20 ng/mL or greater. Sometimes, the test results may report that a condition is present when it is not present (false-positive result). What do the results mean? A test result that is higher than 4 ng/mL may mean that you are at an increased risk for prostate cancer. However, a PSA test by itself is not enough to diagnose prostate cancer. High PSA levels may also be caused by the natural aging process, prostate infection, or BPH. PSA screening cannot tell you if your PSA is high due to cancer or a different cause. A prostate biopsy is the only way to diagnose prostate cancer. A risk of having the PSA test is diagnosing and treating prostate cancer that would never   have caused any symptoms or problems (overdiagnosis and overtreatment). Talk with your health care provider about what your results mean. Questions to ask your health care provider Ask your health care provider, or the department that is doing the test:  When will my results be ready?  How will I get my results?  What are my treatment options?  What other tests do I  need?  What are my next steps? Summary  The prostate-specific antigen (PSA) test is a screening test for prostate cancer.  Your health care provider may recommend that you have a PSA test starting at age 76 or that you have one earlier or later, depending on your risk factors for prostate cancer.  A test result that is higher than 4 ng/mL may mean that you are at an increased risk for prostate cancer. However, elevated levels can be caused by a number of conditions other than prostate cancer.  Talk with your health care provider about what your results mean. This information is not intended to replace advice given to you by your health care provider. Make sure you discuss any questions you have with your health care provider. Document Revised: 08/10/2020 Document Reviewed: 08/10/2020 Elsevier Patient Education  2021 Elsevier Inc.  

## 2021-01-23 ENCOUNTER — Telehealth: Payer: Self-pay | Admitting: Orthopedic Surgery

## 2021-01-23 NOTE — Telephone Encounter (Signed)
Patient called stating he would like to cancel his CT Scan scheduled for 01-31-21 because he is not quite ready to have shoulder surgery.  He will call in the future if it gets worse or he changes his mind.     cb  [phone]

## 2021-01-23 NOTE — Telephone Encounter (Signed)
See below

## 2021-01-24 NOTE — Telephone Encounter (Signed)
Ok thx.

## 2021-01-24 NOTE — Telephone Encounter (Signed)
Order for CT scan has been cancelled and closed out

## 2021-01-31 ENCOUNTER — Other Ambulatory Visit: Payer: Medicare Other

## 2021-02-07 DIAGNOSIS — Z79899 Other long term (current) drug therapy: Secondary | ICD-10-CM | POA: Diagnosis not present

## 2021-02-07 DIAGNOSIS — L920 Granuloma annulare: Secondary | ICD-10-CM | POA: Diagnosis not present

## 2021-02-16 ENCOUNTER — Telehealth: Payer: Self-pay | Admitting: Endocrinology

## 2021-02-16 NOTE — Telephone Encounter (Signed)
MEDICATION:  dapagliflozin propanediol (FARXIGA) 10 MG TABS tablet  PHARMACY:   [id number] [place], [place] Phone:  [phone]  Fax:  [phone]      HAS THE PATIENT CONTACTED THEIR PHARMACY?   Yes-PHARM unable to fill  IS THIS A 90 DAY SUPPLY :  No  IS PATIENT OUT OF MEDICATION:  No  IF NOT; HOW MUCH IS LEFT: Approx. 3 days  LAST APPOINTMENT DATE: @1 /13/2022  NEXT APPOINTMENT DATE:@4 /18/2022  DO WE HAVE YOUR PERMISSION TO LEAVE A DETAILED MESSAGE?: Yes  OTHER COMMENTS:    **Let patient know to contact pharmacy at the end of the day to make sure medication is ready. **  ** Please notify patient to allow 48-72 hours to process**  **Encourage patient to contact the pharmacy for refills or they can request refills through [facility]**

## 2021-02-18 ENCOUNTER — Other Ambulatory Visit: Payer: Self-pay | Admitting: Urology

## 2021-02-21 ENCOUNTER — Other Ambulatory Visit: Payer: Self-pay | Admitting: *Deleted

## 2021-02-21 MED ORDER — DAPAGLIFLOZIN PROPANEDIOL 10 MG PO TABS: 20.0000 mg | ORAL_TABLET | Freq: Every day | ORAL | 1 refills | Status: DC

## 2021-02-21 NOTE — Telephone Encounter (Signed)
Rx farxiga sent to the [facility].

## 2021-02-27 DIAGNOSIS — E119 Type 2 diabetes mellitus without complications: Secondary | ICD-10-CM | POA: Diagnosis not present

## 2021-02-27 DIAGNOSIS — H524 Presbyopia: Secondary | ICD-10-CM | POA: Diagnosis not present

## 2021-02-27 DIAGNOSIS — H5201 Hypermetropia, right eye: Secondary | ICD-10-CM | POA: Diagnosis not present

## 2021-02-27 DIAGNOSIS — H2522 Age-related cataract, morgagnian type, left eye: Secondary | ICD-10-CM | POA: Diagnosis not present

## 2021-02-27 LAB — HM DIABETES EYE EXAM

## 2021-03-12 DIAGNOSIS — Z79899 Other long term (current) drug therapy: Secondary | ICD-10-CM | POA: Diagnosis not present

## 2021-03-25 ENCOUNTER — Other Ambulatory Visit: Payer: Self-pay | Admitting: Urology

## 2021-03-26 ENCOUNTER — Other Ambulatory Visit: Payer: Self-pay

## 2021-03-26 ENCOUNTER — Other Ambulatory Visit: Payer: Medicare Other

## 2021-03-26 ENCOUNTER — Other Ambulatory Visit: Payer: Self-pay | Admitting: *Deleted

## 2021-03-26 ENCOUNTER — Other Ambulatory Visit (INDEPENDENT_AMBULATORY_CARE_PROVIDER_SITE_OTHER): Payer: Medicare Other

## 2021-03-26 DIAGNOSIS — E7800 Pure hypercholesterolemia, unspecified: Secondary | ICD-10-CM | POA: Diagnosis not present

## 2021-03-26 DIAGNOSIS — E1165 Type 2 diabetes mellitus with hyperglycemia: Secondary | ICD-10-CM | POA: Diagnosis not present

## 2021-03-26 DIAGNOSIS — Z794 Long term (current) use of insulin: Secondary | ICD-10-CM | POA: Diagnosis not present

## 2021-03-26 LAB — COMPREHENSIVE METABOLIC PANEL
ALT: 56 U/L — ABNORMAL HIGH (ref 0–53)
AST: 36 U/L (ref 0–37)
Albumin: 3.7 g/dL (ref 3.5–5.2)
Alkaline Phosphatase: 51 U/L (ref 39–117)
BUN: 23 mg/dL (ref 6–23)
CO2: 23 mEq/L (ref 19–32)
Calcium: 8.9 mg/dL (ref 8.4–10.5)
Chloride: 107 mEq/L (ref 96–112)
Creatinine, Ser: 0.91 mg/dL (ref 0.40–1.50)
GFR: 82.09 mL/min (ref >60.00)
Glucose, Bld: 123 mg/dL — ABNORMAL HIGH (ref 70–99)
Potassium: 4 mEq/L (ref 3.5–5.1)
Sodium: 137 mEq/L (ref 135–145)
Total Bilirubin: 0.7 mg/dL (ref 0.2–1.2)
Total Protein: 6.5 g/dL (ref 6.0–8.3)

## 2021-03-26 LAB — LIPID PANEL
Cholesterol: 102 mg/dL (ref 0–200)
HDL: 60.7 mg/dL (ref >39.00)
LDL Cholesterol: 30 mg/dL (ref 0–99)
NonHDL: 41.51
Total CHOL/HDL Ratio: 2
Triglycerides: 57 mg/dL (ref 0.0–149.0)
VLDL: 11.4 mg/dL (ref 0.0–40.0)

## 2021-03-26 LAB — HEMOGLOBIN A1C: Hgb A1c MFr Bld: 7.1 % — ABNORMAL HIGH (ref 4.6–6.5)

## 2021-03-26 MED ORDER — EMPAGLIFLOZIN 25 MG PO TABS: ORAL_TABLET | ORAL | 1 refills | Status: DC

## 2021-03-28 ENCOUNTER — Other Ambulatory Visit: Payer: Self-pay

## 2021-03-28 ENCOUNTER — Encounter: Payer: Self-pay | Admitting: Endocrinology

## 2021-03-28 ENCOUNTER — Ambulatory Visit (INDEPENDENT_AMBULATORY_CARE_PROVIDER_SITE_OTHER): Payer: Medicare Other | Admitting: Endocrinology

## 2021-03-28 VITALS — BP 138/80 | HR 63 | Ht 72.0 in | Wt 204.2 lb

## 2021-03-28 DIAGNOSIS — E1165 Type 2 diabetes mellitus with hyperglycemia: Secondary | ICD-10-CM | POA: Diagnosis not present

## 2021-03-28 DIAGNOSIS — Z794 Long term (current) use of insulin: Secondary | ICD-10-CM | POA: Diagnosis not present

## 2021-03-28 DIAGNOSIS — E7800 Pure hypercholesterolemia, unspecified: Secondary | ICD-10-CM | POA: Diagnosis not present

## 2021-03-28 DIAGNOSIS — R748 Abnormal levels of other serum enzymes: Secondary | ICD-10-CM | POA: Diagnosis not present

## 2021-03-28 NOTE — Progress Notes (Signed)
Patient ID: Alan Lopez, male   DOB: 1945-12-15, 75 y.o.   MRN: [id number]            Reason for Appointment: Follow-up for Type 2 Diabetes   History of Present Illness:          Date of diagnosis of type 2 diabetes mellitus :  1999      Past history:  He had modestly increase blood sugars at diagnosis and was started on metformin Subsequently glipizide was added to improve his control. He thinks that his blood sugars have been mostly poorly controlled over the last several years He had been on glipizide and metformin for years without any adjustment of his dosage of glipizide  Review of his A1c shows that it had ranged from 9-9.5 since 07/2013 Previously his PCP tried him on a sample of Jardiance 10 mg without significant improvement  He was started on the V-go pump on 07/02/17 because of inconsistent control and poor compliance with mealtime insulin  Recent history:   Oral hypoglycemic drugs the patient is taking are: Actos 15 mg, Farxiga 5 mg daily  Current INSULIN regimen: V-go pump SETTINGS: Basal rate = 30 units BOLUSES = 3-4 units at breakfast, 8 at lunch and  6-10 U at dinner  A1c is 7.1 compared to 7.5  Fructosamine previously 225  Current blood sugar patterns and problems identified:  He did not bring his monitor for download although previously he has checked blood sugars only sporadically  However he thinks his fasting readings are relatively better than before lab glucose 123  Overall he thinks that he is trying to do better with healthier meals and likely less carbohydrate  General he is more active with outside activities compared to the wintertime  Has had only rare episodes of hypoglycemia overnight when he has glucose tablets  His insurance is now wanting to change his [place] to [place] but he has not started this yet  Weight is about the same  He was supposed to increase Actos to 30 mg on last visit but still taking 15 mg  Side effects from  medications have been: Mild  diarrhea from regular metformin  Compliance with the medical regimen: fair      Glucose monitoring:  done less than once a day usually       Glucometer:   Contour   Blood sugars by recall  Fasting: 99-122 PC <200 160s  Previously:  PRE-MEAL Fasting Lunch Dinner Bedtime Overall  Glucose range:  140    138   Mean/median:      68   POST-MEAL PC Breakfast PC Lunch PC Dinner  Glucose range:  138-200,    Mean/median:          Self-care: The diet that the patient has been following is: tries to limit high carbohydrate foods. eating out about 4 times a week     Typical meal intake: Breakfast is  Toast/meat .  Usually 2 servings of vegetables in the evening and avoiding potatoes and bread usually Usually avoiding drinks with sugar                Dietician visit, most recent: 05/2015  CDE visit: 01/2016  Weight history: Maximum weight 215 previously  Wt Readings from Last 3 Encounters:  03/28/21 204 lb 3.2 oz (92.6 kg)  01/17/21 204 lb (92.5 kg)  12/21/20 202 lb (91.6 kg)    Glycemic control:    Lab Results  Component Value Date   HGBA1C  7.1 (H) 03/26/2021   HGBA1C 7.5 (H) 12/18/2020   HGBA1C 7.2 (H) 08/08/2020   Lab Results  Component Value Date   MICROALBUR <0.7 12/18/2020   LDLCALC 30 03/26/2021   CREATININE 0.91 03/26/2021    Lab on 03/26/2021  Component Date Value Ref Range Status  . Cholesterol 03/26/2021 102  0 - 200 mg/dL Final   ATP III Classification       Desirable:  < 200 mg/dL               Borderline High:  200 - 239 mg/dL          High:  > = 240 mg/dL  . Triglycerides 03/26/2021 57.0  0.0 - 149.0 mg/dL Final   Normal:  <150 mg/dLBorderline High:  150 - 199 mg/dL  . HDL 03/26/2021 60.70  >39.00 mg/dL Final  . VLDL 03/26/2021 11.4  0.0 - 40.0 mg/dL Final  . LDL Cholesterol 03/26/2021 30  0 - 99 mg/dL Final  . Total CHOL/HDL Ratio 03/26/2021 2   Final                  Men          Women1/2 Average Risk     3.4           3.3Average Risk          5.0          4.42X Average Risk          9.6          7.13X Average Risk          15.0          11.0                      . NonHDL 03/26/2021 41.51   Final   NOTE:  Non-HDL goal should be 30 mg/dL higher than patient's LDL goal (i.e. LDL goal of < 70 mg/dL, would have non-HDL goal of < 100 mg/dL)  . Sodium 03/26/2021 137  135 - 145 mEq/L Final  . Potassium 03/26/2021 4.0  3.5 - 5.1 mEq/L Final  . Chloride 03/26/2021 107  96 - 112 mEq/L Final  . CO2 03/26/2021 23  19 - 32 mEq/L Final  . Glucose, Bld 03/26/2021 123* 70 - 99 mg/dL Final  . BUN 03/26/2021 23  6 - 23 mg/dL Final  . Creatinine, Ser 03/26/2021 0.91  0.40 - 1.50 mg/dL Final  . Total Bilirubin 03/26/2021 0.7  0.2 - 1.2 mg/dL Final  . Alkaline Phosphatase 03/26/2021 51  39 - 117 U/L Final  . AST 03/26/2021 36  0 - 37 U/L Final  . ALT 03/26/2021 56* 0 - 53 U/L Final  . Total Protein 03/26/2021 6.5  6.0 - 8.3 g/dL Final  . Albumin 03/26/2021 3.7  3.5 - 5.2 g/dL Final  . GFR 03/26/2021 82.09  >60.00 mL/min Final   Calculated using the CKD-EPI Creatinine Equation (2021)  . Calcium 03/26/2021 8.9  8.4 - 10.5 mg/dL Final  . Hgb A1c MFr Bld 03/26/2021 7.1* 4.6 - 6.5 % Final   Glycemic Control Guidelines for People with Diabetes:Non Diabetic:  <6%Goal of Therapy: <7%Additional Action Suggested:  >8%       Allergies as of 03/28/2021      Reactions   Atorvastatin Other (See Comments)   Codeine Other (See Comments)   Patient doesn't like how it makes him feel, "spaced out, not together"  Medication List       Accurate as of March 28, 2021  9:33 AM. If you have any questions, ask your nurse or doctor.        alfuzosin 10 MG 24 hr tablet Commonly known as: UROXATRAL Take 1 tablet (10 mg total) by mouth daily with breakfast.   aspirin EC 81 MG tablet Take 81 mg by mouth daily.   cholecalciferol 1000 units tablet Commonly known as: VITAMIN D Take 1,000 Units by mouth daily.   dapagliflozin  propanediol 10 MG Tabs tablet Commonly known as: Farxiga Take 2 tablets (20 mg total) by mouth daily.   dutasteride 0.5 MG capsule Commonly known as: AVODART TAKE ONE CAPSULE BY MOUTH DAILY   empagliflozin 25 MG Tabs tablet Commonly known as: Jardiance Take 1 tablet daily ( to replace [place] per insurance request)   finasteride 5 MG tablet Commonly known as: PROSCAR Take 1 tablet (5 mg total) by mouth daily.   hydroxychloroquine 200 MG tablet Commonly known as: PLAQUENIL Take 200 mg by mouth 2 (two) times daily.   Insulin Pen Needle 32G X 4 MM Misc Use Three per day to inject insulin   insulin regular 100 units/mL injection Commonly known as: NOVOLIN R Inject into the skin 3 (three) times daily before meals. USE INSULIN TO FILL UP v-GO PUMP   losartan 25 MG tablet Commonly known as: COZAAR Take 1 tablet (25 mg total) by mouth daily.   meloxicam 7.5 MG tablet Commonly known as: MOBIC Take 1 tablet (7.5 mg total) by mouth daily.   pioglitazone 15 MG tablet Commonly known as: ACTOS TAKE ONE TABLET BY MOUTH DAILY   rosuvastatin 10 MG tablet Commonly known as: Crestor Take 1 tablet (10 mg total) by mouth at bedtime.   tadalafil 20 MG tablet Commonly known as: CIALIS TAKE ONE TABLET BY MOUTH DAILY AS NEEDED FOR FOR ERECTILE DYSFUNCTION   V-Go 30 Kit APPLY AS INSTRUCTED       Allergies:  Allergies  Allergen Reactions  . Atorvastatin Other (See Comments)  . Codeine Other (See Comments)    Patient doesn't like how it makes him feel, "spaced out, not together"    Past Medical History:  Diagnosis Date  . 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 HTN (hypertension)   . Hypertrophy (benign) of prostate   . Multinodular goiter 04/12/2015  . Non-insulin dependent type 2 diabetes mellitus ([place])   . Status post non-ST elevation myocardial infarction (NSTEMI)    CATH  . Tobacco abuse   . Type II diabetes mellitus, uncontrolled ([place]) 04/12/2015    Past Surgical History:   Procedure Laterality Date  . CORONARY ANGIOPLASTY WITH STENT PLACEMENT      Family History  Problem Relation Age of Onset  . Diabetes Mother   . Diabetes Brother   . Cancer Brother   . Heart disease Neg Hx     Social History:  reports that he has been smoking cigarettes. He has never used smokeless tobacco. He reports current alcohol use of about 10.0 standard drinks of alcohol per week. He reports that he does not use drugs.    Review of Systems     Lipid history:   Has history of CAD and myocardial infarction  He is on 10 mg Crestor prescribed by his cardiologist since 09/2018  LDL is below 70 but has not had any recent labs  Labs as follows:   Lab Results  Component Value Date   CHOL 102 03/26/2021  HDL 60.70 03/26/2021   LDLCALC 30 03/26/2021   TRIG 57.0 03/26/2021   CHOLHDL 2 03/26/2021           Eyes: He has had blindness in his left eye from old injury   His last eye exam was in 08/2020  THYROID: Previously was found to have a 2 cm nodule on his left thyroid on his exam but ultrasound in 04/2015 showed multinodular goiter with mostly small nodules and the largest nodule on the left side of 1.4 cm has the appearance of a pseudo- nodule  Thyroid levels have been normal  Lab Results  Component Value Date   TSH 1.80 12/18/2020    NEUROPATHY: Has feeling of numbness on the soles of his feet which is not present consistently Also may feel a sensation of numbness when his foot is on the car accelerator pedal He has no numbness in the toes No burning or tingling   LABS:  Lab on 03/26/2021  Component Date Value Ref Range Status  . Cholesterol 03/26/2021 102  0 - 200 mg/dL Final   ATP III Classification       Desirable:  < 200 mg/dL               Borderline High:  200 - 239 mg/dL          High:  > = 240 mg/dL  . Triglycerides 03/26/2021 57.0  0.0 - 149.0 mg/dL Final   Normal:  <150 mg/dLBorderline High:  150 - 199 mg/dL  . HDL 03/26/2021 60.70  >39.00  mg/dL Final  . VLDL 03/26/2021 11.4  0.0 - 40.0 mg/dL Final  . LDL Cholesterol 03/26/2021 30  0 - 99 mg/dL Final  . Total CHOL/HDL Ratio 03/26/2021 2   Final                  Men          Women1/2 Average Risk     3.4          3.3Average Risk          5.0          4.42X Average Risk          9.6          7.13X Average Risk          15.0          11.0                      . NonHDL 03/26/2021 41.51   Final   NOTE:  Non-HDL goal should be 30 mg/dL higher than patient's LDL goal (i.e. LDL goal of < 70 mg/dL, would have non-HDL goal of < 100 mg/dL)  . Sodium 03/26/2021 137  135 - 145 mEq/L Final  . Potassium 03/26/2021 4.0  3.5 - 5.1 mEq/L Final  . Chloride 03/26/2021 107  96 - 112 mEq/L Final  . CO2 03/26/2021 23  19 - 32 mEq/L Final  . Glucose, Bld 03/26/2021 123* 70 - 99 mg/dL Final  . BUN 03/26/2021 23  6 - 23 mg/dL Final  . Creatinine, Ser 03/26/2021 0.91  0.40 - 1.50 mg/dL Final  . Total Bilirubin 03/26/2021 0.7  0.2 - 1.2 mg/dL Final  . Alkaline Phosphatase 03/26/2021 51  39 - 117 U/L Final  . AST 03/26/2021 36  0 - 37 U/L Final  . ALT 03/26/2021 56* 0 - 53 U/L Final  . Total Protein 03/26/2021 6.5  6.0 - 8.3 g/dL Final  . Albumin 03/26/2021 3.7  3.5 - 5.2 g/dL Final  . GFR 03/26/2021 82.09  >60.00 mL/min Final   Calculated using the CKD-EPI Creatinine Equation (2021)  . Calcium 03/26/2021 8.9  8.4 - 10.5 mg/dL Final  . Hgb A1c MFr Bld 03/26/2021 7.1* 4.6 - 6.5 % Final   Glycemic Control Guidelines for People with Diabetes:Non Diabetic:  <6%Goal of Therapy: <7%Additional Action Suggested:  >8%     Physical Examination:  BP 138/80   Pulse 63   Ht 6' (1.829 m)   Wt 204 lb 3.2 oz (92.6 kg)   SpO2 98%   BMI 27.69 kg/m      ASSESSMENT:  Diabetes type 2, on insulin  See history of present illness for detailed discussion of his current management, blood sugar patterns and problems identified  A1c 7.1 compared to 7.5 and slightly better  Unable to review his home blood sugars  but he reports better readings overall Recently starting to do better with diet and activity level Also not hypoglycemic with current basal rate on his V-go pump  Mild hypertension: Controlled  Mild increase in liver function: Possibly from Plaquenil being prescribed by dermatologist, he has been on Crestor long-term Labs forwarded  Lipids: Well-controlled and still on 10 mg rosuvastatin as per cardiologist  PLAN:   Continue 15 mg Actos for now His Jardiance can be still continued at half tablet of the 25 mg daily for economy He will try to check blood sugars consistently both fasting and after meals and let us know if they are consistently high Adjust boluses based on meal size and carbohydrate  Follow-up in 3 months     There are no Patient Instructions on file for this visit.     Elayne Snare 03/28/2021, 9:33 AM   Note: This office note was prepared with Dragon voice recognition system technology. Any transcriptional errors that result from this process are unintentional.

## 2021-03-28 NOTE — Patient Instructions (Signed)
Check blood sugars on waking up 2-3 days a week  Also check blood sugars about 2 hours after meals and do this after different meals by rotation  Recommended blood sugar levels on waking up are 90-130 and about 2 hours after meal is 130-160  Please bring your blood sugar monitor to each visit, thank you   

## 2021-04-05 ENCOUNTER — Encounter: Payer: Self-pay | Admitting: Endocrinology

## 2021-05-10 DIAGNOSIS — Z79899 Other long term (current) drug therapy: Secondary | ICD-10-CM | POA: Diagnosis not present

## 2021-05-10 DIAGNOSIS — L821 Other seborrheic keratosis: Secondary | ICD-10-CM | POA: Diagnosis not present

## 2021-05-10 DIAGNOSIS — L920 Granuloma annulare: Secondary | ICD-10-CM | POA: Diagnosis not present

## 2021-06-22 ENCOUNTER — Other Ambulatory Visit: Payer: Self-pay | Admitting: Endocrinology

## 2021-07-03 ENCOUNTER — Other Ambulatory Visit: Payer: Self-pay

## 2021-07-03 DIAGNOSIS — Z794 Long term (current) use of insulin: Secondary | ICD-10-CM

## 2021-07-03 DIAGNOSIS — E1165 Type 2 diabetes mellitus with hyperglycemia: Secondary | ICD-10-CM

## 2021-07-03 MED ORDER — V-GO 30 KIT: PACK | 3 refills | Status: DC

## 2021-07-13 ENCOUNTER — Other Ambulatory Visit: Payer: Self-pay

## 2021-07-13 ENCOUNTER — Other Ambulatory Visit: Payer: Medicare Other

## 2021-07-13 DIAGNOSIS — E1165 Type 2 diabetes mellitus with hyperglycemia: Secondary | ICD-10-CM

## 2021-07-13 DIAGNOSIS — R972 Elevated prostate specific antigen [PSA]: Secondary | ICD-10-CM | POA: Diagnosis not present

## 2021-07-13 DIAGNOSIS — Z794 Long term (current) use of insulin: Secondary | ICD-10-CM

## 2021-07-14 LAB — PSA: Prostate Specific Ag, Serum: 11.6 ng/mL — ABNORMAL HIGH (ref 0.0–4.0)

## 2021-07-18 ENCOUNTER — Other Ambulatory Visit: Payer: Self-pay

## 2021-07-18 ENCOUNTER — Ambulatory Visit (INDEPENDENT_AMBULATORY_CARE_PROVIDER_SITE_OTHER): Payer: Medicare Other | Admitting: Urology

## 2021-07-18 ENCOUNTER — Encounter: Payer: Self-pay | Admitting: Urology

## 2021-07-18 VITALS — BP 152/71 | HR 60

## 2021-07-18 DIAGNOSIS — N138 Other obstructive and reflux uropathy: Secondary | ICD-10-CM | POA: Diagnosis not present

## 2021-07-18 DIAGNOSIS — N401 Enlarged prostate with lower urinary tract symptoms: Secondary | ICD-10-CM | POA: Diagnosis not present

## 2021-07-18 DIAGNOSIS — R972 Elevated prostate specific antigen [PSA]: Secondary | ICD-10-CM

## 2021-07-18 DIAGNOSIS — R351 Nocturia: Secondary | ICD-10-CM

## 2021-07-18 LAB — MICROSCOPIC EXAMINATION
Bacteria, UA: NONE SEEN
Epithelial Cells (non renal): NONE SEEN /hpf (ref 0–10)
Renal Epithel, UA: NONE SEEN /hpf
WBC, UA: NONE SEEN /hpf (ref 0–5)

## 2021-07-18 LAB — URINALYSIS, ROUTINE W REFLEX MICROSCOPIC
Bilirubin, UA: NEGATIVE
Ketones, UA: NEGATIVE
Leukocytes,UA: NEGATIVE
Nitrite, UA: NEGATIVE
Protein,UA: NEGATIVE
Specific Gravity, UA: 1.015 (ref 1.005–1.030)
Urobilinogen, Ur: 0.2 mg/dL (ref 0.2–1.0)
pH, UA: 5.5 (ref 5.0–7.5)

## 2021-07-18 MED ORDER — ALFUZOSIN HCL ER 10 MG PO TB24: 10.0000 mg | ORAL_TABLET | Freq: Every day | ORAL | 3 refills | Status: DC

## 2021-07-18 MED ORDER — FINASTERIDE 5 MG PO TABS: 5.0000 mg | ORAL_TABLET | Freq: Every day | ORAL | 3 refills | Status: DC

## 2021-07-18 NOTE — Patient Instructions (Signed)
Prostate-Specific Antigen Test Why am I having this test? The prostate-specific antigen (PSA) test is a screening test for prostate cancer. It can identify early signs of prostate cancer, which may allow for more effective treatment. Your health care provider may recommend that you have a PSA test starting at age 76 or that you have one earlier or later, depending on your risk factors for prostate cancer. You may also have a PSA test: To monitor treatment of prostate cancer. To check whether prostate cancer has returned after treatment. If you have signs of other conditions that can affect PSA levels, such as: An enlarged prostate that is not caused by cancer (benign prostatic hyperplasia, BPH). This condition is very common in older men. A prostate infection. What is being tested? This test measures the amount of PSA in your blood. PSA is a protein that is made in the prostate. The prostate naturally produces more PSA as you age, butvery high levels may be a sign of a medical condition. What kind of sample is taken?  A blood sample is required for this test. It is usually collected by inserting a needle into a blood vessel or by sticking a finger with a small needle. Bloodfor this test should be drawn before having an exam of the prostate. How do I prepare for this test? Do not ejaculate starting 24 hours before your test, or as long as told by your healthcare provider. Tell a health care provider about: Any allergies you have. All medicines you are taking, including vitamins, herbs, eye drops, creams, and over-the-counter medicines. This also includes: Medicines to assist with hair growth, such as finasteride. Any recent exposure to a medicine called diethylstilbestrol. Any blood disorders you have. Any recent procedures you have had, especially any procedures involving the prostate or rectum. Any medical conditions you have. Any recent urinary tract infections (UTIs) you have had. How are  the results reported? Your test results will be reported as a value that indicates how much PSA is in your blood. This will be given as nanograms of PSA per milliliter of blood (ng/mL). Your health care provider will compare your results to normal ranges that were established after testing a large group of people (reference ranges). Reference ranges may vary among labs and hospitals. PSA levels vary from person to person and generally increase with age. Because of this variation, there is no single PSA value that is considered normal for everyone. Instead, PSA reference ranges are used to describe whether your PSA levels are considered low or high (elevated). Common reference ranges are: Low: 0-2.5 ng/mL. Slightly to moderately elevated: 2.6-10.0 ng/mL. Moderately elevated: 10.0-19.9 ng/mL. Significantly elevated: 20 ng/mL or greater. Sometimes, the test results may report that a condition is present when it is not present (false-positive result). What do the results mean? A test result that is higher than 4 ng/mL may mean that you are at an increased risk for prostate cancer. However, a PSA test by itself is not enough to diagnose prostate cancer. High PSA levels may also be caused by the natural aging process, prostate infection, or BPH. PSA screening cannot tell you if your PSA is high due to cancer or a different cause. A prostate biopsy is theonly way to diagnose prostate cancer. A risk of having the PSA test is diagnosing and treating prostate cancer that would never have caused any symptoms or problems (overdiagnosis and overtreatment). Talk with your health care provider about what your results mean. Questions to ask your  health care provider Ask your health care provider, or the department that is doing the test: When will my results be ready? How will I get my results? What are my treatment options? What other tests do I need? What are my next steps? Summary The prostate-specific  antigen (PSA) test is a screening test for prostate cancer. Your health care provider may recommend that you have a PSA test starting at age 54 or that you have one earlier or later, depending on your risk factors for prostate cancer. A test result that is higher than 4 ng/mL may mean that you are at an increased risk for prostate cancer. However, elevated levels can be caused by a number of conditions other than prostate cancer. Talk with your health care provider about what your results mean. This information is not intended to replace advice given to you by your health care provider. Make sure you discuss any questions you have with your healthcare provider. Document Revised: 08/10/2020 Document Reviewed: 08/10/2020 Elsevier Patient Education  2022 [name].

## 2021-07-18 NOTE — Progress Notes (Signed)
Urological Symptom Review  Patient is experiencing the following symptoms: Get up at night to urinate Leakage of urine Erection problems (male only)   Review of Systems  Gastrointestinal (upper)  : Negative for upper GI symptoms  Gastrointestinal (lower) : Diarrhea  Constitutional : Negative for symptoms  Skin: Negative for skin symptoms  Eyes: Negative for eye symptoms  Ear/Nose/Throat : Negative for Ear/Nose/Throat symptoms  Hematologic/Lymphatic: Negative for Hematologic/Lymphatic symptoms  Cardiovascular : Negative for cardiovascular symptoms  Respiratory : Negative for respiratory symptoms  Endocrine: Negative for endocrine symptoms  Musculoskeletal: Negative for musculoskeletal symptoms  Neurological: Negative for neurological symptoms  Psychologic: Negative for psychiatric symptoms

## 2021-07-18 NOTE — Progress Notes (Signed)
07/18/2021 9:12 AM   Alan Lopez 10-03-1946 [id number]  Referring provider: Janith Lima, MD [place],  [place] [address]  Followup elevated PSA and BPH   HPI: Mr Peschke is a 75yo here for followup for elevated PSA and BPH with nocturia. He has stable LUTS on uroxatral and finasteride. Nocturia 2x. Urine stream strong. PSA decreased to 11.6 from 20 six months ago.    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   HTN (hypertension)    Hypertrophy (benign) of prostate    Multinodular goiter 04/12/2015   Non-insulin dependent type 2 diabetes mellitus ([facility])    Status post non-ST elevation myocardial infarction (NSTEMI)    CATH   Tobacco abuse    Type II diabetes mellitus, uncontrolled ([facility]) 04/12/2015    Surgical History: Past Surgical History:  Procedure Laterality Date   CORONARY ANGIOPLASTY WITH STENT PLACEMENT      Home Medications:  Allergies as of 07/18/2021       Reactions   Atorvastatin Other (See Comments)   Codeine Other (See Comments)   Patient doesn't like how it makes him feel, "spaced out, not together"        Medication List        Accurate as of July 18, 2021  9:12 AM. If you have any questions, ask your nurse or doctor.          alfuzosin 10 MG 24 hr tablet Commonly known as: UROXATRAL Take 1 tablet (10 mg total) by mouth daily with breakfast.   aspirin EC 81 MG tablet Take 81 mg by mouth daily.   cholecalciferol 1000 units tablet Commonly known as: VITAMIN D Take 1,000 Units by mouth daily.   dapagliflozin propanediol 10 MG Tabs tablet Commonly known as: Farxiga Take 2 tablets (20 mg total) by mouth daily.   dutasteride 0.5 MG capsule Commonly known as: AVODART TAKE ONE CAPSULE BY MOUTH DAILY   empagliflozin 25 MG Tabs tablet Commonly known as: Jardiance Take 1 tablet daily ( to replace [place] per insurance request)   finasteride 5 MG tablet Commonly known as:  PROSCAR Take 1 tablet (5 mg total) by mouth daily.   hydroxychloroquine 200 MG tablet Commonly known as: PLAQUENIL Take 200 mg by mouth 2 (two) times daily.   Insulin Pen Needle 32G X 4 MM Misc Use Three per day to inject insulin   insulin regular 100 units/mL injection Commonly known as: NOVOLIN R Inject into the skin 3 (three) times daily before meals. USE INSULIN TO FILL UP v-GO PUMP   losartan 25 MG tablet Commonly known as: COZAAR Take 1 tablet (25 mg total) by mouth daily.   meloxicam 7.5 MG tablet Commonly known as: MOBIC Take 1 tablet (7.5 mg total) by mouth daily.   pioglitazone 15 MG tablet Commonly known as: ACTOS TAKE ONE TABLET BY MOUTH DAILY   rosuvastatin 10 MG tablet Commonly known as: Crestor Take 1 tablet (10 mg total) by mouth at bedtime.   tadalafil 20 MG tablet Commonly known as: CIALIS TAKE ONE TABLET BY MOUTH DAILY AS NEEDED FOR FOR ERECTILE DYSFUNCTION   V-Go 30 Kit Use as directed.Use to inject up to 30 units of insulin daily        Allergies:  Allergies  Allergen Reactions   Atorvastatin Other (See Comments)   Codeine Other (See Comments)    Patient doesn't like how it makes him feel, "spaced out, not together"  Family History: Family History  Problem Relation Age of Onset   Diabetes Mother    Diabetes Brother    Cancer Brother    Heart disease Neg Hx     Social History:  reports that he has been smoking cigarettes. He has never used smokeless tobacco. He reports current alcohol use of about 10.0 standard drinks of alcohol per week. He reports that he does not use drugs.  ROS: All other review of systems were reviewed and are negative except what is noted above in HPI  Physical Exam: BP (!) 152/71   Pulse 60   Constitutional:  Alert and oriented, No acute distress. HEENT: [place] AT, moist mucus membranes.  Trachea midline, no masses. Cardiovascular: No clubbing, cyanosis, or edema. Respiratory: Normal respiratory effort, no  increased work of breathing. GI: Abdomen is soft, nontender, nondistended, no abdominal masses GU: No CVA tenderness.  Lymph: No cervical or inguinal lymphadenopathy. Skin: No rashes, bruises or suspicious lesions. Neurologic: Grossly intact, no focal deficits, moving all 4 extremities. Psychiatric: Normal mood and affect.  Laboratory Data: Lab Results  Component Value Date   WBC 8.3 04/13/2019   HGB 14.9 04/13/2019   HCT 42.8 04/13/2019   MCV 94.9 04/13/2019   PLT 181.0 04/13/2019    Lab Results  Component Value Date   CREATININE 0.91 03/26/2021    No results found for: PSA  No results found for: TESTOSTERONE  Lab Results  Component Value Date   HGBA1C 7.1 (H) 03/26/2021    Urinalysis    Component Value Date/Time   COLORURINE YELLOW 01/29/2017 1024   APPEARANCEUR Clear 01/17/2021 0930   LABSPEC 1.020 01/29/2017 1024   PHURINE 6.0 01/29/2017 1024   GLUCOSEU 3+ (A) 01/17/2021 0930   GLUCOSEU NEGATIVE 01/29/2017 1024   HGBUR NEGATIVE 01/29/2017 1024   BILIRUBINUR Negative 01/17/2021 0930   KETONESUR NEGATIVE 01/29/2017 1024   PROTEINUR Negative 01/17/2021 0930   UROBILINOGEN 0.2 01/29/2017 1024   NITRITE Negative 01/17/2021 0930   NITRITE NEGATIVE 01/29/2017 1024   LEUKOCYTESUR Negative 01/17/2021 0930    Lab Results  Component Value Date   LABMICR See below: 01/17/2021   WBCUA 0-5 01/17/2021   LABEPIT 0-10 01/17/2021   BACTERIA None seen 01/17/2021    Pertinent Imaging:  No results found for this or any previous visit.  No results found for this or any previous visit.  No results found for this or any previous visit.  No results found for this or any previous visit.  No results found for this or any previous visit.  No results found for this or any previous visit.  No results found for this or any previous visit.  No results found for this or any previous visit.   Assessment & Plan:    1. Elevated PSA -RTC 6 months with PSA -  Urinalysis, Routine w reflex microscopic  2. Benign prostatic hyperplasia with urinary obstruction -Continue uroxatral and finasteride  3. Nocturia -continue uroxatral and finasteride   No follow-ups on file.  Nicolette Bang, MD  [facility] [place]

## 2021-07-27 ENCOUNTER — Other Ambulatory Visit: Payer: Self-pay

## 2021-07-27 ENCOUNTER — Other Ambulatory Visit (INDEPENDENT_AMBULATORY_CARE_PROVIDER_SITE_OTHER): Payer: Medicare Other

## 2021-07-27 DIAGNOSIS — E1165 Type 2 diabetes mellitus with hyperglycemia: Secondary | ICD-10-CM | POA: Diagnosis not present

## 2021-07-27 DIAGNOSIS — Z794 Long term (current) use of insulin: Secondary | ICD-10-CM | POA: Diagnosis not present

## 2021-07-27 LAB — HEMOGLOBIN A1C: Hgb A1c MFr Bld: 6.6 % — ABNORMAL HIGH (ref 4.6–6.5)

## 2021-07-31 ENCOUNTER — Ambulatory Visit (INDEPENDENT_AMBULATORY_CARE_PROVIDER_SITE_OTHER): Payer: Medicare Other | Admitting: Endocrinology

## 2021-07-31 ENCOUNTER — Encounter: Payer: Self-pay | Admitting: Endocrinology

## 2021-07-31 ENCOUNTER — Other Ambulatory Visit: Payer: Self-pay

## 2021-07-31 VITALS — BP 142/78 | HR 56 | Ht 72.0 in | Wt 203.6 lb

## 2021-07-31 DIAGNOSIS — E7800 Pure hypercholesterolemia, unspecified: Secondary | ICD-10-CM | POA: Diagnosis not present

## 2021-07-31 DIAGNOSIS — Z794 Long term (current) use of insulin: Secondary | ICD-10-CM | POA: Diagnosis not present

## 2021-07-31 DIAGNOSIS — E1165 Type 2 diabetes mellitus with hyperglycemia: Secondary | ICD-10-CM

## 2021-07-31 DIAGNOSIS — I10 Essential (primary) hypertension: Secondary | ICD-10-CM | POA: Diagnosis not present

## 2021-07-31 LAB — COMPREHENSIVE METABOLIC PANEL WITH GFR
ALT: 52 U/L (ref 0–53)
AST: 35 U/L (ref 0–37)
Albumin: 3.9 g/dL (ref 3.5–5.2)
Alkaline Phosphatase: 58 U/L (ref 39–117)
BUN: 19 mg/dL (ref 6–23)
CO2: 22 meq/L (ref 19–32)
Calcium: 8.9 mg/dL (ref 8.4–10.5)
Chloride: 108 meq/L (ref 96–112)
Creatinine, Ser: 0.97 mg/dL (ref 0.40–1.50)
GFR: 75.85 mL/min (ref >60.00)
Glucose, Bld: 109 mg/dL — ABNORMAL HIGH (ref 70–99)
Potassium: 4.2 meq/L (ref 3.5–5.1)
Sodium: 136 meq/L (ref 135–145)
Total Bilirubin: 0.6 mg/dL (ref 0.2–1.2)
Total Protein: 6.5 g/dL (ref 6.0–8.3)

## 2021-07-31 NOTE — Patient Instructions (Addendum)
Check blood sugars on waking up 3-4 days a week  Also check blood sugars about 2 hours after meals and do this after different meals by rotation  Recommended blood sugar levels on waking up are 90-130 and about 2 hours after meal is 130-160  Please bring your blood sugar monitor to each visit, thank you  1/2 Jardiance  If BP is 140 or more then take full Losartan

## 2021-07-31 NOTE — Progress Notes (Signed)
Patient ID: Alan Lopez, male   DOB: 1946/01/10, 75 y.o.   MRN: [id number]            Reason for Appointment: Follow-up for Type 2 Diabetes   History of Present Illness:          Date of diagnosis of type 2 diabetes mellitus :  1999      Past history:  He had modestly increase blood sugars at diagnosis and was started on metformin Subsequently glipizide was added to improve his control. He thinks that his blood sugars have been mostly poorly controlled over the last several years He had been on glipizide and metformin for years without any adjustment of his dosage of glipizide  Review of his A1c shows that it had ranged from 9-9.5 since 07/2013 Previously his PCP tried him on a sample of Jardiance 10 mg without significant improvement  He was started on the V-go pump on 07/02/17 because of inconsistent control and poor compliance with mealtime insulin  Recent history:   Oral hypoglycemic drugs the patient is taking are: Actos 15 mg, Jardiance 25 mg daily  Current INSULIN regimen: V-go pump SETTINGS: Basal rate = 30 units BOLUSES = 3-4 units at breakfast, 6-8 at lunch and  6-10 U at dinner  A1c is improved at 6.6  Fructosamine previously 225  Current blood sugar patterns and problems identified: He did bring his monitor for download  Again he has checked blood sugars only sporadically with only a few random readings in the mornings or midday and once only at bedtime He notices that he is getting low blood sugars overnight a couple of times a week, usually around 3-4 AM, lowest blood sugar recently 62 Although he was supposed to take only half tablet of Jardiance and he is taking the full tablet of 25 mg, previously on Farxiga He thinks he is planning his meals well and not eating out too much Weight is about the same He usually is adjusting his bolus clicks based on how much he is eating and carbohydrate intake, and generally taking 6 to 8 units except at breakfast when he  is taking less He has a couple of high readings late morning possibly after breakfast otherwise no high sugars documented He is somewhat more active with working around his home with projects but not formally walking  Side effects from medications have been: Mild  diarrhea from regular metformin  Compliance with the medical regimen: fair      Glucose monitoring:  done less than once a day usually       Glucometer:   Contour   Blood sugars by download   PRE-MEAL Fasting Lunch Dinner Bedtime Overall  Glucose range: 62-101 151  93   Mean/median: 84    115   POST-MEAL PC Breakfast PC Lunch PC Dinner  Glucose range: 153-161    Mean/median:       Previously by recall Fasting: 99-122 PC <200 160s  Previously:    Self-care: The diet that the patient has been following is: tries to limit high carbohydrate foods. eating out about 4 times a week     Typical meal intake: Breakfast is  Toast/meat .  Usually 2 servings of vegetables in the evening and avoiding potatoes and bread usually Usually avoiding drinks with sugar                Dietician visit, most recent: 05/2015  CDE visit: 01/2016  Weight history: Maximum weight 215 previously  Wt  Readings from Last 3 Encounters:  07/31/21 203 lb 9.6 oz (92.4 kg)  03/28/21 204 lb 3.2 oz (92.6 kg)  01/17/21 204 lb (92.5 kg)    Glycemic control:    Lab Results  Component Value Date   HGBA1C 6.6 (H) 07/27/2021   HGBA1C 7.1 (H) 03/26/2021   HGBA1C 7.5 (H) 12/18/2020   Lab Results  Component Value Date   MICROALBUR <0.7 12/18/2020   LDLCALC 30 03/26/2021   CREATININE 0.97 07/31/2021    Office Visit on 07/31/2021  Component Date Value Ref Range Status   Sodium 07/31/2021 136  135 - 145 mEq/L Final   Potassium 07/31/2021 4.2  3.5 - 5.1 mEq/L Final   Chloride 07/31/2021 108  96 - 112 mEq/L Final   CO2 07/31/2021 22  19 - 32 mEq/L Final   Glucose, Bld 07/31/2021 109 (A) 70 - 99 mg/dL Final   BUN 07/31/2021 19  6 - 23 mg/dL Final    Creatinine, Ser 07/31/2021 0.97  0.40 - 1.50 mg/dL Final   Total Bilirubin 07/31/2021 0.6  0.2 - 1.2 mg/dL Final   Alkaline Phosphatase 07/31/2021 58  39 - 117 U/L Final   AST 07/31/2021 35  0 - 37 U/L Final   ALT 07/31/2021 52  0 - 53 U/L Final   Total Protein 07/31/2021 6.5  6.0 - 8.3 g/dL Final   Albumin 07/31/2021 3.9  3.5 - 5.2 g/dL Final   GFR 07/31/2021 75.85  >60.00 mL/min Final   Calculated using the CKD-EPI Creatinine Equation (2021)   Calcium 07/31/2021 8.9  8.4 - 10.5 mg/dL Final  Lab on 07/27/2021  Component Date Value Ref Range Status   Hgb A1c MFr Bld 07/27/2021 6.6 (A) 4.6 - 6.5 % Final   Glycemic Control Guidelines for People with Diabetes:Non Diabetic:  <6%Goal of Therapy: <7%Additional Action Suggested:  >8%       Allergies as of 07/31/2021       Reactions   Atorvastatin Other (See Comments)   Codeine Other (See Comments)   Patient doesn't like how it makes him feel, "spaced out, not together"        Medication List        Accurate as of July 31, 2021  2:27 PM. If you have any questions, ask your nurse or doctor.          STOP taking these medications    dapagliflozin propanediol 10 MG Tabs tablet Commonly known as: Farxiga Stopped by: Ajay Kumar, MD   meloxicam 7.5 MG tablet Commonly known as: MOBIC Stopped by: Ajay Kumar, MD       TAKE these medications    alfuzosin 10 MG 24 hr tablet Commonly known as: UROXATRAL Take 1 tablet (10 mg total) by mouth daily with breakfast.   aspirin EC 81 MG tablet Take 81 mg by mouth daily.   cholecalciferol 1000 units tablet Commonly known as: VITAMIN D Take 1,000 Units by mouth daily.   dutasteride 0.5 MG capsule Commonly known as: AVODART TAKE ONE CAPSULE BY MOUTH DAILY   empagliflozin 25 MG Tabs tablet Commonly known as: Jardiance Take 1 tablet daily ( to replace Farxiga per insurance request)   finasteride 5 MG tablet Commonly known as: PROSCAR Take 1 tablet (5 mg total) by mouth  daily.   hydroxychloroquine 200 MG tablet Commonly known as: PLAQUENIL Take 200 mg by mouth 2 (two) times daily.   Insulin Pen Needle 32G X 4 MM Misc Use Three per day to inject insulin     insulin regular 100 units/mL injection Commonly known as: NOVOLIN R Inject into the skin 3 (three) times daily before meals. USE INSULIN TO FILL UP v-GO PUMP   losartan 25 MG tablet Commonly known as: COZAAR Take 1 tablet (25 mg total) by mouth daily.   pioglitazone 15 MG tablet Commonly known as: ACTOS TAKE ONE TABLET BY MOUTH DAILY   rosuvastatin 10 MG tablet Commonly known as: Crestor Take 1 tablet (10 mg total) by mouth at bedtime.   tadalafil 20 MG tablet Commonly known as: CIALIS TAKE ONE TABLET BY MOUTH DAILY AS NEEDED FOR FOR ERECTILE DYSFUNCTION   V-Go 30 Kit Use as directed.Use to inject up to 30 units of insulin daily        Allergies:  Allergies  Allergen Reactions   Atorvastatin Other (See Comments)   Codeine Other (See Comments)    Patient doesn't like how it makes him feel, "spaced out, not together"    Past Medical History:  Diagnosis Date   Blindness of left eye 04/12/2015   HTN (hypertension)    Hypertrophy (benign) of prostate    Multinodular goiter 04/12/2015   Non-insulin dependent type 2 diabetes mellitus ([facility])    Status post non-ST elevation myocardial infarction (NSTEMI)    CATH   Tobacco abuse    Type II diabetes mellitus, uncontrolled ([facility]) 04/12/2015    Past Surgical History:  Procedure Laterality Date   CORONARY ANGIOPLASTY WITH STENT PLACEMENT      Family History  Problem Relation Age of Onset   Diabetes Mother    Diabetes Brother    Cancer Brother    Heart disease Neg Hx     Social History:  reports that he has been smoking cigarettes. He has never used smokeless tobacco. He reports current alcohol use of about 10.0 standard drinks per week. He reports that he does not use drugs.    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   His last eye exam was in 08/2020  THYROID: Previously was found to have a 2 cm nodule on his left thyroid on his exam but ultrasound in 04/2015 showed multinodular goiter with mostly small nodules and the largest nodule on the left side of 1.4 cm has the appearance of a pseudo- nodule  Thyroid levels have been normal  Lab Results  Component Value Date   TSH 1.80 12/18/2020    NEUROPATHY: Has feeling of numbness on the soles of his feet which is not present consistently Also may feel a sensation of numbness when his foot is on the car accelerator pedal He has no numbness in the toes    LABS:  Office Visit on 07/31/2021  Component Date Value Ref Range Status   Sodium 07/31/2021 136  135 - 145 mEq/L Final   Potassium 07/31/2021 4.2  3.5 - 5.1 mEq/L Final   Chloride 07/31/2021 108  96 - 112 mEq/L Final   CO2 07/31/2021 22  19 - 32 mEq/L Final   Glucose,   Bld 07/31/2021 109 (A) 70 - 99 mg/dL Final   BUN 07/31/2021 19  6 - 23 mg/dL Final   Creatinine, Ser 07/31/2021 0.97  0.40 - 1.50 mg/dL Final   Total Bilirubin 07/31/2021 0.6  0.2 - 1.2 mg/dL Final   Alkaline Phosphatase 07/31/2021 58  39 - 117 U/L Final   AST 07/31/2021 35  0 - 37 U/L Final   ALT 07/31/2021 52  0 - 53 U/L Final   Total Protein 07/31/2021 6.5  6.0 - 8.3 g/dL Final   Albumin 07/31/2021 3.9  3.5 - 5.2 g/dL Final   GFR 07/31/2021 75.85  >60.00 mL/min Final   Calculated using the CKD-EPI Creatinine Equation (2021)   Calcium 07/31/2021 8.9  8.4 - 10.5 mg/dL Final  Lab on 07/27/2021  Component Date Value Ref Range Status   Hgb A1c MFr Bld  07/27/2021 6.6 (A) 4.6 - 6.5 % Final   Glycemic Control Guidelines for People with Diabetes:Non Diabetic:  <6%Goal of Therapy: <7%Additional Action Suggested:  >8%     Physical Examination:  BP (!) 142/78   Pulse (!) 56   Ht 6' (1.829 m)   Wt 203 lb 9.6 oz (92.4 kg)   SpO2 96%   BMI 27.61 kg/m      ASSESSMENT:  Diabetes type 2, on insulin  See history of present illness for detailed discussion of his current management, blood sugar patterns and problems identified  A1c is improved at 6.6  His A1c likely better from increasing the dose of his Jardiance  Previously had been concerned about the cost of Jardiance at the full dose However is getting more frequent low sugars overnight, twice a week or so  Generally not monitoring blood sugars much and difficult to assess his postprandial readings recently  He is also not wanting to use the freestyle libre because he is using his arms for the V-go pump  Compliance with his boluses fairly good  Mild hypertension: Blood pressure is relatively higher today, reportedly only taking 12.5 mg losartan from cardiologist  Mild increase in liver function: Followed by dermatologist  Renal function normal, will need microalbumin annually  Lipids: Well-controlled and still on 10 mg rosuvastatin as per cardiologist  PLAN:   He can take 1/2 tablet of the Jardiance  If he still continues to have frequent low sugars overnight he will call for the 20 units basal on the V-go pump  Needs more readings after meals on a consistent basis to help adjust his bolus clicks  Given him guidelines on blood sugar targets  Also he will check blood pressure at least once a week at home and if his blood pressure is 161 or more systolic he will take the full 25 mg of losartan and follow-up with cardiologist No change in Actos Check chemistry panel today     Patient Instructions  Check blood sugars on waking up 3-4 days a week  Also check blood sugars  about 2 hours after meals and do this after different meals by rotation  Recommended blood sugar levels on waking up are 90-130 and about 2 hours after meal is 130-160  Please bring your blood sugar monitor to each visit, thank you  1/2 Jardiance  If BP is 140 or more then take full Losartan     Elayne Snare 07/31/2021, 2:27 PM   Note: This office note was prepared with Dragon voice recognition system technology. Any transcriptional errors that result from this process are unintentional.

## 2021-08-07 NOTE — Progress Notes (Signed)
Sent via mychart

## 2021-09-26 ENCOUNTER — Other Ambulatory Visit: Payer: Self-pay | Admitting: Endocrinology

## 2021-10-16 NOTE — Progress Notes (Signed)
Cardiology Office Note:    Date:  10/17/2021   ID:  [name], DOB 1946/04/17, MRN [id number]  PCP:  Janith Lima, MD   [facility] Providers Cardiologist:  Candee Furbish, MD     Referring MD: Janith Lima, MD   Chief Complaint:  F/u for CAD     Patient Profile:   Alan Lopez is a 75 y.o. male with:  Coronary artery disease  NSTEMI in 1/17 s/p DES to RCA  Intol of Ticagrelor b/c of shortness of breath  Ischemic CM EF 45-50, inf HK on LV gram in 2017 Echocardiogram 2017: EF 50-55, inf HK Bradycardia - beta-blocker DC'd  Hypertension  Hyperlipidemia  Diabetes mellitus  L eye blindness  BPH Multinodular goiter  +Cigs   History of Present Illness: Alan Lopez was last seen by Dr. Marlou Porch in 12/21.  He returns for f/u.  He is here alone.  He has been doing well without chest pain, shortness of breath, syncope, orthopnea, leg edema.  He has had some issues with balance as well as vertigo.  However, [place]      ASSESSMENT & PLAN:   CAD (coronary artery disease) History of non-STEMI in 2017 treated a DES to the RCA.  He is doing well without angina.  He is no longer on beta-blocker therapy due to bradycardia.  Continue current regimen which includes aspirin, rosuvastatin.  Hyperlipidemia LDL goal <70 Recent LDL optimal at 30 on current dose of rosuvastatin.  Essential hypertension Blood pressure well controlled on current regimen which includes losartan.           Dispo:  Return in about 1 year (around 10/17/2022) for Routine Follow Up w/ Dr. Marlou Porch.    Prior CV studies: Echocardiogram 12/12/15 EF 50-55, inf HK, mod LAE, normal RVSF, mild MR, mild TR, RVSP 12.14  Cardiac catheterization 12/12/15 EF 45-50, inf HK LM normal LAD irregs; D1 ost 40 RI normal LCx irregs  RCA mid to dist 80 PCI: 4 x 15 mm Xience DES to mRCA      Past Medical History:  Diagnosis Date   Blindness of  left eye 04/12/2015   HTN (hypertension)    Hypertrophy (benign) of prostate    Multinodular goiter 04/12/2015   Non-insulin dependent type 2 diabetes mellitus ([place])    Status post non-ST elevation myocardial infarction (NSTEMI)    CATH   Tobacco abuse    Type II diabetes mellitus, uncontrolled 04/12/2015   Current Medications: Current Meds  Medication Sig   alfuzosin (UROXATRAL) 10 MG 24 hr tablet Take 1 tablet (10 mg total) by mouth daily with breakfast.   aspirin EC 81 MG tablet Take 81 mg by mouth daily.   cholecalciferol (VITAMIN D) 1000 UNITS tablet Take 1,000 Units by mouth daily.   dutasteride (AVODART) 0.5 MG capsule TAKE ONE CAPSULE BY MOUTH DAILY   finasteride (PROSCAR) 5 MG tablet Take 1 tablet (5 mg total) by mouth daily.   hydroxychloroquine (PLAQUENIL) 200 MG tablet Take 200 mg by mouth 2 (two) times daily.   Insulin Disposable Pump (V-GO 30) KIT Use as directed.Use to inject up to 30 units of insulin daily   Insulin Pen Needle 32G X 4 MM MISC Use Three per day to inject insulin   insulin regular (NOVOLIN R,HUMULIN R) 100 units/mL injection Inject into the skin 3 (three) times daily before meals. USE INSULIN TO FILL UP v-GO PUMP  JARDIANCE 25 MG TABS tablet TAKE ONE TABLET BY MOUTH DAILY (Patient taking differently: Patient take 1/2 tablet by mouth once daily)   losartan (COZAAR) 25 MG tablet Take 1 tablet (25 mg total) by mouth daily. (Patient taking differently: Take 25 mg by mouth daily. Patient take 1/2 tablet by mouth once daily)   pioglitazone (ACTOS) 15 MG tablet TAKE ONE TABLET BY MOUTH DAILY   rosuvastatin (CRESTOR) 10 MG tablet Take 1 tablet (10 mg total) by mouth at bedtime.    Allergies:   Atorvastatin and Codeine   Social History   Tobacco Use   Smoking status: Light Smoker    Types: Cigarettes   Smokeless tobacco: Never   Tobacco comments:    smokes on social occassions  Vaping Use   Vaping Use: Never used  Substance Use Topics   Alcohol use: Yes     Alcohol/week: 10.0 standard drinks    Types: 10 Cans of beer per week    Comment: socially   Drug use: No    Family Hx: The patient's family history includes Cancer in his brother; Diabetes in his brother and mother. There is no history of Heart disease.  Review of Systems  Cardiovascular:  Negative for claudication.  Musculoskeletal:  Positive for joint pain.  Neurological:  Positive for vertigo.    EKGs/Labs/Other Test Reviewed:    EKG:  EKG is   ordered today.  The ekg ordered today demonstrates NSR, HR 67, normal axis, no ST-T wave changes, QTC 439  Recent Labs: 12/18/2020: TSH 1.80 07/31/2021: ALT 52; BUN 19; Creatinine, Ser 0.97; Potassium 4.2; Sodium 136   Recent Lipid Panel Lab Results  Component Value Date/Time   CHOL 102 03/26/2021 08:37 AM   CHOL 120 01/18/2019 08:46 AM   TRIG 57.0 03/26/2021 08:37 AM   HDL 60.70 03/26/2021 08:37 AM   HDL 67 01/18/2019 08:46 AM   LDLCALC 30 03/26/2021 08:37 AM   LDLCALC 39 01/18/2019 08:46 AM     Risk Assessment/Calculations:          Physical Exam:    VS:  BP 124/64   Pulse 68   Ht 6' (1.829 m)   Wt 205 lb (93 kg)   SpO2 95%   BMI 27.80 kg/m     Wt Readings from Last 3 Encounters:  10/17/21 205 lb (93 kg)  07/31/21 203 lb 9.6 oz (92.4 kg)  03/28/21 204 lb 3.2 oz (92.6 kg)    Constitutional:      Appearance: Healthy appearance. Not in distress.  Neck:     Vascular: No carotid bruit or JVR. JVD normal.  Pulmonary:     Effort: Pulmonary effort is normal.     Breath sounds: No wheezing. No rales.  Cardiovascular:     Normal rate. Regular rhythm. Normal S1. Normal S2.      Murmurs: There is no murmur.  Pulses:    Intact distal pulses.  Edema:    Peripheral edema absent.  Abdominal:     Palpations: Abdomen is soft. There is no hepatomegaly.  Skin:    General: Skin is warm and dry.  Neurological:     General: No focal deficit present.     Mental Status: Alert and oriented to person, place and time.      Cranial Nerves: Cranial nerves are intact.        Medication Adjustments/Labs and Tests Ordered: Current medicines are reviewed at length with the patient today.  Concerns regarding medicines are outlined above.  Tests Ordered: Orders Placed This Encounter  Procedures   EKG 12-Lead    Medication Changes: No orders of the defined types were placed in this encounter.  Signed, Richardson Dopp, PA-C  10/17/2021 9:37 AM    [facility] [address], [place], [place]  [address] Phone: [phone]; Fax: [phone]

## 2021-10-17 ENCOUNTER — Other Ambulatory Visit: Payer: Self-pay

## 2021-10-17 ENCOUNTER — Ambulatory Visit (INDEPENDENT_AMBULATORY_CARE_PROVIDER_SITE_OTHER): Payer: Medicare Other | Admitting: Physician Assistant

## 2021-10-17 ENCOUNTER — Encounter: Payer: Self-pay | Admitting: Physician Assistant

## 2021-10-17 VITALS — BP 124/64 | HR 68 | Ht 72.0 in | Wt 205.0 lb

## 2021-10-17 DIAGNOSIS — I10 Essential (primary) hypertension: Secondary | ICD-10-CM | POA: Diagnosis not present

## 2021-10-17 DIAGNOSIS — I2510 Atherosclerotic heart disease of native coronary artery without angina pectoris: Secondary | ICD-10-CM | POA: Diagnosis not present

## 2021-10-17 DIAGNOSIS — E785 Hyperlipidemia, unspecified: Secondary | ICD-10-CM | POA: Diagnosis not present

## 2021-10-17 NOTE — Patient Instructions (Signed)
Medication Instructions:   Your physician recommends that you continue on your current medications as directed. Please refer to the Current Medication list given to you today.  *If you need a refill on your cardiac medications before your next appointment, please call your pharmacy*   Lab Work:  -NONE  If you have labs (blood work) drawn today and your tests are completely normal, you will receive your results only by: [place] (if you have MyChart) OR A paper copy in the mail If you have any lab test that is abnormal or we need to change your treatment, we will call you to review the results.   Testing/Procedures:  -NONE   Follow-Up: At [facility], you and your health needs are our priority.  As part of our continuing mission to provide you with exceptional heart care, we have created designated Provider Care Teams.  These Care Teams include your primary Cardiologist (physician) and Advanced Practice Providers (APPs -  Physician Assistants and Nurse Practitioners) who all work together to provide you with the care you need, when you need it.  We recommend signing up for the patient portal called "MyChart".  Sign up information is provided on this After Visit Summary.  MyChart is used to connect with patients for Virtual Visits (Telemedicine).  Patients are able to view lab/test results, encounter notes, upcoming appointments, etc.  Non-urgent messages can be sent to your provider as well.   To learn more about what you can do with MyChart, go to [email or web address].    Your next appointment:   1 year(s)  The format for your next appointment:   In Person  Provider:   Candee Furbish, MD     Other Instructions  Your physician wants you to follow-up in: 1 year with Dr. Marlou Porch.  You will receive a reminder letter in the mail two months in advance. If you don't receive a letter, please call our office to schedule the follow-up appointment.

## 2021-10-17 NOTE — Assessment & Plan Note (Signed)
Recent LDL optimal at 30 on current dose of rosuvastatin.

## 2021-10-17 NOTE — Assessment & Plan Note (Signed)
History of non-STEMI in 2017 treated a DES to the RCA.  He is doing well without angina.  He is no longer on beta-blocker therapy due to bradycardia.  Continue current regimen which includes aspirin, rosuvastatin.

## 2021-10-17 NOTE — Assessment & Plan Note (Signed)
Blood pressure well controlled on current regimen which includes losartan.

## 2021-11-12 DIAGNOSIS — L570 Actinic keratosis: Secondary | ICD-10-CM | POA: Diagnosis not present

## 2021-11-12 DIAGNOSIS — L920 Granuloma annulare: Secondary | ICD-10-CM | POA: Diagnosis not present

## 2021-11-12 DIAGNOSIS — L820 Inflamed seborrheic keratosis: Secondary | ICD-10-CM | POA: Diagnosis not present

## 2021-11-19 ENCOUNTER — Other Ambulatory Visit: Payer: Self-pay

## 2021-11-19 ENCOUNTER — Other Ambulatory Visit (INDEPENDENT_AMBULATORY_CARE_PROVIDER_SITE_OTHER): Payer: Medicare Other

## 2021-11-19 DIAGNOSIS — Z794 Long term (current) use of insulin: Secondary | ICD-10-CM

## 2021-11-19 DIAGNOSIS — E1165 Type 2 diabetes mellitus with hyperglycemia: Secondary | ICD-10-CM | POA: Diagnosis not present

## 2021-11-19 LAB — COMPREHENSIVE METABOLIC PANEL
ALT: 38 U/L (ref 0–53)
AST: 24 U/L (ref 0–37)
Albumin: 3.9 g/dL (ref 3.5–5.2)
Alkaline Phosphatase: 65 U/L (ref 39–117)
BUN: 19 mg/dL (ref 6–23)
CO2: 20 mEq/L (ref 19–32)
Calcium: 8.6 mg/dL (ref 8.4–10.5)
Chloride: 107 mEq/L (ref 96–112)
Creatinine, Ser: 0.91 mg/dL (ref 0.40–1.50)
GFR: 81.71 mL/min (ref >60.00)
Glucose, Bld: 203 mg/dL — ABNORMAL HIGH (ref 70–99)
Potassium: 4.2 mEq/L (ref 3.5–5.1)
Sodium: 138 mEq/L (ref 135–145)
Total Bilirubin: 0.9 mg/dL (ref 0.2–1.2)
Total Protein: 6.1 g/dL (ref 6.0–8.3)

## 2021-11-19 LAB — MICROALBUMIN / CREATININE URINE RATIO
Creatinine,U: 75.1 mg/dL
Microalb Creat Ratio: 1.4 mg/g (ref 0.0–30.0)
Microalb, Ur: 1 mg/dL (ref 0.0–1.9)

## 2021-11-19 LAB — HEMOGLOBIN A1C: Hgb A1c MFr Bld: 7 % — ABNORMAL HIGH (ref 4.6–6.5)

## 2021-11-21 ENCOUNTER — Encounter: Payer: Self-pay | Admitting: Endocrinology

## 2021-11-21 ENCOUNTER — Ambulatory Visit (INDEPENDENT_AMBULATORY_CARE_PROVIDER_SITE_OTHER): Payer: Medicare Other | Admitting: Endocrinology

## 2021-11-21 ENCOUNTER — Other Ambulatory Visit: Payer: Self-pay

## 2021-11-21 VITALS — BP 110/60 | HR 59 | Ht 72.0 in | Wt 206.6 lb

## 2021-11-21 DIAGNOSIS — Z794 Long term (current) use of insulin: Secondary | ICD-10-CM

## 2021-11-21 DIAGNOSIS — Z23 Encounter for immunization: Secondary | ICD-10-CM | POA: Diagnosis not present

## 2021-11-21 DIAGNOSIS — E1165 Type 2 diabetes mellitus with hyperglycemia: Secondary | ICD-10-CM

## 2021-11-21 DIAGNOSIS — I10 Essential (primary) hypertension: Secondary | ICD-10-CM | POA: Diagnosis not present

## 2021-11-21 DIAGNOSIS — I2510 Atherosclerotic heart disease of native coronary artery without angina pectoris: Secondary | ICD-10-CM

## 2021-11-21 MED ORDER — METFORMIN HCL ER 500 MG PO TB24: 500.0000 mg | ORAL_TABLET | Freq: Every day | ORAL | 3 refills | Status: DC

## 2021-11-21 NOTE — Patient Instructions (Signed)
Check blood sugars on waking up 3-4 days a week  Also check blood sugars about 2 hours after meals and do this after different meals by rotation  Recommended blood sugar levels on waking up are 90-130 and about 2 hours after meal is 130-160  Please bring your blood sugar monitor to each visit, thank you   

## 2021-11-21 NOTE — Progress Notes (Signed)
Patient ID: Alan Lopez, male   DOB: Mar 16, 1946, 75 y.o.   MRN: [id number]            Reason for Appointment: Follow-up for Type 2 Diabetes   History of Present Illness:          Date of diagnosis of type 2 diabetes mellitus :  1999      Past history:  He had modestly increase blood sugars at diagnosis and was started on metformin Subsequently glipizide was added to improve his control. He thinks that his blood sugars have been mostly poorly controlled over the last several years He had been on glipizide and metformin for years without any adjustment of his dosage of glipizide  Review of his A1c shows that it had ranged from 9-9.5 since 07/2013 Previously his PCP tried him on a sample of Jardiance 10 mg without significant improvement  He was started on the V-go pump on 07/02/17 because of inconsistent control and poor compliance with mealtime insulin  Recent history:   Oral hypoglycemic drugs the patient is taking are: Actos 15 mg, Jardiance 12.5 mg daily  Current INSULIN regimen: V-go pump SETTINGS: Basal rate = 30 units BOLUSES = 3-4 units at breakfast, 6-8 at lunch and  6-10 U at dinner  A1c is 7, previously was at 6.6  Fructosamine previously 225  Current blood sugar patterns and problems identified: He did bring his monitor for download but is checking very sporadically Previously was having low sugars overnight and frequently with his regimen and Jardiance was reduced to half tablet  With this now he appears to have fasting readings around 150 although has only 2 recent readings  Blood sugar after breakfast in the lab was over 200 but this was within an hour after eating  At home blood sugar was 189 after breakfast, however he did eat sausage biscuit when he had his lab work  Blood sugars around bedtime are not usually high but only 3 readings available He is somewhat more active with  walking at times but no specific program, planning to join the [employer] next year No  hypoglycemia reported now  Side effects from medications have been: Mild  diarrhea from regular metformin  Compliance with the medical regimen: fair      Glucose monitoring:  done less than once a day usually       Glucometer:   Contour   Blood sugars by download   PRE-MEAL Fasting Lunch Dinner Bedtime Overall  Glucose range: 150, 155   135-157   Mean/median:     157   POST-MEAL PC Breakfast PC Lunch PC Dinner  Glucose range: 189    Mean/median:        Previously  PRE-MEAL Fasting Lunch Dinner Bedtime Overall  Glucose range: 62-101 151  93   Mean/median: 84    115   POST-MEAL PC Breakfast PC Lunch PC Dinner  Glucose range: 153-161    Mean/median:       Previously:    Self-care: The diet that the patient has been following is: tries to limit high carbohydrate foods. eating out about 4 times a week     Typical meal intake: Breakfast is  Toast/meat .  Usually 2 servings of vegetables in the evening and avoiding potatoes and bread usually Usually avoiding drinks with sugar                Dietician visit, most recent: 05/2015  CDE visit: 01/2016  Weight history: Maximum weight 215  previously  Wt Readings from Last 3 Encounters:  11/21/21 206 lb 9.6 oz (93.7 kg)  10/17/21 205 lb (93 kg)  07/31/21 203 lb 9.6 oz (92.4 kg)    Glycemic control:    Lab Results  Component Value Date   HGBA1C 7.0 (H) 11/19/2021   HGBA1C 6.6 (H) 07/27/2021   HGBA1C 7.1 (H) 03/26/2021   Lab Results  Component Value Date   MICROALBUR 1.0 11/19/2021   LDLCALC 30 03/26/2021   CREATININE 0.91 11/19/2021    Lab on 11/19/2021  Component Date Value Ref Range Status   Microalb, Ur 11/19/2021 1.0  0.0 - 1.9 mg/dL Final   Creatinine,U 11/19/2021 75.1  mg/dL Final   Microalb Creat Ratio 11/19/2021 1.4  0.0 - 30.0 mg/g Final   Sodium 11/19/2021 138  135 - 145 mEq/L Final   Potassium 11/19/2021 4.2  3.5 - 5.1 mEq/L Final   Chloride 11/19/2021 107  96 - 112 mEq/L Final   CO2 11/19/2021 20   19 - 32 mEq/L Final   Glucose, Bld 11/19/2021 203 (H)  70 - 99 mg/dL Final   BUN 11/19/2021 19  6 - 23 mg/dL Final   Creatinine, Ser 11/19/2021 0.91  0.40 - 1.50 mg/dL Final   Total Bilirubin 11/19/2021 0.9  0.2 - 1.2 mg/dL Final   Alkaline Phosphatase 11/19/2021 65  39 - 117 U/L Final   AST 11/19/2021 24  0 - 37 U/L Final   ALT 11/19/2021 38  0 - 53 U/L Final   Total Protein 11/19/2021 6.1  6.0 - 8.3 g/dL Final   Albumin 11/19/2021 3.9  3.5 - 5.2 g/dL Final   GFR 11/19/2021 81.71  >60.00 mL/min Final   Calculated using the CKD-EPI Creatinine Equation (2021)   Calcium 11/19/2021 8.6  8.4 - 10.5 mg/dL Final   Hgb A1c MFr Bld 11/19/2021 7.0 (H)  4.6 - 6.5 % Final   Glycemic Control Guidelines for People with Diabetes:Non Diabetic:  <6%Goal of Therapy: <7%Additional Action Suggested:  >8%       Allergies as of 11/21/2021       Reactions   Atorvastatin Other (See Comments)   Codeine Other (See Comments)   Patient doesn't like how it makes him feel, "spaced out, not together"        Medication List        Accurate as of November 21, 2021  8:53 AM. If you have any questions, ask your nurse or doctor.          alfuzosin 10 MG 24 hr tablet Commonly known as: UROXATRAL Take 1 tablet (10 mg total) by mouth daily with breakfast.   aspirin EC 81 MG tablet Take 81 mg by mouth daily.   cholecalciferol 1000 units tablet Commonly known as: VITAMIN D Take 1,000 Units by mouth daily.   dutasteride 0.5 MG capsule Commonly known as: AVODART TAKE ONE CAPSULE BY MOUTH DAILY   finasteride 5 MG tablet Commonly known as: PROSCAR Take 1 tablet (5 mg total) by mouth daily.   hydroxychloroquine 200 MG tablet Commonly known as: PLAQUENIL Take 200 mg by mouth 2 (two) times daily.   Insulin Pen Needle 32G X 4 MM Misc Use Three per day to inject insulin   insulin regular 100 units/mL injection Commonly known as: NOVOLIN R Inject into the skin 3 (three) times daily before meals. USE  INSULIN TO FILL UP v-GO PUMP   Jardiance 25 MG Tabs tablet Generic drug: empagliflozin TAKE ONE TABLET BY MOUTH DAILY What  changed:  how much to take how to take this when to take this additional instructions   losartan 25 MG tablet Commonly known as: COZAAR Take 1 tablet (25 mg total) by mouth daily. What changed: additional instructions   metFORMIN 500 MG 24 hr tablet Commonly known as: GLUCOPHAGE-XR Take 1 tablet (500 mg total) by mouth daily with supper. Started by: Elayne Snare, MD   pioglitazone 15 MG tablet Commonly known as: ACTOS TAKE ONE TABLET BY MOUTH DAILY   rosuvastatin 10 MG tablet Commonly known as: Crestor Take 1 tablet (10 mg total) by mouth at bedtime.   V-Go 30 Kit Use as directed.Use to inject up to 30 units of insulin daily        Allergies:  Allergies  Allergen Reactions   Atorvastatin Other (See Comments)   Codeine Other (See Comments)    Patient doesn't like how it makes him feel, "spaced out, not together"    Past Medical History:  Diagnosis Date   Blindness of left eye 04/12/2015   HTN (hypertension)    Hypertrophy (benign) of prostate    Multinodular goiter 04/12/2015   Non-insulin dependent type 2 diabetes mellitus ([place])    Status post non-ST elevation myocardial infarction (NSTEMI)    CATH   Tobacco abuse    Type II diabetes mellitus, uncontrolled 04/12/2015    Past Surgical History:  Procedure Laterality Date   CORONARY ANGIOPLASTY WITH STENT PLACEMENT      Family History  Problem Relation Age of Onset   Diabetes Mother    Diabetes Brother    Cancer Brother    Heart disease Neg Hx     Social History:  reports that he has been smoking cigarettes. He has never used smokeless tobacco. He reports current alcohol use of about 10.0 standard drinks per week. He reports that he does not use drugs.    Review of Systems     Lipid history:   Has history of CAD and myocardial infarction  He is on 10 mg Crestor prescribed by  his cardiologist since 09/2018  LDL is below 70  Labs as follows:   Lab Results  Component Value Date   CHOL 102 03/26/2021   HDL 60.70 03/26/2021   LDLCALC 30 03/26/2021   TRIG 57.0 03/26/2021   CHOLHDL 2 03/26/2021           Eyes: He has had blindness in his left eye from old injury   His last eye exam was in 08/2020  THYROID: Previously was found to have a 2 cm nodule on his left thyroid on his exam but ultrasound in 04/2015 showed multinodular goiter with mostly small nodules; the largest nodule on the left side of 1.4 cm has the appearance of a pseudo- nodule  Thyroid levels have been normal consistently  Lab Results  Component Value Date   TSH 1.80 12/18/2020    NEUROPATHY: Has feeling of numbness on the soles of his feet which is not significant now Only occasionally may have a tingling sensation He has no numbness in the toes but he may have a little numbness in the right heel  Asking about left finger trigger finger symptoms  LABS:  Lab on 11/19/2021  Component Date Value Ref Range Status   Microalb, Ur 11/19/2021 1.0  0.0 - 1.9 mg/dL Final   Creatinine,U 11/19/2021 75.1  mg/dL Final   Microalb Creat Ratio 11/19/2021 1.4  0.0 - 30.0 mg/g Final   Sodium 11/19/2021 138  135 - 145  mEq/L Final   Potassium 11/19/2021 4.2  3.5 - 5.1 mEq/L Final   Chloride 11/19/2021 107  96 - 112 mEq/L Final   CO2 11/19/2021 20  19 - 32 mEq/L Final   Glucose, Bld 11/19/2021 203 (H)  70 - 99 mg/dL Final   BUN 11/19/2021 19  6 - 23 mg/dL Final   Creatinine, Ser 11/19/2021 0.91  0.40 - 1.50 mg/dL Final   Total Bilirubin 11/19/2021 0.9  0.2 - 1.2 mg/dL Final   Alkaline Phosphatase 11/19/2021 65  39 - 117 U/L Final   AST 11/19/2021 24  0 - 37 U/L Final   ALT 11/19/2021 38  0 - 53 U/L Final   Total Protein 11/19/2021 6.1  6.0 - 8.3 g/dL Final   Albumin 11/19/2021 3.9  3.5 - 5.2 g/dL Final   GFR 11/19/2021 81.71  >60.00 mL/min Final   Calculated using the CKD-EPI Creatinine Equation  (2021)   Calcium 11/19/2021 8.6  8.4 - 10.5 mg/dL Final   Hgb A1c MFr Bld 11/19/2021 7.0 (H)  4.6 - 6.5 % Final   Glycemic Control Guidelines for People with Diabetes:Non Diabetic:  <6%Goal of Therapy: <7%Additional Action Suggested:  >8%     Physical Examination:  BP 110/60    Pulse (!) 59    Ht 6' (1.829 m)    Wt 206 lb 9.6 oz (93.7 kg)    SpO2 96%    BMI 28.02 kg/m      ASSESSMENT:  Diabetes type 2, on insulin  See history of present illness for detailed discussion of his current management, blood sugar patterns and problems identified  A1c is slightly higher at 7% compared to 6.6  His A1c likely higher from reducing his Jardiance  As before has not monitored his blood sugars regularly Previously had been reluctant to try the freestyle libre Appears to have relatively high fasting readings recently at home from reducing Jardiance previously Although previously had been taking metformin ER he was taking 1000 mg a day and not clear if he can tolerate a small dose to bring his morning sugar down Also can do better with exercise  Mild hypertension: Blood pressure is well controlled Microalbumin normal  Symptoms of trigger finger: Discussed how this happens and he will reach out to his orthopedic surgeon  Heel numbness: This is likely to be from local factors and he can see a podiatrist if needed  Increased liver enzymes: These are normal now  PLAN:   He can take 500 mg of metformin ER at dinnertime if tolerated If he still has higher fasting readings he will go to the full tablet of the Jardiance  Regular exercise  Needs to monitor blood sugars consistently at different times and call if fasting readings unusually high or low Discussed blood sugar targets at various times Follow-up in 3 months Influenza vaccine given  Encouraged him to establish with his PCP for preventive care   Patient Instructions  Check blood sugars on waking up 3-4 days a week  Also check  blood sugars about 2 hours after meals and do this after different meals by rotation  Recommended blood sugar levels on waking up are 90-130 and about 2 hours after meal is 130-160  Please bring your blood sugar monitor to each visit, thank you     Elayne Snare 11/21/2021, 8:53 AM   Note: This office note was prepared with Dragon voice recognition system technology. Any transcriptional errors that result from this process are unintentional.

## 2021-12-27 ENCOUNTER — Other Ambulatory Visit: Payer: Self-pay | Admitting: *Deleted

## 2021-12-27 MED ORDER — LOSARTAN POTASSIUM 25 MG PO TABS: 25.0000 mg | ORAL_TABLET | Freq: Every day | ORAL | 3 refills | Status: DC

## 2022-01-07 ENCOUNTER — Ambulatory Visit: Payer: Medicare Other | Admitting: Physician Assistant

## 2022-01-07 ENCOUNTER — Other Ambulatory Visit: Payer: Medicare Other

## 2022-01-07 ENCOUNTER — Other Ambulatory Visit: Payer: Self-pay

## 2022-01-07 ENCOUNTER — Encounter: Payer: Medicare Other | Admitting: Physician Assistant

## 2022-01-07 ENCOUNTER — Other Ambulatory Visit: Payer: Self-pay | Admitting: *Deleted

## 2022-01-07 ENCOUNTER — Encounter: Payer: Self-pay | Admitting: Physician Assistant

## 2022-01-07 DIAGNOSIS — Z720 Tobacco use: Secondary | ICD-10-CM | POA: Insufficient documentation

## 2022-01-07 DIAGNOSIS — R972 Elevated prostate specific antigen [PSA]: Secondary | ICD-10-CM

## 2022-01-07 DIAGNOSIS — R001 Bradycardia, unspecified: Secondary | ICD-10-CM

## 2022-01-07 HISTORY — DX: Bradycardia, unspecified: R00.1

## 2022-01-07 MED ORDER — ROSUVASTATIN CALCIUM 10 MG PO TABS: 10.0000 mg | ORAL_TABLET | Freq: Every day | ORAL | 3 refills | Status: AC

## 2022-01-07 NOTE — Progress Notes (Signed)
This encounter was created in error - please disregard.

## 2022-01-08 LAB — PSA: Prostate Specific Ag, Serum: 11.9 ng/mL — ABNORMAL HIGH (ref 0.0–4.0)

## 2022-01-09 ENCOUNTER — Other Ambulatory Visit: Payer: Self-pay | Admitting: Endocrinology

## 2022-01-09 DIAGNOSIS — E1165 Type 2 diabetes mellitus with hyperglycemia: Secondary | ICD-10-CM

## 2022-01-09 DIAGNOSIS — Z794 Long term (current) use of insulin: Secondary | ICD-10-CM

## 2022-01-11 ENCOUNTER — Other Ambulatory Visit: Payer: Medicare Other

## 2022-01-18 ENCOUNTER — Other Ambulatory Visit: Payer: Self-pay

## 2022-01-18 ENCOUNTER — Ambulatory Visit (INDEPENDENT_AMBULATORY_CARE_PROVIDER_SITE_OTHER): Payer: Medicare Other | Admitting: Urology

## 2022-01-18 ENCOUNTER — Encounter: Payer: Self-pay | Admitting: Urology

## 2022-01-18 VITALS — BP 103/68 | HR 83 | Wt 206.2 lb

## 2022-01-18 DIAGNOSIS — N138 Other obstructive and reflux uropathy: Secondary | ICD-10-CM

## 2022-01-18 DIAGNOSIS — N401 Enlarged prostate with lower urinary tract symptoms: Secondary | ICD-10-CM | POA: Diagnosis not present

## 2022-01-18 DIAGNOSIS — R972 Elevated prostate specific antigen [PSA]: Secondary | ICD-10-CM

## 2022-01-18 DIAGNOSIS — R351 Nocturia: Secondary | ICD-10-CM

## 2022-01-18 LAB — BLADDER SCAN AMB NON-IMAGING: Scan Result: 0

## 2022-01-18 LAB — URINALYSIS, ROUTINE W REFLEX MICROSCOPIC
Bilirubin, UA: NEGATIVE
Leukocytes,UA: NEGATIVE
Nitrite, UA: NEGATIVE
Protein,UA: NEGATIVE
RBC, UA: NEGATIVE
Specific Gravity, UA: 1.015 (ref 1.005–1.030)
Urobilinogen, Ur: 0.2 mg/dL (ref 0.2–1.0)
pH, UA: 5.5 (ref 5.0–7.5)

## 2022-01-18 MED ORDER — FINASTERIDE 5 MG PO TABS: 5.0000 mg | ORAL_TABLET | Freq: Every day | ORAL | 3 refills | Status: DC

## 2022-01-18 MED ORDER — ALFUZOSIN HCL ER 10 MG PO TB24: 10.0000 mg | ORAL_TABLET | Freq: Every day | ORAL | 3 refills | Status: DC

## 2022-01-18 NOTE — Progress Notes (Signed)
01/18/2022 9:29 AM   Alan Lopez 02/02/1946 [id number]  Referring provider: Janith Lima, MD [place],  [place] [address]  Followup BPH and elevated PSA   HPI: Mr Alan Lopez is a 76yo here for followup for elevated PSA and BPH with nocturia. PSA stable at 11.9 on finasteride. IPSS 10 QOL 2 on uroxatral 62m qhs. Urine stream strong. Nocturia 2-3x. No urinary hesitancy. Occasional post void dribbling.  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   HTN (hypertension)    Hypertrophy (benign) of prostate    Multinodular goiter 04/12/2015   Non-insulin dependent type 2 diabetes mellitus (HCC)    Status post non-ST elevation myocardial infarction (NSTEMI)    CATH   Tobacco abuse    Type II diabetes mellitus, uncontrolled 04/12/2015    Surgical History: Past Surgical History:  Procedure Laterality Date   CORONARY ANGIOPLASTY WITH STENT PLACEMENT      Home Medications:  Allergies as of 01/18/2022       Reactions   Atorvastatin Other (See Comments)   Codeine Other (See Comments)   Patient doesn't like how it makes him feel, "spaced out, not together"        Medication List        Accurate as of January 18, 2022  9:29 AM. If you have any questions, ask your nurse or doctor.          acetaminophen 500 MG tablet Commonly known as: TYLENOL Take 500 mg by mouth every 6 (six) hours as needed for mild pain or moderate pain.   alfuzosin 10 MG 24 hr tablet Commonly known as: UROXATRAL Take 1 tablet (10 mg total) by mouth daily with breakfast.   aspirin EC 81 MG tablet Take 81 mg by mouth daily.   cholecalciferol 1000 units tablet Commonly known as: VITAMIN D Take 1,000 Units by mouth daily.   dutasteride 0.5 MG capsule Commonly known as: AVODART TAKE ONE CAPSULE BY MOUTH DAILY   finasteride 5 MG tablet Commonly  known as: PROSCAR Take 1 tablet (5 mg total) by mouth daily.   hydroxychloroquine 200 MG tablet Commonly known as: PLAQUENIL Take 200 mg by mouth 2 (two) times daily.   Insulin Pen Needle 32G X 4 MM Misc Use Three per day to inject insulin   insulin regular 100 units/mL injection Commonly known as: NOVOLIN R Inject into the skin 3 (three) times daily before meals. USE INSULIN TO FILL UP v-GO PUMP   Jardiance 25 MG Tabs tablet Generic drug: empagliflozin TAKE ONE TABLET BY MOUTH DAILY   losartan 25 MG tablet Commonly known as: COZAAR Take 1 tablet (25 mg total) by mouth daily.   metFORMIN 500 MG 24 hr tablet Commonly known as: GLUCOPHAGE-XR Take 1 tablet (500 mg total) by mouth daily with supper.   pioglitazone 15 MG tablet Commonly known as: ACTOS TAKE ONE TABLET BY MOUTH DAILY   rosuvastatin 10 MG tablet Commonly known as: Crestor Take 1 tablet (10 mg total) by mouth at bedtime.   V-Go 30 Kit USE AS DIRECTED TO INJECT UP TO 30 UNITS OF INSULIN DAILY        Allergies:  Allergies  Allergen Reactions   Atorvastatin Other (See Comments)   Codeine Other (See Comments)    Patient doesn't like how it makes him feel, "spaced out, not together"  Family History: Family History  Problem Relation Age of Onset   Diabetes Mother    Diabetes Brother    Cancer Brother    Heart disease Neg Hx     Social History:  reports that he has been smoking cigarettes. He has never used smokeless tobacco. He reports current alcohol use of about 10.0 standard drinks per week. He reports that he does not use drugs.  ROS: All other review of systems were reviewed and are negative except what is noted above in HPI  Physical Exam: BP 103/68    Pulse 83    Wt 206 lb 3.2 oz (93.5 kg)    BMI 27.97 kg/m   Constitutional:  Alert and oriented, No acute distress. HEENT: [place] AT, moist mucus membranes.  Trachea midline, no masses. Cardiovascular: No clubbing, cyanosis, or  edema. Respiratory: Normal respiratory effort, no increased work of breathing. GI: Abdomen is soft, nontender, nondistended, no abdominal masses GU: No CVA tenderness.  Lymph: No cervical or inguinal lymphadenopathy. Skin: No rashes, bruises or suspicious lesions. Neurologic: Grossly intact, no focal deficits, moving all 4 extremities. Psychiatric: Normal mood and affect.  Laboratory Data: Lab Results  Component Value Date   WBC 8.3 04/13/2019   HGB 14.9 04/13/2019   HCT 42.8 04/13/2019   MCV 94.9 04/13/2019   PLT 181.0 04/13/2019    Lab Results  Component Value Date   CREATININE 0.91 11/19/2021    No results found for: PSA  No results found for: TESTOSTERONE  Lab Results  Component Value Date   HGBA1C 7.0 (H) 11/19/2021    Urinalysis    Component Value Date/Time   COLORURINE YELLOW 01/29/2017 1024   APPEARANCEUR Clear 07/18/2021 0900   LABSPEC 1.020 01/29/2017 1024   PHURINE 6.0 01/29/2017 1024   GLUCOSEU 3+ (A) 07/18/2021 0900   GLUCOSEU NEGATIVE 01/29/2017 1024   HGBUR NEGATIVE 01/29/2017 1024   BILIRUBINUR Negative 07/18/2021 0900   KETONESUR NEGATIVE 01/29/2017 1024   PROTEINUR Negative 07/18/2021 0900   UROBILINOGEN 0.2 01/29/2017 1024   NITRITE Negative 07/18/2021 0900   NITRITE NEGATIVE 01/29/2017 1024   LEUKOCYTESUR Negative 07/18/2021 0900    Lab Results  Component Value Date   LABMICR See below: 07/18/2021   WBCUA None seen 07/18/2021   LABEPIT None seen 07/18/2021   MUCUS Present 07/18/2021   BACTERIA None seen 07/18/2021    Pertinent Imaging:  No results found for this or any previous visit.  No results found for this or any previous visit.  No results found for this or any previous visit.  No results found for this or any previous visit.  No results found for this or any previous visit.  No results found for this or any previous visit.  No results found for this or any previous visit.  No results found for this or any previous  visit.   Assessment & Plan:    1. Elevated PSA -Continue finasteride 106m daily and RTC 6 months with PSA - Urinalysis, Routine w reflex microscopic - BLADDER SCAN AMB NON-IMAGING  2. Benign prostatic hyperplasia with urinary obstruction -continue uroxatral 179mqhs and finasteride  3. Nocturia -continue uroxatral 1044mhs and finasteride 5mg64mily   No follow-ups on file.  [name] Bang  [facility] [place]

## 2022-01-18 NOTE — Progress Notes (Signed)
post void residual = 0 ml

## 2022-01-18 NOTE — Patient Instructions (Signed)

## 2022-01-25 ENCOUNTER — Ambulatory Visit: Payer: Medicare Other | Admitting: Physician Assistant

## 2022-02-19 ENCOUNTER — Other Ambulatory Visit (INDEPENDENT_AMBULATORY_CARE_PROVIDER_SITE_OTHER): Payer: Medicare Other

## 2022-02-19 ENCOUNTER — Other Ambulatory Visit: Payer: Self-pay

## 2022-02-19 DIAGNOSIS — Z794 Long term (current) use of insulin: Secondary | ICD-10-CM | POA: Diagnosis not present

## 2022-02-19 DIAGNOSIS — E1165 Type 2 diabetes mellitus with hyperglycemia: Secondary | ICD-10-CM

## 2022-02-19 LAB — BASIC METABOLIC PANEL
BUN: 16 mg/dL (ref 6–23)
CO2: 22 mEq/L (ref 19–32)
Calcium: 8.7 mg/dL (ref 8.4–10.5)
Chloride: 108 mEq/L (ref 96–112)
Creatinine, Ser: 0.96 mg/dL (ref 0.40–1.50)
GFR: 76.5 mL/min (ref >60.00)
Glucose, Bld: 219 mg/dL — ABNORMAL HIGH (ref 70–99)
Potassium: 4.2 mEq/L (ref 3.5–5.1)
Sodium: 139 mEq/L (ref 135–145)

## 2022-02-19 LAB — HEMOGLOBIN A1C: Hgb A1c MFr Bld: 7.1 % — ABNORMAL HIGH (ref 4.6–6.5)

## 2022-02-21 ENCOUNTER — Other Ambulatory Visit: Payer: Self-pay

## 2022-02-21 ENCOUNTER — Encounter: Payer: Self-pay | Admitting: Endocrinology

## 2022-02-21 ENCOUNTER — Ambulatory Visit (INDEPENDENT_AMBULATORY_CARE_PROVIDER_SITE_OTHER): Payer: Medicare Other | Admitting: Endocrinology

## 2022-02-21 VITALS — BP 108/64 | HR 80 | Ht 72.0 in | Wt 209.0 lb

## 2022-02-21 DIAGNOSIS — I10 Essential (primary) hypertension: Secondary | ICD-10-CM | POA: Diagnosis not present

## 2022-02-21 DIAGNOSIS — E1165 Type 2 diabetes mellitus with hyperglycemia: Secondary | ICD-10-CM

## 2022-02-21 DIAGNOSIS — Z794 Long term (current) use of insulin: Secondary | ICD-10-CM | POA: Diagnosis not present

## 2022-02-21 NOTE — Progress Notes (Signed)
Patient ID: Alan Lopez, male   DOB: [birth year], 76 y.o.   MRN: [id number] ? ?       ? ? ? ?Reason for Appointment: Follow-up for Type 2 Diabetes ? ? ?History of Present Illness:  ?        ?Date of diagnosis of type 2 diabetes mellitus :  1999     ? ?Past history:  ?He had modestly increase blood sugars at diagnosis and was started on metformin ?Subsequently glipizide was added to improve his control. ?He thinks that his blood sugars have been mostly poorly controlled over the last several years ?He had been on glipizide and metformin for years without any adjustment of his dosage of glipizide ? ?Review of his A1c shows that it had ranged from 9-9.5 since 07/2013 ?Previously his PCP tried him on a sample of Jardiance 10 mg without significant improvement ? ?He was started on the V-go pump on 07/02/17 because of inconsistent control and poor compliance with mealtime insulin ? ?Recent history:  ? ?Oral hypoglycemic drugs the patient is taking are: Actos 15 mg, Jardiance 12.5 mg daily ? ?Current INSULIN regimen: V-go pump SETTINGS: Basal rate = 30 units ?BOLUSES = 3-4 units at breakfast, 6-8 at lunch and  6-10 U at dinner ? ?A1c is 7.1, previously was better in 8/22 at 6.6 ? ?Fructosamine previously 225 ? ?Current blood sugar patterns and problems identified: ?He did bring his monitor for review but as before checking very sporadically ?He appears to have relatively high fasting readings again although yesterday fasting reading was 113  ?This is despite starting with trial of METFORMIN 1 tablet a day ?However he thinks this is causing some diarrhea  ?Partly because of cost his Vania Rea was reduced to half tablet  ?Lab fasting glucose was not done and postprandial reading was 219  ?Recently highest blood sugar at home is 172 fasting ?Has only 2 readings in the evenings after dinner which were 138 and 165  ?We will monitoring after lunch and recently blood sugars after breakfast appear to be fairly good ?He was  planning to join the [facility] but has not done so, he says he is just busy with yard work ?No hypoglycemia reported overnight ? ?Side effects from medications have been: Mild  diarrhea from regular metformin ? ?Compliance with the medical regimen: fair   ?  ?Meals 8, am pm 6-7 ? ?Glucose monitoring:  done less than once a day usually       Glucometer:   Contour  ? ?Blood sugars by download as above, overall AVERAGE 147 ? ? ?Previously: ? ?PRE-MEAL Fasting Lunch Dinner Bedtime Overall  ?Glucose range: 150, 155   135-157   ?Mean/median:     157  ? ?POST-MEAL PC Breakfast PC Lunch PC Dinner  ?Glucose range: 189    ?Mean/median:     ? ? ? ? ?Previously: ?  ? Self-care: The diet that the patient has been following is: tries to limit high carbohydrate foods. eating out about 4 times a week     ?Typical meal intake: Breakfast is  Toast/meat .  Usually 2 servings of vegetables in the evening and avoiding potatoes and bread usually ?Usually avoiding drinks with sugar  ?              ?Dietician visit, most recent: 05/2015 ? ?CDE visit: 01/2016 ? ?Weight history: Maximum weight 215 previously ? ?Wt Readings from Last 3 Encounters:  ?02/21/22 209 lb (94.8 kg)  ?01/18/22 206 lb  3.2 oz (93.5 kg)  ?01/07/22 204 lb 6.4 oz (92.7 kg)  ? ? ?Glycemic control: ?   ?Lab Results  ?Component Value Date  ? HGBA1C 7.1 (H) 02/19/2022  ? HGBA1C 7.0 (H) 11/19/2021  ? HGBA1C 6.6 (H) 07/27/2021  ? ?Lab Results  ?Component Value Date  ? MICROALBUR 1.0 11/19/2021  ? [place] 30 03/26/2021  ? CREATININE 0.96 02/19/2022  ? ? ?Lab on 02/19/2022  ?Component Date Value Ref Range Status  ? Sodium 02/19/2022 139  135 - 145 mEq/L Final  ? Potassium 02/19/2022 4.2  3.5 - 5.1 mEq/L Final  ? Chloride 02/19/2022 108  96 - 112 mEq/L Final  ? CO2 02/19/2022 22  19 - 32 mEq/L Final  ? Glucose, Bld 02/19/2022 219 (H)  70 - 99 mg/dL Final  ? BUN 02/19/2022 16  6 - 23 mg/dL Final  ? Creatinine, Ser 02/19/2022 0.96  0.40 - 1.50 mg/dL Final  ? GFR 02/19/2022 76.50  >60.00  mL/min Final  ? Calculated using the CKD-EPI Creatinine Equation (2021)  ? Calcium 02/19/2022 8.7  8.4 - 10.5 mg/dL Final  ? Hgb A1c MFr Bld 02/19/2022 7.1 (H)  4.6 - 6.5 % Final  ? Glycemic Control Guidelines for People with Diabetes:Non Diabetic:  <6%Goal of Therapy: <7%Additional Action Suggested:  >8%   ? ?Other active problems: See review of systems ? ? ?Allergies as of 02/21/2022   ? ?   Reactions  ? Atorvastatin Other (See Comments)  ? Codeine Other (See Comments)  ? Patient doesn't like how it makes him feel, "spaced out, not together"  ? ?  ? ?  ?Medication List  ?  ? ?  ? Accurate as of February 21, 2022  9:36 PM. If you have any questions, ask your nurse or doctor.  ?  ?  ? ?  ? ?acetaminophen 500 MG tablet ?Commonly known as: TYLENOL ?Take 500 mg by mouth every 6 (six) hours as needed for mild pain or moderate pain. ?  ?alfuzosin 10 MG 24 hr tablet ?Commonly known as: UROXATRAL ?Take 1 tablet (10 mg total) by mouth daily with breakfast. ?  ?aspirin EC 81 MG tablet ?Take 81 mg by mouth daily. ?  ?cholecalciferol 1000 units tablet ?Commonly known as: VITAMIN D ?Take 1,000 Units by mouth daily. ?  ?dutasteride 0.5 MG capsule ?Commonly known as: AVODART ?TAKE ONE CAPSULE BY MOUTH DAILY ?  ?finasteride 5 MG tablet ?Commonly known as: PROSCAR ?Take 1 tablet (5 mg total) by mouth daily. ?  ?hydroxychloroquine 200 MG tablet ?Commonly known as: PLAQUENIL ?Take 200 mg by mouth 2 (two) times daily. ?  ?Insulin Pen Needle 32G X 4 MM Misc ?Use Three per day to inject insulin ?  ?insulin regular 100 units/mL injection ?Commonly known as: NOVOLIN R ?Inject into the skin 3 (three) times daily before meals. USE INSULIN TO FILL UP v-GO PUMP ?  ?Jardiance 25 MG Tabs tablet ?Generic drug: empagliflozin ?TAKE ONE TABLET BY MOUTH DAILY ?  ?losartan 25 MG tablet ?Commonly known as: COZAAR ?Take 1 tablet (25 mg total) by mouth daily. ?  ?metFORMIN 500 MG 24 hr tablet ?Commonly known as: GLUCOPHAGE-XR ?Take 1 tablet (500 mg total)  by mouth daily with supper. ?  ?pioglitazone 15 MG tablet ?Commonly known as: ACTOS ?TAKE ONE TABLET BY MOUTH DAILY ?  ?rosuvastatin 10 MG tablet ?Commonly known as: Crestor ?Take 1 tablet (10 mg total) by mouth at bedtime. ?  ?V-Go 30 Kit ?USE AS DIRECTED TO INJECT  UP TO 30 UNITS OF INSULIN DAILY ?  ? ?  ? ? ?Allergies:  ?Allergies  ?Allergen Reactions  ? Atorvastatin Other (See Comments)  ? Codeine Other (See Comments)  ?  Patient doesn't like how it makes him feel, "spaced out, not together"  ? ? ?Past Medical History:  ?Diagnosis Date  ? 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 Bradycardia 01/07/2022  ? Beta-blocker DC'd due to bradycardia.  ? HTN (hypertension)   ? Hypertrophy (benign) of prostate   ? Multinodular goiter 04/12/2015  ? Non-insulin dependent type 2 diabetes mellitus ([place])   ? Status post non-ST elevation myocardial infarction (NSTEMI)   ? CATH  ? Tobacco abuse   ? Type II diabetes mellitus, uncontrolled 04/12/2015  ? ? ?Past Surgical History:  ?Procedure Laterality Date  ? CORONARY ANGIOPLASTY WITH STENT PLACEMENT    ? ? ?Family History  ?Problem Relation Age of Onset  ? Diabetes Mother   ? Diabetes Brother   ? Cancer Brother   ? Heart disease Neg Hx   ? ? ?Social History:  reports that he has been smoking cigarettes. He has never used smokeless tobacco. He reports current alcohol use of about 10.0 standard drinks per week. He reports that he does not use drugs. ? ?  ?Review of Systems  ?  ? ?Lipid history:  ? ?Has history of CAD and myocardial infarction ? ?He is on 10 mg Crestor prescribed by his cardiologist since 09/2018 ? ?LDL is below 70 ? ?Labs as follows: ?  ?Lab Results  ?Component Value Date  ? CHOL 102 03/26/2021  ? HDL 60.70 03/26/2021  ? [place] 30 03/26/2021  ? TRIG 57.0 03/26/2021  ? CHOLHDL 2 03/26/2021  ?        ? 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 ?His last eye exam was in 08/2020 ? ?THYROID: ?Previously was found to have a 2 cm nodule on his left thyroid on his  exam but ultrasound in 04/2015 showed multinodular goiter with mostly small nodules; the largest nodule on the left side of 1.4 cm has the appearance of a pseudo- nodule ? ?Thyroid levels have been normal consist

## 2022-02-21 NOTE — Patient Instructions (Addendum)
Take 1 tab Jardiance ? ?Check blood sugars on waking up 4 days a week ? ?Also check blood sugars about 2 hours after meals and do this after different meals by rotation ? ?Recommended blood sugar levels on waking up are 90-130 and about 2 hours after meal is 130-160 ? ?Please bring your blood sugar monitor to each visit, thank you ? ?

## 2022-02-28 DIAGNOSIS — E119 Type 2 diabetes mellitus without complications: Secondary | ICD-10-CM | POA: Diagnosis not present

## 2022-02-28 DIAGNOSIS — H5203 Hypermetropia, bilateral: Secondary | ICD-10-CM | POA: Diagnosis not present

## 2022-02-28 LAB — HM DIABETES EYE EXAM

## 2022-03-18 ENCOUNTER — Other Ambulatory Visit: Payer: Self-pay | Admitting: Endocrinology

## 2022-03-18 MED ORDER — EMPAGLIFLOZIN 25 MG PO TABS: 25.0000 mg | ORAL_TABLET | Freq: Every day | ORAL | 0 refills | Status: DC

## 2022-03-24 ENCOUNTER — Other Ambulatory Visit: Payer: Self-pay | Admitting: Endocrinology

## 2022-04-15 ENCOUNTER — Other Ambulatory Visit: Payer: Self-pay | Admitting: Endocrinology

## 2022-04-19 ENCOUNTER — Other Ambulatory Visit: Payer: Self-pay

## 2022-04-19 DIAGNOSIS — E1165 Type 2 diabetes mellitus with hyperglycemia: Secondary | ICD-10-CM

## 2022-04-19 MED ORDER — V-GO 30 30 UNIT/24HR KIT: 30.0000 [IU] | PACK | Freq: Every day | 3 refills | Status: DC

## 2022-05-15 DIAGNOSIS — L570 Actinic keratosis: Secondary | ICD-10-CM | POA: Diagnosis not present

## 2022-05-15 DIAGNOSIS — L920 Granuloma annulare: Secondary | ICD-10-CM | POA: Diagnosis not present

## 2022-06-04 ENCOUNTER — Other Ambulatory Visit (INDEPENDENT_AMBULATORY_CARE_PROVIDER_SITE_OTHER): Payer: Medicare Other

## 2022-06-04 DIAGNOSIS — E1165 Type 2 diabetes mellitus with hyperglycemia: Secondary | ICD-10-CM | POA: Diagnosis not present

## 2022-06-04 DIAGNOSIS — Z794 Long term (current) use of insulin: Secondary | ICD-10-CM | POA: Diagnosis not present

## 2022-06-04 LAB — BASIC METABOLIC PANEL
BUN: 17 mg/dL (ref 6–23)
CO2: 24 mEq/L (ref 19–32)
Calcium: 8.9 mg/dL (ref 8.4–10.5)
Chloride: 107 mEq/L (ref 96–112)
Creatinine, Ser: 0.92 mg/dL (ref 0.40–1.50)
GFR: 80.34 mL/min (ref >60.00)
Glucose, Bld: 90 mg/dL (ref 70–99)
Potassium: 4 mEq/L (ref 3.5–5.1)
Sodium: 139 mEq/L (ref 135–145)

## 2022-06-04 LAB — HEMOGLOBIN A1C: Hgb A1c MFr Bld: 6.3 % (ref 4.6–6.5)

## 2022-06-06 ENCOUNTER — Encounter: Payer: Self-pay | Admitting: Endocrinology

## 2022-06-06 ENCOUNTER — Ambulatory Visit (INDEPENDENT_AMBULATORY_CARE_PROVIDER_SITE_OTHER): Payer: Medicare Other | Admitting: Endocrinology

## 2022-06-06 VITALS — BP 128/62 | HR 81 | Ht 72.0 in | Wt 207.2 lb

## 2022-06-06 DIAGNOSIS — E1142 Type 2 diabetes mellitus with diabetic polyneuropathy: Secondary | ICD-10-CM

## 2022-06-06 DIAGNOSIS — I10 Essential (primary) hypertension: Secondary | ICD-10-CM

## 2022-06-06 DIAGNOSIS — E7800 Pure hypercholesterolemia, unspecified: Secondary | ICD-10-CM

## 2022-06-06 NOTE — Progress Notes (Unsigned)
Patient ID: Alan Lopez, male   DOB: November 22, 1946, 76 y.o.   MRN: [id number]            Reason for Appointment: Follow-up for Type 2 Diabetes   History of Present Illness:          Date of diagnosis of type 2 diabetes mellitus :  1999      Past history:  He had modestly increase blood sugars at diagnosis and was started on metformin Subsequently glipizide was added to improve his control. He thinks that his blood sugars have been mostly poorly controlled over the last several years He had been on glipizide and metformin for years without any adjustment of his dosage of glipizide  Review of his A1c shows that it had ranged from 9-9.5 since 07/2013 Previously his PCP tried him on a sample of Jardiance 10 mg without significant improvement  He was started on the V-go pump on 07/02/17 because of inconsistent control and poor compliance with mealtime insulin  Recent history:   Oral hypoglycemic drugs the patient is taking are: Actos 15 mg, Jardiance 25 mg daily  Current INSULIN regimen: V-go pump SETTINGS: Basal rate = 30 units BOLUSES = 3-4 units at breakfast, 6-8 at lunch and  6-10 U at dinner  A1c is 7.1, previously was better in 8/22 at 6.6  Fructosamine previously 225  Current blood sugar patterns and problems identified: He does appear to have better blood sugar control with increasing Jardiance to 25 mg Also likely more active outside with yard work Previously weight was increasing and now down 2 pounds As before however he is inconsistent with his monitoring and likely started checking blood sugars only about a week ago for this month He generally has excellent blood sugars in the mornings now has only 1 reading over 150 nonfasting after lunch Fasting lab glucose 90 No hypoglycemia reported   Side effects from medications have been: Mild  diarrhea from regular metformin    Meals 8, am pm 6-7  Glucose monitoring:  done less than once a day usually       Glucometer:    Contour   Blood sugars by download  AVERAGE in the morning 100 Nonfasting readings 123-245 with only 4 readings, after lunch or dinner Overall AVERAGE 131     Previously:    Self-care: The diet that the patient has been following is: tries to limit high carbohydrate foods. eating out about 4 times a week      Typical meal intake: Breakfast is  Toast/meat .  Usually 2 servings of vegetables in the evening and avoiding potatoes and bread usually Usually avoiding drinks with sugar                Dietician visit, most recent: 05/2015  CDE visit: 01/2016  Weight history: Maximum weight 215 previously  Wt Readings from Last 3 Encounters:  06/06/22 207 lb 3.2 oz (94 kg)  02/21/22 209 lb (94.8 kg)  01/18/22 206 lb 3.2 oz (93.5 kg)    Glycemic control:    Lab Results  Component Value Date   HGBA1C 6.3 06/04/2022   HGBA1C 7.1 (H) 02/19/2022   HGBA1C 7.0 (H) 11/19/2021   Lab Results  Component Value Date   MICROALBUR 1.0 11/19/2021   LDLCALC 30 03/26/2021   CREATININE 0.92 06/04/2022    Lab on 06/04/2022  Component Date Value Ref Range Status   Sodium 06/04/2022 139  135 - 145 mEq/L Final   Potassium 06/04/2022 4.0  3.5 - 5.1 mEq/L Final   Chloride 06/04/2022 107  96 - 112 mEq/L Final   CO2 06/04/2022 24  19 - 32 mEq/L Final   Glucose, Bld 06/04/2022 90  70 - 99 mg/dL Final   BUN 06/04/2022 17  6 - 23 mg/dL Final   Creatinine, Ser 06/04/2022 0.92  0.40 - 1.50 mg/dL Final   GFR 06/04/2022 80.34  >60.00 mL/min Final   Calculated using the CKD-EPI Creatinine Equation (2021)   Calcium 06/04/2022 8.9  8.4 - 10.5 mg/dL Final   Hgb A1c MFr Bld 06/04/2022 6.3  4.6 - 6.5 % Final   Glycemic Control Guidelines for People with Diabetes:Non Diabetic:  <6%Goal of Therapy: <7%Additional Action Suggested:  >8%    Other active problems: See review of systems   Allergies as of 06/06/2022       Reactions   Atorvastatin Other (See Comments)   Codeine Other (See Comments)   Patient  doesn't like how it makes him feel, "spaced out, not together"        Medication List        Accurate as of June 06, 2022 11:59 PM. If you have any questions, ask your nurse or doctor.          acetaminophen 500 MG tablet Commonly known as: TYLENOL Take 500 mg by mouth every 6 (six) hours as needed for mild pain or moderate pain.   alfuzosin 10 MG 24 hr tablet Commonly known as: UROXATRAL Take 1 tablet (10 mg total) by mouth daily with breakfast.   aspirin EC 81 MG tablet Take 81 mg by mouth daily.   cholecalciferol 1000 units tablet Commonly known as: VITAMIN D Take 1,000 Units by mouth daily.   dutasteride 0.5 MG capsule Commonly known as: AVODART TAKE ONE CAPSULE BY MOUTH DAILY   finasteride 5 MG tablet Commonly known as: PROSCAR Take 1 tablet (5 mg total) by mouth daily.   hydroxychloroquine 200 MG tablet Commonly known as: PLAQUENIL Take 200 mg by mouth 2 (two) times daily.   Insulin Pen Needle 32G X 4 MM Misc Use Three per day to inject insulin   insulin regular 100 units/mL injection Commonly known as: NOVOLIN R Inject into the skin 3 (three) times daily before meals. USE INSULIN TO FILL UP v-GO PUMP   Jardiance 25 MG Tabs tablet Generic drug: empagliflozin TAKE ONE TABLET BY MOUTH DAILY   losartan 25 MG tablet Commonly known as: COZAAR Take 1 tablet (25 mg total) by mouth daily.   metFORMIN 500 MG 24 hr tablet Commonly known as: GLUCOPHAGE-XR Take 1 tablet (500 mg total) by mouth daily with supper.   pioglitazone 15 MG tablet Commonly known as: ACTOS TAKE ONE TABLET BY MOUTH DAILY   rosuvastatin 10 MG tablet Commonly known as: Crestor Take 1 tablet (10 mg total) by mouth at bedtime.   V-Go 30 30 UNIT/24HR Kit Inject 30 Units into the skin daily.        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   HTN (hypertension)    Hypertrophy (benign) of prostate    Multinodular goiter 04/12/2015   Non-insulin dependent type 2 diabetes mellitus ([place])  Status post non-ST elevation myocardial infarction (NSTEMI)    CATH   Tobacco abuse    Type II diabetes mellitus, uncontrolled 04/12/2015    Past Surgical History:  Procedure Laterality Date   CORONARY ANGIOPLASTY WITH STENT PLACEMENT      Family History  Problem Relation Age of Onset   Diabetes Mother    Diabetes Brother    Cancer Brother    Heart disease Neg Hx     Social History:  reports that he has been smoking cigarettes. He has never used smokeless tobacco. He reports current alcohol use of about 10.0 standard drinks of alcohol per week. He reports that he does not use drugs.    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   His last eye exam was in 08/2020  THYROID: Previously was found to have a 2 cm nodule on his left thyroid on his exam but ultrasound in 04/2015 showed multinodular goiter with mostly small nodules; the largest nodule on the left side of 1.4 cm has the appearance of a pseudo- nodule  Thyroid levels have been normal consistently  Lab Results  Component Value Date   TSH 1.80 12/18/2020    NEUROPATHY: Has feeling of numbness on the soles of his feet which is not significant now Only occasionally may have a tingling sensation  Blood pressure management: He is only on 25 mg losartan from his  cardiologist Also on Jardiance 25 mg  BP Readings from Last 3 Encounters:  06/06/22 128/62  02/21/22 108/64  01/18/22 103/68   No recent change in renal function  Lab Results  Component Value Date   CREATININE 0.92 06/04/2022   CREATININE 0.96 02/19/2022   CREATININE 0.91 11/19/2021     LABS:  Lab on 06/04/2022  Component Date Value Ref Range Status   Sodium 06/04/2022 139  135 - 145 mEq/L Final   Potassium 06/04/2022 4.0  3.5 - 5.1 mEq/L Final   Chloride 06/04/2022 107  96 - 112 mEq/L Final   CO2 06/04/2022 24  19 - 32 mEq/L Final   Glucose, Bld 06/04/2022 90  70 - 99 mg/dL Final   BUN 06/04/2022 17  6 - 23 mg/dL Final   Creatinine, Ser 06/04/2022 0.92  0.40 - 1.50 mg/dL Final   GFR 06/04/2022 80.34  >60.00 mL/min Final   Calculated using the CKD-EPI Creatinine Equation (2021)   Calcium 06/04/2022 8.9  8.4 - 10.5 mg/dL Final   Hgb A1c MFr Bld 06/04/2022 6.3  4.6 - 6.5 % Final   Glycemic Control Guidelines for People with Diabetes:Non Diabetic:  <6%Goal of Therapy: <7%Additional Action Suggested:  >8%     Physical Examination:  BP 128/62   Pulse 81   Ht 6' (1.829 m)   Wt 207 lb 3.2 oz (94 kg)   SpO2 92%   BMI 28.10 kg/m      ASSESSMENT:  Diabetes type 2, on insulin  See history of present illness for detailed discussion of his current management, blood sugar patterns and  problems identified  A1c is improved at 6.3  Current treatment regimen: Basal bolus insulin with V-go pump 30 units basal, Actos 15 mg and Jardiance 25 mg daily As above with his increasing Jardiance as well as generally being more active and losing a little weight his blood sugar control appears to be excellent without hypoglycemia Has only 1 unusually high reading recently but monitoring is inadequate Likely will not check blood sugars sufficiently if he is started on the libre 2 sensor  Mild hypertension: Blood pressure is consistently controlled with losartan and Jardiance  Renal  function stable also  Lipids: Needs follow-up with his cardiologist, however will order lipid panel on the next visit for follow-up   PLAN:   No change in current medications or insulin regimen However needs to check blood sugars more consistently including after meals  Call if having more than occasional low blood sugars Follow-up in 4 months     There are no Patient Instructions on file for this visit.     Elayne Snare 06/10/2022, 10:53 AM   Note: This office note was prepared with Dragon voice recognition system technology. Any transcriptional errors that result from this process are unintentional.

## 2022-06-12 ENCOUNTER — Other Ambulatory Visit: Payer: Self-pay | Admitting: Endocrinology

## 2022-07-05 ENCOUNTER — Other Ambulatory Visit: Payer: Medicare Other

## 2022-07-05 DIAGNOSIS — R972 Elevated prostate specific antigen [PSA]: Secondary | ICD-10-CM

## 2022-07-06 LAB — PSA: Prostate Specific Ag, Serum: 10.8 ng/mL — ABNORMAL HIGH (ref 0.0–4.0)

## 2022-07-12 ENCOUNTER — Ambulatory Visit (INDEPENDENT_AMBULATORY_CARE_PROVIDER_SITE_OTHER): Payer: Medicare Other | Admitting: Urology

## 2022-07-12 ENCOUNTER — Encounter: Payer: Self-pay | Admitting: Urology

## 2022-07-12 ENCOUNTER — Other Ambulatory Visit: Payer: Medicare Other

## 2022-07-12 VITALS — BP 164/82 | HR 73

## 2022-07-12 DIAGNOSIS — N138 Other obstructive and reflux uropathy: Secondary | ICD-10-CM

## 2022-07-12 DIAGNOSIS — R351 Nocturia: Secondary | ICD-10-CM

## 2022-07-12 DIAGNOSIS — N401 Enlarged prostate with lower urinary tract symptoms: Secondary | ICD-10-CM

## 2022-07-12 DIAGNOSIS — R972 Elevated prostate specific antigen [PSA]: Secondary | ICD-10-CM

## 2022-07-12 MED ORDER — ALFUZOSIN HCL ER 10 MG PO TB24: 10.0000 mg | ORAL_TABLET | Freq: Every day | ORAL | 3 refills | Status: DC

## 2022-07-12 MED ORDER — FINASTERIDE 5 MG PO TABS: 5.0000 mg | ORAL_TABLET | Freq: Every day | ORAL | 3 refills | Status: DC

## 2022-07-12 NOTE — Patient Instructions (Signed)

## 2022-07-12 NOTE — Progress Notes (Signed)
07/12/2022 12:35 PM   Alan Lopez November 02, 1946 [id number]  Referring provider: Janith Lima, MD [place],   [id number]  Followup elevated PSA   HPI: Mr Kolodziejski is a 75yo here for followup for elevated PSA and BPH with nocturia. PSA decreased to 10.8 from 11.9. IPSS 11 QOL 2 on uroxatral 78m. Nocturia 3x which stable. He drinks 16-30oz within 2 hours of going to bed. No straining to urinate. 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   HTN (hypertension)    Hypertrophy (benign) of prostate    Multinodular goiter 04/12/2015   Non-insulin dependent type 2 diabetes mellitus ([facility])    Status post non-ST elevation myocardial infarction (NSTEMI)    CATH   Tobacco abuse    Type II diabetes mellitus, uncontrolled 04/12/2015    Surgical History: Past Surgical History:  Procedure Laterality Date   CORONARY ANGIOPLASTY WITH STENT PLACEMENT      Home Medications:  Allergies as of 07/12/2022       Reactions   Atorvastatin Other (See Comments)   Codeine Other (See Comments)   Patient doesn't like how it makes him feel, "spaced out, not together"        Medication List        Accurate as of July 12, 2022 12:35 PM. If you have any questions, ask your nurse or doctor.          acetaminophen 500 MG tablet Commonly known as: TYLENOL Take 500 mg by mouth every 6 (six) hours as needed for mild pain or moderate pain.   alfuzosin 10 MG 24 hr tablet Commonly known as: UROXATRAL Take 1 tablet (10 mg total) by mouth daily with breakfast.   aspirin EC 81 MG tablet Take 81 mg by mouth daily.   cholecalciferol 1000 units tablet Commonly known as: VITAMIN D Take 1,000 Units by mouth daily.   dutasteride 0.5 MG capsule Commonly known as: AVODART TAKE ONE CAPSULE BY MOUTH DAILY   finasteride 5 MG tablet Commonly known as:  PROSCAR Take 1 tablet (5 mg total) by mouth daily.   hydroxychloroquine 200 MG tablet Commonly known as: PLAQUENIL Take 200 mg by mouth 2 (two) times daily.   Insulin Pen Needle 32G X 4 MM Misc Use Three per day to inject insulin   insulin regular 100 units/mL injection Commonly known as: NOVOLIN R Inject into the skin 3 (three) times daily before meals. USE INSULIN TO FILL UP v-GO PUMP   Jardiance 25 MG Tabs tablet Generic drug: empagliflozin TAKE ONE TABLET BY MOUTH DAILY   losartan 25 MG tablet Commonly known as: COZAAR Take 1 tablet (25 mg total) by mouth daily.   metFORMIN 500 MG 24 hr tablet Commonly known as: GLUCOPHAGE-XR Take 1 tablet (500 mg total) by mouth daily with supper.   pioglitazone 15 MG tablet Commonly known as: ACTOS TAKE ONE TABLET BY MOUTH DAILY   rosuvastatin 10 MG tablet Commonly known as: Crestor Take 1 tablet (10 mg total) by mouth at bedtime.   V-Go 30 30 UNIT/24HR Kit Inject 30 Units into the skin daily.        Allergies:  Allergies  Allergen Reactions   Atorvastatin Other (See Comments)   Codeine Other (See Comments)    Patient doesn't like how it makes him feel, "spaced out, not together"    Family History:  Family History  Problem Relation Age of Onset   Diabetes Mother    Diabetes Brother    Cancer Brother    Heart disease Neg Hx     Social History:  reports that he has been smoking cigarettes. He has never used smokeless tobacco. He reports current alcohol use of about 10.0 standard drinks of alcohol per week. He reports that he does not use drugs.  ROS: All other review of systems were reviewed and are negative except what is noted above in HPI  Physical Exam: BP (!) 164/82   Pulse 73   Constitutional:  Alert and oriented, No acute distress. HEENT: [place] AT, moist mucus membranes.  Trachea midline, no masses. Cardiovascular: No clubbing, cyanosis, or edema. Respiratory: Normal respiratory effort, no increased work of  breathing. GI: Abdomen is soft, nontender, nondistended, no abdominal masses GU: No CVA tenderness.  Lymph: No cervical or inguinal lymphadenopathy. Skin: No rashes, bruises or suspicious lesions. Neurologic: Grossly intact, no focal deficits, moving all 4 extremities. Psychiatric: Normal mood and affect.  Laboratory Data: Lab Results  Component Value Date   WBC 8.3 04/13/2019   HGB 14.9 04/13/2019   HCT 42.8 04/13/2019   MCV 94.9 04/13/2019   PLT 181.0 04/13/2019    Lab Results  Component Value Date   CREATININE 0.92 06/04/2022    No results found for: "PSA"  No results found for: "TESTOSTERONE"  Lab Results  Component Value Date   HGBA1C 6.3 06/04/2022    Urinalysis    Component Value Date/Time   COLORURINE YELLOW 01/29/2017 1024   APPEARANCEUR Clear 01/18/2022 1105   LABSPEC 1.020 01/29/2017 1024   PHURINE 6.0 01/29/2017 1024   GLUCOSEU 2+ (A) 01/18/2022 1105   GLUCOSEU NEGATIVE 01/29/2017 1024   HGBUR NEGATIVE 01/29/2017 1024   BILIRUBINUR Negative 01/18/2022 1105   KETONESUR NEGATIVE 01/29/2017 1024   PROTEINUR Negative 01/18/2022 1105   UROBILINOGEN 0.2 01/29/2017 1024   NITRITE Negative 01/18/2022 1105   NITRITE NEGATIVE 01/29/2017 1024   LEUKOCYTESUR Negative 01/18/2022 1105    Lab Results  Component Value Date   LABMICR Comment 01/18/2022   WBCUA None seen 07/18/2021   LABEPIT None seen 07/18/2021   MUCUS Present 07/18/2021   BACTERIA None seen 07/18/2021    Pertinent Imaging:  No results found for this or any previous visit.  No results found for this or any previous visit.  No results found for this or any previous visit.  No results found for this or any previous visit.  No results found for this or any previous visit.  No results found for this or any previous visit.  No results found for this or any previous visit.  No results found for this or any previous visit.   Assessment & Plan:    1. Elevated PSA -RTC 6 months with  PSA  2. Benign prostatic hyperplasia with urinary obstruction -uroxatral 18m qhs and finasteride 537mdaily  3. Nocturia -continue uroxatral 1027mnd finasteride 5mg67m No follow-ups on file.  [name] Bang  [facility] [place]

## 2022-07-15 ENCOUNTER — Emergency Department (HOSPITAL_BASED_OUTPATIENT_CLINIC_OR_DEPARTMENT_OTHER)
Admission: EM | Admit: 2022-07-15 | Discharge: 2022-07-15 | Disposition: A | Discharge status: Home / Self Care | Payer: Medicare Other | Attending: Emergency Medicine | Admitting: Emergency Medicine

## 2022-07-15 ENCOUNTER — Emergency Department (HOSPITAL_BASED_OUTPATIENT_CLINIC_OR_DEPARTMENT_OTHER): Payer: Medicare Other | Admitting: Radiology

## 2022-07-15 ENCOUNTER — Other Ambulatory Visit: Payer: Self-pay

## 2022-07-15 ENCOUNTER — Emergency Department (HOSPITAL_BASED_OUTPATIENT_CLINIC_OR_DEPARTMENT_OTHER): Payer: Medicare Other

## 2022-07-15 ENCOUNTER — Encounter (HOSPITAL_BASED_OUTPATIENT_CLINIC_OR_DEPARTMENT_OTHER): Payer: Self-pay | Admitting: Obstetrics and Gynecology

## 2022-07-15 DIAGNOSIS — Z794 Long term (current) use of insulin: Secondary | ICD-10-CM | POA: Insufficient documentation

## 2022-07-15 DIAGNOSIS — S92314A Nondisplaced fracture of first metatarsal bone, right foot, initial encounter for closed fracture: Secondary | ICD-10-CM | POA: Diagnosis not present

## 2022-07-15 DIAGNOSIS — W19XXXA Unspecified fall, initial encounter: Secondary | ICD-10-CM | POA: Diagnosis not present

## 2022-07-15 DIAGNOSIS — Z7984 Long term (current) use of oral hypoglycemic drugs: Secondary | ICD-10-CM | POA: Diagnosis not present

## 2022-07-15 DIAGNOSIS — M25532 Pain in left wrist: Secondary | ICD-10-CM | POA: Insufficient documentation

## 2022-07-15 DIAGNOSIS — S6292XA Unspecified fracture of left wrist and hand, initial encounter for closed fracture: Secondary | ICD-10-CM | POA: Diagnosis not present

## 2022-07-15 DIAGNOSIS — Z79899 Other long term (current) drug therapy: Secondary | ICD-10-CM | POA: Diagnosis not present

## 2022-07-15 DIAGNOSIS — S6992XA Unspecified injury of left wrist, hand and finger(s), initial encounter: Secondary | ICD-10-CM | POA: Diagnosis present

## 2022-07-15 DIAGNOSIS — Z7982 Long term (current) use of aspirin: Secondary | ICD-10-CM | POA: Diagnosis not present

## 2022-07-15 DIAGNOSIS — S62102A Fracture of unspecified carpal bone, left wrist, initial encounter for closed fracture: Secondary | ICD-10-CM

## 2022-07-15 DIAGNOSIS — Z043 Encounter for examination and observation following other accident: Secondary | ICD-10-CM | POA: Diagnosis not present

## 2022-07-15 DIAGNOSIS — M25571 Pain in right ankle and joints of right foot: Secondary | ICD-10-CM | POA: Diagnosis not present

## 2022-07-15 MED ORDER — HYDROCODONE-ACETAMINOPHEN 5-325 MG PO TABS: 1.0000 | ORAL_TABLET | ORAL | 0 refills | Status: DC | PRN

## 2022-07-15 NOTE — ED Notes (Signed)
Pt. States he does not want the cam boot.

## 2022-07-15 NOTE — ED Notes (Signed)
Patient declined CAM Boot. Dr. Wyvonnia Dusky aware.

## 2022-07-15 NOTE — ED Notes (Signed)
Pt declined cam boot.

## 2022-07-15 NOTE — Discharge Instructions (Signed)
Your x-rays show there is a small chip off of the bones in your wrist as well as a small chip off of the bone in your foot.  Wear the splint as prescribed and take the pain medication.  Keep arm and leg elevated while you are resting.  Use ice and anti-inflammatories.  Follow-up with the orthopedic doctor and the hand surgeon for further evaluation.  Return to the ED with new or worsening symptoms

## 2022-07-15 NOTE — ED Triage Notes (Signed)
Patient reports to the ER for a fall. Patient reports he was carrying tool boxes down the steps and missed the last step. Reports pain in the left forearm and right big toe

## 2022-07-15 NOTE — ED Provider Notes (Signed)
[facility] Provider Note   CSN: [id number] Arrival date & time: 07/15/22  1424     History  Chief Complaint  Patient presents with   [name]    Alan Lopez is a 76 y.o. male.  Fall last night around 9 pm while carrying picture hanging tools. Landed on outstretched L wrist and twisted R ankle. Denies hitting head or LOC. No neck or back pain. No chest pain or shortness of breath. No dizziness or lightheadedness or near syncope. Pain to L lateral wrist and R lateral ankle and great toe. Takes ASA, no other anticoagulation. Took tylenol at home without relief.  The history is provided by the patient.  Fall       Home Medications Prior to Admission medications   Medication Sig Start Date End Date Taking? Authorizing Provider  acetaminophen (TYLENOL) 500 MG tablet Take 500 mg by mouth every 6 (six) hours as needed for mild pain or moderate pain.    [provider]  alfuzosin (UROXATRAL) 10 MG 24 hr tablet Take 1 tablet (10 mg total) by mouth daily with breakfast. 07/12/22   McKenzie, Candee Furbish, MD  aspirin EC 81 MG tablet Take 81 mg by mouth daily.    [provider]  cholecalciferol (VITAMIN D) 1000 UNITS tablet Take 1,000 Units by mouth daily.    [provider]  dutasteride (AVODART) 0.5 MG capsule TAKE ONE CAPSULE BY MOUTH DAILY 03/27/21   McKenzie, Candee Furbish, MD  finasteride (PROSCAR) 5 MG tablet Take 1 tablet (5 mg total) by mouth daily. 07/12/22   McKenzie, Candee Furbish, MD  hydroxychloroquine (PLAQUENIL) 200 MG tablet Take 200 mg by mouth 2 (two) times daily. Patient not taking: Reported on 06/06/2022 01/15/21   [provider]  Insulin Disposable Pump (V-GO 30) 30 UNIT/24HR KIT Inject 30 Units into the skin daily. 04/19/22   Elayne Snare, MD  Insulin Pen Needle 32G X 4 MM MISC Use Three per day to inject insulin 08/09/19   Elayne Snare, MD  insulin regular (NOVOLIN R,HUMULIN R) 100 units/mL injection Inject into the skin  3 (three) times daily before meals. USE INSULIN TO FILL UP v-GO PUMP    [provider]  JARDIANCE 25 MG TABS tablet TAKE ONE TABLET BY MOUTH DAILY 06/12/22   Elayne Snare, MD  losartan (COZAAR) 25 MG tablet Take 1 tablet (25 mg total) by mouth daily. 12/27/21   Jerline Pain, MD  metFORMIN (GLUCOPHAGE-XR) 500 MG 24 hr tablet Take 1 tablet (500 mg total) by mouth daily with supper. 11/21/21   Elayne Snare, MD  pioglitazone (ACTOS) 15 MG tablet TAKE ONE TABLET BY MOUTH DAILY 03/25/22   Elayne Snare, MD  rosuvastatin (CRESTOR) 10 MG tablet Take 1 tablet (10 mg total) by mouth at bedtime. 01/07/22   Jerline Pain, MD      Allergies    Atorvastatin and Codeine    Review of Systems   Review of Systems  Physical Exam Updated Vital Signs BP (!) 150/80 (BP Location: Left Arm)   Pulse 76   Temp 97.8 F (36.6 C)   Resp 16   SpO2 96%  Physical Exam Vitals and nursing note reviewed.  Constitutional:      General: He is not in acute distress.    Appearance: He is well-developed. He is not ill-appearing.  HENT:     Head: Normocephalic and atraumatic.     Mouth/Throat:     Pharynx: No oropharyngeal exudate.  Eyes:  Conjunctiva/sclera: Conjunctivae normal.     Pupils: Pupils are equal, round, and reactive to light.  Neck:     Comments: No C spine tenderness Cardiovascular:     Rate and Rhythm: Normal rate and regular rhythm.     Heart sounds: Normal heart sounds. No murmur heard. Pulmonary:     Effort: Pulmonary effort is normal. No respiratory distress.     Breath sounds: Normal breath sounds.  Chest:     Chest wall: No tenderness.  Abdominal:     Palpations: Abdomen is soft.     Tenderness: There is no abdominal tenderness. There is no guarding or rebound.  Musculoskeletal:        General: Swelling, tenderness and signs of injury present.     Cervical back: Normal range of motion and neck supple.     Comments: Swelling and tenderness to L lateral wrist on dorsum. No weakness  or numbness. Intact radial pulse. Reduced ROM 2/2 pain  R lateral ankle swelling. No deformity. Ecchymosis proximal phalanx R great toe. Intact DP and PT pulses.   No T or L spine tenderness. FROM bilateral hips and knees without pain  Skin:    General: Skin is warm.  Neurological:     General: No focal deficit present.     Mental Status: He is alert and oriented to person, place, and time. Mental status is at baseline.     Cranial Nerves: No cranial nerve deficit.     Motor: No abnormal muscle tone.     Coordination: Coordination normal.     Comments:  5/5 strength throughout. CN 2-12 intact.Equal grip strength.   Psychiatric:        Behavior: Behavior normal.     ED Results / Procedures / Treatments   Labs (all labs ordered are listed, but only abnormal results are displayed) Labs Reviewed - No data to display  EKG None  Radiology DG Toe Great Right  Result Date: 07/15/2022 CLINICAL DATA:  Fall. EXAM: RIGHT GREAT TOE COMPARISON:  None Available. FINDINGS: Probable minimally displaced cortical fracture is seen involving the distal portion of the first metatarsal. Moderate degenerative changes seen involving the first interphalangeal joint. Soft tissues are unremarkable. 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 Probable minimally displaced cortical fracture involving the distal first metatarsal. Electronically Signed   By: Marijo Conception M.D.   On: 07/15/2022 15:56   DG Ankle Complete Right  Result Date: 07/15/2022 CLINICAL DATA:  Right ankle pain after fall. EXAM: RIGHT ANKLE - COMPLETE 3+ VIEW COMPARISON:  None Available. FINDINGS: There is no evidence of fracture, dislocation, or joint effusion. There is no evidence of arthropathy or other focal bone abnormality. Soft tissues are unremarkable. IMPRESSION: Negative. Electronically Signed   By: Marijo Conception M.D.   On: 07/15/2022 15:53   DG Wrist Complete Left  Result Date:  07/15/2022 CLINICAL DATA:  Trauma, fall EXAM: LEFT WRIST - COMPLETE 3+ VIEW COMPARISON:  None Available. FINDINGS: There is cortical irregularity in the dorsal aspect of 1 of the carpals seen in the lateral view. No displaced fracture or dislocation is seen. Chondrocalcinosis is seen in the radiocarpal joint. There are no opaque foreign bodies. IMPRESSION: There is cortical irregularity in 1 of the carpals in the dorsal aspect of the wrist seen in the lateral projection. This may suggest recent or old a nondisplaced fracture. No recent displaced fracture is seen. 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 Electronically Signed   By:  Elmer Picker M.D.   On: 07/15/2022 15:16    Procedures Procedures    Medications Ordered in ED Medications - No data to display  ED Course/ Medical Decision Making/ A&P                           Medical Decision Making Amount and/or Complexity of Data Reviewed Labs: ordered. Decision-making details documented in ED Course. Radiology: ordered and independent interpretation performed. Decision-making details documented in ED Course. ECG/medicine tests: ordered and independent interpretation performed. Decision-making details documented in ED Course.  Risk Prescription drug management.  Fall with L wrist and R ankle pain. No head injury. Neurovascularly intact.   Cortical irregularity on L wrist xray without displaced fracture or dislocation. Results reviewed an interpreted by me.   X-ray shows possible chip fracture of the first metatarsal.  No other acute fractures.  No displacement.  Patient declines cam walker despite recommendations for same.  Referral given to hand strays as well as orthopedics.  Discussed ice, elevation, pain control and specialist follow-up.  Turn precautions discussed        Final Clinical Impression(s) / ED Diagnoses Final diagnoses:  Closed fracture of left wrist, initial encounter   Closed nondisplaced fracture of first metatarsal bone of right foot, initial encounter    Rx / DC Orders ED Discharge Orders     None         Nyara Capell, Annie Main, MD 07/15/22 1658

## 2022-07-18 DIAGNOSIS — M25532 Pain in left wrist: Secondary | ICD-10-CM | POA: Diagnosis not present

## 2022-07-18 DIAGNOSIS — S62112A Displaced fracture of triquetrum [cuneiform] bone, left wrist, initial encounter for closed fracture: Secondary | ICD-10-CM | POA: Diagnosis not present

## 2022-07-19 ENCOUNTER — Ambulatory Visit: Payer: Medicare Other | Admitting: Urology

## 2022-08-07 ENCOUNTER — Other Ambulatory Visit: Payer: Self-pay | Admitting: Endocrinology

## 2022-08-15 DIAGNOSIS — S62112A Displaced fracture of triquetrum [cuneiform] bone, left wrist, initial encounter for closed fracture: Secondary | ICD-10-CM | POA: Diagnosis not present

## 2022-08-15 DIAGNOSIS — M25532 Pain in left wrist: Secondary | ICD-10-CM | POA: Diagnosis not present

## 2022-08-15 DIAGNOSIS — M65342 Trigger finger, left ring finger: Secondary | ICD-10-CM | POA: Diagnosis not present

## 2022-09-16 DIAGNOSIS — L920 Granuloma annulare: Secondary | ICD-10-CM | POA: Diagnosis not present

## 2022-09-16 DIAGNOSIS — L570 Actinic keratosis: Secondary | ICD-10-CM | POA: Diagnosis not present

## 2022-10-01 ENCOUNTER — Other Ambulatory Visit (INDEPENDENT_AMBULATORY_CARE_PROVIDER_SITE_OTHER): Payer: Medicare Other

## 2022-10-01 DIAGNOSIS — E1142 Type 2 diabetes mellitus with diabetic polyneuropathy: Secondary | ICD-10-CM | POA: Diagnosis not present

## 2022-10-01 DIAGNOSIS — E7800 Pure hypercholesterolemia, unspecified: Secondary | ICD-10-CM | POA: Diagnosis not present

## 2022-10-01 LAB — COMPREHENSIVE METABOLIC PANEL
ALT: 33 U/L (ref 0–53)
AST: 24 U/L (ref 0–37)
Albumin: 4.1 g/dL (ref 3.5–5.2)
Alkaline Phosphatase: 56 U/L (ref 39–117)
BUN: 24 mg/dL — ABNORMAL HIGH (ref 6–23)
CO2: 23 mEq/L (ref 19–32)
Calcium: 9.1 mg/dL (ref 8.4–10.5)
Chloride: 107 mEq/L (ref 96–112)
Creatinine, Ser: 1.04 mg/dL (ref 0.40–1.50)
GFR: 69.19 mL/min (ref >60.00)
Glucose, Bld: 102 mg/dL — ABNORMAL HIGH (ref 70–99)
Potassium: 4 mEq/L (ref 3.5–5.1)
Sodium: 139 mEq/L (ref 135–145)
Total Bilirubin: 0.6 mg/dL (ref 0.2–1.2)
Total Protein: 6.9 g/dL (ref 6.0–8.3)

## 2022-10-01 LAB — LIPID PANEL
Cholesterol: 131 mg/dL (ref 0–200)
HDL: 71.3 mg/dL (ref >39.00)
LDL Cholesterol: 42 mg/dL (ref 0–99)
NonHDL: 59.4
Total CHOL/HDL Ratio: 2
Triglycerides: 85 mg/dL (ref 0.0–149.0)
VLDL: 17 mg/dL (ref 0.0–40.0)

## 2022-10-01 LAB — MICROALBUMIN / CREATININE URINE RATIO
Creatinine,U: 73.2 mg/dL
Microalb Creat Ratio: 1.3 mg/g (ref 0.0–30.0)
Microalb, Ur: 1 mg/dL (ref 0.0–1.9)

## 2022-10-01 LAB — HEMOGLOBIN A1C: Hgb A1c MFr Bld: 7.1 % — ABNORMAL HIGH (ref 4.6–6.5)

## 2022-10-02 NOTE — Progress Notes (Signed)
Patient ID: Alan Lopez, male   DOB: [birth year], 76 y.o.   MRN: [id number]            Reason for Appointment: Follow-up for Type 2 Diabetes   History of Present Illness:          Date of diagnosis of type 2 diabetes mellitus :  1999      Past history:  He had modestly increase blood sugars at diagnosis and was started on metformin Subsequently glipizide was added to improve his control. He thinks that his blood sugars have been mostly poorly controlled over the last several years He had been on glipizide and metformin for years without any adjustment of his dosage of glipizide  Review of his A1c shows that it had ranged from 9-9.5 since 07/2013 Previously his PCP tried him on a sample of Jardiance 10 mg without significant improvement  He was started on the V-go pump on 07/02/17 because of inconsistent control and poor compliance with mealtime insulin  Recent history:   Oral hypoglycemic drugs the patient is taking are: Actos 15 mg, Jardiance 25 mg daily  Current INSULIN regimen: V-go pump SETTINGS: Basal rate = 30 units BOLUSES = 3-4 units at breakfast, 6-8 at lunch and  6-10 U at dinner  A1c is 7.1, previously was better at 6.3  Fructosamine previously 225  Current blood sugar patterns and problems identified: He again did not bring his blood sugar monitor for download He says that because he was on vacation for 14 days his sugars may have been higher Also checking blood sugars very sporadically and generally only in the mornings He has not changed his insulin boluses and does usually try to take the boluses before starting to eat .  Does walk periodically and does some yard work Currently his weight is about the same He does take his Jardiance and Actos regularly No hypoglycemia reported   Side effects from medications have been: Mild  diarrhea from regular metformin  Meals 8, am pm 6-7  Glucose monitoring:  done less than once a day usually       Glucometer:    Contour   Blood sugars by recall about a 100 average in the morning, not clear if sugars are high after meals  Prior:  AVERAGE in the morning 100 Nonfasting readings 123-245 with only 4 readings, after lunch or dinner Overall AVERAGE 131     Self-care: The diet that the patient has been following is: tries to limit high carbohydrate foods. eating out about 4 times a week      Typical meal intake: Breakfast is  Toast/meat .  Usually 2 servings of vegetables in the evening and avoiding potatoes and bread usually Usually avoiding drinks with sugar                Dietician visit, most recent: 05/2015  CDE visit: 01/2016  Weight history: Maximum weight 215 previously  Wt Readings from Last 3 Encounters:  10/03/22 207 lb (93.9 kg)  06/06/22 207 lb 3.2 oz (94 kg)  02/21/22 209 lb (94.8 kg)    Glycemic control:    Lab Results  Component Value Date   HGBA1C 7.1 (H) 10/01/2022   HGBA1C 6.3 06/04/2022   HGBA1C 7.1 (H) 02/19/2022   Lab Results  Component Value Date   MICROALBUR 1.0 10/01/2022   LDLCALC 42 10/01/2022   CREATININE 1.04 10/01/2022    Lab on 10/01/2022  Component Date Value Ref Range Status   Cholesterol 10/01/2022  131  0 - 200 mg/dL Final   ATP III Classification       Desirable:  < 200 mg/dL               Borderline High:  200 - 239 mg/dL          High:  > = 829 mg/dL   Triglycerides 56/21/3086 85.0  0.0 - 149.0 mg/dL Final   Normal:  <578 mg/dLBorderline High:  150 - 199 mg/dL   HDL 46/96/2952 84.13  >39.00 mg/dL Final   VLDL 24/40/1027 17.0  0.0 - 40.0 mg/dL Final   LDL Cholesterol 10/01/2022 42  0 - 99 mg/dL Final   Total CHOL/HDL Ratio 10/01/2022 2   Final                  Men          Women1/2 Average Risk     3.4          3.3Average Risk          5.0          4.42X Average Risk          9.6          7.13X Average Risk          15.0          11.0                       NonHDL 10/01/2022 59.40   Final   NOTE:  Non-HDL goal should be 30 mg/dL higher than  patient's LDL goal (i.e. LDL goal of < 70 mg/dL, would have non-HDL goal of < 100 mg/dL)   Microalb, Ur 25/36/6440 1.0  0.0 - 1.9 mg/dL Final   Creatinine,U 34/74/2595 73.2  mg/dL Final   Microalb Creat Ratio 10/01/2022 1.3  0.0 - 30.0 mg/g Final   Sodium 10/01/2022 139  135 - 145 mEq/L Final   Potassium 10/01/2022 4.0  3.5 - 5.1 mEq/L Final   Chloride 10/01/2022 107  96 - 112 mEq/L Final   CO2 10/01/2022 23  19 - 32 mEq/L Final   Glucose, Bld 10/01/2022 102 (H)  70 - 99 mg/dL Final   BUN 63/87/5643 24 (H)  6 - 23 mg/dL Final   Creatinine, Ser 10/01/2022 1.04  0.40 - 1.50 mg/dL Final   Total Bilirubin 10/01/2022 0.6  0.2 - 1.2 mg/dL Final   Alkaline Phosphatase 10/01/2022 56  39 - 117 U/L Final   AST 10/01/2022 24  0 - 37 U/L Final   ALT 10/01/2022 33  0 - 53 U/L Final   Total Protein 10/01/2022 6.9  6.0 - 8.3 g/dL Final   Albumin 32/95/1884 4.1  3.5 - 5.2 g/dL Final   GFR 16/60/6301 69.19  >60.00 mL/min Final   Calculated using the CKD-EPI Creatinine Equation (2021)   Calcium 10/01/2022 9.1  8.4 - 10.5 mg/dL Final   Hgb S0F MFr Bld 10/01/2022 7.1 (H)  4.6 - 6.5 % Final   Glycemic Control Guidelines for People with Diabetes:Non Diabetic:  <6%Goal of Therapy: <7%Additional Action Suggested:  >8%    Other active problems: See review of systems   Allergies as of 10/03/2022       Reactions   Atorvastatin Other (See Comments)   Codeine Other (See Comments)   Patient doesn't like how it makes him feel, "spaced out, not together"        Medication List  Accurate as of October 03, 2022  9:01 AM. If you have any questions, ask your nurse or doctor.          acetaminophen 500 MG tablet Commonly known as: TYLENOL Take 500 mg by mouth every 6 (six) hours as needed for mild pain or moderate pain.   alfuzosin 10 MG 24 hr tablet Commonly known as: UROXATRAL Take 1 tablet (10 mg total) by mouth daily with breakfast.   aspirin EC 81 MG tablet Take 81 mg by mouth daily.    cholecalciferol 1000 units tablet Commonly known as: VITAMIN D Take 1,000 Units by mouth daily.   dutasteride 0.5 MG capsule Commonly known as: AVODART TAKE ONE CAPSULE BY MOUTH DAILY   finasteride 5 MG tablet Commonly known as: PROSCAR Take 1 tablet (5 mg total) by mouth daily.   HYDROcodone-acetaminophen 5-325 MG tablet Commonly known as: NORCO/VICODIN Take 1 tablet by mouth every 4 (four) hours as needed.   hydroxychloroquine 200 MG tablet Commonly known as: PLAQUENIL Take 200 mg by mouth 2 (two) times daily.   Insulin Pen Needle 32G X 4 MM Misc Use Three per day to inject insulin   insulin regular 100 units/mL injection Commonly known as: NOVOLIN R Inject into the skin 3 (three) times daily before meals. USE INSULIN TO FILL UP v-GO PUMP   Jardiance 25 MG Tabs tablet Generic drug: empagliflozin TAKE 1 TABLET BY MOUTH DAILY   losartan 25 MG tablet Commonly known as: COZAAR Take 1 tablet (25 mg total) by mouth daily.   metFORMIN 500 MG 24 hr tablet Commonly known as: GLUCOPHAGE-XR Take 1 tablet (500 mg total) by mouth daily with supper.   pioglitazone 15 MG tablet Commonly known as: ACTOS TAKE ONE TABLET BY MOUTH DAILY   rosuvastatin 10 MG tablet Commonly known as: Crestor Take 1 tablet (10 mg total) by mouth at bedtime.   V-Go 30 30 UNIT/24HR Kit Inject 30 Units into the skin daily.        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   HTN (hypertension)    Hypertrophy (benign) of prostate    Multinodular goiter 04/12/2015   Non-insulin dependent type 2 diabetes mellitus (HCC)    Status post non-ST elevation myocardial infarction (NSTEMI)    CATH   Tobacco abuse    Type  II diabetes mellitus, uncontrolled 04/12/2015    Past Surgical History:  Procedure Laterality Date   CORONARY ANGIOPLASTY WITH STENT PLACEMENT      Family History  Problem Relation Age of Onset   Diabetes Mother    Diabetes Brother    Cancer Brother    Heart disease Neg Hx     Social History:  reports that he has been smoking cigarettes. He has been exposed to tobacco smoke. He has never used smokeless tobacco. He reports current alcohol use of about 10.0 standard drinks of alcohol per week. He reports that he does not use drugs.    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   His last eye exam was in 08/2020  THYROID: Previously was found to have a 2 cm nodule on his left thyroid on his exam but ultrasound in 04/2015 showed multinodular goiter with mostly small nodules; the largest nodule on the left side of 1.4 cm has the appearance of a pseudo- nodule  Thyroid levels have been normal consistently  Lab Results  Component Value Date   TSH 1.80 12/18/2020    NEUROPATHY: Has feeling of numbness on the soles of his feet which is not significant now Only occasionally may have a tingling sensation  Blood pressure management: He is only on 25 mg losartan from his cardiologist Also on Jardiance 25 mg  BP Readings from Last 3 Encounters:  10/03/22 (!) 140/80  07/15/22 (!) 152/81  07/12/22 (!) 164/82   No recent change in renal function  Lab Results  Component Value Date   CREATININE 1.04 10/01/2022   CREATININE 0.92 06/04/2022   CREATININE 0.96 02/19/2022     LABS:  Lab on 10/01/2022  Component Date Value  Ref Range Status   Cholesterol 10/01/2022 131  0 - 200 mg/dL Final   ATP III Classification       Desirable:  < 200 mg/dL               Borderline High:  200 - 239 mg/dL          High:  > = 409 mg/dL   Triglycerides 81/19/1478 85.0  0.0 - 149.0 mg/dL Final   Normal:  <295 mg/dLBorderline High:  150 - 199 mg/dL   HDL 62/13/0865 78.46  >39.00 mg/dL Final   VLDL 96/29/5284 17.0  0.0 - 40.0 mg/dL Final   LDL Cholesterol 10/01/2022 42  0 - 99 mg/dL Final   Total CHOL/HDL Ratio 10/01/2022 2   Final                  Men          Women1/2 Average Risk     3.4          3.3Average Risk          5.0          4.42X Average Risk          9.6          7.13X Average Risk          15.0          11.0                       NonHDL 10/01/2022 59.40   Final   NOTE:  Non-HDL goal should be 30 mg/dL higher than patient's LDL goal (i.e. LDL goal of < 70 mg/dL, would have non-HDL goal of < 100 mg/dL)   Microalb, Ur 13/24/4010 1.0  0.0 - 1.9 mg/dL Final   Creatinine,U 27/25/3664 73.2  mg/dL Final   Microalb Creat Ratio 10/01/2022 1.3  0.0 - 30.0 mg/g Final   Sodium 10/01/2022 139  135 - 145 mEq/L Final   Potassium 10/01/2022 4.0  3.5 - 5.1 mEq/L Final   Chloride 10/01/2022 107  96 - 112 mEq/L Final   CO2  10/01/2022 23  19 - 32 mEq/L Final   Glucose, Bld 10/01/2022 102 (H)  70 - 99 mg/dL Final   BUN 40/98/1191 24 (H)  6 - 23 mg/dL Final   Creatinine, Ser 10/01/2022 1.04  0.40 - 1.50 mg/dL Final   Total Bilirubin 10/01/2022 0.6  0.2 - 1.2 mg/dL Final   Alkaline Phosphatase 10/01/2022 56  39 - 117 U/L Final   AST 10/01/2022 24  0 - 37 U/L Final   ALT 10/01/2022 33  0 - 53 U/L Final   Total Protein 10/01/2022 6.9  6.0 - 8.3 g/dL Final   Albumin 47/82/9562 4.1  3.5 - 5.2 g/dL Final   GFR 13/07/6577 69.19  >60.00 mL/min Final   Calculated using the CKD-EPI Creatinine Equation (2021)   Calcium 10/01/2022 9.1  8.4 - 10.5 mg/dL Final   Hgb I6N MFr Bld 10/01/2022 7.1 (H)  4.6 - 6.5 % Final   Glycemic Control Guidelines  for People with Diabetes:Non Diabetic:  <6%Goal of Therapy: <7%Additional Action Suggested:  >8%     Physical Examination:  BP (!) 140/80   Pulse (!) 57   Ht 6' (1.829 m)   Wt 207 lb (93.9 kg)   SpO2 92%   BMI 28.07 kg/m      ASSESSMENT:  Diabetes type 2, on insulin  See history of present illness for detailed discussion of his current management, blood sugar patterns and problems identified  A1c is back up to 7.1, was at 6.3  Current treatment regimen: Basal bolus insulin with V-go pump 30 units basal, Actos 15 mg and Jardiance 25 mg daily  He has not done well likely with lifestyle changes as far as diet and exercise is also along medications Blood sugars are not being monitored much and he is not motivated likely to check his blood sugars He is using his arms for a V-go pump and has no difficulty following instructions for using this Previously had been somewhat reluctant to do the CGM but he is now open to do this He would prefer a sensor on his abdomen Discussed that he likely can improve his lifestyle by watching his blood blood sugar readings with the sensor  Mild hypertension: Blood pressure is fairly well controlled with losartan and Jardiance   Lipids are well controlled  Urine albumin normal   PLAN:   Start using Dexcom sensor G7 in order to monitor blood sugars closely this will be ordered through the [employer] and he will let us know if he needs any help starting Clarity app will be downloaded on his phone He thinks he can likely adjust his insulin boluses and watch his diet better with this He will also try to be consistent with diet and exercise regimen and watching carbohydrates and snacks Continue same basal rate on the pump To call if blood sugars are consistently high  No change in current medications or insulin regimen However needs to check blood sugars more consistently including after meals   Follow-up in 4 months     There are  no Patient Instructions on file for this visit.     Reather Littler 10/03/2022, 9:01 AM   Note: This office note was prepared with Dragon voice recognition system technology. Any transcriptional errors that result from this process are unintentional.

## 2022-10-03 ENCOUNTER — Encounter: Payer: Self-pay | Admitting: Endocrinology

## 2022-10-03 ENCOUNTER — Ambulatory Visit (INDEPENDENT_AMBULATORY_CARE_PROVIDER_SITE_OTHER): Payer: Medicare Other | Admitting: Endocrinology

## 2022-10-03 VITALS — BP 140/80 | HR 57 | Ht 72.0 in | Wt 207.0 lb

## 2022-10-03 DIAGNOSIS — E1165 Type 2 diabetes mellitus with hyperglycemia: Secondary | ICD-10-CM

## 2022-10-03 DIAGNOSIS — E7800 Pure hypercholesterolemia, unspecified: Secondary | ICD-10-CM | POA: Diagnosis not present

## 2022-10-03 DIAGNOSIS — Z794 Long term (current) use of insulin: Secondary | ICD-10-CM | POA: Diagnosis not present

## 2022-10-09 ENCOUNTER — Other Ambulatory Visit: Payer: Self-pay | Admitting: Endocrinology

## 2022-11-14 ENCOUNTER — Ambulatory Visit (INDEPENDENT_AMBULATORY_CARE_PROVIDER_SITE_OTHER): Payer: Medicare Other | Admitting: Family Medicine

## 2022-11-14 ENCOUNTER — Encounter: Payer: Self-pay | Admitting: Family Medicine

## 2022-11-14 DIAGNOSIS — R059 Cough, unspecified: Secondary | ICD-10-CM

## 2022-11-14 DIAGNOSIS — R0989 Other specified symptoms and signs involving the circulatory and respiratory systems: Secondary | ICD-10-CM | POA: Diagnosis not present

## 2022-11-14 DIAGNOSIS — J101 Influenza due to other identified influenza virus with other respiratory manifestations: Secondary | ICD-10-CM | POA: Diagnosis not present

## 2022-11-14 LAB — POCT INFLUENZA A/B
Influenza A, POC: NEGATIVE
Influenza B, POC: POSITIVE — AB

## 2022-11-14 LAB — POC COVID19 BINAXNOW: SARS Coronavirus 2 Ag: NEGATIVE

## 2022-11-14 MED ORDER — OSELTAMIVIR PHOSPHATE 75 MG PO CAPS: 75.0000 mg | ORAL_CAPSULE | Freq: Two times a day (BID) | ORAL | 0 refills | Status: AC

## 2022-11-14 NOTE — Progress Notes (Signed)
Virtual Visit via Video Note  I connected with Alan Lopez on 11/14/22 at  3:30 PM EST by a video enabled telemedicine application 2/2 [id number]-76 pandemic and verified that I am speaking with the correct person using two identifiers.  Location patient: home Location provider:work or home office Persons participating in the virtual visit: patient, provider  I discussed the limitations of evaluation and management by telemedicine and the availability of in person appointments. The patient expressed understanding and agreed to proceed.  Chief Complaint  Patient presents with   Influenza    Runny nose, cough, intermittent headache, sneezing. Has taken OTC Mucinex. Symptoms started Mon evening. Denies SOB or fever.     HPI: Pt is a 76 yo male with pmh sig for CAD, HTN, history of STEMI, multinodular goiter, DM2, blindness left eye, HLD, history of tobacco use followed by Dr. Ronnald Ramp and seen for acute concern. Monday evening pt started feeling bad.  Went to bed early.   Developed cough, rhinorrhea, HAs, decreased appetite.  Appetite and symptoms slightly better today.  Staying hydrated. Had fever Mon and Tues Denies ear pain/pressure, facial pain/pressure, sore throat, n/v. Possible sick contacts include over 400 people) patient attended last week.   ROS: See pertinent positives and negatives per HPI.  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   HTN (hypertension)    Hypertrophy (benign) of prostate    Multinodular goiter 04/12/2015   Non-insulin dependent type 2 diabetes mellitus (HCC)    Status post non-ST elevation myocardial infarction (NSTEMI)    CATH   Tobacco abuse    Type II diabetes mellitus, uncontrolled 04/12/2015    Past Surgical History:  Procedure Laterality Date   CORONARY ANGIOPLASTY WITH STENT PLACEMENT      Family History  Problem Relation Age of Onset   Diabetes  Mother    Diabetes Brother    Cancer Brother    Heart disease Neg Hx     Current Outpatient Medications:    acetaminophen (TYLENOL) 500 MG tablet, Take 500 mg by mouth every 6 (six) hours as needed for mild pain or moderate pain., Disp: , Rfl:    alfuzosin (UROXATRAL) 10 MG 24 hr tablet, Take 1 tablet (10 mg total) by mouth daily with breakfast., Disp: 90 tablet, Rfl: 3   aspirin EC 81 MG tablet, Take 81 mg by mouth daily., Disp: , Rfl:    cholecalciferol (VITAMIN D) 1000 UNITS tablet, Take 1,000 Units by mouth daily., Disp: , Rfl:    finasteride (PROSCAR) 5 MG tablet, Take 1 tablet (5 mg total) by mouth daily., Disp: 90 tablet, Rfl: 3   hydroxychloroquine (PLAQUENIL) 200 MG tablet, Take 200 mg by mouth 2 (two) times daily., Disp: , Rfl:    Insulin Disposable Pump (V-GO 30) 30 UNIT/24HR KIT, Inject 30 Units into the skin daily., Disp: 1 kit, Rfl: 3   Insulin Pen Needle 32G X 4 MM MISC, Use Three per day to inject insulin, Disp: 100 each, Rfl: 1   insulin regular (NOVOLIN R,HUMULIN R) 100 units/mL injection, Inject into the skin 3 (three) times daily before meals. USE INSULIN TO FILL UP v-GO PUMP, Disp: , Rfl:    JARDIANCE 25 MG TABS tablet, TAKE 1 TABLET BY MOUTH DAILY, Disp: 30 tablet, Rfl: 1   losartan (COZAAR) 25 MG tablet, Take 1 tablet (25 mg total) by mouth daily., Disp: 90 tablet, Rfl: 3  pioglitazone (ACTOS) 15 MG tablet, TAKE ONE TABLET BY MOUTH DAILY, Disp: 90 tablet, Rfl: 2   rosuvastatin (CRESTOR) 10 MG tablet, Take 1 tablet (10 mg total) by mouth at bedtime., Disp: 90 tablet, Rfl: 3   HYDROcodone-acetaminophen (NORCO/VICODIN) 5-325 MG tablet, Take 1 tablet by mouth every 4 (four) hours as needed., Disp: 10 tablet, Rfl: 0   metFORMIN (GLUCOPHAGE-XR) 500 MG 24 hr tablet, Take 1 tablet (500 mg total) by mouth daily with supper., Disp: 90 tablet, Rfl: 3  EXAM:  VITALS per patient if applicable: RR between 01-31 bpm  GENERAL: alert, oriented, appears well and in no acute  distress  HEENT: atraumatic, conjunctiva clear, no obvious abnormalities on inspection of external nose and ears  NECK: normal movements of the head and neck  LUNGS: Cough, on inspection no signs of respiratory distress, breathing rate appears normal, no obvious gross SOB, gasping or wheezing  CV: no obvious cyanosis  MS: moves all visible extremities without noticeable abnormality  PSYCH/NEURO: pleasant and cooperative, no obvious depression or anxiety, speech and thought processing grossly intact  ASSESSMENT AND PLAN:  Discussed the following assessment and plan:  Influenza B - Plan: oseltamivir (TAMIFLU) 75 MG capsule  Runny nose - Plan: POC COVID-19, POC Influenza A/B  Cough, unspecified type - Plan: POC COVID-19, POC Influenza A/B   Viral URI symptoms starting 11/11/2022.  Influenza B testing positive in clinic.  COVID and influenza a testing negative.  Discussed r/b/a of antiviral medications.  Will start Tamiflu.  Continue supportive care with OTC cough/cold medications.  Given strict precautions.  Follow-up as needed    I discussed the assessment and treatment plan with the patient. The patient was provided an opportunity to ask questions and all were answered. The patient agreed with the plan and demonstrated an understanding of the instructions.   The patient was advised to call back or seek an in-person evaluation if the symptoms worsen or if the condition fails to improve as anticipated.  Billie Ruddy, MD

## 2022-12-12 ENCOUNTER — Other Ambulatory Visit: Payer: Self-pay | Admitting: Endocrinology

## 2022-12-18 ENCOUNTER — Encounter: Payer: Self-pay | Admitting: Endocrinology

## 2022-12-18 DIAGNOSIS — E119 Type 2 diabetes mellitus without complications: Secondary | ICD-10-CM | POA: Diagnosis not present

## 2022-12-18 DIAGNOSIS — H5201 Hypermetropia, right eye: Secondary | ICD-10-CM | POA: Diagnosis not present

## 2022-12-18 DIAGNOSIS — H2513 Age-related nuclear cataract, bilateral: Secondary | ICD-10-CM | POA: Diagnosis not present

## 2022-12-18 LAB — HM DIABETES EYE EXAM

## 2023-01-15 ENCOUNTER — Other Ambulatory Visit: Payer: Medicare Other

## 2023-01-16 ENCOUNTER — Other Ambulatory Visit: Payer: Medicare Other

## 2023-01-16 DIAGNOSIS — R972 Elevated prostate specific antigen [PSA]: Secondary | ICD-10-CM | POA: Diagnosis not present

## 2023-01-17 DIAGNOSIS — L920 Granuloma annulare: Secondary | ICD-10-CM | POA: Diagnosis not present

## 2023-01-17 LAB — PSA: Prostate Specific Ag, Serum: 10.6 ng/mL — ABNORMAL HIGH (ref 0.0–4.0)

## 2023-02-04 ENCOUNTER — Other Ambulatory Visit (INDEPENDENT_AMBULATORY_CARE_PROVIDER_SITE_OTHER): Payer: Medicare Other

## 2023-02-04 DIAGNOSIS — Z794 Long term (current) use of insulin: Secondary | ICD-10-CM | POA: Diagnosis not present

## 2023-02-04 DIAGNOSIS — E1165 Type 2 diabetes mellitus with hyperglycemia: Secondary | ICD-10-CM

## 2023-02-04 LAB — BASIC METABOLIC PANEL
BUN: 23 mg/dL (ref 6–23)
CO2: 25 mEq/L (ref 19–32)
Calcium: 9 mg/dL (ref 8.4–10.5)
Chloride: 108 mEq/L (ref 96–112)
Creatinine, Ser: 0.83 mg/dL (ref 0.40–1.50)
GFR: 84.13 mL/min (ref >60.00)
Glucose, Bld: 95 mg/dL (ref 70–99)
Potassium: 4.2 mEq/L (ref 3.5–5.1)
Sodium: 139 mEq/L (ref 135–145)

## 2023-02-04 LAB — HEMOGLOBIN A1C: Hgb A1c MFr Bld: 6.8 % — ABNORMAL HIGH (ref 4.6–6.5)

## 2023-02-06 ENCOUNTER — Encounter: Payer: Self-pay | Admitting: Endocrinology

## 2023-02-06 ENCOUNTER — Telehealth: Payer: Self-pay

## 2023-02-06 ENCOUNTER — Ambulatory Visit (INDEPENDENT_AMBULATORY_CARE_PROVIDER_SITE_OTHER): Payer: Medicare Other | Admitting: Endocrinology

## 2023-02-06 VITALS — BP 120/70 | HR 86 | Ht 72.0 in | Wt 214.0 lb

## 2023-02-06 DIAGNOSIS — Z794 Long term (current) use of insulin: Secondary | ICD-10-CM

## 2023-02-06 DIAGNOSIS — E1165 Type 2 diabetes mellitus with hyperglycemia: Secondary | ICD-10-CM

## 2023-02-06 NOTE — Patient Instructions (Signed)
Check blood sugars on waking up 3days a week  Also check blood sugars about 2 hours after meals and do this after different meals by rotation  Recommended blood sugar levels on waking up are 90-130 and about 2 hours after meal is 130-160  Please bring your blood sugar monitor to each visit, thank you  Bedtime snack

## 2023-02-06 NOTE — Telephone Encounter (Signed)
Order has been placed on [facility]

## 2023-02-06 NOTE — Telephone Encounter (Signed)
-----   Message from Elayne Snare, MD sent at 02/06/2023  9:31 AM EST ----- Regarding: Dexcom Please send prescription again through [facility] for Dexcom G7, may need to call [employer] and tell them to send it, they did not want to give it to him and told him his sensor will not be accurate on the abdomen which is not true

## 2023-02-06 NOTE — Progress Notes (Signed)
Patient ID: Alan Lopez, male   DOB: 04-07-1946, 77 y.o.   MRN: [id number]            Reason for Appointment: Follow-up for Type 2 Diabetes   History of Present Illness:          Date of diagnosis of type 2 diabetes mellitus :  1999      Past history:  He had modestly increase blood sugars at diagnosis and was started on metformin Subsequently glipizide was added to improve his control. He thinks that his blood sugars have been mostly poorly controlled over the last several years He had been on glipizide and metformin for years without any adjustment of his dosage of glipizide  Review of his A1c shows that it had ranged from 9-9.5 since 07/2013 Previously his PCP tried him on a sample of Jardiance 10 mg without significant improvement  He was started on the V-go pump on 07/02/17 because of inconsistent control and poor compliance with mealtime insulin  Recent history:   Oral hypoglycemic drugs the patient is taking are: Actos 15 mg, Jardiance 25 mg daily  Current INSULIN regimen: V-go pump SETTINGS: Basal rate = 30 units BOLUSES = 4-6 units at breakfast, 6-8 at lunch and  6-10 U at dinner  A1c is 6.8  Fructosamine previously 225  Current blood sugar patterns and problems identified:  He has a slightly better A1c than before He was told to start the Dexcom sensor but he was told by the supplier that the [place] will not be accurate on the stomach and he wants to wear it separately, using the arm for the V-go He has gained weight and this is despite trying to keep portions small This may be related to inactivity during winter months At the highest level his postprandial reading may be in the 170s although may not always check 2 hours later Last night he felt hypoglycemic at 2 AM, had dinner at 6 PM He does take his Jardiance at the 25 mg dose without side effects No hypoglycemia reported   Side effects from medications have been: Mild  diarrhea from regular metformin  Meals  8, am pm 6-7  Glucose monitoring:  done less than once a day usually       Glucometer:   Contour    PRE-MEAL Fasting Lunch Dinner Bedtime Overall  Glucose range: 85-115   135 85-174  Mean/median:     126   POST-MEAL PC Breakfast PC Lunch PC Dinner  Glucose range:  174 172  Mean/median:          Self-care: The diet that the patient has been following is: tries to limit high carbohydrate foods. eating out about 4 times a week      Typical meal intake: Breakfast is  Toast/meat .  Usually 2 servings of vegetables in the evening and avoiding potatoes and bread usually Usually avoiding drinks with sugar                Dietician visit, most recent: 05/2015  CDE visit: 01/2016  Weight history: Maximum weight 215 previously  Wt Readings from Last 3 Encounters:  02/06/23 214 lb (97.1 kg)  10/03/22 207 lb (93.9 kg)  06/06/22 207 lb 3.2 oz (94 kg)    Glycemic control:    Lab Results  Component Value Date   HGBA1C 6.8 (H) 02/04/2023   HGBA1C 7.1 (H) 10/01/2022   HGBA1C 6.3 06/04/2022   Lab Results  Component Value Date   MICROALBUR  1.0 10/01/2022   LDLCALC 42 10/01/2022   CREATININE 0.83 02/04/2023    Lab on 02/04/2023  Component Date Value Ref Range Status   Sodium 02/04/2023 139  135 - 145 mEq/L Final   Potassium 02/04/2023 4.2  3.5 - 5.1 mEq/L Final   Chloride 02/04/2023 108  96 - 112 mEq/L Final   CO2 02/04/2023 25  19 - 32 mEq/L Final   Glucose, Bld 02/04/2023 95  70 - 99 mg/dL Final   BUN 02/04/2023 23  6 - 23 mg/dL Final   Creatinine, Ser 02/04/2023 0.83  0.40 - 1.50 mg/dL Final   GFR 02/04/2023 84.13  >60.00 mL/min Final   Calculated using the CKD-EPI Creatinine Equation (2021)   Calcium 02/04/2023 9.0  8.4 - 10.5 mg/dL Final   Hgb A1c MFr Bld 02/04/2023 6.8 (H)  4.6 - 6.5 % Final   Glycemic Control Guidelines for People with Diabetes:Non Diabetic:  <6%Goal of Therapy: <7%Additional Action Suggested:  >8%    Other active problems: See review of  systems   Allergies as of 02/06/2023       Reactions   Atorvastatin Other (See Comments)   Codeine Other (See Comments)   Patient doesn't like how it makes him feel, "spaced out, not together"        Medication List        Accurate as of February 06, 2023  9:15 AM. If you have any questions, ask your nurse or doctor.          STOP taking these medications    HYDROcodone-acetaminophen 5-325 MG tablet Commonly known as: NORCO/VICODIN Stopped by: Elayne Snare, MD   metFORMIN 500 MG 24 hr tablet Commonly known as: GLUCOPHAGE-XR Stopped by: Elayne Snare, MD       TAKE these medications    acetaminophen 500 MG tablet Commonly known as: TYLENOL Take 500 mg by mouth every 6 (six) hours as needed for mild pain or moderate pain.   alfuzosin 10 MG 24 hr tablet Commonly known as: UROXATRAL Take 1 tablet (10 mg total) by mouth daily with breakfast.   aspirin EC 81 MG tablet Take 81 mg by mouth daily.   cholecalciferol 1000 units tablet Commonly known as: VITAMIN D Take 1,000 Units by mouth daily.   finasteride 5 MG tablet Commonly known as: PROSCAR Take 1 tablet (5 mg total) by mouth daily.   hydroxychloroquine 200 MG tablet Commonly known as: PLAQUENIL Take 200 mg by mouth 2 (two) times daily.   Insulin Pen Needle 32G X 4 MM Misc Use Three per day to inject insulin   insulin regular 100 units/mL injection Commonly known as: NOVOLIN R Inject into the skin 3 (three) times daily before meals. USE INSULIN TO FILL UP v-GO PUMP   Jardiance 25 MG Tabs tablet Generic drug: empagliflozin TAKE 1 TABLET BY MOUTH DAILY   losartan 25 MG tablet Commonly known as: COZAAR Take 1 tablet (25 mg total) by mouth daily.   pioglitazone 15 MG tablet Commonly known as: ACTOS TAKE ONE TABLET BY MOUTH DAILY   rosuvastatin 10 MG tablet Commonly known as: Crestor Take 1 tablet (10 mg total) by mouth at bedtime.   V-Go 30 30 UNIT/24HR Kit Inject 30 Units into the skin daily.         Allergies:  Allergies  Allergen Reactions   Atorvastatin Other (See Comments)   Codeine Other (See Comments)    Patient doesn't like how it makes him feel, "spaced out, not together"  Past Medical History:  Diagnosis Date   Blindness of left eye 04/12/2015   Bradycardia 01/07/2022   Beta-blocker DC'd due to bradycardia.   HTN (hypertension)    Hypertrophy (benign) of prostate    Multinodular goiter 04/12/2015   Non-insulin dependent type 2 diabetes mellitus (HCC)    Status post non-ST elevation myocardial infarction (NSTEMI)    CATH   Tobacco abuse    Type II diabetes mellitus, uncontrolled 04/12/2015    Past Surgical History:  Procedure Laterality Date   CORONARY ANGIOPLASTY WITH STENT PLACEMENT      Family History  Problem Relation Age of Onset   Diabetes Mother    Diabetes Brother    Cancer Brother    Heart disease Neg Hx     Social History:  reports that he has been smoking cigarettes. He has been exposed to tobacco smoke. He has never used smokeless tobacco. He reports current alcohol use of about 10.0 standard drinks of alcohol per week. He reports that he does not use drugs.    Review of Systems     Hypercholesterolemia:  Has history of CAD and myocardial infarction  He is on 10 mg Crestor prescribed by his cardiologist since 09/2018   Labs as follows:   Lab Results  Component Value Date   CHOL 131 10/01/2022   HDL 71.30 10/01/2022   LDLCALC 42 10/01/2022   TRIG 85.0 10/01/2022   CHOLHDL 2 10/01/2022           Eyes: He has had blindness in his left eye from old injury   His last eye exam was in 1/24  THYROID: Previously was found to have a 2 cm nodule on his left thyroid on his exam but ultrasound in 04/2015 showed multinodular goiter with mostly small nodules; the largest nodule on the left side of 1.4 cm has the appearance of a pseudo- nodule  Thyroid levels have been normal consistently  Lab Results  Component Value Date   TSH 1.80  12/18/2020    NEUROPATHY: Has feeling of numbness on the soles of his feet which is not significant now Only occasionally may have a tingling sensation  Blood pressure management: He is on 25 mg losartan from his cardiologist Also on Jardiance 25 mg  BP Readings from Last 3 Encounters:  02/06/23 120/70  10/03/22 (!) 140/80  07/15/22 (!) 152/81   Renal function history  Lab Results  Component Value Date   CREATININE 0.83 02/04/2023   CREATININE 1.04 10/01/2022   CREATININE 0.92 06/04/2022     LABS:  Lab on 02/04/2023  Component Date Value Ref Range Status   Sodium 02/04/2023 139  135 - 145 mEq/L Final   Potassium 02/04/2023 4.2  3.5 - 5.1 mEq/L Final   Chloride 02/04/2023 108  96 - 112 mEq/L Final   CO2 02/04/2023 25  19 - 32 mEq/L Final   Glucose, Bld 02/04/2023 95  70 - 99 mg/dL Final   BUN 02/04/2023 23  6 - 23 mg/dL Final   Creatinine, Ser 02/04/2023 0.83  0.40 - 1.50 mg/dL Final   GFR 02/04/2023 84.13  >60.00 mL/min Final   Calculated using the CKD-EPI Creatinine Equation (2021)   Calcium 02/04/2023 9.0  8.4 - 10.5 mg/dL Final   Hgb A1c MFr Bld 02/04/2023 6.8 (H)  4.6 - 6.5 % Final   Glycemic Control Guidelines for People with Diabetes:Non Diabetic:  <6%Goal of Therapy: <7%Additional Action Suggested:  >8%     Physical Examination:  BP 120/70 (  BP Location: Left Arm, Patient Position: Sitting, Cuff Size: Small)   Pulse 86   Ht 6' (1.829 m)   Wt 214 lb (97.1 kg)   SpO2 95%   BMI 29.02 kg/m      ASSESSMENT:  Diabetes type 2, on insulin  See history of present illness for detailed discussion of his current management, blood sugar patterns and problems identified  A1c is good at 6.8  He is on Jardiance, Actos and insulin with the V-go pump Day-to-day management was discussed as above   PLAN:   Start using Dexcom sensor G7 Reassured him that this is indicated for use only abdomen Meanwhile needs to check blood sugars more consistently including  after meals He does need to start some exercise regimen to help with weight loss No change in insulin regimen but if he continues to get low sugars overnight he may need the lower basal amount Discussed blood sugar targets after meals   Follow-up in 4 months     There are no Patient Instructions on file for this visit.     Elayne Snare 02/06/2023, 9:15 AM   Note: This office note was prepared with Dragon voice recognition system technology. Any transcriptional errors that result from this process are unintentional.

## 2023-02-10 ENCOUNTER — Other Ambulatory Visit: Payer: Self-pay

## 2023-02-10 MED ORDER — EMPAGLIFLOZIN 25 MG PO TABS: 25.0000 mg | ORAL_TABLET | Freq: Every day | ORAL | 3 refills | Status: DC

## 2023-02-12 ENCOUNTER — Encounter: Payer: Self-pay | Admitting: Cardiology

## 2023-02-12 ENCOUNTER — Ambulatory Visit: Payer: Medicare Other | Attending: Cardiology | Admitting: Cardiology

## 2023-02-12 VITALS — BP 110/70 | HR 75 | Ht 72.0 in | Wt 211.8 lb

## 2023-02-12 DIAGNOSIS — E785 Hyperlipidemia, unspecified: Secondary | ICD-10-CM | POA: Diagnosis not present

## 2023-02-12 DIAGNOSIS — I10 Essential (primary) hypertension: Secondary | ICD-10-CM | POA: Insufficient documentation

## 2023-02-12 DIAGNOSIS — I2510 Atherosclerotic heart disease of native coronary artery without angina pectoris: Secondary | ICD-10-CM | POA: Insufficient documentation

## 2023-02-12 LAB — CBC
Hematocrit: 45.9 % (ref 37.5–51.0)
Hemoglobin: 15.7 g/dL (ref 13.0–17.7)
MCH: 32 pg (ref 26.6–33.0)
MCHC: 34.2 g/dL (ref 31.5–35.7)
MCV: 94 fL (ref 79–97)
Platelets: 170 10*3/uL (ref 150–450)
RBC: 4.9 x10E6/uL (ref 4.14–5.80)
RDW: 14.1 % (ref 11.6–15.4)
WBC: 7 10*3/uL (ref 3.4–10.8)

## 2023-02-12 NOTE — Patient Instructions (Signed)
Medication Instructions:   Your physician recommends that you continue on your current medications as directed. Please refer to the Current Medication list given to you today.   *If you need a refill on your cardiac medications before your next appointment, please call your pharmacy*   Lab Work:  TODAY!!! CBC  If you have labs (blood work) drawn today and your tests are completely normal, you will receive your results only by: [place] (if you have MyChart) OR A paper copy in the mail If you have any lab test that is abnormal or we need to change your treatment, we will call you to review the results.   Testing/Procedures:  None Ordered.   Follow-Up: At [facility], you and your health needs are our priority.  As part of our continuing mission to provide you with exceptional heart care, we have created designated Provider Care Teams.  These Care Teams include your primary Cardiologist (physician) and Advanced Practice Providers (APPs -  Physician Assistants and Nurse Practitioners) who all work together to provide you with the care you need, when you need it.  We recommend signing up for the patient portal called "MyChart".  Sign up information is provided on this After Visit Summary.  MyChart is used to connect with patients for Virtual Visits (Telemedicine).  Patients are able to view lab/test results, encounter notes, upcoming appointments, etc.  Non-urgent messages can be sent to your provider as well.   To learn more about what you can do with MyChart, go to [email or web address].    Your next appointment:   1 year(s)  Provider:   Candee Furbish, MD     Other Instructions  Your physician wants you to follow-up in: 1 year with Dr. Marlou Porch.  You will receive a reminder letter in the mail two months in advance. If you don't receive a letter, please call our office to schedule the follow-up appointment.

## 2023-02-12 NOTE — Progress Notes (Signed)
Cardiology Office Note:    Date:  02/12/2023   ID:  Alan Lopez, DOB [birth year], MRN [id number]  PCP:  Alan Lima, MD  [facility] Cardiologist:  Alan Furbish, MD  [facility] Electrophysiologist:  None   Referring MD: Alan Lima, MD     History of Present Illness:    Alan Lopez is a 77 y.o. male here for follow-up of coronary disease hypertension hyperlipidemia with diabetes.  Cardiac catheterization 12/12/2015: Prior DES to right coronary artery in the setting of MI. This was performed at [facility]. Had transient no reflow. LVEDP was 16. EF was 45-50 at the time with inferior wall hypokinesis.   At one point was having full body aches worried about cholesterol medication.  Widowed, wife died of omental cancer 2018-11-02. Alan Lopez is his neighbor, lives at [facility].  While in [place], there for [employer] 1 race, ended up falling, misstep, tore his right rotator cuff.  Has seen Dr. Marlou Lopez.    Overall been doing quite well medications reviewed, easy bruising noted, no chest pain.   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   HTN (hypertension)    Hypertrophy (benign) of prostate    Multinodular goiter 04/12/2015   Non-insulin dependent type 2 diabetes mellitus (HCC)    Status post non-ST elevation myocardial infarction (NSTEMI)    CATH   Tobacco abuse    Type II diabetes mellitus, uncontrolled 04/12/2015    Past Surgical History:  Procedure Laterality Date   CORONARY ANGIOPLASTY WITH STENT PLACEMENT      Current Medications: Current Meds  Medication Sig   acetaminophen (TYLENOL) 500 MG tablet Take 500 mg by mouth every 6 (six) hours as needed for mild pain or moderate pain.   alfuzosin (UROXATRAL) 10 MG 24 hr tablet Take 1 tablet (10 mg total) by mouth daily with breakfast.   aspirin EC 81 MG tablet Take 81 mg by  mouth daily.   cholecalciferol (VITAMIN D) 1000 UNITS tablet Take 1,000 Units by mouth daily.   empagliflozin (JARDIANCE) 25 MG TABS tablet Take 1 tablet (25 mg total) by mouth daily.   finasteride (PROSCAR) 5 MG tablet Take 1 tablet (5 mg total) by mouth daily.   hydroxychloroquine (PLAQUENIL) 200 MG tablet Take 200 mg by mouth 2 (two) times daily.   Insulin Disposable Pump (V-GO 30) 30 UNIT/24HR KIT Inject 30 Units into the skin daily.   Insulin Pen Needle 32G X 4 MM MISC Use Three per day to inject insulin   insulin regular (NOVOLIN R,HUMULIN R) 100 units/mL injection Inject into the skin 3 (three) times daily before meals. USE INSULIN TO FILL UP v-GO PUMP   losartan (COZAAR) 25 MG tablet Take 1 tablet (25 mg total) by mouth daily.   pioglitazone (ACTOS) 15 MG tablet TAKE ONE TABLET BY MOUTH DAILY   rosuvastatin (CRESTOR) 10 MG tablet Take 1 tablet (10 mg total) by mouth at bedtime.     Allergies:   Atorvastatin and Codeine   Social History   Socioeconomic History   Marital status: Widowed    Spouse name: Not on file   Number of children: 3   Years of education: Not on file   Highest education level: Not on file  Occupational History   Occupation: retired  Tobacco Use   Smoking status: Light Smoker    Types: Cigarettes  Passive exposure: Past   Smokeless tobacco: Never   Tobacco comments:    smokes on social occassions  Vaping Use   Vaping Use: Never used  Substance and Sexual Activity   Alcohol use: Yes    Alcohol/week: 10.0 standard drinks of alcohol    Types: 10 Cans of beer per week    Comment: socially   Drug use: No   Sexual activity: Never  Other Topics Concern   Not on file  Social History Narrative   Not on file   Social Determinants of Health   Financial Resource Strain: Low Risk  (01/28/2018)   Overall Financial Resource Strain (CARDIA)    Difficulty of Paying Living Expenses: Not hard at all  Food Insecurity: No Food Insecurity (01/28/2018)   Hunger  Vital Sign    Worried About Running Out of Food in the Last Year: Never true    Ran Out of Food in the Last Year: Never true  Transportation Needs: No Transportation Needs (01/28/2018)   PRAPARE - Hydrologist (Medical): No    Lack of Transportation (Non-Medical): No  Physical Activity: Sufficiently Active (11/14/2022)   Exercise Vital Sign    Days of Exercise per Week: 2 days    Minutes of Exercise per Session: 100 min  Stress: No Stress Concern Present (11/14/2022)   [place]    Feeling of Stress : Not at all  Social Connections: Unknown (11/14/2022)   Social Connection and Isolation Panel [NHANES]    Frequency of Communication with Friends and Family: Not on file    Frequency of Social Gatherings with Friends and Family: Not on file    Attends Religious Services: Not on file    Active Member of Clubs or Organizations: Yes    Attends Archivist Meetings: Not on file    Marital Status: Patient refused     Family History: The patient's family history includes Cancer in his brother; Diabetes in his brother and mother. There is no history of Heart disease.  ROS:   Please see the history of present illness.     All other systems reviewed and are negative.  EKGs/Labs/Other Studies Reviewed:    The following studies were reviewed today:   Cardiac catheterization 12/12/2015 at [facility]: - RCA 80 to 90% lesion, single DES placed.  Also had first diagonal ostial lesion of 40%.  LVEDP 16.  EF 45-50 with inferior wall hypokinesis.  EKG: 02/12/2023-normal sinus rhythm 70 bpm no other abnormalities. Prior sinus rhythm 69 with no other abnormalities.  Recent Labs: 10/01/2022: ALT 33 02/04/2023: BUN 23; Creatinine, Ser 0.83; Potassium 4.2; Sodium 139 02/12/2023: Hemoglobin 15.7; Platelets 170  Recent Lipid Panel    Component Value Date/Time   CHOL 131 10/01/2022 1335    CHOL 120 01/18/2019 0846   TRIG 85.0 10/01/2022 1335   HDL 71.30 10/01/2022 1335   HDL 67 01/18/2019 0846   CHOLHDL 2 10/01/2022 1335   VLDL 17.0 10/01/2022 1335   LDLCALC 42 10/01/2022 1335   LDLCALC 39 01/18/2019 0846     Risk Assessment/Calculations:       Physical Exam:    VS:  BP 110/70   Pulse 75   Ht 6' (1.829 m)   Wt 211 lb 12.8 oz (96.1 kg)   SpO2 96%   BMI 28.73 kg/m     Wt Readings from Last 3 Encounters:  02/12/23 211 lb 12.8 oz (96.1 kg)  02/06/23 214 lb (97.1 kg)  10/03/22 207 lb (93.9 kg)     GEN:  Well nourished, well developed in no acute distress HEENT: Normal NECK: No JVD; No carotid bruits LYMPHATICS: No lymphadenopathy CARDIAC: RRR, no murmurs, rubs, gallops RESPIRATORY:  Clear to auscultation without rales, wheezing or rhonchi  ABDOMEN: Soft, non-tender, non-distended MUSCULOSKELETAL:  No edema; No deformity, limitation in raising his right arm from shoulder SKIN: Warm and dry NEUROLOGIC:  Alert and oriented x 3 PSYCHIATRIC:  Normal affect   ASSESSMENT:    1. Coronary artery disease involving native coronary artery of native heart without angina pectoris   2. Essential hypertension   3. 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  RCA stent.  Initially had marked T wave inversions on ECG. -ASA '81mg'$  -Participated cardiac rehab, [facility]. Overall feels well without any major symptoms.  Doing well no chest pain.  Hyperlipidemia -Changed to Crestor at  one point because of fatigue. Doing very well. LDL at prior check was 38. ALT 41  Diabetes with hyperlipidemia -Dr. Dwyane Dee, Dr. Ronnald Ramp been following. 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  Has glucose monitor right arm.  Bradycardia -Previous visit we stopped his metoprolol because of heart rate of 55. Today his  heart rate is 70. Good response.  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  We will check a CBC.  He is off of Plavix.  Taking aspirin 81 mg only.  1 year follow-up       Medication Adjustments/Labs and Tests Ordered: Current medicines are reviewed at length with the patient today.  Concerns regarding medicines are outlined above.  Orders Placed This Encounter  Procedures   CBC   EKG 12-Lead   No orders of the defined types were placed in this encounter.   Patient Instructions  Medication Instructions:   Your physician recommends that you continue on your current medications as directed. Please refer to the Current Medication list given to you today.   *If you need a refill on your cardiac medications before your next appointment, please call your pharmacy*   Lab Work:  TODAY!!! CBC  If you have labs (blood work) drawn today and your tests are completely normal, you will receive your results only by: [place] (if you have MyChart) OR A paper copy in the mail If you have any lab test that is abnormal or we need to change your treatment, we will call you to review the results.   Testing/Procedures:  None Ordered.   Follow-Up: At [facility], you and your health needs are our priority.  As part of our continuing mission to provide you with exceptional heart care, we have created designated Provider Care Teams.  These Care Teams include your primary Cardiologist (physician) and Advanced Practice Providers (APPs -  Physician Assistants and Nurse Practitioners) who all work together to provide you with the care you need, when you need it.  We recommend signing up for the patient portal called "MyChart".  Sign up information is provided on this After Visit Summary.  MyChart is used to connect with patients for Virtual Visits (Telemedicine).  Patients are able to view lab/test results, encounter notes, upcoming appointments, etc.  Non-urgent messages can  be sent to your provider as well.   To learn more  about what you can do with MyChart, go to [email or web address].    Your next appointment:   1 year(s)  Provider:   Candee Furbish, MD     Other Instructions  Your physician wants you to follow-up in: 1 year with Dr. Marlou Porch.  You will receive a reminder letter in the mail two months in advance. If you don't receive a letter, please call our office to schedule the follow-up appointment.     Signed, Alan Furbish, MD  02/12/2023 1:41 PM    [place]

## 2023-02-15 ENCOUNTER — Other Ambulatory Visit: Payer: Self-pay | Admitting: Endocrinology

## 2023-02-15 DIAGNOSIS — E1165 Type 2 diabetes mellitus with hyperglycemia: Secondary | ICD-10-CM

## 2023-04-14 ENCOUNTER — Other Ambulatory Visit: Payer: Self-pay | Admitting: Endocrinology

## 2023-05-09 ENCOUNTER — Other Ambulatory Visit: Payer: Self-pay | Admitting: Endocrinology

## 2023-05-09 DIAGNOSIS — E1165 Type 2 diabetes mellitus with hyperglycemia: Secondary | ICD-10-CM

## 2023-06-02 ENCOUNTER — Other Ambulatory Visit (INDEPENDENT_AMBULATORY_CARE_PROVIDER_SITE_OTHER): Payer: Medicare Other

## 2023-06-02 DIAGNOSIS — Z794 Long term (current) use of insulin: Secondary | ICD-10-CM

## 2023-06-02 DIAGNOSIS — E1165 Type 2 diabetes mellitus with hyperglycemia: Secondary | ICD-10-CM

## 2023-06-02 LAB — COMPREHENSIVE METABOLIC PANEL
ALT: 38 U/L (ref 0–53)
AST: 29 U/L (ref 0–37)
Albumin: 3.7 g/dL (ref 3.5–5.2)
Alkaline Phosphatase: 55 U/L (ref 39–117)
BUN: 18 mg/dL (ref 6–23)
CO2: 23 mEq/L (ref 19–32)
Calcium: 8.5 mg/dL (ref 8.4–10.5)
Chloride: 107 mEq/L (ref 96–112)
Creatinine, Ser: 0.92 mg/dL (ref 0.40–1.50)
GFR: 79.78 mL/min (ref >60.00)
Glucose, Bld: 142 mg/dL — ABNORMAL HIGH (ref 70–99)
Potassium: 4.1 mEq/L (ref 3.5–5.1)
Sodium: 137 mEq/L (ref 135–145)
Total Bilirubin: 0.8 mg/dL (ref 0.2–1.2)
Total Protein: 6.1 g/dL (ref 6.0–8.3)

## 2023-06-02 LAB — HEMOGLOBIN A1C: Hgb A1c MFr Bld: 7 % — ABNORMAL HIGH (ref 4.6–6.5)

## 2023-06-05 ENCOUNTER — Encounter: Payer: Self-pay | Admitting: Endocrinology

## 2023-06-05 ENCOUNTER — Ambulatory Visit (INDEPENDENT_AMBULATORY_CARE_PROVIDER_SITE_OTHER): Payer: Medicare Other | Admitting: Endocrinology

## 2023-06-05 VITALS — BP 121/70 | HR 69 | Ht 72.0 in | Wt 209.0 lb

## 2023-06-05 DIAGNOSIS — I10 Essential (primary) hypertension: Secondary | ICD-10-CM | POA: Diagnosis not present

## 2023-06-05 DIAGNOSIS — E1165 Type 2 diabetes mellitus with hyperglycemia: Secondary | ICD-10-CM | POA: Diagnosis not present

## 2023-06-05 DIAGNOSIS — E7800 Pure hypercholesterolemia, unspecified: Secondary | ICD-10-CM

## 2023-06-05 DIAGNOSIS — Z794 Long term (current) use of insulin: Secondary | ICD-10-CM | POA: Diagnosis not present

## 2023-06-05 NOTE — Patient Instructions (Addendum)
Use Dexcom on stomach, let us know if we need to help you get started  Change the V-go pump every 24 hours regardless of how much insulin is left  Check blood sugars on waking up at least 3 days a week  Also check blood sugars about 2 hours after meals and do this after different meals by rotation  Recommended blood sugar levels on waking up are 90-130 and about 2 hours after meal is 130-160  Please bring your blood sugar monitor to each visit, thank you

## 2023-06-05 NOTE — Progress Notes (Signed)
Patient ID: Alan Lopez, male   DOB: 07-Oct-1946, 77 y.o.   MRN: [id number]            Reason for Appointment: Follow-up for Type 2 Diabetes   History of Present Illness:          Date of diagnosis of type 2 diabetes mellitus :  1999      Past history:  He had modestly increase blood sugars at diagnosis and was started on metformin Subsequently glipizide was added to improve his control. He thinks that his blood sugars have been mostly poorly controlled over the last several years He had been on glipizide and metformin for years without any adjustment of his dosage of glipizide  Review of his A1c shows that it had ranged from 9-9.5 since 07/2013 Previously his PCP tried him on a sample of Jardiance 10 mg without significant improvement  He was started on the V-go pump on 07/02/17 because of inconsistent control and poor compliance with mealtime insulin  Recent history:   Oral hypoglycemic drugs the patient is taking are: Actos 15 mg, Jardiance 25 mg daily  Current INSULIN regimen: V-go pump SETTINGS: Basal rate = 30 units BOLUSES = 4-6 units at breakfast, 6-8 at lunch and  6-10 U at dinner  INSULIN type: Regular insulin  A1c is 7 compared to 6.8  Fructosamine previously 225  Current blood sugar patterns and problems identified:  He has a slightly higher A1c than before He was again told to start the Dexcom sensor using his abdomen but he still did not understand this and has not obtained this, he wants to use his arms only for the V-go pump He did not bring his monitor for download today Usually monitors blood sugars very infrequently and mostly in the mornings However with being more active outside he has lost 5 pounds since her last visit He says that only when he does not take enough insulin to cover his meals or carbohydrates his sugars may be higher up to 280 but otherwise well-controlled when checked Sometimes when he is more active he will feel hypoglycemic during  the day and will treat this with some juice or hard candy He now says that he only changes his insulin pump when he sees the insulin running out and not every 24 hours He does take his Jardiance at the 25 mg dose without side effects Generally is taking the same amount of boluses as before  Side effects from medications have been: Mild  diarrhea from regular metformin  Meals 8, am pm 6-7  Glucose monitoring:  done less than once a day usually       Glucometer:   Contour   Blood sugars by recall:  PRE-MEAL Fasting Lunch Dinner Bedtime Overall  Glucose range: 99-135      Mean/median:        POST-MEAL PC Breakfast PC Lunch PC   Glucose range:   140-160  Mean/median:       Previously  PRE-MEAL Fasting Lunch Dinner Bedtime Overall  Glucose range: 85-115   135 85-174  Mean/median:     126   POST-MEAL PC Breakfast PC Lunch PC Dinner  Glucose range:  174 172  Mean/median:          Self-care: The diet that the patient has been following is: tries to limit high carbohydrate foods. eating out about 4 times a week      Typical meal intake: Breakfast is  Toast/meat .  Usually 2 servings  of vegetables in the evening and avoiding potatoes and bread usually Usually avoiding drinks with sugar                Dietician visit, most recent: 05/2015  CDE visit: 01/2016  Weight history: Maximum weight 215 previously  Wt Readings from Last 3 Encounters:  06/05/23 209 lb (94.8 kg)  02/12/23 211 lb 12.8 oz (96.1 kg)  02/06/23 214 lb (97.1 kg)    Glycemic control:    Lab Results  Component Value Date   HGBA1C 7.0 (H) 06/02/2023   HGBA1C 6.8 (H) 02/04/2023   HGBA1C 7.1 (H) 10/01/2022   Lab Results  Component Value Date   MICROALBUR 1.0 10/01/2022   LDLCALC 42 10/01/2022   CREATININE 0.92 06/02/2023    Lab on 06/02/2023  Component Date Value Ref Range Status   Sodium 06/02/2023 137  135 - 145 mEq/L Final   Potassium 06/02/2023 4.1  3.5 - 5.1 mEq/L Final   Chloride 06/02/2023  107  96 - 112 mEq/L Final   CO2 06/02/2023 23  19 - 32 mEq/L Final   Glucose, Bld 06/02/2023 142 (H)  70 - 99 mg/dL Final   BUN 53/66/4403 18  6 - 23 mg/dL Final   Creatinine, Ser 06/02/2023 0.92  0.40 - 1.50 mg/dL Final   Total Bilirubin 06/02/2023 0.8  0.2 - 1.2 mg/dL Final   Alkaline Phosphatase 06/02/2023 55  39 - 117 U/L Final   AST 06/02/2023 29  0 - 37 U/L Final   ALT 06/02/2023 38  0 - 53 U/L Final   Total Protein 06/02/2023 6.1  6.0 - 8.3 g/dL Final   Albumin 47/42/5956 3.7  3.5 - 5.2 g/dL Final   GFR 38/75/6433 79.78  >60.00 mL/min Final   Calculated using the CKD-EPI Creatinine Equation (2021)   Calcium 06/02/2023 8.5  8.4 - 10.5 mg/dL Final   Hgb I9J MFr Bld 06/02/2023 7.0 (H)  4.6 - 6.5 % Final   Glycemic Control Guidelines for People with Diabetes:Non Diabetic:  <6%Goal of Therapy: <7%Additional Action Suggested:  >8%    Other active problems: See review of systems   Allergies as of 06/05/2023       Reactions   Atorvastatin Other (See Comments)   Codeine Other (See Comments)   Patient doesn't like how it makes him feel, "spaced out, not together"        Medication List        Accurate as of June 05, 2023 10:22 AM. If you have any questions, ask your nurse or doctor.          acetaminophen 500 MG tablet Commonly known as: TYLENOL Take 500 mg by mouth every 6 (six) hours as needed for mild pain or moderate pain.   alfuzosin 10 MG 24 hr tablet Commonly known as: UROXATRAL Take 1 tablet (10 mg total) by mouth daily with breakfast.   aspirin EC 81 MG tablet Take 81 mg by mouth daily.   cholecalciferol 1000 units tablet Commonly known as: VITAMIN D Take 1,000 Units by mouth daily.   empagliflozin 25 MG Tabs tablet Commonly known as: Jardiance Take 1 tablet (25 mg total) by mouth daily.   finasteride 5 MG tablet Commonly known as: PROSCAR Take 1 tablet (5 mg total) by mouth daily.   hydroxychloroquine 200 MG tablet Commonly known as:  PLAQUENIL Take 200 mg by mouth 2 (two) times daily.   Insulin Pen Needle 32G X 4 MM Misc Use Three per day to inject  insulin   insulin regular 100 units/mL injection Commonly known as: NOVOLIN R Inject into the skin 3 (three) times daily before meals. USE INSULIN TO FILL UP v-GO PUMP, use 3-5 units   losartan 25 MG tablet Commonly known as: COZAAR Take 1 tablet (25 mg total) by mouth daily.   pioglitazone 15 MG tablet Commonly known as: ACTOS TAKE ONE TABLET BY MOUTH DAILY   rosuvastatin 10 MG tablet Commonly known as: Crestor Take 1 tablet (10 mg total) by mouth at bedtime.   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   HTN (hypertension)    Hypertrophy (benign) of prostate    Multinodular goiter 04/12/2015   Non-insulin dependent type 2 diabetes mellitus (HCC)    Status post non-ST elevation myocardial infarction (NSTEMI)    CATH   Tobacco abuse    Type II diabetes mellitus, uncontrolled 04/12/2015    Past Surgical History:  Procedure Laterality Date   CORONARY ANGIOPLASTY WITH STENT PLACEMENT      Family History  Problem Relation Age of Onset   Diabetes Mother    Diabetes Brother    Cancer Brother    Heart disease Neg Hx     Social History:  reports that he has been smoking cigarettes. He has a 20.00 pack-year smoking history. He has been exposed to tobacco smoke. He has never used smokeless tobacco. He reports current alcohol use of about 10.0 standard drinks of alcohol per week. He reports that he does not use drugs.    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   His last eye exam was in 1/24  THYROID: Previously was found to have a 2 cm nodule on his left thyroid on his exam but ultrasound in 04/2015 showed multinodular goiter with mostly small nodules; the largest nodule on the left side of 1.4 cm has the appearance of a pseudo- nodule  Thyroid levels have been normal consistently  Lab Results  Component Value Date   TSH 1.80 12/18/2020    NEUROPATHY: Has feeling of numbness on the soles of his feet which is not significant now Only occasionally may have a tingling sensation  Blood pressure management: He is on 25 mg losartan from his cardiologist Also on Jardiance 25 mg  BP Readings from Last 3 Encounters:  06/05/23 121/70  02/12/23 110/70  02/06/23 120/70   Renal function history  Lab Results  Component Value Date   CREATININE 0.92 06/02/2023   CREATININE 0.83 02/04/2023   CREATININE 1.04 10/01/2022     LABS:  Lab on 06/02/2023  Component Date  Value Ref Range Status   Sodium 06/02/2023 137  135 - 145 mEq/L Final   Potassium 06/02/2023 4.1  3.5 - 5.1 mEq/L Final   Chloride 06/02/2023 107  96 - 112 mEq/L Final   CO2 06/02/2023 23  19 - 32 mEq/L Final   Glucose, Bld 06/02/2023 142 (H)  70 - 99 mg/dL Final   BUN 14/78/2956 18  6 - 23 mg/dL Final   Creatinine, Ser 06/02/2023 0.92  0.40 - 1.50 mg/dL Final   Total Bilirubin 06/02/2023 0.8  0.2 - 1.2 mg/dL Final   Alkaline Phosphatase 06/02/2023 55  39 - 117 U/L Final   AST 06/02/2023 29  0 - 37 U/L Final   ALT 06/02/2023 38  0 - 53 U/L Final   Total Protein 06/02/2023 6.1  6.0 - 8.3 g/dL Final   Albumin 21/30/8657 3.7  3.5 - 5.2 g/dL  Final   GFR 84/69/6295 79.78  >60.00 mL/min Final   Calculated using the CKD-EPI Creatinine Equation (2021)   Calcium 06/02/2023 8.5  8.4 - 10.5 mg/dL Final   Hgb M8U MFr Bld 06/02/2023 7.0 (H)  4.6 - 6.5 % Final   Glycemic Control Guidelines for People with Diabetes:Non Diabetic:  <6%Goal of Therapy: <7%Additional Action Suggested:  >8%     Physical Examination:  BP 121/70 (BP Location: Left Arm, Patient Position: Sitting)   Pulse 69   Ht 6' (1.829 m)   Wt 209 lb (94.8 kg)   SpO2 95%   BMI 28.35 kg/m      ASSESSMENT:  Diabetes type 2, on insulin  See history of present illness for detailed discussion of his current management, blood sugar patterns and problems identified  A1c is fair at 7, prior to 6.8  He is on Jardiance, Actos and insulin with the V-go pump  He did not bring his meter and glucose patterns were not assessed   PLAN:   Start using Dexcom sensor G7 Reassured him that this is appropriate for use on his abdomen He will let us know if he needs any help starting this after getting the supplies Also needs to check blood sugars more consistently with his meter in the meantime especially after meals Make sure he changes the insulin pump consistently every morning at the same time Make sure he is limiting excessive carbohydrates and covering all meals adequately bolus clicks Also to call if he starts getting any low sugars overnight   Follow-up in 4 months   Patient Instructions  Use Dexcom on stomach, let us know if we need to help you get started  Change the V-go pump every 24 hours regardless of how much insulin is left  Check blood sugars on waking up at least 3 days a week  Also check blood sugars about 2 hours after meals and do this after different meals by rotation  Recommended blood sugar levels on waking up are 90-130 and about 2 hours after meal is 130-160  Please bring your blood sugar monitor to each visit, thank you      Reather Littler 06/05/2023, 10:22 AM   Note: This office note was prepared with Dragon voice recognition system technology. Any transcriptional errors that result from this process are unintentional.

## 2023-06-08 ENCOUNTER — Other Ambulatory Visit: Payer: Self-pay | Admitting: Endocrinology

## 2023-06-10 ENCOUNTER — Ambulatory Visit: Payer: Medicare Other | Admitting: Cardiology

## 2023-07-04 ENCOUNTER — Other Ambulatory Visit: Payer: Medicare Other

## 2023-07-04 DIAGNOSIS — R972 Elevated prostate specific antigen [PSA]: Secondary | ICD-10-CM | POA: Diagnosis not present

## 2023-07-09 ENCOUNTER — Ambulatory Visit (INDEPENDENT_AMBULATORY_CARE_PROVIDER_SITE_OTHER): Payer: Medicare Other | Admitting: Urology

## 2023-07-09 VITALS — BP 103/69 | HR 85

## 2023-07-09 DIAGNOSIS — R972 Elevated prostate specific antigen [PSA]: Secondary | ICD-10-CM

## 2023-07-09 DIAGNOSIS — N138 Other obstructive and reflux uropathy: Secondary | ICD-10-CM

## 2023-07-09 DIAGNOSIS — N401 Enlarged prostate with lower urinary tract symptoms: Secondary | ICD-10-CM

## 2023-07-09 DIAGNOSIS — R351 Nocturia: Secondary | ICD-10-CM

## 2023-07-09 LAB — URINALYSIS, ROUTINE W REFLEX MICROSCOPIC
Bilirubin, UA: NEGATIVE
Ketones, UA: NEGATIVE
Leukocytes,UA: NEGATIVE
Nitrite, UA: NEGATIVE
Protein,UA: NEGATIVE
RBC, UA: NEGATIVE
Specific Gravity, UA: 1.015 (ref 1.005–1.030)
Urobilinogen, Ur: 0.2 mg/dL (ref 0.2–1.0)
pH, UA: 5.5 (ref 5.0–7.5)

## 2023-07-09 MED ORDER — FINASTERIDE 5 MG PO TABS: 5.0000 mg | ORAL_TABLET | Freq: Every day | ORAL | 3 refills | Status: DC

## 2023-07-09 MED ORDER — ALFUZOSIN HCL ER 10 MG PO TB24: 10.0000 mg | ORAL_TABLET | Freq: Every day | ORAL | 3 refills | Status: DC

## 2023-07-09 NOTE — Progress Notes (Signed)
07/09/2023 10:54 AM   Pernell Dupre 1946-07-03 [id number]  Referring provider: Etta Grandchild, MD [address] [place],  [place] [address]  Followup elevated PSA and BPH   HPI: Mr Alan Lopez is a 77yo here for followup for elevated PSa and BPH. PSA decreased to 10.2 from 10.6. IPSS 8 QOL 2 on uroxatral and finasteride. Nocturia 2-3x depending on tea intake. Uirne stream strong. No straining to urinate. 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   HTN (hypertension)    Hypertrophy (benign) of prostate    Multinodular goiter 04/12/2015   Non-insulin dependent type 2 diabetes mellitus (HCC)    Status post non-ST elevation myocardial infarction (NSTEMI)    CATH   Tobacco abuse    Type II diabetes mellitus, uncontrolled 04/12/2015    Surgical History: Past Surgical History:  Procedure Laterality Date   CORONARY ANGIOPLASTY WITH STENT PLACEMENT      Home Medications:  Allergies as of 07/09/2023       Reactions   Atorvastatin Other (See Comments)   Codeine Other (See Comments)   Patient doesn't like how it makes him feel, "spaced out, not together"        Medication List        Accurate as of July 09, 2023 10:54 AM. If you have any questions, ask your nurse or doctor.          acetaminophen 500 MG tablet Commonly known as: TYLENOL Take 500 mg by mouth every 6 (six) hours as needed for mild pain or moderate pain.   alfuzosin 10 MG 24 hr tablet Commonly known as: UROXATRAL Take 1 tablet (10 mg total) by mouth daily with breakfast.   aspirin EC 81 MG tablet Take 81 mg by mouth daily.   cholecalciferol 1000 units tablet Commonly known as: VITAMIN D Take 1,000 Units by mouth daily.   finasteride 5 MG tablet Commonly known as: PROSCAR Take 1 tablet (5 mg total) by mouth daily.   hydroxychloroquine 200 MG tablet Commonly known  as: PLAQUENIL Take 200 mg by mouth 2 (two) times daily.   Insulin Pen Needle 32G X 4 MM Misc Use Three per day to inject insulin   insulin regular 100 units/mL injection Commonly known as: NOVOLIN R Inject into the skin 3 (three) times daily before meals. USE INSULIN TO FILL UP v-GO PUMP, use 3-5 units   Jardiance 25 MG Tabs tablet Generic drug: empagliflozin TAKE 1 TABLET BY MOUTH DAILY   losartan 25 MG tablet Commonly known as: COZAAR Take 1 tablet (25 mg total) by mouth daily.   pioglitazone 15 MG tablet Commonly known as: ACTOS TAKE ONE TABLET BY MOUTH DAILY   rosuvastatin 10 MG tablet Commonly known as: Crestor Take 1 tablet (10 mg total) by mouth at bedtime.   V-Go 30 30 UNIT/24HR Kit USE AS DIRECTED TO INJECT UP TO 30 UNITS OF INSULIN DAILY        Allergies:  Allergies  Allergen Reactions   Atorvastatin Other (See Comments)   Codeine Other (See Comments)    Patient doesn't like how it makes him feel, "spaced out, not together"    Family History: Family History  Problem Relation Age of Onset   Diabetes Mother    Diabetes Brother    Cancer Brother    Heart disease Neg Hx     Social  History:  reports that he has been smoking cigarettes. He has a 20 pack-year smoking history. He has been exposed to tobacco smoke. He has never used smokeless tobacco. He reports current alcohol use of about 10.0 standard drinks of alcohol per week. He reports that he does not use drugs.  ROS: All other review of systems were reviewed and are negative except what is noted above in HPI  Physical Exam: BP 103/69   Pulse 85   Constitutional:  Alert and oriented, No acute distress. HEENT: [place] AT, moist mucus membranes.  Trachea midline, no masses. Cardiovascular: No clubbing, cyanosis, or edema. Respiratory: Normal respiratory effort, no increased work of breathing. GI: Abdomen is soft, nontender, nondistended, no abdominal masses GU: No CVA tenderness.  Lymph: No cervical  or inguinal lymphadenopathy. Skin: No rashes, bruises or suspicious lesions. Neurologic: Grossly intact, no focal deficits, moving all 4 extremities. Psychiatric: Normal mood and affect.  Laboratory Data: Lab Results  Component Value Date   WBC 7.0 02/12/2023   HGB 15.7 02/12/2023   HCT 45.9 02/12/2023   MCV 94 02/12/2023   PLT 170 02/12/2023    Lab Results  Component Value Date   CREATININE 0.92 06/02/2023    No results found for: "PSA"  No results found for: "TESTOSTERONE"  Lab Results  Component Value Date   HGBA1C 7.0 (H) 06/02/2023    Urinalysis    Component Value Date/Time   COLORURINE YELLOW 01/29/2017 1024   APPEARANCEUR Clear 01/18/2022 1105   LABSPEC 1.020 01/29/2017 1024   PHURINE 6.0 01/29/2017 1024   GLUCOSEU 2+ (A) 01/18/2022 1105   GLUCOSEU NEGATIVE 01/29/2017 1024   HGBUR NEGATIVE 01/29/2017 1024   BILIRUBINUR Negative 01/18/2022 1105   KETONESUR NEGATIVE 01/29/2017 1024   PROTEINUR Negative 01/18/2022 1105   UROBILINOGEN 0.2 01/29/2017 1024   NITRITE Negative 01/18/2022 1105   NITRITE NEGATIVE 01/29/2017 1024   LEUKOCYTESUR Negative 01/18/2022 1105    Lab Results  Component Value Date   LABMICR Comment 01/18/2022   WBCUA None seen 07/18/2021   LABEPIT None seen 07/18/2021   MUCUS Present 07/18/2021   BACTERIA None seen 07/18/2021    Pertinent Imaging:  No results found for this or any previous visit.  No results found for this or any previous visit.  No results found for this or any previous visit.  No results found for this or any previous visit.  No results found for this or any previous visit.  No valid procedures specified. No results found for this or any previous visit.  No results found for this or any previous visit.   Assessment & Plan:    1. Elevated PSA -followup 1 year with a PSA - Urinalysis, Routine w reflex microscopic  2. Benign prostatic hyperplasia with urinary obstruction Continue uroxatral and  finasteride  3. Nocturia Continue uroxatral and finasteride   No follow-ups on file.  Wilkie Aye, MD  [facility]

## 2023-07-11 ENCOUNTER — Ambulatory Visit: Payer: Medicare Other | Admitting: Urology

## 2023-07-21 DIAGNOSIS — L920 Granuloma annulare: Secondary | ICD-10-CM | POA: Diagnosis not present

## 2023-07-21 DIAGNOSIS — Z79899 Other long term (current) drug therapy: Secondary | ICD-10-CM | POA: Diagnosis not present

## 2023-07-31 ENCOUNTER — Encounter: Payer: Self-pay | Admitting: Urology

## 2023-07-31 NOTE — Patient Instructions (Signed)

## 2023-08-21 DIAGNOSIS — Z79899 Other long term (current) drug therapy: Secondary | ICD-10-CM | POA: Diagnosis not present

## 2023-08-21 DIAGNOSIS — L920 Granuloma annulare: Secondary | ICD-10-CM | POA: Diagnosis not present

## 2023-09-04 DIAGNOSIS — Z79899 Other long term (current) drug therapy: Secondary | ICD-10-CM | POA: Diagnosis not present

## 2023-09-18 ENCOUNTER — Other Ambulatory Visit: Payer: Self-pay

## 2023-09-18 DIAGNOSIS — E1165 Type 2 diabetes mellitus with hyperglycemia: Secondary | ICD-10-CM

## 2023-09-18 MED ORDER — V-GO 30 30 UNIT/24HR KIT: 30.0000 [IU] | PACK | Freq: Every day | 1 refills | Status: DC

## 2023-09-19 DIAGNOSIS — Z79899 Other long term (current) drug therapy: Secondary | ICD-10-CM | POA: Diagnosis not present

## 2023-09-22 ENCOUNTER — Ambulatory Visit (INDEPENDENT_AMBULATORY_CARE_PROVIDER_SITE_OTHER): Payer: Medicare Other | Admitting: Family Medicine

## 2023-09-22 ENCOUNTER — Encounter: Payer: Self-pay | Admitting: Family Medicine

## 2023-09-22 VITALS — BP 142/74 | HR 90 | Temp 98.4°F | Resp 20 | Ht 72.0 in | Wt 209.0 lb

## 2023-09-22 DIAGNOSIS — J22 Unspecified acute lower respiratory infection: Secondary | ICD-10-CM | POA: Diagnosis not present

## 2023-09-22 DIAGNOSIS — B9689 Other specified bacterial agents as the cause of diseases classified elsewhere: Secondary | ICD-10-CM

## 2023-09-22 MED ORDER — AMOXICILLIN-POT CLAVULANATE 875-125 MG PO TABS
1.0000 | ORAL_TABLET | Freq: Two times a day (BID) | ORAL | 0 refills | Status: AC
Start: 2023-09-22 — End: 2023-09-29

## 2023-09-22 MED ORDER — ALBUTEROL SULFATE HFA 108 (90 BASE) MCG/ACT IN AERS
2.0000 | INHALATION_SPRAY | Freq: Four times a day (QID) | RESPIRATORY_TRACT | 0 refills | Status: DC | PRN
Start: 2023-09-22 — End: 2024-03-29

## 2023-09-22 MED ORDER — METHYLPREDNISOLONE 4 MG PO TBPK
ORAL_TABLET | ORAL | 0 refills | Status: DC
Start: 2023-09-22 — End: 2023-10-28

## 2023-09-22 NOTE — Progress Notes (Signed)
Assessment & Plan:  1. Bacterial lower respiratory infection - albuterol (VENTOLIN HFA) 108 (90 Base) MCG/ACT inhaler; Inhale 2 puffs into the lungs every 6 (six) hours as needed for wheezing or shortness of breath.  Dispense: 18 g; Refill: 0 - amoxicillin-clavulanate (AUGMENTIN) 875-125 MG tablet; Take 1 tablet by mouth 2 (two) times daily for 7 days.  Dispense: 14 tablet; Refill: 0 - methylPREDNISolone (MEDROL DOSEPAK) 4 MG TBPK tablet; Use as directed  Dispense: 21 each; Refill: 0   No results found for any visits on 09/22/23.  Follow up plan: Return if symptoms worsen or fail to improve.  Deliah [place], MSN, APRN, FNP-C  Subjective:  HPI: Alan Lopez is a 77 y.o. male presenting on 09/22/2023 for Cough (And wheezing x 2 weeks. SOB x 1 week. /Taking OTC Mucinex, not helping much/Neg Covid home test yesterday )  Patient complains of cough, shortness of breath, and wheezing. He denies head congestion, sneezing, sore throat, and fever. Onset of symptoms was 2 weeks ago, gradually worsening since that time. He is drinking plenty of fluids. Evaluation to date: At home COVID test negative. Treatment to date:  Mucinex .  He does smoke.    ROS: Negative unless specifically indicated above in HPI.   Relevant past medical history reviewed and updated as indicated.   Allergies and medications reviewed and updated.   Current Outpatient Medications:    acetaminophen (TYLENOL) 500 MG tablet, Take 500 mg by mouth every 6 (six) hours as needed for mild pain or moderate pain., Disp: , Rfl:    alfuzosin (UROXATRAL) 10 MG 24 hr tablet, Take 1 tablet (10 mg total) by mouth daily with breakfast., Disp: 90 tablet, Rfl: 3   aspirin EC 81 MG tablet, Take 81 mg by mouth daily., Disp: , Rfl:    cetirizine (ZYRTEC) 10 MG tablet, Take 10 mg by mouth daily., Disp: , Rfl:    cholecalciferol (VITAMIN D) 1000 UNITS tablet, Take 1,000 Units by mouth daily., Disp: , Rfl:    dapsone 100 MG tablet, Take  100 mg by mouth daily., Disp: , Rfl:    finasteride (PROSCAR) 5 MG tablet, Take 1 tablet (5 mg total) by mouth daily., Disp: 90 tablet, Rfl: 3   Insulin Disposable Pump (V-GO 30) 30 UNIT/24HR KIT, 30 Units by Does not apply route daily at 6 (six) AM. Use as directed to inject up to 30 units of insulin daily, Disp: 30 kit, Rfl: 1   Insulin Pen Needle 32G X 4 MM MISC, Use Three per day to inject insulin, Disp: 100 each, Rfl: 1   insulin regular (NOVOLIN R,HUMULIN R) 100 units/mL injection, Inject into the skin 3 (three) times daily before meals. USE INSULIN TO FILL UP v-GO PUMP, use 3-5 units, Disp: , Rfl:    JARDIANCE 25 MG TABS tablet, TAKE 1 TABLET BY MOUTH DAILY, Disp: 30 tablet, Rfl: 3   losartan (COZAAR) 25 MG tablet, Take 1 tablet (25 mg total) by mouth daily., Disp: 90 tablet, Rfl: 3   Multiple Vitamin (MULTIVITAMIN WITH MINERALS) TABS tablet, Take 1 tablet by mouth daily., Disp: , Rfl:    pioglitazone (ACTOS) 15 MG tablet, TAKE ONE TABLET BY MOUTH DAILY, Disp: 90 tablet, Rfl: 0   rosuvastatin (CRESTOR) 10 MG tablet, Take 1 tablet (10 mg total) by mouth at bedtime., Disp: 90 tablet, Rfl: 3  Allergies  Allergen Reactions   Atorvastatin Other (See Comments)   Codeine Other (See Comments)    Patient doesn't like how it  makes him feel, "spaced out, not together"    Objective:   BP (!) 142/74   Pulse 90   Temp 98.4 F (36.9 C)   Resp 20   Ht 6' (1.829 m)   Wt 209 lb (94.8 kg)   SpO2 (!) 87% Comment: recheck was 92  BMI 28.35 kg/m    Physical Exam Vitals reviewed.  Constitutional:      General: He is not in acute distress.    Appearance: Normal appearance. He is not ill-appearing, toxic-appearing or diaphoretic.  HENT:     Head: Normocephalic and atraumatic.     Right Ear: Tympanic membrane, ear canal and external ear normal. There is no impacted cerumen.     Left Ear: Tympanic membrane, ear canal and external ear normal. There is no impacted cerumen.     Nose: Nose normal.      Right Sinus: No maxillary sinus tenderness or frontal sinus tenderness.     Left Sinus: No maxillary sinus tenderness or frontal sinus tenderness.     Mouth/Throat:     Mouth: Mucous membranes are moist.     Pharynx: Oropharynx is clear. No oropharyngeal exudate or posterior oropharyngeal erythema.     Tonsils: No tonsillar exudate or tonsillar abscesses.  Eyes:     General: No scleral icterus.       Right eye: No discharge.        Left eye: No discharge.     Conjunctiva/sclera: Conjunctivae normal.  Neck:     Vascular: No carotid bruit.  Cardiovascular:     Rate and Rhythm: Normal rate.  Pulmonary:     Effort: Pulmonary effort is normal. No respiratory distress.  Musculoskeletal:        General: Normal range of motion.     Cervical back: Normal range of motion.  Lymphadenopathy:     Cervical: No cervical adenopathy.  Skin:    General: Skin is warm and dry.  Neurological:     Mental Status: He is alert and oriented to person, place, and time. Mental status is at baseline.  Psychiatric:        Mood and Affect: Mood normal.        Behavior: Behavior normal.        Thought Content: Thought content normal.        Judgment: Judgment normal.

## 2023-09-22 NOTE — Patient Instructions (Signed)
Blood pressure goal 150/90

## 2023-10-01 ENCOUNTER — Other Ambulatory Visit (INDEPENDENT_AMBULATORY_CARE_PROVIDER_SITE_OTHER): Payer: Medicare Other

## 2023-10-01 ENCOUNTER — Other Ambulatory Visit: Payer: Self-pay

## 2023-10-01 DIAGNOSIS — E1165 Type 2 diabetes mellitus with hyperglycemia: Secondary | ICD-10-CM | POA: Diagnosis not present

## 2023-10-01 DIAGNOSIS — Z794 Long term (current) use of insulin: Secondary | ICD-10-CM

## 2023-10-01 DIAGNOSIS — E7800 Pure hypercholesterolemia, unspecified: Secondary | ICD-10-CM

## 2023-10-01 DIAGNOSIS — I10 Essential (primary) hypertension: Secondary | ICD-10-CM

## 2023-10-01 LAB — COMPREHENSIVE METABOLIC PANEL
ALT: 44 U/L (ref 0–53)
AST: 26 U/L (ref 0–37)
Albumin: 4 g/dL (ref 3.5–5.2)
Alkaline Phosphatase: 67 U/L (ref 39–117)
BUN: 22 mg/dL (ref 6–23)
CO2: 24 meq/L (ref 19–32)
Calcium: 8.6 mg/dL (ref 8.4–10.5)
Chloride: 106 meq/L (ref 96–112)
Creatinine, Ser: 0.84 mg/dL (ref 0.40–1.50)
GFR: 83.45 mL/min (ref >60.00)
Glucose, Bld: 208 mg/dL — ABNORMAL HIGH (ref 70–99)
Potassium: 4.1 meq/L (ref 3.5–5.1)
Sodium: 137 meq/L (ref 135–145)
Total Bilirubin: 1.1 mg/dL (ref 0.2–1.2)
Total Protein: 6.5 g/dL (ref 6.0–8.3)

## 2023-10-01 LAB — LIPID PANEL
Cholesterol: 128 mg/dL (ref 0–200)
HDL: 67.2 mg/dL (ref >39.00)
LDL Cholesterol: 36 mg/dL (ref 0–99)
NonHDL: 60.42
Total CHOL/HDL Ratio: 2
Triglycerides: 120 mg/dL (ref 0.0–149.0)
VLDL: 24 mg/dL (ref 0.0–40.0)

## 2023-10-01 LAB — HEMOGLOBIN A1C: Hgb A1c MFr Bld: 6.2 % (ref 4.6–6.5)

## 2023-10-01 LAB — MICROALBUMIN / CREATININE URINE RATIO
Creatinine,U: 40.7 mg/dL
Microalb Creat Ratio: 1.7 mg/g (ref 0.0–30.0)
Microalb, Ur: <0.7 mg/dL (ref 0.0–1.9)

## 2023-10-07 ENCOUNTER — Encounter: Payer: Self-pay | Admitting: Endocrinology

## 2023-10-07 ENCOUNTER — Ambulatory Visit (INDEPENDENT_AMBULATORY_CARE_PROVIDER_SITE_OTHER): Payer: Medicare Other | Admitting: Endocrinology

## 2023-10-07 VITALS — BP 142/80 | HR 75 | Resp 20 | Ht 72.0 in | Wt 207.8 lb

## 2023-10-07 DIAGNOSIS — E118 Type 2 diabetes mellitus with unspecified complications: Secondary | ICD-10-CM | POA: Diagnosis not present

## 2023-10-07 DIAGNOSIS — Z794 Long term (current) use of insulin: Secondary | ICD-10-CM | POA: Diagnosis not present

## 2023-10-07 NOTE — Progress Notes (Signed)
Outpatient Endocrinology Note [place] Iveliz Garay, MD  10/07/23  Patient's Name: Alan Lopez    DOB: December 16, 1945    MRN: [id number]                                                    REASON OF VISIT: Follow up of type 2 diabetes mellitus  PCP: Etta Grandchild, MD  HISTORY OF PRESENT ILLNESS:   Alan Lopez is a 77 y.o. old male with past medical history listed below, is here for follow up for type 2 diabetes mellitus.   Pertinent Diabetes History: Patient was diagnosed with type 2 diabetes mellitus in 1999.  He was started on V-Go pump in July 2018 because of inconsistent control and poor compliance with mealtime insulin.  Chronic Diabetes Complications : Retinopathy: no. Last ophthalmology exam was done on annually, following with ophthalmology regularly. 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  Nephropathy: no, on ACE/ARB /losartan. Peripheral neuropathy: yes numbness and tingling of the sole. Coronary artery disease: yes Stroke: no  Relevant comorbidities and cardiovascular risk factors: Obesity: no Body mass index is 28.18 kg/m.  Hypertension: Yes  Hyperlipidemia : Yes, on statin   Current / Home Diabetic regimen includes:  Actos 15 mg daily. Jardiance 25 mg daily. Current INSULIN regimen: V-go pump SETTINGS: Basal rate = 30 units BOLUSES = 6-8 units at breakfast, 6-8 units at lunch and  6-8 units at dinner   INSULIN type: Regular insulin  Prior diabetic medications: Glipizide, metformin in the past.  Diarrhea with metformin.  Glycemic data:    Forget to bring glucometer in the clinic today.  He has been checking couple of times a week.  He does not recall the glucose numbers.  CGM/Dexcom was discussed in the past.  Patient reports that he had adhesive problem in the abdomen.  Hypoglycemia: Patient has no / denies hypoglycemic episodes. Patient has hypoglycemia awareness.  Factors modifying glucose control: 1.  Diabetic diet assessment: 3 meals a day.   He tried to limit high carb meals.  2.  Staying active or exercising:   3.  Medication compliance: compliant all of the time.  THYROID: Previously was found to have a 2 cm nodule on his left thyroid on his exam but ultrasound in 04/2015 showed multinodular goiter with mostly small nodules; the largest nodule on the left side of 1.4 cm has the appearance of a pseudo- nodule   Thyroid levels have been normal consistently  Interval history  Diabetes regimen as reviewed above.  Denies any numbness or tingling the feet.  No vision change.  He forgot to bring the glucometer in the clinic today.  Hemoglobin A1c 6.2% recently.  No other complaints today.  REVIEW OF SYSTEMS As per history of present illness.   PAST MEDICAL HISTORY: Past Medical History:  Diagnosis Date   Blindness of left eye 04/12/2015   Bradycardia 01/07/2022   Beta-blocker DC'd due to bradycardia.   HTN (hypertension)    Hypertrophy (benign) of prostate    Multinodular goiter 04/12/2015   Non-insulin dependent type 2 diabetes mellitus (HCC)    Status post non-ST elevation myocardial infarction (NSTEMI)    CATH   Tobacco abuse    Type II diabetes mellitus, uncontrolled 04/12/2015    PAST SURGICAL HISTORY: Past Surgical History:  Procedure Laterality Date   CORONARY ANGIOPLASTY  WITH STENT PLACEMENT      ALLERGIES: Allergies  Allergen Reactions   Atorvastatin Other (See Comments)   Codeine Other (See Comments)    Patient doesn't like how it makes him feel, "spaced out, not together"    FAMILY HISTORY:  Family History  Problem Relation Age of Onset   Diabetes Mother    Diabetes Brother    Cancer Brother    Heart disease Neg Hx     SOCIAL HISTORY: Social History   Socioeconomic History   Marital status: Widowed    Spouse name: Not on file   Number of children: 3   Years of education: Not on file   Highest education level: Not on file  Occupational History   Occupation: retired  Tobacco Use   Smoking  status: Light Smoker    Current packs/day: 0.50    Average packs/day: 0.5 packs/day for 40.0 years (20.0 ttl pk-yrs)    Types: Cigarettes    Passive exposure: Past   Smokeless tobacco: Never   Tobacco comments:    smokes on social occassions  Vaping Use   Vaping status: Never Used  Substance and Sexual Activity   Alcohol use: Yes    Alcohol/week: 10.0 standard drinks of alcohol    Types: 10 Cans of beer per week    Comment: socially   Drug use: No   Sexual activity: Never  Other Topics Concern   Not on file  Social History Narrative   Not on file   Social Determinants of Health   Financial Resource Strain: Low Risk  (01/28/2018)   Overall Financial Resource Strain (CARDIA)    Difficulty of Paying Living Expenses: Not hard at all  Food Insecurity: No Food Insecurity (01/28/2018)   Hunger Vital Sign    Worried About Running Out of Food in the Last Year: Never true    Ran Out of Food in the Last Year: Never true  Transportation Needs: No Transportation Needs (01/28/2018)   PRAPARE - Administrator, Civil Service (Medical): No    Lack of Transportation (Non-Medical): No  Physical Activity: Sufficiently Active (11/14/2022)   Exercise Vital Sign    Days of Exercise per Week: 2 days    Minutes of Exercise per Session: 100 min  Stress: No Stress Concern Present (11/14/2022)   [employer] of Occupational Health - Occupational Stress Questionnaire    Feeling of Stress : Not at all  Social Connections: Unknown (11/14/2022)   Social Connection and Isolation Panel [NHANES]    Frequency of Communication with Friends and Family: Not on file    Frequency of Social Gatherings with Friends and Family: Not on file    Attends Religious Services: Not on file    Active Member of Clubs or Organizations: Yes    Attends Banker Meetings: Not on file    Marital Status: Patient declined    MEDICATIONS:  Current Outpatient Medications  Medication Sig Dispense Refill    acetaminophen (TYLENOL) 500 MG tablet Take 500 mg by mouth every 6 (six) hours as needed for mild pain or moderate pain.     albuterol (VENTOLIN HFA) 108 (90 Base) MCG/ACT inhaler Inhale 2 puffs into the lungs every 6 (six) hours as needed for wheezing or shortness of breath. 18 g 0   alfuzosin (UROXATRAL) 10 MG 24 hr tablet Take 1 tablet (10 mg total) by mouth daily with breakfast. 90 tablet 3   aspirin EC 81 MG tablet Take 81 mg by mouth  daily.     cetirizine (ZYRTEC) 10 MG tablet Take 10 mg by mouth daily.     cholecalciferol (VITAMIN D) 1000 UNITS tablet Take 1,000 Units by mouth daily.     dapsone 100 MG tablet Take 100 mg by mouth daily.     finasteride (PROSCAR) 5 MG tablet Take 1 tablet (5 mg total) by mouth daily. 90 tablet 3   Insulin Disposable Pump (V-GO 30) 30 UNIT/24HR KIT 30 Units by Does not apply route daily at 6 (six) AM. Use as directed to inject up to 30 units of insulin daily 30 kit 1   Insulin Pen Needle 32G X 4 MM MISC Use Three per day to inject insulin 100 each 1   insulin regular (NOVOLIN R,HUMULIN R) 100 units/mL injection Inject into the skin 3 (three) times daily before meals. USE INSULIN TO FILL UP v-GO PUMP, use 3-5 units     JARDIANCE 25 MG TABS tablet TAKE 1 TABLET BY MOUTH DAILY 30 tablet 3   losartan (COZAAR) 25 MG tablet Take 1 tablet (25 mg total) by mouth daily. 90 tablet 3   methylPREDNISolone (MEDROL DOSEPAK) 4 MG TBPK tablet Use as directed 21 each 0   Multiple Vitamin (MULTIVITAMIN WITH MINERALS) TABS tablet Take 1 tablet by mouth daily.     pioglitazone (ACTOS) 15 MG tablet TAKE ONE TABLET BY MOUTH DAILY 90 tablet 0   rosuvastatin (CRESTOR) 10 MG tablet Take 1 tablet (10 mg total) by mouth at bedtime. 90 tablet 3   No current facility-administered medications for this visit.    PHYSICAL EXAM: Vitals:   10/07/23 0920  BP: (!) 142/80  Pulse: 75  Resp: 20  SpO2: 92%  Weight: 207 lb 12.8 oz (94.3 kg)  Height: 6' (1.829 m)   Body mass index is  28.18 kg/m.  Wt Readings from Last 3 Encounters:  10/07/23 207 lb 12.8 oz (94.3 kg)  09/22/23 209 lb (94.8 kg)  06/05/23 209 lb (94.8 kg)    General: Well developed, well nourished male in no apparent distress.  HEENT: AT/[place], no external lesions.  Eyes: Conjunctiva clear and no icterus. Neck: Neck supple  Lungs: Respirations not labored Neurologic: Alert, oriented, normal speech Extremities / Skin: Dry.   Psychiatric: Does not appear depressed or anxious  Diabetic Foot Exam - Simple   No data filed    LABS Reviewed Lab Results  Component Value Date   HGBA1C 6.2 10/01/2023   HGBA1C 7.0 (H) 06/02/2023   HGBA1C 6.8 (H) 02/04/2023   Lab Results  Component Value Date   FRUCTOSAMINE 225 05/01/2020   FRUCTOSAMINE 294 (H) 06/30/2017   FRUCTOSAMINE 255 09/09/2016   Lab Results  Component Value Date   CHOL 128 10/01/2023   HDL 67.20 10/01/2023   LDLCALC 36 10/01/2023   TRIG 120.0 10/01/2023   CHOLHDL 2 10/01/2023   Lab Results  Component Value Date   MICRALBCREAT 1.7 10/01/2023   MICRALBCREAT 1.3 10/01/2022   Lab Results  Component Value Date   CREATININE 0.84 10/01/2023   Lab Results  Component Value Date   GFR 83.45 10/01/2023    ASSESSMENT / PLAN  1. Controlled type 2 diabetes mellitus with complication, with long-term current use of insulin (HCC)     Diabetes Mellitus type 2, complicated by CAD/peripheral neuropathy. - Diabetic status / severity: Controlled  Lab Results  Component Value Date   HGBA1C 6.2 10/01/2023    - Hemoglobin A1c goal : <7%  - Medications: See below.  No  change.  Actos 15 mg daily. Jardiance 25 mg daily. Current INSULIN regimen: V-go pump SETTINGS: Basal rate = 30 units BOLUSES = 6-8 units at breakfast, 6-8 units at lunch and  6-8 units at dinner   INSULIN type: Regular insulin  Discussed about using continuous glucose monitoring.  Patient is not ready at this time.  He will think about it.  - Home glucose testing:  Before meals and at bedtime.  Asked to bring glucometer in the follow-up visit.  Encouraged to use CGM. - Discussed/ Gave Hypoglycemia treatment plan.  # Consult : not required at this time.   # Annual urine for microalbuminuria/ creatinine ratio, no microalbuminuria currently, continue ACE/ARB /losartan. Last  Lab Results  Component Value Date   MICRALBCREAT 1.7 10/01/2023    # Foot check nightly / neuropathy.  # Annual dilated diabetic eye exams.   - Diet: Make healthy diabetic food choices - Life style / activity / exercise: Discussed.  2. Blood pressure  -  BP Readings from Last 1 Encounters:  10/07/23 (!) 142/80    - Control is in target.  Mildly high.  - No change in current plans.  3. Lipid status / Hyperlipidemia - Last  Lab Results  Component Value Date   LDLCALC 36 10/01/2023   - Continue rosuvastatin 20 mg daily.    Diagnoses and all orders for this visit:  Controlled type 2 diabetes mellitus with complication, with long-term current use of insulin (HCC) -     Basic metabolic panel; Future -     Hemoglobin A1c; Future    DISPOSITION Follow up in clinic in 3 months suggested.   All questions answered and patient verbalized understanding of the plan.  [place] Janilah Hojnacki, MD [facility] [address] [place], [address] [place], [place] [address] Phone # [phone]  At least part of this note was generated using voice recognition software. Inadvertent word errors may have occurred, which were not recognized during the proofreading process.

## 2023-10-08 ENCOUNTER — Other Ambulatory Visit: Payer: Self-pay

## 2023-10-08 DIAGNOSIS — E118 Type 2 diabetes mellitus with unspecified complications: Secondary | ICD-10-CM

## 2023-10-08 MED ORDER — EMPAGLIFLOZIN 25 MG PO TABS: 25.0000 mg | ORAL_TABLET | Freq: Every day | ORAL | 3 refills | Status: DC

## 2023-10-22 DIAGNOSIS — Z79899 Other long term (current) drug therapy: Secondary | ICD-10-CM | POA: Diagnosis not present

## 2023-10-22 DIAGNOSIS — L920 Granuloma annulare: Secondary | ICD-10-CM | POA: Diagnosis not present

## 2023-10-28 ENCOUNTER — Ambulatory Visit (INDEPENDENT_AMBULATORY_CARE_PROVIDER_SITE_OTHER): Payer: Medicare Other

## 2023-10-28 ENCOUNTER — Encounter: Payer: Self-pay | Admitting: Internal Medicine

## 2023-10-28 ENCOUNTER — Ambulatory Visit (INDEPENDENT_AMBULATORY_CARE_PROVIDER_SITE_OTHER): Payer: Medicare Other | Admitting: Internal Medicine

## 2023-10-28 VITALS — BP 124/70 | HR 90 | Temp 98.3°F | Ht 72.0 in | Wt 210.4 lb

## 2023-10-28 DIAGNOSIS — R058 Other specified cough: Secondary | ICD-10-CM | POA: Insufficient documentation

## 2023-10-28 DIAGNOSIS — J411 Mucopurulent chronic bronchitis: Secondary | ICD-10-CM | POA: Diagnosis not present

## 2023-10-28 DIAGNOSIS — R918 Other nonspecific abnormal finding of lung field: Secondary | ICD-10-CM | POA: Diagnosis not present

## 2023-10-28 DIAGNOSIS — R059 Cough, unspecified: Secondary | ICD-10-CM | POA: Diagnosis not present

## 2023-10-28 DIAGNOSIS — R0602 Shortness of breath: Secondary | ICD-10-CM | POA: Insufficient documentation

## 2023-10-28 MED ORDER — SPIRIVA RESPIMAT 2.5 MCG/ACT IN AERS: 2.0000 | INHALATION_SPRAY | Freq: Every day | RESPIRATORY_TRACT | 0 refills | Status: DC

## 2023-10-28 NOTE — Progress Notes (Signed)
Subjective:  Patient ID: Alan Lopez, male    DOB: Oct 30, 1946  Age: 77 y.o. MRN: [id number]  CC: Cough   HPI [name] presents for f/up ---  Discussed the use of AI scribe software for clinical note transcription with the patient, who gave verbal consent to proceed.  History of Present Illness   The patient presents with a chief complaint of shortness of breath and wheezing, which has been progressively worsening over a few years. The wheezing is a more recent symptom. They also report a morning cough, which they attribute to nasal drip. The cough is productive, with the patient describing the sputum as thickened mucus, occasionally of a yellowish color. They deny any chest pain, dysphagia, or odynophagia.  The patient was previously prescribed an inhaler and a steroid Dosepak for increased congestion a few weeks prior to this consultation. They report that the medication provided some relief, but the symptoms have since returned to their baseline. They have not been using the inhaler recently and have completed the course of steroids.  The patient denies any systemic symptoms such as fever, chills, night sweats, loss of appetite, or weight loss. In fact, they report a slight weight gain. They also deny any symptoms suggestive of a recent cardiac event.  The patient was previously prescribed antibiotics, the details of which are not specified.   The patient's oxygen levels were found to be low during this consultation, prompting the need for further investigation with a chest x-ray.       Outpatient Medications Prior to Visit  Medication Sig Dispense Refill   acetaminophen (TYLENOL) 500 MG tablet Take 500 mg by mouth every 6 (six) hours as needed for mild pain or moderate pain.     albuterol (VENTOLIN HFA) 108 (90 Base) MCG/ACT inhaler Inhale 2 puffs into the lungs every 6 (six) hours as needed for wheezing or shortness of breath. 18 g 0   alfuzosin (UROXATRAL) 10 MG 24 hr  tablet Take 1 tablet (10 mg total) by mouth daily with breakfast. 90 tablet 3   aspirin EC 81 MG tablet Take 81 mg by mouth daily.     cetirizine (ZYRTEC) 10 MG tablet Take 10 mg by mouth daily.     cholecalciferol (VITAMIN D) 1000 UNITS tablet Take 1,000 Units by mouth daily.     dapsone 100 MG tablet Take 100 mg by mouth daily.     empagliflozin (JARDIANCE) 25 MG TABS tablet Take 1 tablet (25 mg total) by mouth daily. 30 tablet 3   finasteride (PROSCAR) 5 MG tablet Take 1 tablet (5 mg total) by mouth daily. 90 tablet 3   Insulin Disposable Pump (V-GO 30) 30 UNIT/24HR KIT 30 Units by Does not apply route daily at 6 (six) AM. Use as directed to inject up to 30 units of insulin daily 30 kit 1   Insulin Pen Needle 32G X 4 MM MISC Use Three per day to inject insulin 100 each 1   insulin regular (NOVOLIN R,HUMULIN R) 100 units/mL injection Inject into the skin 3 (three) times daily before meals. USE INSULIN TO FILL UP v-GO PUMP, use 3-5 units     losartan (COZAAR) 25 MG tablet Take 1 tablet (25 mg total) by mouth daily. 90 tablet 3   Multiple Vitamin (MULTIVITAMIN WITH MINERALS) TABS tablet Take 1 tablet by mouth daily.     pioglitazone (ACTOS) 15 MG tablet TAKE ONE TABLET BY MOUTH DAILY 90 tablet 0   rosuvastatin (CRESTOR) 10 MG tablet  Take 1 tablet (10 mg total) by mouth at bedtime. 90 tablet 3   methylPREDNISolone (MEDROL DOSEPAK) 4 MG TBPK tablet Use as directed 21 each 0   No facility-administered medications prior to visit.    ROS Review of Systems  Constitutional:  Negative for chills, diaphoresis, fatigue and fever.  HENT: Negative.  Negative for sinus pain, sore throat and trouble swallowing.   Respiratory:  Positive for cough, shortness of breath and wheezing. Negative for choking, chest tightness and stridor.   Cardiovascular:  Negative for chest pain, palpitations and leg swelling.  Gastrointestinal: Negative.  Negative for abdominal pain, diarrhea and nausea.  Genitourinary:   Negative for difficulty urinating.  Musculoskeletal: Negative.  Negative for arthralgias and myalgias.  Skin: Negative.   Neurological: Negative.  Negative for dizziness and weakness.  Hematological:  Negative for adenopathy. Does not bruise/bleed easily.  Psychiatric/Behavioral: Negative.      Objective:  BP 124/70 (BP Location: Left Arm, Patient Position: Sitting, Cuff Size: Normal)   Pulse 90   Temp 98.3 F (36.8 C) (Oral)   Ht 6' (1.829 m)   Wt 210 lb 6.4 oz (95.4 kg)   SpO2 (!) 88%   BMI 28.54 kg/m   BP Readings from Last 3 Encounters:  10/28/23 124/70  10/07/23 (!) 142/80  09/22/23 (!) 142/74    Wt Readings from Last 3 Encounters:  10/28/23 210 lb 6.4 oz (95.4 kg)  10/07/23 207 lb 12.8 oz (94.3 kg)  09/22/23 209 lb (94.8 kg)    Physical Exam Vitals reviewed.  Constitutional:      Appearance: Normal appearance.  HENT:     Mouth/Throat:     Mouth: Mucous membranes are moist.  Eyes:     General: No scleral icterus.    Conjunctiva/sclera: Conjunctivae normal.  Cardiovascular:     Rate and Rhythm: Normal rate and regular rhythm.     Heart sounds: No murmur heard.    No friction rub. No gallop.     Comments: EKG- NSR, 77 bpm No LVH, Q waves, or ST/T waves  Pulmonary:     Effort: Pulmonary effort is normal.     Breath sounds: No stridor. No wheezing, rhonchi or rales.  Abdominal:     General: Abdomen is flat.     Palpations: There is no mass.     Tenderness: There is no abdominal tenderness. There is no guarding.     Hernia: No hernia is present.  Musculoskeletal:        General: Normal range of motion.     Cervical back: Neck supple.     Right lower leg: No edema.     Left lower leg: No edema.  Lymphadenopathy:     Cervical: No cervical adenopathy.  Skin:    General: Skin is warm and dry.     Findings: No rash.  Neurological:     General: No focal deficit present.     Mental Status: He is alert. Mental status is at baseline.  Psychiatric:         Mood and Affect: Mood normal.        Behavior: Behavior normal.     Lab Results  Component Value Date   WBC 7.0 02/12/2023   HGB 15.7 02/12/2023   HCT 45.9 02/12/2023   PLT 170 02/12/2023   GLUCOSE 208 (H) 10/01/2023   CHOL 128 10/01/2023   TRIG 120.0 10/01/2023   HDL 67.20 10/01/2023   LDLCALC 36 10/01/2023   ALT 44 10/01/2023  AST 26 10/01/2023   NA 137 10/01/2023   K 4.1 10/01/2023   CL 106 10/01/2023   CREATININE 0.84 10/01/2023   BUN 22 10/01/2023   CO2 24 10/01/2023   TSH 1.80 12/18/2020   HGBA1C 6.2 10/01/2023   MICROALBUR <0.7 10/01/2023    DG Toe Great Right  Result Date: 07/15/2022 CLINICAL DATA:  Fall. EXAM: RIGHT GREAT TOE COMPARISON:  None Available. FINDINGS: Probable minimally displaced cortical fracture is seen involving the distal portion of the first metatarsal. Moderate degenerative changes seen involving the first interphalangeal joint. Soft tissues are unremarkable. 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 Probable minimally displaced cortical fracture involving the distal first metatarsal. Electronically Signed   By: Lupita Raider M.D.   On: 07/15/2022 15:56   DG Ankle Complete Right  Result Date: 07/15/2022 CLINICAL DATA:  Right ankle pain after fall. EXAM: RIGHT ANKLE - COMPLETE 3+ VIEW COMPARISON:  None Available. FINDINGS: There is no evidence of fracture, dislocation, or joint effusion. There is no evidence of arthropathy or other focal bone abnormality. Soft tissues are unremarkable. IMPRESSION: Negative. Electronically Signed   By: Lupita Raider M.D.   On: 07/15/2022 15:53   DG Wrist Complete Left  Result Date: 07/15/2022 CLINICAL DATA:  Trauma, fall EXAM: LEFT WRIST - COMPLETE 3+ VIEW COMPARISON:  None Available. FINDINGS: There is cortical irregularity in the dorsal aspect of 1 of the carpals seen in the lateral view. No displaced fracture or dislocation is seen. Chondrocalcinosis is seen in the radiocarpal  joint. There are no opaque foreign bodies. IMPRESSION: There is cortical irregularity in 1 of the carpals in the dorsal aspect of the wrist seen in the lateral projection. This may suggest recent or old a nondisplaced fracture. No recent displaced fracture is seen. 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 Electronically Signed   By: Ernie Avena M.D.   On: 07/15/2022 15:16    DG Chest 2 View  Result Date: 10/28/2023 CLINICAL DATA:  Short of breath, cough EXAM: CHEST - 2 VIEW COMPARISON:  None Available. FINDINGS: Frontal and lateral views of the chest demonstrate an unremarkable cardiac silhouette. Hyperinflation and background parenchymal lung scarring without acute airspace disease, effusion, or pneumothorax. No acute bony abnormalities. IMPRESSION: 1. Findings consistent with emphysema.  No acute airspace disease. Electronically Signed   By: Sharlet Salina M.D.   On: 10/28/2023 15:09     Assessment & Plan:   SOB (shortness of breath) on exertion- EKG is normal. -     EKG 12-Lead  Productive cough- CXR is negative for mass/infiltrate. -     DG Chest 2 View; Future  Mucopurulent chronic bronchitis (HCC)- Will treat with a LAMA. -     Spiriva Respimat; Inhale 2 puffs into the lungs daily.  Dispense: 12 g; Refill: 0     Follow-up: No follow-ups on file.  Sanda Linger, MD

## 2023-12-01 ENCOUNTER — Encounter: Payer: Self-pay | Admitting: Internal Medicine

## 2023-12-04 ENCOUNTER — Other Ambulatory Visit: Payer: Self-pay | Admitting: Internal Medicine

## 2023-12-15 ENCOUNTER — Other Ambulatory Visit: Payer: Self-pay | Admitting: Internal Medicine

## 2023-12-18 ENCOUNTER — Other Ambulatory Visit: Payer: Self-pay | Admitting: Endocrinology

## 2023-12-18 DIAGNOSIS — E1165 Type 2 diabetes mellitus with hyperglycemia: Secondary | ICD-10-CM

## 2023-12-22 ENCOUNTER — Ambulatory Visit
Admission: EM | Admit: 2023-12-22 | Discharge: 2023-12-22 | Disposition: A | Discharge status: Home / Self Care | Payer: Medicare Other | Attending: Nurse Practitioner | Admitting: Nurse Practitioner

## 2023-12-22 DIAGNOSIS — R059 Cough, unspecified: Secondary | ICD-10-CM | POA: Diagnosis not present

## 2023-12-22 DIAGNOSIS — J069 Acute upper respiratory infection, unspecified: Secondary | ICD-10-CM

## 2023-12-22 DIAGNOSIS — J439 Emphysema, unspecified: Secondary | ICD-10-CM

## 2023-12-22 MED ORDER — FLUTICASONE PROPIONATE 50 MCG/ACT NA SUSP: 2.0000 | Freq: Every day | NASAL | 0 refills | Status: AC

## 2023-12-22 MED ORDER — AZITHROMYCIN 250 MG PO TABS: 250.0000 mg | ORAL_TABLET | Freq: Every day | ORAL | 0 refills | Status: DC

## 2023-12-22 MED ORDER — PREDNISONE 20 MG PO TABS: 40.0000 mg | ORAL_TABLET | Freq: Every day | ORAL | 0 refills | Status: AC

## 2023-12-22 NOTE — Discharge Instructions (Addendum)
 Take medication as prescribed. Increase fluids and allow for plenty of rest. May take over-the-counter Tylenol  as needed for pain, fever, or general discomfort. Recommend using a humidifier in your bedroom at nighttime during sleep and sleeping elevated on pillows while cough symptoms persist. May use normal saline nasal spray throughout the day for nasal congestion and runny nose. If you experience worsening shortness of breath, difficulty breathing, fatigue, or other concerns, please go to the emergency department immediately for further evaluation. Would like for you to speak with your PCP regarding smoking cessation.  I do encourage you to try to completely stop smoking while you are currently experiencing symptoms. Recommend following up with your PCP within the next 7 to 10 days for reevaluation. Follow-up as needed.

## 2023-12-22 NOTE — ED Provider Notes (Signed)
 [facility]    CSN: [id number] Arrival date & time: 12/22/23  1248      History   Chief Complaint Chief Complaint  Patient presents with   Cough    HPI Alan Lopez is a 78 y.o. male.   The history is provided by the patient.   Patient presents with a 1 week history of cough, bilateral ear fullness, nasal congestion, fatigue, and some shortness of breath with exertion.  Patient with underlying history of emphysema.  He also is still continuing to smoke.  Denies fever, chills, sore throat, headache, wheezing, difficulty breathing, chest pain, abdominal pain, nausea, vomiting, diarrhea, or rash.  Patient reports he has been taking over-the-counter Mucinex, NyQuil, Tylenol , and vitamin C for his symptoms.  Room air sats at 91%.  Patient reports this is baseline for him.  Reviewed patient's chart, and found that baseline O2 sats ranged between 89 to 92%.  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   Essential hypertension 04/13/2019   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 Authorizing Provider  alfuzosin  (UROXATRAL ) 10 MG 24 hr tablet Take 1 tablet (10 mg total) by mouth daily with breakfast. 07/09/23  Yes McKenzie, Belvie CROME, MD  aspirin EC 81 MG tablet Take 81 mg by mouth daily.   Yes [provider]  azithromycin  (ZITHROMAX ) 250 MG tablet Take 1 tablet (250 mg total) by mouth daily. Take first 2 tablets together, then 1 every day until finished. 12/22/23  Yes Leath-Warren, Etta PARAS, NP  cetirizine (ZYRTEC) 10 MG tablet Take 10 mg by mouth daily.   Yes [provider]  cholecalciferol (VITAMIN D) 1000 UNITS tablet Take 1,000 Units by mouth daily.   Yes [provider]  dapsone 100 MG tablet Take 100 mg by mouth daily. 09/15/23  Yes [provider]  empagliflozin  (JARDIANCE ) 25 MG TABS tablet Take 1 tablet (25 mg total) by mouth daily. 10/08/23  Yes Thapa, Sudan, MD  finasteride  (PROSCAR ) 5 MG tablet Take 1 tablet (5 mg total) by mouth daily. 07/09/23  Yes McKenzie, Belvie CROME, MD  fluticasone  (FLONASE ) 50 MCG/ACT nasal  spray Place 2 sprays into both nostrils daily. 12/22/23  Yes Leath-Warren, Etta PARAS, NP  Insulin  Disposable Pump (V-GO 30) 30 UNIT/24HR KIT USE AS DIRECT TO INJECT UP TO 30 UNITS AT 6:00AM DAILY 12/18/23  Yes Thapa, [place], MD  insulin  regular (NOVOLIN R,HUMULIN R ) 100 units/mL injection Inject into the skin 3 (three) times daily before meals. USE INSULIN  TO FILL UP v-GO PUMP, use 3-5 units   Yes [provider]  losartan  (COZAAR ) 25 MG tablet Take 1  tablet (25 mg total) by mouth daily. 12/27/21  Yes Jeffrie Oneil BROCKS, MD  Multiple Vitamin (MULTIVITAMIN WITH MINERALS) TABS tablet Take 1 tablet by mouth daily.   Yes [provider]  pioglitazone  (ACTOS ) 15 MG tablet TAKE ONE TABLET BY MOUTH DAILY 04/14/23  Yes Von [place], MD  predniSONE  (DELTASONE ) 20 MG tablet Take 2 tablets (40 mg total) by mouth daily with breakfast for 5 days. 12/22/23 12/27/23 Yes Leath-Warren, Etta PARAS, NP  rosuvastatin  (CRESTOR ) 10 MG tablet Take 1 tablet (10 mg total) by mouth at bedtime. 01/07/22  Yes Jeffrie Oneil BROCKS, MD  Tiotropium Bromide Monohydrate  (SPIRIVA  RESPIMAT) 2.5 MCG/ACT AERS Inhale 2 puffs into the lungs daily. 10/28/23  Yes Joshua Debby CROME, MD  acetaminophen  (TYLENOL ) 500 MG tablet Take 500 mg by mouth every 6 (six) hours as needed for mild pain or moderate pain.    [provider]  albuterol  (VENTOLIN  HFA) 108 (90 Base) MCG/ACT inhaler Inhale 2 puffs into the lungs every 6 (six) hours as needed for wheezing or shortness of breath. 09/22/23   Merlynn Niki FALCON, FNP  Insulin  Pen Needle 32G X 4 MM MISC Use Three per day to inject insulin  08/09/19   Von Pacific, MD    Family History Family History  Problem Relation Age of Onset   Diabetes Mother    Diabetes Brother    Cancer Brother    Heart disease Neg Hx     Social History Social History   Tobacco Use   Smoking status: Light Smoker    Current packs/day: 0.50    Average packs/day: 0.5 packs/day for 40.0 years (20.0 ttl pk-yrs)    Types: Cigarettes    Passive exposure: Past   Smokeless tobacco: Never   Tobacco comments:    smokes on social occassions  Vaping Use   Vaping status: Never Used  Substance Use Topics   Alcohol use: Yes    Alcohol/week: 10.0 standard drinks of alcohol    Types: 10 Cans of beer per week    Comment: socially   Drug use: No     Allergies   Atorvastatin  and Codeine   Review of Systems Review of Systems Per HPI  Physical Exam Triage Vital  Signs ED Triage Vitals [12/22/23 1333]  Encounter Vitals Group     BP 120/74     Systolic BP Percentile      Diastolic BP Percentile      Pulse Rate 72     Resp 20     Temp 97.9 F (36.6 C)     Temp Source Oral     SpO2 (!) 89 %     Weight      Height      Head Circumference      Peak Flow      Pain Score 0     Pain Loc      Pain Education      Exclude from Growth Chart    No data found.  Updated Vital  Signs BP 120/74 (BP Location: Right Arm)   Pulse 72   Temp 97.9 F (36.6 C) (Oral)   Resp 20   SpO2 91%   Visual Acuity Right Eye Distance:   Left Eye Distance:   Bilateral Distance:    Right Eye Near:   Left Eye Near:    Bilateral Near:     Physical Exam Vitals and nursing note reviewed.  Constitutional:      General: He is not in acute distress.    Appearance: Normal appearance.  HENT:     Head: Normocephalic.     Right Ear: Tympanic membrane, ear canal and external ear normal.     Left Ear: Tympanic membrane, ear canal and external ear normal.     Nose: Congestion present.     Mouth/Throat:     Mouth: Mucous membranes are moist.     Pharynx: No posterior oropharyngeal erythema.     Comments: Cobblestoning present to posterior oropharynx  Eyes:     Extraocular Movements: Extraocular movements intact.     Conjunctiva/sclera: Conjunctivae normal.     Pupils: Pupils are equal, round, and reactive to light.  Cardiovascular:     Rate and Rhythm: Normal rate and regular rhythm.     Pulses: Normal pulses.     Heart sounds: Normal heart sounds.  Pulmonary:     Effort: Pulmonary effort is normal. No respiratory distress.     Breath sounds: Normal breath sounds. No stridor. No wheezing, rhonchi or rales.  Abdominal:     General: Bowel sounds are normal.     Palpations: Abdomen is soft.     Tenderness: There is no abdominal tenderness.  Musculoskeletal:     Cervical back: Normal range of motion.  Lymphadenopathy:     Cervical: No cervical adenopathy.   Skin:    General: Skin is warm and dry.  Neurological:     General: No focal deficit present.     Mental Status: He is alert and oriented to person, place, and time.  Psychiatric:        Mood and Affect: Mood normal.        Behavior: Behavior normal.      UC Treatments / Results  Labs (all labs ordered are listed, but only abnormal results are displayed) Labs Reviewed - No data to display  EKG   Radiology No results found.  Procedures Procedures (including critical care time)  Medications Ordered in UC Medications - No data to display  Initial Impression / Assessment and Plan / UC Course  I have reviewed the triage vital signs and the nursing notes.  Pertinent labs & imaging results that were available during my care of the patient were reviewed by me and considered in my medical decision making (see chart for details).  Patient with lingering cough is been present for approximately 1 week.  On exam, lung sounds are clear throughout.  Baseline sats remained between 90 to 92%.  Patient is speaking in complete sentences without difficulty.  Patient declines chest x-ray at this time, states that he did have 1 approximately 1 to 2 months ago, and that he does have underlying emphysema.  Will start patient on azithromycin  250 mg for upper respiratory infection, fluticasone  50 mcg nasal spray for nasal congestion and runny nose.SABRA  Also start patient on prednisone  40 mg for the next 5 days for underlying emphysema.  Encourage patient to discontinue smoking while symptoms persist.  Supportive care recommendations were provided and discussed with the  patient to include over-the-counter Tylenol , fluids, rest, and use of a humidifier at night during sleep.  Discussed indications regarding follow-up.  Patient was in agreement with this plan of care and verbalizes understanding.  All questions were answered.  Patient stable for discharge.  Final Clinical Impressions(s) / UC Diagnoses    Final diagnoses:  Cough, unspecified type  Acute upper respiratory infection  History of emphysema (HCC)     Discharge Instructions      Take medication as prescribed. Increase fluids and allow for plenty of rest. May take over-the-counter Tylenol  as needed for pain, fever, or general discomfort. Recommend using a humidifier in your bedroom at nighttime during sleep and sleeping elevated on pillows while cough symptoms persist. May use normal saline nasal spray throughout the day for nasal congestion and runny nose. If you experience worsening shortness of breath, difficulty breathing, fatigue, or other concerns, please go to the emergency department immediately for further evaluation. Would like for you to speak with your PCP regarding smoking cessation.  I do encourage you to try to completely stop smoking while you are currently experiencing symptoms. Recommend following up with your PCP within the next 7 to 10 days for reevaluation. Follow-up as needed.     ED Prescriptions     Medication Sig Dispense Auth. Provider   azithromycin  (ZITHROMAX ) 250 MG tablet Take 1 tablet (250 mg total) by mouth daily. Take first 2 tablets together, then 1 every day until finished. 6 tablet Leath-Warren, Akemi Overholser J, NP   fluticasone  (FLONASE ) 50 MCG/ACT nasal spray Place 2 sprays into both nostrils daily. 16 g Leath-Warren, Etta PARAS, NP   predniSONE  (DELTASONE ) 20 MG tablet Take 2 tablets (40 mg total) by mouth daily with breakfast for 5 days. 10 tablet Leath-Warren, Etta PARAS, NP      PDMP not reviewed this encounter.   Gilmer Etta PARAS, NP 12/22/23 [phone]

## 2023-12-22 NOTE — ED Triage Notes (Signed)
 Cough, ear fullness, nasal congestion, SOB with exertion, fatigue x 7 days. Taking mucinex, Nyquil, tylenol and vit C.

## 2023-12-23 ENCOUNTER — Other Ambulatory Visit: Payer: Self-pay | Admitting: Internal Medicine

## 2023-12-23 DIAGNOSIS — Z1211 Encounter for screening for malignant neoplasm of colon: Secondary | ICD-10-CM | POA: Insufficient documentation

## 2023-12-24 ENCOUNTER — Other Ambulatory Visit: Payer: Self-pay

## 2023-12-24 DIAGNOSIS — E118 Type 2 diabetes mellitus with unspecified complications: Secondary | ICD-10-CM

## 2023-12-31 ENCOUNTER — Other Ambulatory Visit: Payer: Medicare Other

## 2023-12-31 LAB — HM DIABETES EYE EXAM

## 2024-01-01 ENCOUNTER — Encounter: Payer: Self-pay | Admitting: Endocrinology

## 2024-01-01 LAB — BASIC METABOLIC PANEL
BUN: 22 mg/dL (ref 7–25)
CO2: 27 mmol/L (ref 20–32)
Calcium: 8.8 mg/dL (ref 8.6–10.3)
Chloride: 106 mmol/L (ref 98–110)
Creat: 0.94 mg/dL (ref 0.70–1.28)
Glucose, Bld: 133 mg/dL — ABNORMAL HIGH (ref 65–99)
Potassium: 4.5 mmol/L (ref 3.5–5.3)
Sodium: 141 mmol/L (ref 135–146)

## 2024-01-01 LAB — HEMOGLOBIN A1C
Hgb A1c MFr Bld: 5.9 %{Hb} — ABNORMAL HIGH (ref <5.7)
Mean Plasma Glucose: 123 mg/dL
eAG (mmol/L): 6.8 mmol/L

## 2024-01-05 ENCOUNTER — Encounter: Payer: Self-pay | Admitting: Endocrinology

## 2024-01-07 ENCOUNTER — Ambulatory Visit (INDEPENDENT_AMBULATORY_CARE_PROVIDER_SITE_OTHER): Payer: Medicare Other | Admitting: Endocrinology

## 2024-01-07 ENCOUNTER — Encounter: Payer: Self-pay | Admitting: Endocrinology

## 2024-01-07 DIAGNOSIS — E118 Type 2 diabetes mellitus with unspecified complications: Secondary | ICD-10-CM | POA: Diagnosis not present

## 2024-01-07 DIAGNOSIS — Z794 Long term (current) use of insulin: Secondary | ICD-10-CM | POA: Diagnosis not present

## 2024-01-07 NOTE — Progress Notes (Addendum)
Outpatient Endocrinology Note Alan Marrian Bells, MD  01/07/24  Patient's Name: Alan Lopez    DOB: June 15, 1946    MRN: [id number]                                                    REASON OF VISIT: Follow up of type 2 diabetes mellitus  PCP: Etta Grandchild, MD  HISTORY OF PRESENT ILLNESS:   Alan Lopez is a 78 y.o. old male with past medical history listed below, is here for follow up for type 2 diabetes mellitus.   Pertinent Diabetes History: Patient was diagnosed with type 2 diabetes mellitus in 1999.  He was started on V-Go pump in July 2018 because of inconsistent control and poor compliance with mealtime insulin.  Chronic Diabetes Complications : Retinopathy: no. Last ophthalmology exam was done on annually, following with ophthalmology regularly. 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  Nephropathy: no, on ACE/ARB /losartan. Peripheral neuropathy: yes numbness and tingling of the sole. Coronary artery disease: yes Stroke: no  Relevant comorbidities and cardiovascular risk factors: Obesity: no There is no height or weight on file to calculate BMI.  Hypertension: Yes  Hyperlipidemia : Yes, on statin   Current / Home Diabetic regimen includes:  Actos 15 mg daily. Jardiance 25 mg daily. Current INSULIN regimen: V-go pump SETTINGS: Basal rate = 30 units BOLUSES = 6-8 units at breakfast, 6-8 units at lunch and  6-8 units at dinner   INSULIN type: Regular insulin  Prior diabetic medications: Glipizide, metformin in the past.  Diarrhea with metformin.  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  He has been checking 1-2 times a day.  Fasting blood sugar 146, 150, 142, 116, 115.  Blood sugar in the evening 174, 162.  He is mostly sick in the morning fasting.  CGM/Dexcom was discussed in the past.  Patient reports that he had adhesive problem in the abdomen.  Hypoglycemia: Patient  has no hypoglycemic episodes. Patient has hypoglycemia awareness.  Factors modifying glucose control: 1.  Diabetic diet assessment: 3 meals a day.  He tried to limit high carb meals.  2.  Staying active or exercising:   3.  Medication compliance: compliant all of the time.  THYROID: Previously was found to have a 2 cm nodule on his left thyroid on his exam but ultrasound in 04/2015 showed multinodular goiter with mostly small nodules; the largest nodule on the left side of 1.4 cm has the appearance of a pseudo- nodule   Thyroid levels have been normal consistently  Interval history  Diabetes regimen as reviewed above.  Denies any numbness or tingling the feet.  No vision change.  He forgot to bring the glucometer in the clinic today.  Hemoglobin A1c 6.2% recently.  No other complaints today.  REVIEW OF SYSTEMS As per history of present illness.   PAST MEDICAL HISTORY: Past Medical History:  Diagnosis Date   Blindness of left eye 04/12/2015   Bradycardia 01/07/2022   Beta-blocker DC'd due to bradycardia.   HTN (hypertension)    Hypertrophy (benign) of prostate    Multinodular goiter 04/12/2015   Non-insulin dependent type 2 diabetes mellitus (HCC)    Status post non-ST elevation myocardial infarction (NSTEMI)    CATH   Tobacco  abuse    Type II diabetes mellitus, uncontrolled 04/12/2015    PAST SURGICAL HISTORY: Past Surgical History:  Procedure Laterality Date   CORONARY ANGIOPLASTY WITH STENT PLACEMENT      ALLERGIES: Allergies  Allergen Reactions   Atorvastatin Other (See Comments)   Codeine Other (See Comments)    Patient doesn't like how it makes him feel, "spaced out, not together"    FAMILY HISTORY:  Family History  Problem Relation Age of Onset   Diabetes Mother    Diabetes Brother    Cancer Brother    Heart disease Neg Hx     SOCIAL HISTORY: Social History   Socioeconomic History   Marital status: Widowed    Spouse name: Not on file   Number of  children: 3   Years of education: Not on file   Highest education level: Not on file  Occupational History   Occupation: retired  Tobacco Use   Smoking status: Light Smoker    Current packs/day: 0.50    Average packs/day: 0.5 packs/day for 40.0 years (20.0 ttl pk-yrs)    Types: Cigarettes    Passive exposure: Past   Smokeless tobacco: Never   Tobacco comments:    smokes on social occassions  Vaping Use   Vaping status: Never Used  Substance and Sexual Activity   Alcohol use: Yes    Alcohol/week: 10.0 standard drinks of alcohol    Types: 10 Cans of beer per week    Comment: socially   Drug use: No   Sexual activity: Never  Other Topics Concern   Not on file  Social History Narrative   Not on file   Social Drivers of Health   Financial Resource Strain: Low Risk  (01/28/2018)   Overall Financial Resource Strain (CARDIA)    Difficulty of Paying Living Expenses: Not hard at all  Food Insecurity: No Food Insecurity (01/28/2018)   Hunger Vital Sign    Worried About Running Out of Food in the Last Year: Never true    Ran Out of Food in the Last Year: Never true  Transportation Needs: No Transportation Needs (01/28/2018)   PRAPARE - Administrator, Civil Service (Medical): No    Lack of Transportation (Non-Medical): No  Physical Activity: Sufficiently Active (11/14/2022)   Exercise Vital Sign    Days of Exercise per Week: 2 days    Minutes of Exercise per Session: 100 min  Stress: No Stress Concern Present (11/14/2022)   [employer] of Occupational Health - Occupational Stress Questionnaire    Feeling of Stress : Not at all  Social Connections: Unknown (11/14/2022)   Social Connection and Isolation Panel [NHANES]    Frequency of Communication with Friends and Family: Not on file    Frequency of Social Gatherings with Friends and Family: Not on file    Attends Religious Services: Not on file    Active Member of Clubs or Organizations: Yes    Attends Tax inspector Meetings: Not on file    Marital Status: Patient declined    MEDICATIONS:  Current Outpatient Medications  Medication Sig Dispense Refill   acetaminophen (TYLENOL) 500 MG tablet Take 500 mg by mouth every 6 (six) hours as needed for mild pain or moderate pain.     albuterol (VENTOLIN HFA) 108 (90 Base) MCG/ACT inhaler Inhale 2 puffs into the lungs every 6 (six) hours as needed for wheezing or shortness of breath. 18 g 0   alfuzosin (UROXATRAL) 10 MG 24 hr  tablet Take 1 tablet (10 mg total) by mouth daily with breakfast. 90 tablet 3   aspirin EC 81 MG tablet Take 81 mg by mouth daily.     azithromycin (ZITHROMAX) 250 MG tablet Take 1 tablet (250 mg total) by mouth daily. Take first 2 tablets together, then 1 every day until finished. 6 tablet 0   cetirizine (ZYRTEC) 10 MG tablet Take 10 mg by mouth daily.     cholecalciferol (VITAMIN D) 1000 UNITS tablet Take 1,000 Units by mouth daily.     dapsone 100 MG tablet Take 100 mg by mouth daily.     empagliflozin (JARDIANCE) 25 MG TABS tablet Take 1 tablet (25 mg total) by mouth daily. 30 tablet 3   finasteride (PROSCAR) 5 MG tablet Take 1 tablet (5 mg total) by mouth daily. 90 tablet 3   fluticasone (FLONASE) 50 MCG/ACT nasal spray Place 2 sprays into both nostrils daily. 16 g 0   Insulin Disposable Pump (V-GO 30) 30 UNIT/24HR KIT USE AS DIRECT TO INJECT UP TO 30 UNITS AT 6:00AM DAILY 30 kit 11   Insulin Pen Needle 32G X 4 MM MISC Use Three per day to inject insulin 100 each 1   insulin regular (NOVOLIN R,HUMULIN R) 100 units/mL injection Inject into the skin 3 (three) times daily before meals. USE INSULIN TO FILL UP v-GO PUMP, use 3-5 units     losartan (COZAAR) 25 MG tablet Take 1 tablet (25 mg total) by mouth daily. 90 tablet 3   Multiple Vitamin (MULTIVITAMIN WITH MINERALS) TABS tablet Take 1 tablet by mouth daily.     pioglitazone (ACTOS) 15 MG tablet TAKE ONE TABLET BY MOUTH DAILY 90 tablet 0   rosuvastatin (CRESTOR) 10 MG tablet  Take 1 tablet (10 mg total) by mouth at bedtime. 90 tablet 3   Tiotropium Bromide Monohydrate (SPIRIVA RESPIMAT) 2.5 MCG/ACT AERS Inhale 2 puffs into the lungs daily. 12 g 0   No current facility-administered medications for this visit.    PHYSICAL EXAM: There were no vitals filed for this visit.  There is no height or weight on file to calculate BMI.  Wt Readings from Last 3 Encounters:  10/28/23 210 lb 6.4 oz (95.4 kg)  10/07/23 207 lb 12.8 oz (94.3 kg)  09/22/23 209 lb (94.8 kg)    General: Well developed, well nourished male in no apparent distress.  HEENT: AT/[place], no external lesions.  Eyes: Conjunctiva clear and no icterus. Neck: Neck supple  Lungs: Respirations not labored Neurologic: Alert, oriented, normal speech Extremities / Skin: Dry.   Psychiatric: Does not appear depressed or anxious  Diabetic Foot Exam - Simple   Simple Foot Form Diabetic Foot exam was performed with the following findings: Yes 01/07/2024 10:23 AM  Visual Inspection No deformities, no ulcerations, no other skin breakdown bilaterally: Yes See comments: Yes Sensation Testing Intact to touch and monofilament testing bilaterally: Yes Pulse Check Posterior Tibialis and Dorsalis pulse intact bilaterally: Yes Comments Mild callus +    LABS Reviewed Lab Results  Component Value Date   HGBA1C 5.9 (H) 12/31/2023   HGBA1C 6.2 10/01/2023   HGBA1C 7.0 (H) 06/02/2023   Lab Results  Component Value Date   FRUCTOSAMINE 225 05/01/2020   FRUCTOSAMINE 294 (H) 06/30/2017   FRUCTOSAMINE 255 09/09/2016   Lab Results  Component Value Date   CHOL 128 10/01/2023   HDL 67.20 10/01/2023   LDLCALC 36 10/01/2023   TRIG 120.0 10/01/2023   CHOLHDL 2 10/01/2023   Lab Results  Component Value Date   MICRALBCREAT 1.7 10/01/2023   MICRALBCREAT 1.3 10/01/2022   Lab Results  Component Value Date   CREATININE 0.94 12/31/2023   Lab Results  Component Value Date   GFR 83.45 10/01/2023    ASSESSMENT /  PLAN  1. Controlled type 2 diabetes mellitus with complication, with long-term current use of insulin (HCC)     Diabetes Mellitus type 2, complicated by CAD/peripheral neuropathy. - Diabetic status / severity: Controlled  Lab Results  Component Value Date   HGBA1C 5.9 (H) 12/31/2023    - Hemoglobin A1c goal : <7%  Hemoglobin A1c today 5.9%.  Discussed about concern of possible hypoglycemia.  He reports he may have hypoglycemia symptoms however he corrected with eating.  Not recorded on glucometer.  Asked to monitor blood sugar more often especially when he has hypoglycemic symptoms.  Discussed about continuous glucose monitoring, patient wants to think about it he declined at this time.  Consider other option is we may have to decrease the dose of V-Go pump basal from 30 to 20.  Patient is asked to call our clinic if he develops any hypoglycemia.  - Medications: See below.  No change.  Actos 15 mg daily. Jardiance 25 mg daily. Current INSULIN regimen: V-go pump SETTINGS: Basal rate = 30 units BOLUSES = 6-8 units at breakfast, 6-8 units at lunch and  6-8 units at dinner   INSULIN type: Regular insulin  Discussed about using continuous glucose monitoring.  Patient is not ready at this time.  He will think about it.  He has concern about not able to use in the abdomen and has been using V-Go pump in the arms.  - Home glucose testing: Before meals and at bedtime.  Asked to bring glucometer in the follow-up visit.  Encouraged to use CGM. - Discussed/ Gave Hypoglycemia treatment plan.  # Consult : not required at this time.   # Annual urine for microalbuminuria/ creatinine ratio, no microalbuminuria currently, continue ACE/ARB /losartan. Last  Lab Results  Component Value Date   MICRALBCREAT 1.7 10/01/2023    # Foot check nightly / neuropathy.  # Annual dilated diabetic eye exams.   - Diet: Make healthy diabetic food choices - Life style / activity / exercise:  Discussed.  2. Blood pressure  -  BP Readings from Last 1 Encounters:  12/22/23 120/74    - Control is in target.  Mildly high.  - No change in current plans.  3. Lipid status / Hyperlipidemia - Last  Lab Results  Component Value Date   LDLCALC 36 10/01/2023   - Continue rosuvastatin 20 mg daily.    Diagnoses and all orders for this visit:  Controlled type 2 diabetes mellitus with complication, with long-term current use of insulin (HCC) -     BASIC METABOLIC PANEL WITH GFR -     Hemoglobin A1c   DISPOSITION Follow up in clinic in 3 months suggested.   All questions answered and patient verbalized understanding of the plan.  Alan Loanne Emery, MD [facility] Endocrinology [facility] [address] [place], [address] [place], [place] [address] Phone # [phone]  At least part of this note was generated using voice recognition software. Inadvertent word errors may have occurred, which were not recognized during the proofreading process.

## 2024-01-13 LAB — COLOGUARD: COLOGUARD: NEGATIVE

## 2024-02-13 ENCOUNTER — Other Ambulatory Visit: Payer: Self-pay | Admitting: Endocrinology

## 2024-02-13 DIAGNOSIS — Z794 Long term (current) use of insulin: Secondary | ICD-10-CM

## 2024-02-16 ENCOUNTER — Other Ambulatory Visit: Payer: Self-pay

## 2024-03-09 ENCOUNTER — Ambulatory Visit: Payer: Self-pay

## 2024-03-09 NOTE — Telephone Encounter (Signed)
 Copied from CRM [id number]. Topic: Clinical - Red Word Triage >> Mar 09, 2024  1:13 PM Morrie Sheldon D wrote: Reason for CRM:.patient called stating he has swelling and pain in his left leg and it is getting more diffult to walk   Chief Complaint: Left lower leg swelling Symptoms: swelling, pain Frequency: about a week ago Pertinent Negatives: Patient denies chest pain, difficulty breathing, fever, injuries Disposition: [] ED /[x] Urgent Care (no appt availability in office) / [] Appointment(In office/virtual)/ []  [facility] [facility] [place] [facility]/ [] Home Care/ [] Refused Recommended Disposition /[] [facility]/ []  Follow-up with PCP Additional Notes: Patient called and advised that he just got back from a trip to [place] this past Thursday--8-11 hour ride in car. Patient states that it hurts about a 3 or 4 out of 10 on the pain level.  He has pain if he walks for a long time.  He states that he feels a "knot" in the back of his left calf just above his ankle.  Patient denies any injuries, chest pain, difficulty breathing, fever. Patient is advised that having swelling of his left lower leg and it being only this side with a  recent history of a long car ride it is recommended that he be seen in the next 4 hours by a provider.  There are no available appointments in the patient's PCP office in the next few hours.  Patient states that he would like to possibly become an established patient at the [facility] location so this RN looked for an available appointment there.  None were available.  Patient asked if a blood clot was a possibility.  This RN advised him that it would be something to rule out and that the current recommendation is to be seen by a provider in the next 4 hours to have it assessed.  Patient was familiar with some urgent cares near him that he had been to before.  Patient states that he will go to a nearby Urgent Care to have this left lower leg checked out today. He is advised to call us back  with any questions and also is advised to have a provider evaluate this leg today, whether it be Urgent Care or an Emergency Room.  Patient verbalized understanding.     Reason for Disposition  [1] Thigh, calf, or ankle swelling AND [2] only 1 side  Answer Assessment - Initial Assessment Questions 1. ONSET: "When did the swelling start?" (e.g., minutes, hours, days)     About a week ago 2. LOCATION: "What part of the leg is swollen?"  "Are both legs swollen or just one leg?"     Back of the left calf area above ankle 3. SEVERITY: "How bad is the swelling?" (e.g., localized; mild, moderate, severe)   - Localized: Small area of swelling localized to one leg.   - MILD pedal edema: Swelling limited to foot and ankle, pitting edema < 1/4 inch (6 mm) deep, rest and elevation eliminate most or all swelling.   - MODERATE edema: Swelling of lower leg to knee, pitting edema > 1/4 inch (6 mm) deep, rest and elevation only partially reduce swelling.   - SEVERE edema: Swelling extends above knee, facial or hand swelling present.      Swelling from the left knee down to foot with a knot in the back of the calf--swelling especially in ankle and foot 4. REDNESS: "Does the swelling look red or infected?"     No 5. PAIN: "Is the swelling painful to  touch?" If Yes, ask: "How painful is it?"   (Scale 1-10; mild, moderate or severe)     "Not excruciating but uncomfortable to walk very long" 6. FEVER: "Do you have a fever?" If Yes, ask: "What is it, how was it measured, and when did it start?"      No 7. CAUSE: "What do you think is causing the leg swelling?"     unsure 8. MEDICAL HISTORY: "Do you have a history of blood clots (e.g., DVT), cancer, heart failure, kidney disease, or liver failure?"     No 9. RECURRENT SYMPTOM: "Have you had leg swelling before?" If Yes, ask: "When was the last time?" "What happened that time?"     No 10. OTHER SYMPTOMS: "Do you have any other symptoms?" (e.g., chest pain,  difficulty breathing)    No  Protocols used: Leg Swelling and Edema-A-AH

## 2024-03-10 ENCOUNTER — Ambulatory Visit (HOSPITAL_COMMUNITY)
Admission: RE | Admit: 2024-03-10 | Discharge: 2024-03-10 | Disposition: A | Discharge status: Home / Self Care | Source: Ambulatory Visit

## 2024-03-10 ENCOUNTER — Encounter (HOSPITAL_COMMUNITY): Payer: Self-pay

## 2024-03-10 ENCOUNTER — Telehealth (HOSPITAL_COMMUNITY): Payer: Self-pay

## 2024-03-10 ENCOUNTER — Ambulatory Visit (HOSPITAL_COMMUNITY)
Admission: RE | Admit: 2024-03-10 | Discharge: 2024-03-10 | Disposition: A | Discharge status: Home / Self Care | Source: Ambulatory Visit | Attending: Family Medicine | Admitting: Family Medicine

## 2024-03-10 VITALS — BP 167/85 | HR 75 | Temp 97.6°F | Resp 18

## 2024-03-10 DIAGNOSIS — M7989 Other specified soft tissue disorders: Secondary | ICD-10-CM | POA: Diagnosis not present

## 2024-03-10 DIAGNOSIS — S86912A Strain of unspecified muscle(s) and tendon(s) at lower leg level, left leg, initial encounter: Secondary | ICD-10-CM

## 2024-03-10 MED ORDER — IBUPROFEN 600 MG PO TABS: 600.0000 mg | ORAL_TABLET | Freq: Four times a day (QID) | ORAL | 0 refills | Status: AC | PRN

## 2024-03-10 NOTE — Telephone Encounter (Signed)
 DVT study negative.  Prescription for ibuprofen 600 mg sent to pharmacy for treatment of left lower leg muscle strain.

## 2024-03-10 NOTE — ED Triage Notes (Signed)
 Pt c/o lt leg swelling and a knot behind lt calf x1wk. States had a 10hr car ride 3wks ago. States sent here from PCP to r/o blood clot.

## 2024-03-10 NOTE — Progress Notes (Signed)
 Lower extremity venous duplex completed. Please see CV Procedures for preliminary results.  Shona Simpson, RVT 03/10/24 2:54 PM

## 2024-03-10 NOTE — Discharge Instructions (Addendum)
 1. Left leg swelling (Primary) - VAS [place] LOWER EXTREMITY VENOUS (DVT) (ONLY [facility] & [facility]) ordered for evaluation of left lower leg swelling. -Ultrasound will be completed by vascular ultrasound/DVT department at [facility] -Ultrasound appointment is scheduled for today at 230.  Please arrive 15 minutes early for procedure. -If DVT ultrasound is negative will prescribe ibuprofen 600 mg for left lower leg pain and swelling with ambulation.

## 2024-03-10 NOTE — ED Provider Notes (Signed)
 [facility]  Note:  This document was prepared using Dragon voice recognition software and may include unintentional dictation errors.  MRN: [id number] DOB: September 02, 1946  Subjective:   Alan Lopez is a 78 y.o. male presenting for left lower extremity swelling and pain to his calf muscle x 1 week.  Patient reports that he went on a trip approximately 3 weeks ago to [place].  Patient reports driving home for 11 to 12 hours.  Patient reports that he was very physically active while on vacation walking a lot does not remember any specific injury or incident.  Patient has been taking Tylenol with minimal improvement to symptoms.  No past history of DVT or calf injury.  Patient reports that swelling is slightly worse in the left leg but has mild swelling to the right.  No shortness of breath, chest pain, weakness, dizziness, palpitations.  No current facility-administered medications for this encounter.  Current Outpatient Medications:    acetaminophen (TYLENOL) 500 MG tablet, Take 500 mg by mouth every 6 (six) hours as needed for mild pain or moderate pain., Disp: , Rfl:    albuterol (VENTOLIN HFA) 108 (90 Base) MCG/ACT inhaler, Inhale 2 puffs into the lungs every 6 (six) hours as needed for wheezing or shortness of breath., Disp: 18 g, Rfl: 0   alfuzosin (UROXATRAL) 10 MG 24 hr tablet, Take 1 tablet (10 mg total) by mouth daily with breakfast., Disp: 90 tablet, Rfl: 3   aspirin EC 81 MG tablet, Take 81 mg by mouth daily., Disp: , Rfl:    cetirizine (ZYRTEC) 10 MG tablet, Take 10 mg by mouth daily., Disp: , Rfl:    cholecalciferol (VITAMIN D) 1000 UNITS tablet, Take 1,000 Units by mouth daily., Disp: , Rfl:    dapsone 100 MG tablet, Take 100 mg by mouth daily., Disp: , Rfl:    finasteride (PROSCAR) 5 MG tablet, Take 1 tablet (5 mg total) by mouth daily., Disp: 90 tablet, Rfl: 3   fluticasone (FLONASE) 50 MCG/ACT nasal spray, Place 2 sprays into both nostrils daily., Disp: 16 g,  Rfl: 0   Insulin Disposable Pump (V-GO 30) 30 UNIT/24HR KIT, USE AS DIRECT TO INJECT UP TO 30 UNITS AT 6:00AM DAILY, Disp: 30 kit, Rfl: 11   Insulin Pen Needle 32G X 4 MM MISC, Use Three per day to inject insulin, Disp: 100 each, Rfl: 1   insulin regular (NOVOLIN R,HUMULIN R) 100 units/mL injection, Inject into the skin 3 (three) times daily before meals. USE INSULIN TO FILL UP v-GO PUMP, use 3-5 units, Disp: , Rfl:    JARDIANCE 25 MG TABS tablet, TAKE 1 TABLET BY MOUTH DAILY, Disp: 30 tablet, Rfl: 3   losartan (COZAAR) 25 MG tablet, Take 1 tablet (25 mg total) by mouth daily., Disp: 90 tablet, Rfl: 3   Multiple Vitamin (MULTIVITAMIN WITH MINERALS) TABS tablet, Take 1 tablet by mouth daily., Disp: , Rfl:    pioglitazone (ACTOS) 15 MG tablet, TAKE ONE TABLET BY MOUTH DAILY, Disp: 90 tablet, Rfl: 0   rosuvastatin (CRESTOR) 10 MG tablet, Take 1 tablet (10 mg total) by mouth at bedtime., Disp: 90 tablet, Rfl: 3   Tiotropium Bromide Monohydrate (SPIRIVA RESPIMAT) 2.5 MCG/ACT AERS, Inhale 2 puffs into the lungs daily., Disp: 12 g, Rfl: 0   Allergies  Allergen Reactions   Atorvastatin Other (See Comments)   Codeine Other (See Comments)    Patient doesn't like how it makes him feel, "spaced out, not together"    Past Medical  History:  Diagnosis Date   Blindness of left eye 04/12/2015   Bradycardia 01/07/2022   Beta-blocker DC'd due to bradycardia.   HTN (hypertension)    Hypertrophy (benign) of prostate    Multinodular goiter 04/12/2015   Non-insulin dependent type 2 diabetes mellitus (HCC)    Status post non-ST elevation myocardial infarction (NSTEMI)    CATH   Tobacco abuse    Type II diabetes mellitus, uncontrolled 04/12/2015     Past Surgical History:  Procedure Laterality Date   CORONARY ANGIOPLASTY WITH STENT PLACEMENT      Family History  Problem Relation Age of Onset   Diabetes Mother    Diabetes Brother    Cancer Brother    Heart disease Neg Hx     Social History   Tobacco  Use   Smoking status: Light Smoker    Current packs/day: 0.50    Average packs/day: 0.5 packs/day for 40.0 years (20.0 ttl pk-yrs)    Types: Cigarettes    Passive exposure: Past   Smokeless tobacco: Never   Tobacco comments:    smokes on social occassions  Vaping Use   Vaping status: Never Used  Substance Use Topics   Alcohol use: Yes    Alcohol/week: 10.0 standard drinks of alcohol    Types: 10 Cans of beer per week    Comment: socially   Drug use: No    ROS Refer to HPI for ROS details.  Objective:   Vitals: BP (!) 167/85 (BP Location: Left Arm)   Pulse 75   Temp 97.6 F (36.4 C) (Oral)   Resp 18   SpO2 (!) 89% Comment: pt states his normal O2 is 88-90%ra.  Physical Exam Vitals and nursing note reviewed.  Constitutional:      General: He is not in acute distress.    Appearance: Normal appearance. He is well-developed. He is not ill-appearing or toxic-appearing.  HENT:     Head: Normocephalic.  Cardiovascular:     Rate and Rhythm: Normal rate.  Pulmonary:     Effort: Pulmonary effort is normal. No respiratory distress.  Musculoskeletal:     Right lower leg: Swelling present. No tenderness or bony tenderness. No edema.     Left lower leg: Swelling and tenderness present. No bony tenderness. No edema.  Skin:    General: Skin is warm and dry.  Neurological:     General: No focal deficit present.     Mental Status: He is alert and oriented to person, place, and time.  Psychiatric:        Mood and Affect: Mood normal.        Behavior: Behavior normal.     Procedures  No results found for this or any previous visit (from the past 24 hours).  Assessment and Plan :   PDMP not reviewed this encounter.  1. Left leg swelling    1. Left leg swelling (Primary) - VAS [place] LOWER EXTREMITY VENOUS (DVT) (ONLY [facility]) ordered for evaluation of left lower leg swelling. -Ultrasound will be completed by vascular ultrasound/DVT department at [facility] -Ultrasound appointment is scheduled for today at 230.  Please arrive 15 minutes early for procedure. -If DVT ultrasound is negative will prescribe ibuprofen 600 mg for left lower leg pain and swelling with ambulation.   -Continue to monitor symptoms for any change in severity if there is any escalation of current symptoms or development of new symptoms follow-up in ER for further evaluation and management.  Lucky Cowboy   [place], [place], [place] 03/10/24 1023

## 2024-03-16 ENCOUNTER — Other Ambulatory Visit: Payer: Self-pay

## 2024-03-29 ENCOUNTER — Ambulatory Visit (INDEPENDENT_AMBULATORY_CARE_PROVIDER_SITE_OTHER)

## 2024-03-29 VITALS — Ht 72.0 in | Wt 210.0 lb

## 2024-03-29 DIAGNOSIS — Z122 Encounter for screening for malignant neoplasm of respiratory organs: Secondary | ICD-10-CM

## 2024-03-29 DIAGNOSIS — Z0000 Encounter for general adult medical examination without abnormal findings: Secondary | ICD-10-CM | POA: Diagnosis not present

## 2024-03-29 DIAGNOSIS — F17200 Nicotine dependence, unspecified, uncomplicated: Secondary | ICD-10-CM | POA: Diagnosis not present

## 2024-03-29 NOTE — Progress Notes (Signed)
 Subjective:   Alan Lopez is a 78 y.o. who presents for a Medicare Wellness preventive visit.  Visit Complete: Virtual I connected with  Correne Dillon on 03/29/24 by a audio enabled telemedicine application and verified that I am speaking with the correct person using two identifiers.  Patient Location: Home  Provider Location: Office/Clinic  I discussed the limitations of evaluation and management by telemedicine. The patient expressed understanding and agreed to proceed.  Vital Signs: Because this visit was a virtual/telehealth visit, some criteria may be missing or patient reported. Any vitals not documented were not able to be obtained and vitals that have been documented are patient reported.  VideoDeclined- This patient declined Librarian, academic. Therefore the visit was completed with audio only.  Persons Participating in Visit: Patient.  AWV Questionnaire: No: Patient Medicare AWV questionnaire was not completed prior to this visit.  Cardiac Risk Factors include: advanced age (>69men, >88 women);diabetes mellitus;dyslipidemia;hypertension;male gender     Objective:    Today's Vitals   03/29/24 1505  Weight: 210 lb (95.3 kg)  Height: 6' (1.829 m)   Body mass index is 28.48 kg/m.     03/29/2024    3:04 PM 07/15/2022    2:35 PM 02/22/2019   10:17 AM 02/03/2019    1:25 PM 01/28/2018    1:44 PM 05/16/2015    2:12 PM  Advanced Directives  Does Patient Have a Medical Advance Directive? Yes No Yes Yes Yes Yes  Type of Estate agent of [place];Living will  Living will;Healthcare Power of [employer] Power of [place];Living will Healthcare Power of [place];Living will   Copy of Healthcare Power of Attorney in Chart? No - copy requested  Yes - validated most recent copy scanned in chart (See row information) No - copy requested No - copy requested   Would patient like information on creating a medical advance  directive?  No - Patient declined        Current Medications (verified) Outpatient Encounter Medications as of 03/29/2024  Medication Sig   acetaminophen  (TYLENOL ) 500 MG tablet Take 500 mg by mouth every 6 (six) hours as needed for mild pain or moderate pain.   alfuzosin  (UROXATRAL ) 10 MG 24 hr tablet Take 1 tablet (10 mg total) by mouth daily with breakfast.   aspirin EC 81 MG tablet Take 81 mg by mouth daily.   cetirizine (ZYRTEC) 10 MG tablet Take 10 mg by mouth daily.   cholecalciferol (VITAMIN D) 1000 UNITS tablet Take 1,000 Units by mouth daily.   dapsone 100 MG tablet Take 100 mg by mouth daily.   finasteride  (PROSCAR ) 5 MG tablet Take 1 tablet (5 mg total) by mouth daily.   fluticasone  (FLONASE ) 50 MCG/ACT nasal spray Place 2 sprays into both nostrils daily.   ibuprofen  (ADVIL ) 600 MG tablet Take 1 tablet (600 mg total) by mouth every 6 (six) hours as needed.   Insulin  Disposable Pump (V-GO 30) 30 UNIT/24HR KIT USE AS DIRECT TO INJECT UP TO 30 UNITS AT 6:00AM DAILY   Insulin  Pen Needle 32G X 4 MM MISC Use Three per day to inject insulin    insulin  regular (NOVOLIN R,HUMULIN R ) 100 units/mL injection Inject into the skin 3 (three) times daily before meals. USE INSULIN  TO FILL UP v-GO PUMP, use 3-5 units   JARDIANCE  25 MG TABS tablet TAKE 1 TABLET BY MOUTH DAILY   losartan  (COZAAR ) 25 MG tablet Take 1 tablet (25 mg total) by mouth daily.   Multiple  Vitamin (MULTIVITAMIN WITH MINERALS) TABS tablet Take 1 tablet by mouth daily.   pioglitazone  (ACTOS ) 15 MG tablet TAKE ONE TABLET BY MOUTH DAILY   rosuvastatin  (CRESTOR ) 10 MG tablet Take 1 tablet (10 mg total) by mouth at bedtime.   [DISCONTINUED] albuterol  (VENTOLIN  HFA) 108 (90 Base) MCG/ACT inhaler Inhale 2 puffs into the lungs every 6 (six) hours as needed for wheezing or shortness of breath.   [DISCONTINUED] Tiotropium Bromide Monohydrate  (SPIRIVA  RESPIMAT) 2.5 MCG/ACT AERS Inhale 2 puffs into the lungs daily.   No  facility-administered encounter medications on file as of 03/29/2024.    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   HTN (hypertension)    Hypertrophy (benign) of prostate    Multinodular goiter 04/12/2015   Non-insulin  dependent type 2 diabetes mellitus (HCC)    Status post non-ST elevation myocardial infarction (NSTEMI)    CATH   Tobacco abuse    Type II diabetes mellitus, uncontrolled 04/12/2015   Past Surgical History:  Procedure Laterality Date   CORONARY ANGIOPLASTY WITH STENT PLACEMENT     Family History  Problem Relation Age of Onset   Diabetes Mother    Diabetes Brother    Cancer Brother    Heart disease Neg Hx    Social History   Socioeconomic History   Marital status: Widowed    Spouse name: Not on file   Number of children: 3   Years of education: Not on file   Highest education level: Not on file  Occupational History   Occupation: retired  Tobacco Use   Smoking status: Light Smoker    Current packs/day: 0.50    Average packs/day: 0.5 packs/day for 40.0 years (20.0 ttl pk-yrs)    Types: Cigarettes    Passive exposure: Past   Smokeless tobacco: Never   Tobacco comments:    smokes on social occassions  Vaping Use   Vaping status: Never Used  Substance and Sexual Activity   Alcohol use: Yes    Alcohol/week: 10.0 standard drinks of alcohol    Types: 10 Cans of beer per week    Comment: socially   Drug use: No   Sexual activity: Never  Other Topics Concern   Not on file  Social History Narrative   Not on file   Social Drivers of Health   Financial Resource Strain: Low Risk  (03/29/2024)   Overall Financial Resource Strain (CARDIA)    Difficulty of Paying Living Expenses: Not hard at all  Food Insecurity: No Food Insecurity (03/29/2024)   Hunger Vital Sign    Worried About Running Out of Food in  the Last Year: Never true    Ran Out of Food in the Last Year: Never true  Transportation Needs: No Transportation Needs (03/29/2024)   PRAPARE - Administrator, Civil Service (Medical): No    Lack of Transportation (Non-Medical): No  Physical Activity: Insufficiently Active (03/29/2024)   Exercise Vital Sign    Days of Exercise per Week: 3 days    Minutes of Exercise per Session: 30 min  Stress: No Stress Concern Present (03/29/2024)   [employer] of Occupational Health - Occupational Stress Questionnaire    Feeling of Stress : Not at all  Social Connections: Socially Isolated (03/29/2024)   Social Connection and Isolation Panel [NHANES]    Frequency of Communication with Friends and  Family: More than three times a week    Frequency of Social Gatherings with Friends and Family: Once a week    Attends Religious Services: Never    Database administrator or Organizations: No    Attends Banker Meetings: Never    Marital Status: Widowed    Tobacco Counseling Ready to quit: No Counseling given: Not Answered Tobacco comments: smokes on social occassions    Clinical Intake:  Pre-visit preparation completed: Yes  Pain : No/denies pain     Nutritional Risks: None Diabetes: No  Lab Results  Component Value Date   HGBA1C 5.9 (H) 12/31/2023   HGBA1C 6.2 10/01/2023   HGBA1C 7.0 (H) 06/02/2023     How often do you need to have someone help you when you read instructions, pamphlets, or other written materials from your doctor or pharmacy?: 1 - Never  Interpreter Needed?: No  Information entered by :: Kandy Orris, CMA   Activities of Daily Living     03/29/2024    3:11 PM  In your present state of health, do you have any difficulty performing the following activities:  Hearing? 0  Vision? 0  Difficulty concentrating or making decisions? 0  Walking or climbing stairs? 0  Dressing or bathing? 0  Doing errands, shopping? 0  Preparing Food and  eating ? N  Using the Toilet? N  In the past six months, have you accidently leaked urine? N  Do you have problems with loss of bowel control? N  Managing your Medications? N  Managing your Finances? N  Housekeeping or managing your Housekeeping? N    Patient Care Team: Arcadio Knuckles, MD as PCP - General (Internal Medicine) Hugh Madura, MD as PCP - Cardiology (Cardiology) Pauline Bos, OD as Referring Physician (Optometry) Domingo Friend, Fabiola Holy, MD (Inactive) as Consulting Physician (Urology) Mosetta Areola, RN as Registered Nurse Jonathan Neighbor, [facility] (Inactive) as Pharmacist (Pharmacist)  Indicate any recent Medical Services you may have received from other than [facility] providers in the past year (date may be approximate).     Assessment:   This is a routine wellness examination for [place].  Hearing/Vision screen Hearing Screening - Comments:: Denies hearing difficulties   Vision Screening - Comments:: Wears rx glasses - up to date with routine eye exams with Dr Joanne Muckle   Goals Addressed               This Visit's Progress     Patient Stated (pt-stated)        Patient stated he'd like to continue stay active.       Depression Screen     03/29/2024    3:10 PM 09/22/2023    8:46 AM 11/14/2022    3:10 PM 06/05/2020   10:28 AM 02/03/2019    1:26 PM 01/28/2018    1:53 PM 12/30/2017    9:44 AM  PHQ 2/9 Scores  PHQ - 2 Score 0 0 0 0 1 2 0  PHQ- 9 Score 1     3     Fall Risk     03/29/2024    3:07 PM 09/22/2023    8:46 AM 06/05/2020   10:28 AM 02/03/2019    1:26 PM 01/28/2018    1:53 PM  Fall Risk   Falls in the past year? 0 0 0 1 Yes  Number falls in past yr: 0 0 0 0 2 or more  Injury with Fall? 0 0 0 0 No  Risk for fall due to : No Fall Risks No Fall Risks No Fall Risks  Impaired balance/gait  Follow up Falls prevention discussed;Falls evaluation completed Falls evaluation completed Falls evaluation completed  Falls prevention discussed    MEDICARE RISK  AT HOME:  Medicare Risk at Home Any stairs in or around the home?: Yes If so, are there any without handrails?: No Home free of loose throw rugs in walkways, pet beds, electrical cords, etc?: Yes Adequate lighting in your home to reduce risk of falls?: Yes Life alert?: No Use of a cane, walker or w/c?: No Grab bars in the bathroom?: Yes Shower chair or bench in shower?: Yes Elevated toilet seat or a handicapped toilet?: No  TIMED UP AND GO:  Was the test performed?  No  Cognitive Function: Declined: Patient declined cognitive screening, but was able to answer questions in an accurate and timely manner. No cognitive impairments observed.    03/29/2024    3:16 PM 01/28/2018    2:13 PM  MMSE - Mini Mental State Exam  Not completed: Refused   Orientation to time  5  Orientation to Place  5  Registration  3  Attention/ Calculation  5  Recall  2  Language- name 2 objects  2  Language- repeat  1  Language- follow 3 step command  3  Language- read & follow direction  1  Write a sentence  1  Copy design  1  Total score  29        Immunizations Immunization History  Administered Date(s) Administered   Fluad Quad(high Dose 65+) 10/05/2019, 11/21/2021   Influenza, High Dose Seasonal PF 09/12/2016, 11/08/2017   Influenza,inj,Quad PF,6+ Mos 09/22/2018   Influenza-Unspecified 09/12/2014, 08/30/2015, 09/21/2020   Moderna SARS-COV2 Booster Vaccination 10/12/2020   Moderna Sars-Covid-2 Vaccination 12/30/2019, 02/04/2020   Pneumococcal Conjugate-13 09/13/2013   Pneumococcal Polysaccharide-23 01/01/2018   Tdap 07/21/2013   Zoster, Live 08/30/2015    Screening Tests Health Maintenance  Topic Date Due   Zoster Vaccines- Shingrix (1 of 2) 02/07/1964   Lung Cancer Screening  Never done   COVID-19 Vaccine (3 - Moderna risk series) 11/09/2020   DTaP/Tdap/Td (2 - Td or Tdap) 07/22/2023   HEMOGLOBIN A1C  06/29/2024   INFLUENZA VACCINE  07/09/2024   Diabetic kidney evaluation - Urine  ACR  09/30/2024   Diabetic kidney evaluation - eGFR measurement  12/30/2024   OPHTHALMOLOGY EXAM  12/30/2024   FOOT EXAM  01/06/2025   Medicare Annual Wellness (AWV)  03/29/2025   Fecal DNA (Cologuard)  01/04/2027   Pneumonia Vaccine 68+ Years old  Completed   Hepatitis C Screening  Completed   HPV VACCINES  Aged Out   Meningococcal B Vaccine  Aged Out    Health Maintenance  Health Maintenance Due  Topic Date Due   Zoster Vaccines- Shingrix (1 of 2) 02/07/1964   Lung Cancer Screening  Never done   COVID-19 Vaccine (3 - Moderna risk series) 11/09/2020   DTaP/Tdap/Td (2 - Td or Tdap) 07/22/2023   Health Maintenance Items Addressed:  Lung Cancer Screening ordered  Additional Screening:  Vision Screening: Recommended annual ophthalmology exams for early detection of glaucoma and other disorders of the eye.  Dental Screening: Recommended annual dental exams for proper oral hygiene  Community Resource Referral / Chronic Care Management: CRR required this visit?  No   CCM required this visit?  No     Plan:     I have personally reviewed and noted the following in  the patient's chart:   Medical and social history Use of alcohol, tobacco or illicit drugs  Current medications and supplements including opioid prescriptions. Patient is not currently taking opioid prescriptions. Functional ability and status Nutritional status Physical activity Advanced directives List of other physicians Hospitalizations, surgeries, and ER visits in previous 12 months Vitals Screenings to include cognitive, depression, and falls Referrals and appointments  In addition, I have reviewed and discussed with patient certain preventive protocols, quality metrics, and best practice recommendations. A written personalized care plan for preventive services as well as general preventive health recommendations were provided to patient.     Patria Bookbinder, CMA   03/29/2024   After Visit Summary:  (MyChart) Due to this being a telephonic visit, the after visit summary with patients personalized plan was offered to patient via MyChart   Notes: Patient declined 2026 AWV appt.

## 2024-03-29 NOTE — Patient Instructions (Addendum)
 Alan Lopez , Thank you for taking time to come for your Medicare Wellness Visit. I appreciate your ongoing commitment to your health goals. Please review the following plan we discussed and let me know if I can assist you in the future.   Referrals/Orders/Follow-Ups/Clinician Recommendations: Aim for 30 minutes of exercise or brisk walking, 6-8 glasses of water, and 5 servings of fruits and vegetables each day. Referral for a Lung Cancer Screening Test w/[facility] (Dr Baldwin Levee).    This is a list of the screening recommended for you and due dates:  Health Maintenance  Topic Date Due   Zoster (Shingles) Vaccine (1 of 2) 02/07/1964   Screening for Lung Cancer  Never done   COVID-19 Vaccine (3 - Moderna risk series) 11/09/2020   DTaP/Tdap/Td vaccine (2 - Td or Tdap) 07/22/2023   Hemoglobin A1C  06/29/2024   Flu Shot  07/09/2024   Yearly kidney health urinalysis for diabetes  09/30/2024   Yearly kidney function blood test for diabetes  12/30/2024   Eye exam for diabetics  12/30/2024   Complete foot exam   01/06/2025   Medicare Annual Wellness Visit  03/29/2025   Cologuard (Stool DNA test)  01/04/2027   Pneumonia Vaccine  Completed   Hepatitis C Screening  Completed   HPV Vaccine  Aged Out   Meningitis B Vaccine  Aged Out    Advanced directives: (Copy Requested) Please bring a copy of your health care power of attorney and living will to the office to be added to your chart at your convenience. You can mail to [facility] [address] [place], [place] [address] or email to [email or web address]  Next Medicare Annual Wellness Visit scheduled for next year: No - Patient declined 2026 AWV appt.

## 2024-04-05 ENCOUNTER — Other Ambulatory Visit

## 2024-04-05 ENCOUNTER — Other Ambulatory Visit: Payer: Self-pay

## 2024-04-05 DIAGNOSIS — E1165 Type 2 diabetes mellitus with hyperglycemia: Secondary | ICD-10-CM

## 2024-04-05 MED ORDER — PIOGLITAZONE HCL 15 MG PO TABS: 15.0000 mg | ORAL_TABLET | Freq: Every day | ORAL | 0 refills | Status: DC

## 2024-04-06 ENCOUNTER — Other Ambulatory Visit: Payer: Medicare Other

## 2024-04-06 ENCOUNTER — Encounter: Payer: Self-pay | Admitting: Endocrinology

## 2024-04-06 LAB — BASIC METABOLIC PANEL WITH GFR
BUN: 22 mg/dL (ref 7–25)
CO2: 26 mmol/L (ref 20–32)
Calcium: 8.6 mg/dL (ref 8.6–10.3)
Chloride: 106 mmol/L (ref 98–110)
Creat: 0.77 mg/dL (ref 0.70–1.28)
Glucose, Bld: 98 mg/dL (ref 65–99)
Potassium: 4.5 mmol/L (ref 3.5–5.3)
Sodium: 138 mmol/L (ref 135–146)
eGFR: 91 mL/min/{1.73_m2} (ref >=60)

## 2024-04-06 LAB — HEMOGLOBIN A1C
Hgb A1c MFr Bld: 5.1 % (ref <5.7)
Mean Plasma Glucose: 100 mg/dL
eAG (mmol/L): 5.5 mmol/L

## 2024-04-07 ENCOUNTER — Ambulatory Visit: Payer: Medicare Other | Attending: Cardiology | Admitting: Cardiology

## 2024-04-07 ENCOUNTER — Encounter: Payer: Self-pay | Admitting: Cardiology

## 2024-04-07 VITALS — BP 138/71 | HR 70 | Ht 72.0 in | Wt 220.0 lb

## 2024-04-07 DIAGNOSIS — E118 Type 2 diabetes mellitus with unspecified complications: Secondary | ICD-10-CM | POA: Diagnosis present

## 2024-04-07 DIAGNOSIS — I252 Old myocardial infarction: Secondary | ICD-10-CM | POA: Diagnosis present

## 2024-04-07 DIAGNOSIS — Z9861 Coronary angioplasty status: Secondary | ICD-10-CM | POA: Diagnosis present

## 2024-04-07 DIAGNOSIS — I10 Essential (primary) hypertension: Secondary | ICD-10-CM

## 2024-04-07 DIAGNOSIS — E785 Hyperlipidemia, unspecified: Secondary | ICD-10-CM

## 2024-04-07 DIAGNOSIS — I2510 Atherosclerotic heart disease of native coronary artery without angina pectoris: Secondary | ICD-10-CM | POA: Diagnosis present

## 2024-04-07 NOTE — Patient Instructions (Signed)
 Medication Instructions:  The current medical regimen is effective;  continue present plan and medications.  *If you need a refill on your cardiac medications before your next appointment, please call your pharmacy*  Follow-Up: At [facility], you and your health needs are our priority.  As part of our continuing mission to provide you with exceptional heart care, our providers are all part of one team.  This team includes your primary Cardiologist (physician) and Advanced Practice Providers or APPs (Physician Assistants and Nurse Practitioners) who all work together to provide you with the care you need, when you need it.  Your next appointment:   1 year(s)  Provider:   One of our Advanced Practice Providers (APPs): Melita Springer, PA-C  Friddie Jetty, NP Evaline Hill, NP  Theotis Flake, PA-C Lawana Pray, NP  Willis Harter, PA-C Lovette Rud, PA-C  [place], PA-C Ernest Dick, NP  Marlana Silvan, NP Marcie Sever, PA-C  Laquita Plant, PA-C    Dayna Dunn, PA-C  Scott Weaver, PA-C Palmer Bobo, NP Katlyn West, NP Callie Goodrich, PA-C  Evan Williams, PA-C Sheng Haley, PA-C  Xika Zhao, NP Kathleen Johnson, PA-C       We recommend signing up for the patient portal called "MyChart".  Sign up information is provided on this After Visit Summary.  MyChart is used to connect with patients for Virtual Visits (Telemedicine).  Patients are able to view lab/test results, encounter notes, upcoming appointments, etc.  Non-urgent messages can be sent to your provider as well.   To learn more about what you can do with MyChart, go to [email or web address].

## 2024-04-07 NOTE — Progress Notes (Signed)
 Cardiology Office Note:  .   Date:  04/07/2024  ID:  Alan Lopez, DOB 07-Aug-1946, MRN [id number] PCP: Arcadio Knuckles, MD  [facility] Providers Cardiologist:  Dorothye Gathers, MD    History of Present Illness: .   Alan Lopez is a 78 y.o. male Discussed the use of AI scribe software for clinical note transcription with the patient, who gave verbal consent to proceed.  History of Present Illness Jarome Lopez "bill" is a 78 year old male with coronary artery disease who presents for follow-up after prior drug eluting stent placement.  He has a history of coronary artery disease with a drug eluting stent placed in the right coronary artery following a non-ST elevation myocardial infarction on December 12, 2015. At that time, he experienced transient no reflow and had an LVEP of sixteen. His ejection fraction was between 45-50% with inferior wall hypokinesis noted.  He has a history of bradycardia which led to the discontinuation of metoprolol . He is on aspirin 81 mg daily and rosuvastatin  for LDL management, with his LDL currently at 36 mg/dL. He also takes losartan  25 mg for renal protection.  He has diabetes with blindness in one eye as a complication. His diabetes is managed with Jardiance  25 mg and insulin , and his hemoglobin A1c is 5.9%. He also takes Actos  15 mg and reports no signs of heart failure.  He recently experienced a fall at a [employer] race in [place], [place] , resulting in a torn right rotator cuff. He has consulted with an orthopedic surgeon, Dr. Rozelle Corning, regarding this injury.  He has swelling in one leg, which was evaluated with an ultrasound showing no signs of deep vein thrombosis. He uses ibuprofen  for pain management, although the pain is intermittent and not constant.  He is widowed, with his wife having passed away from cancer in 05-May-2018. He has never smoked.      Studies Reviewed: .        Results LABS LDL: 36 HbA1c: 5.9 Hb: 14.3 Cr:  0.94 ALT: 52 TSH: 1.8  RADIOLOGY Ultrasound: No signs of DVT. No cystic structures. Common femoral obstruction. Popliteal. Femoral left. (03/10/2024)  DIAGNOSTIC EKG: Normal (10/2023) Risk Assessment/Calculations:            Physical Exam:   VS:  BP 138/71   Pulse 70   Ht 6' (1.829 m)   Wt 220 lb (99.8 kg)   SpO2 93%   BMI 29.84 kg/m    Wt Readings from Last 3 Encounters:  04/07/24 220 lb (99.8 kg)  03/29/24 210 lb (95.3 kg)  10/28/23 210 lb 6.4 oz (95.4 kg)    GEN: Well nourished, well developed in no acute distress NECK: No JVD; No carotid bruits CARDIAC: RRR, no murmurs, no rubs, no gallops RESPIRATORY:  Clear to auscultation without rales, wheezing or rhonchi  ABDOMEN: Soft, non-tender, non-distended EXTREMITIES:  No edema; No deformity   ASSESSMENT AND PLAN: .    Assessment and Plan Assessment & Plan Coronary artery disease Coronary artery disease with prior drug-eluting stent placement in the right coronary artery following a non-ST elevation myocardial infarction in May 05, 2016. Heart rate is 70 bpm, and blood pressure is 138/71 mmHg. No changes in cardiac status. - Continue aspirin 81 mg daily. - Schedule follow-up in one year unless symptoms change.  Bradycardia Bradycardia, currently well-managed with heart rate at 70 bpm. Metoprolol  was discontinued due to bradycardia.  Hyperlipidemia Hyperlipidemia managed with rosuvastatin . LDL is well-controlled at 36 mg/dL. - Renew rosuvastatin  prescription.  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 Hemoglobin A1c is well-controlled at 5.9%. - Continue current diabetes management regimen including Jardiance  and insulin .  Rotator cuff tear Right rotator cuff tear following a fall. Previously evaluated by orthopedic surgeon Dr. Rozelle Corning. - Consider follow-up with orthopedic surgeon for further management.  Trigger finger Recurrent trigger finger, previously  treated with injection. Symptoms have returned. - Consult with [facility] for potential further intervention, including possible surgery.  Left calf/ leg pain swelling - No DVT on ultrasound - Likely muscle strain/tear.  - Discuss with ortho         Signed, Dorothye Gathers, MD

## 2024-04-08 ENCOUNTER — Encounter: Payer: Self-pay | Admitting: Endocrinology

## 2024-04-08 ENCOUNTER — Ambulatory Visit: Payer: Medicare Other | Admitting: Endocrinology

## 2024-04-08 DIAGNOSIS — Z794 Long term (current) use of insulin: Secondary | ICD-10-CM | POA: Diagnosis not present

## 2024-04-08 DIAGNOSIS — E118 Type 2 diabetes mellitus with unspecified complications: Secondary | ICD-10-CM

## 2024-04-08 MED ORDER — V-GO 20 20 UNIT/24HR KIT: 1.0000 | PACK | Freq: Every day | 11 refills | Status: AC

## 2024-04-08 NOTE — Patient Instructions (Signed)
 Decrease V-Go pump from 30-20 basal. You can continue the same bolus for meals 3-4 clicks with each meal.  Continue same dose of Jardiance  and Actos .

## 2024-04-08 NOTE — Progress Notes (Signed)
 Outpatient Endocrinology Note [place] Kensington Rios, MD  04/08/24  Patient's Name: Alan Lopez    DOB: [birth year]    MRN: [id number]                                                    REASON OF VISIT: Follow up of type 2 diabetes mellitus  PCP: Arcadio Knuckles, MD  HISTORY OF PRESENT ILLNESS:   Alan Lopez is a 78 y.o. old male with past medical history listed below, is here for follow up for type 2 diabetes mellitus.   Pertinent Diabetes History: Patient was diagnosed with type 2 diabetes mellitus in 1999.  He was started on V-Go pump in July 2018 because of inconsistent control and poor compliance with mealtime insulin .  Chronic Diabetes Complications : Retinopathy: no. Last ophthalmology exam was done on annually, following with ophthalmology regularly. 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  Nephropathy: no, on ACE/ARB /losartan . Peripheral neuropathy: yes numbness and tingling of the sole. Coronary artery disease: yes Stroke: no  Relevant comorbidities and cardiovascular risk factors: Obesity: no Body mass index is 29.95 kg/m.  Hypertension: Yes  Hyperlipidemia : Yes, on statin   Current / Home Diabetic regimen includes:  Actos  15 mg daily. Jardiance  25 mg daily. Current INSULIN  regimen: V-go pump SETTINGS: Basal rate = 30 units BOLUSES = 6-8 units at breakfast, 6-8 units at lunch and  6-8 units at dinner   INSULIN  type: Regular insulin   Prior diabetic medications: Glipizide , metformin  in the past.  Diarrhea with metformin .  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  He has been mostly taking the morning fasting some of the blood sugar 109, 98, 188, 78.  One of the day blood sugar was 65 around 8 AM.  He has not been taking in the evening or bedtime.  CGM/Dexcom was discussed in the past.  Patient reports that he had adhesive problem in the abdomen.  Hypoglycemia: Patient has no  hypoglycemic episodes. Patient has hypoglycemia awareness.  Factors modifying glucose control: 1.  Diabetic diet assessment: 3 meals a day.  He tried to limit high carb meals.  2.  Staying active or exercising:   3.  Medication compliance: compliant all of the time.  THYROID : Previously was found to have a 2 cm nodule on his left thyroid  on his exam but ultrasound in 04/2015 showed multinodular goiter with mostly small nodules; the largest nodule on the left side of 1.4 cm has the appearance of a pseudo- nodule   Thyroid  levels have been normal consistently.  Interval history  Glucometer data as reviewed above.  Hemoglobin A1c 5.1%.  Diabetes regimen as reviewed above.  He reports occasional hypoglycemic symptoms overnight.  He has not been checking blood sugar during those times.  No numbness and ting of the feet.  No vision problem.  No other complaints today.  REVIEW OF SYSTEMS As per history of present illness.   PAST MEDICAL HISTORY: Past Medical History:  Diagnosis Date   Blindness of left eye 04/12/2015   Bradycardia 01/07/2022   Beta-blocker DC'd due to bradycardia.   HTN (hypertension)    Hypertrophy (benign) of prostate    Multinodular goiter 04/12/2015   Non-insulin  dependent type 2 diabetes mellitus (HCC)  Status post non-ST elevation myocardial infarction (NSTEMI)    CATH   Tobacco abuse    Type II diabetes mellitus, uncontrolled 04/12/2015    PAST SURGICAL HISTORY: Past Surgical History:  Procedure Laterality Date   CORONARY ANGIOPLASTY WITH STENT PLACEMENT      ALLERGIES: Allergies  Allergen Reactions   Atorvastatin  Other (See Comments)   Codeine Other (See Comments)    Patient doesn't like how it makes him feel, "spaced out, not together"    FAMILY HISTORY:  Family History  Problem Relation Age of Onset   Diabetes Mother    Diabetes Brother    Cancer Brother    Heart disease Neg Hx     SOCIAL HISTORY: Social History   Socioeconomic History    Marital status: Widowed    Spouse name: Not on file   Number of children: 3   Years of education: Not on file   Highest education level: Not on file  Occupational History   Occupation: retired  Tobacco Use   Smoking status: Light Smoker    Current packs/day: 0.50    Average packs/day: 0.5 packs/day for 40.0 years (20.0 ttl pk-yrs)    Types: Cigarettes    Passive exposure: Past   Smokeless tobacco: Never   Tobacco comments:    smokes on social occassions  Vaping Use   Vaping status: Never Used  Substance and Sexual Activity   Alcohol use: Yes    Alcohol/week: 10.0 standard drinks of alcohol    Types: 10 Cans of beer per week    Comment: socially   Drug use: No   Sexual activity: Never  Other Topics Concern   Not on file  Social History Narrative   Not on file   Social Drivers of Health   Financial Resource Strain: Low Risk  (03/29/2024)   Overall Financial Resource Strain (CARDIA)    Difficulty of Paying Living Expenses: Not hard at all  Food Insecurity: No Food Insecurity (03/29/2024)   Hunger Vital Sign    Worried About Running Out of Food in the Last Year: Never true    Ran Out of Food in the Last Year: Never true  Transportation Needs: No Transportation Needs (03/29/2024)   PRAPARE - Administrator, Civil Service (Medical): No    Lack of Transportation (Non-Medical): No  Physical Activity: Insufficiently Active (03/29/2024)   Exercise Vital Sign    Days of Exercise per Week: 3 days    Minutes of Exercise per Session: 30 min  Stress: No Stress Concern Present (03/29/2024)   [employer] of Occupational Health - Occupational Stress Questionnaire    Feeling of Stress : Not at all  Social Connections: Socially Isolated (03/29/2024)   Social Connection and Isolation Panel [NHANES]    Frequency of Communication with Friends and Family: More than three times a week    Frequency of Social Gatherings with Friends and Family: Once a week    Attends Religious  Services: Never    Database administrator or Organizations: No    Attends Banker Meetings: Never    Marital Status: Widowed    MEDICATIONS:  Current Outpatient Medications  Medication Sig Dispense Refill   acetaminophen  (TYLENOL ) 500 MG tablet Take 500 mg by mouth every 6 (six) hours as needed for mild pain or moderate pain.     alfuzosin  (UROXATRAL ) 10 MG 24 hr tablet Take 1 tablet (10 mg total) by mouth daily with breakfast. 90 tablet 3   aspirin  EC 81 MG tablet Take 81 mg by mouth daily.     cetirizine (ZYRTEC) 10 MG tablet Take 10 mg by mouth daily.     cholecalciferol (VITAMIN D) 1000 UNITS tablet Take 1,000 Units by mouth daily.     dapsone 100 MG tablet Take 100 mg by mouth daily.     finasteride  (PROSCAR ) 5 MG tablet Take 1 tablet (5 mg total) by mouth daily. 90 tablet 3   fluticasone  (FLONASE ) 50 MCG/ACT nasal spray Place 2 sprays into both nostrils daily. 16 g 0   ibuprofen  (ADVIL ) 600 MG tablet Take 1 tablet (600 mg total) by mouth every 6 (six) hours as needed. 30 tablet 0   Insulin  Disposable Pump (V-GO 20) 20 UNIT/24HR KIT 1 kit by Does not apply route daily. USE AS DIRECT TO INJECT UP TO 20 UNITS AT 6:00AM DAILY 30 kit 11   Insulin  Pen Needle 32G X 4 MM MISC Use Three per day to inject insulin  100 each 1   insulin  regular (NOVOLIN R,HUMULIN R ) 100 units/mL injection Inject into the skin 3 (three) times daily before meals. USE INSULIN  TO FILL UP v-GO PUMP, use 3-5 units     JARDIANCE  25 MG TABS tablet TAKE 1 TABLET BY MOUTH DAILY 30 tablet 3   losartan  (COZAAR ) 25 MG tablet Take 1 tablet (25 mg total) by mouth daily. 90 tablet 3   Multiple Vitamin (MULTIVITAMIN WITH MINERALS) TABS tablet Take 1 tablet by mouth daily.     pioglitazone  (ACTOS ) 15 MG tablet Take 1 tablet (15 mg total) by mouth daily. 90 tablet 0   rosuvastatin  (CRESTOR ) 10 MG tablet Take 1 tablet (10 mg total) by mouth at bedtime. 90 tablet 3   No current facility-administered medications for this  visit.    PHYSICAL EXAM: Vitals:   04/08/24 0922 04/08/24 0923  BP: (!) 172/70 (!) 170/70  Pulse: 62   Resp: 20   SpO2: 91%   Weight: 220 lb 12.8 oz (100.2 kg)   Height: 6' (1.829 m)     Body mass index is 29.95 kg/m.  Wt Readings from Last 3 Encounters:  04/08/24 220 lb 12.8 oz (100.2 kg)  04/07/24 220 lb (99.8 kg)  03/29/24 210 lb (95.3 kg)    General: Well developed, well nourished male in no apparent distress.  HEENT: AT/[place], no external lesions.  Eyes: Conjunctiva clear and no icterus. Neck: Neck supple  Lungs: Respirations not labored Neurologic: Alert, oriented, normal speech Extremities / Skin: Dry.   Psychiatric: Does not appear depressed or anxious  Diabetic Foot Exam - Simple   No data filed    LABS Reviewed Lab Results  Component Value Date   HGBA1C 5.1 04/05/2024   HGBA1C 5.9 (H) 12/31/2023   HGBA1C 6.2 10/01/2023   Lab Results  Component Value Date   FRUCTOSAMINE 225 05/01/2020   FRUCTOSAMINE 294 (H) 06/30/2017   FRUCTOSAMINE 255 09/09/2016   Lab Results  Component Value Date   CHOL 128 10/01/2023   HDL 67.20 10/01/2023   LDLCALC 36 10/01/2023   TRIG 120.0 10/01/2023   CHOLHDL 2 10/01/2023   Lab Results  Component Value Date   MICRALBCREAT 1.7 10/01/2023   MICRALBCREAT 1.3 10/01/2022   Lab Results  Component Value Date   CREATININE 0.77 04/05/2024   Lab Results  Component Value Date   GFR 83.45 10/01/2023    ASSESSMENT / PLAN  1. Controlled type 2 diabetes mellitus with complication, with long-term current use of insulin  (HCC)  Diabetes Mellitus type 2, complicated by CAD/peripheral neuropathy. - Diabetic status / severity: Controlled  Lab Results  Component Value Date   HGBA1C 5.1 04/05/2024    - Hemoglobin A1c goal : <7%  Hemoglobin A1c 5.1%.  Discussed about concern of possible hypoglycemia.  He reports he may have hypoglycemia symptoms however he corrected with eating.  Not recorded on glucometer.  Asked to  monitor blood sugar more often especially when he has hypoglycemic symptoms.  Discussed about continuous glucose monitoring, patient wants to think about it he declined at this time.   - Medications: See below.  No change.  Actos  15 mg daily. Jardiance  25 mg daily. Current INSULIN  regimen: V-go pump SETTINGS: Basal rate = 30 units, decrease from basal 30 to 20 units. Continue BOLUSES = 6-8 units at breakfast, 6-8 units at lunch and  6-8 units at dinner   INSULIN  type: Regular insulin   - Home glucose testing: Before meals and at bedtime. Encouraged to use CGM. - Discussed/ Gave Hypoglycemia treatment plan.  # Consult : not required at this time.   # Annual urine for microalbuminuria/ creatinine ratio, no microalbuminuria currently, continue ACE/ARB /losartan . Last  Lab Results  Component Value Date   MICRALBCREAT 1.7 10/01/2023    # Foot check nightly / neuropathy.  # Annual dilated diabetic eye exams.   - Diet: Make healthy diabetic food choices - Life style / activity / exercise: Discussed.  2. Blood pressure  -  BP Readings from Last 1 Encounters:  04/08/24 (!) 170/70    - Control is not in target.  Patient reports he had normal blood pressure 139 systolic yesterday.  Patient is asked to monitor blood pressure at home, he has blood pressure machine. If stays high , asked to talk with primary care provider or cardiology.  He is asymptomatic in the clinic today. - No change in current plans.  3. Lipid status / Hyperlipidemia - Last  Lab Results  Component Value Date   LDLCALC 36 10/01/2023   - Continue rosuvastatin  20 mg daily.    Diagnoses and all orders for this visit:  Controlled type 2 diabetes mellitus with complication, with long-term current use of insulin  (HCC) -     Insulin  Disposable Pump (V-GO 20) 20 UNIT/24HR KIT; 1 kit by Does not apply route daily. USE AS DIRECT TO INJECT UP TO 20 UNITS AT 6:00AM DAILY    DISPOSITION Follow up in clinic in 3  months suggested.   All questions answered and patient verbalized understanding of the plan.  [place] Diahn Waidelich, MD [facility] Endocrinology [facility] [address] [place], [address] [place], [place] [address] Phone # [phone]  At least part of this note was generated using voice recognition software. Inadvertent word errors may have occurred, which were not recognized during the proofreading process.

## 2024-05-06 ENCOUNTER — Telehealth: Payer: Self-pay | Admitting: Internal Medicine

## 2024-05-06 NOTE — Telephone Encounter (Signed)
 Copied from CRM [id number]. Topic: Clinical - Medical Advice >> May 06, 2024  9:32 AM Rosaria Common wrote: Reason for CRM: Pt's daughter calling to request O2 for her father who is dealing with pneumonia and currently in [place] . Callback number is [phone].

## 2024-05-06 NOTE — Telephone Encounter (Signed)
 Spoke with Pattie Borders again; she states that they need an Oxygen regulator for the pt's home here in . Pt should be coming home on Saturday 5.31.25 and his daughter is asking if the Oxygen will be ready for him by the time he is home.   Pt sch for HFU on 6.2.25 w/ Cathalene Clipper.

## 2024-05-06 NOTE — Telephone Encounter (Signed)
 Heather called back to add if the order can be sent to [facility] in [place], [place] tele: [phone].

## 2024-05-06 NOTE — Telephone Encounter (Signed)
 Spoke to pt's daughter, Bedford Bowens. Pt was released from a hospital in [place] yesterday with a pneumonia dx and they are trying to raise his OX levels so he can travel home.   Pt has another daughter (name unknown) in [place] who he is currently staying with. Pattie Borders states she will call her sister, than call me back with clarification on what is needed from us .

## 2024-05-07 ENCOUNTER — Telehealth: Payer: Self-pay

## 2024-05-07 NOTE — Telephone Encounter (Signed)
 Patient has been made aware of stephanie's comments and gave a verbal understanding.

## 2024-05-07 NOTE — Transitions of Care (Post Inpatient/ED Visit) (Signed)
 05/07/2024  Name: Alan Lopez MRN: [id number] DOB: [birth year]  Today's TOC FU Call Status: Today's TOC FU Call Status:: Successful TOC FU Call Completed Unsuccessful Call (1st Attempt) Date: 05/07/24 [facility] FU Call Complete Date: 05/07/24 Patient's Name and Date of Birth confirmed.  Transition Care Management Follow-up Telephone Call Date of Discharge: 05/05/24 Discharge Facility: Other [facility]) Name of Other (Non[facility]) Discharge Facility: [facility] Type of Discharge: Inpatient Admission Primary Inpatient Discharge Diagnosis:: Acute Hypoxic Respiratory Failure How have you been since you were released from the hospital?: Same Any questions or concerns?: Yes Patient Questions/Concerns Addressed: Notified Provider of Patient Questions/Concerns  Items Reviewed: Did you receive and understand the discharge instructions provided?: Yes Medications obtained,verified, and reconciled?: Yes (Medications Reviewed) Any new allergies since your discharge?: No Dietary orders reviewed?: NA Do you have support at home?: Yes People in Home [RPT]: child(ren), adult (while in [place] )  Medications Reviewed Today: Medications Reviewed Today     Reviewed by Cathye Coca, LPN (Licensed Practical Nurse) on 05/07/24 at 1501  Med List Status: <None>   Medication Order Taking? Sig Documenting Provider Last Dose Status Informant  acetaminophen  (TYLENOL ) 500 MG tablet [id number]  Take 500 mg by mouth every 6 (six) hours as needed for mild pain or moderate pain. [provider]  Active   alfuzosin  (UROXATRAL ) 10 MG 24 hr tablet [id number]  Take 1 tablet (10 mg total) by mouth daily with breakfast. Marco Severs, MD  Active   aspirin EC 81 MG tablet [id number]  Take 81 mg by mouth daily. [provider]  Active   cetirizine (ZYRTEC) 10 MG tablet [id number]  Take 10 mg by mouth daily. [provider]  Active   cholecalciferol (VITAMIN D) 1000  UNITS tablet [id number]  Take 1,000 Units by mouth daily. [provider]  Active   dapsone 100 MG tablet [id number]  Take 100 mg by mouth daily. [provider]  Active   finasteride  (PROSCAR ) 5 MG tablet [id number]  Take 1 tablet (5 mg total) by mouth daily. McKenzie, Arden Beck, MD  Active   fluticasone  (FLONASE ) 50 MCG/ACT nasal spray [id number]  Place 2 sprays into both nostrils daily. Leath-Warren, Belen Bowers, NP  Active   ibuprofen  (ADVIL ) 600 MG tablet [id number]  Take 1 tablet (600 mg total) by mouth every 6 (six) hours as needed. Reddick, Arcadio Knuckles B, NP  Active   Insulin  Disposable Pump (V-GO 20) 20 UNIT/24HR KIT [id number]  1 kit by Does not apply route daily. USE AS DIRECT TO INJECT UP TO 20 UNITS AT 6:00AM DAILY Thapa, [place], MD  Active   Insulin  Pen Needle 32G X 4 MM MISC [id number]  Use Three per day to inject insulin  Lajean Pike, MD  Active   insulin  regular (NOVOLIN R,HUMULIN R ) 100 units/mL injection [id number]  Inject into the skin 3 (three) times daily before meals. USE INSULIN  TO FILL UP v-GO PUMP, use 3-5 units [provider]  Active   JARDIANCE  25 MG TABS tablet [id number]  TAKE 1 TABLET BY MOUTH DAILY Thapa, [place], MD  Active   losartan  (COZAAR ) 25 MG tablet [id number]  Take 1 tablet (25 mg total) by mouth daily. Hugh Madura, MD  Active            Med Note Meredith Stalls Sep 22, 2023  8:47 AM) Take 1/2 per day   Multiple Vitamin (MULTIVITAMIN WITH MINERALS) TABS tablet [id number]  Take 1 tablet by mouth daily. [provider]  Active   pioglitazone  (ACTOS ) 15 MG tablet [id number]  Take 1 tablet (15 mg total) by mouth daily. Thapa, [place], MD  Active   rosuvastatin  (CRESTOR ) 10 MG tablet [id number]  Take 1 tablet (10 mg total) by mouth at bedtime. Hugh Madura, MD  Active            Med Note Meredith Stalls Sep 22, 2023  8:45 AM) Cuts in 1/2             Home Care and Equipment/Supplies: Were Home Health Services Ordered?:  NA Any new equipment or medical supplies ordered?: Yes Name of Medical supply agency?: [employer]- acct # [id number] Were you able to get the equipment/medical supplies?: Yes (only e tank and concentrator to use while in [place] ) Do you have any questions related to the use of the equipment/supplies?: Yes What questions do you have?: (S) Needs to fly home on 5/31 but regulations do not allow e tank for flying and when he returns home does not have any oxygen equipment to use until appt  Functional Questionnaire: Do you need assistance with bathing/showering or dressing?: No Do you need assistance with meal preparation?: No Do you need assistance with eating?: No Do you have difficulty maintaining continence: No Do you need assistance with getting out of bed/getting out of a chair/moving?: No Do you have difficulty managing or taking your medications?: No  Follow up appointments reviewed: PCP Follow-up appointment confirmed?: Yes Date of PCP follow-up appointment?: 05/10/24 Follow-up Provider: Cathalene Clipper NP [facility] Follow-up appointment confirmed?: NA Do you need transportation to your follow-up appointment?: No Do you understand care options if your condition(s) worsen?: Yes-patient verbalized understanding    SIGNATURE Seabron Cypress, LPN [facility] Health Advisor [facility] You Are. We Are. One [facility] Direct Dial [phone]

## 2024-05-07 NOTE — Telephone Encounter (Signed)
**Note De-identified  [place] Obfuscation** Please advise 

## 2024-05-07 NOTE — Transitions of Care (Post Inpatient/ED Visit) (Signed)
   05/07/2024  Name: Alan Lopez MRN: [id number] DOB: 10/10/1946  Today's TOC FU Call Status: Today's TOC FU Call Status:: Unsuccessful Call (1st Attempt) Unsuccessful Call (1st Attempt) Date: 05/07/24  Attempted to reach the patient regarding the most recent Inpatient/ED visit.  Follow Up Plan: Additional outreach attempts will be made to reach the patient to complete the Transitions of Care (Post Inpatient/ED visit) call.   Signature  Seabron Cypress, LPN [facility] Health Advisor [facility] You Are. We Are. One [facility] Direct Dial [phone]

## 2024-05-07 NOTE — Telephone Encounter (Signed)
 Patient states that he was seen in [place] and he needs an Oxygen order. The notes are in his chart I cannot figure out how much Oxygen he needs or for how long. Please advise, He states that he can not go long periods of time without his oxygen.

## 2024-05-07 NOTE — Telephone Encounter (Signed)
 Copied from CRM [id number]. Topic: Appointments - Scheduling Inquiry for Clinic >> May 07, 2024 10:07 AM Magdalene School wrote: Pattie Borders, patient's daughter calling for an update on oxygen order for her father. She would like a call back on [phone]. Pattie Borders is frustrated because the order is taking too long and stated that her father is private paying for oxygen because it will take too long approve through insurance.  She would like the order sent to: [facility] Address: [address] [place], [place], [place] [address] Phone: [phone] Fax: [phone]  Pattie Borders would like a call back when order has been sent.

## 2024-05-10 ENCOUNTER — Telehealth: Payer: Self-pay | Admitting: Internal Medicine

## 2024-05-10 ENCOUNTER — Ambulatory Visit: Admitting: Family Medicine

## 2024-05-10 ENCOUNTER — Encounter: Payer: Self-pay | Admitting: Family Medicine

## 2024-05-10 VITALS — BP 144/66 | HR 72 | Temp 98.1°F | Resp 20 | Ht 72.0 in | Wt 214.0 lb

## 2024-05-10 DIAGNOSIS — R0902 Hypoxemia: Secondary | ICD-10-CM | POA: Diagnosis not present

## 2024-05-10 DIAGNOSIS — Z8701 Personal history of pneumonia (recurrent): Secondary | ICD-10-CM | POA: Diagnosis not present

## 2024-05-10 DIAGNOSIS — R7989 Other specified abnormal findings of blood chemistry: Secondary | ICD-10-CM | POA: Diagnosis not present

## 2024-05-10 DIAGNOSIS — Z09 Encounter for follow-up examination after completed treatment for conditions other than malignant neoplasm: Secondary | ICD-10-CM | POA: Diagnosis not present

## 2024-05-10 DIAGNOSIS — J439 Emphysema, unspecified: Secondary | ICD-10-CM

## 2024-05-10 DIAGNOSIS — R600 Localized edema: Secondary | ICD-10-CM | POA: Diagnosis not present

## 2024-05-10 LAB — TSH: TSH: 2.63 u[IU]/mL (ref 0.35–5.50)

## 2024-05-10 LAB — BRAIN NATRIURETIC PEPTIDE: Pro B Natriuretic peptide (BNP): 75 pg/mL (ref 0.0–100.0)

## 2024-05-10 NOTE — Progress Notes (Signed)
 Assessment & Plan:  1. History of recent pneumonia (Primary) Patient completed antibiotics. Symptoms resolved.  2. Pulmonary emphysema, unspecified emphysema type (HCC) Noted on imaging. Referring to pulmonology for PFTs. - Ambulatory referral to Pulmonology  3. Hypoxemia - For home use only DME oxygen @ 2L via [place]  4. Lower extremity edema Encouraged elevation of feet when sitting and decreasing salt intake.  - Brain natriuretic peptide - TSH  5. Elevated brain natriuretic peptide (BNP) level - Brain natriuretic peptide  6. Hospital discharge follow-up   Follow up plan: Return if symptoms worsen or fail to improve.  Hershel Los, MSN, APRN, FNP-C  Subjective:  HPI: Jaydeen Odor is a 78 y.o. male presenting on 05/10/2024 for Leg Swelling (Occasional bilateral leg swelling. DVT studies were done and negative per pt) and Transitions Of Care (Was seen in ED in [place]  on 05/03/24 for Pneumonia- feeling better now)  Patient is accompanied by his daughter, who he is okay with being present.  Follow up Hospitalization  Patient was admitted to [facility] on 05/03/2024 and discharged on 05/05/2024. He was treated for acute hypoxic respiratory failure, sepsis, and community acquired pneumonia. Treatment for this included antibiotics, Mucinex, duonebs, and supplemental oxygen. Telephone follow up was done on 05/07/2024 He reports excellent compliance with treatment. He has completed antibiotics that he received upon discharge. He has not required the Albuterol  inhaler.  He reports this condition is resolved and he is back to his baseline. He has been monitoring his oxygen saturation at home and reports he ranges 84-88%.  It was recommended he establish with pulmonology for PFTs and a sleep study.    ROS: Negative unless specifically indicated above in HPI.   Relevant past medical history reviewed and updated as indicated.   Allergies and medications  reviewed and updated.   Current Outpatient Medications:    acetaminophen  (TYLENOL ) 500 MG tablet, Take 500 mg by mouth every 6 (six) hours as needed for mild pain or moderate pain., Disp: , Rfl:    albuterol  (VENTOLIN  HFA) 108 (90 Base) MCG/ACT inhaler, Inhale 2 puffs into the lungs., Disp: , Rfl:    alfuzosin  (UROXATRAL ) 10 MG 24 hr tablet, Take 1 tablet (10 mg total) by mouth daily with breakfast., Disp: 90 tablet, Rfl: 3   aspirin EC 81 MG tablet, Take 81 mg by mouth daily., Disp: , Rfl:    cetirizine (ZYRTEC) 10 MG tablet, Take 10 mg by mouth daily., Disp: , Rfl:    cholecalciferol (VITAMIN D) 1000 UNITS tablet, Take 1,000 Units by mouth daily., Disp: , Rfl:    dapsone 100 MG tablet, Take 100 mg by mouth daily., Disp: , Rfl:    finasteride  (PROSCAR ) 5 MG tablet, Take 1 tablet (5 mg total) by mouth daily., Disp: 90 tablet, Rfl: 3   fluticasone  (FLONASE ) 50 MCG/ACT nasal spray, Place 2 sprays into both nostrils daily., Disp: 16 g, Rfl: 0   Fluticasone -Salmeterol 55-14 MCG/ACT AEPB, Inhale 1 puff twice a day by inhalation route., Disp: , Rfl:    ibuprofen  (ADVIL ) 600 MG tablet, Take 1 tablet (600 mg total) by mouth every 6 (six) hours as needed., Disp: 30 tablet, Rfl: 0   Insulin  Disposable Pump (V-GO 20) 20 UNIT/24HR KIT, 1 kit by Does not apply route daily. USE AS DIRECT TO INJECT UP TO 20 UNITS AT 6:00AM DAILY, Disp: 30 kit, Rfl: 11   Insulin  Pen Needle 32G X 4 MM MISC, Use Three per day to inject insulin , Disp: 100 each,  Rfl: 1   insulin  regular (NOVOLIN R,HUMULIN R ) 100 units/mL injection, Inject into the skin 3 (three) times daily before meals. USE INSULIN  TO FILL UP v-GO PUMP, use 3-5 units, Disp: , Rfl:    JARDIANCE  25 MG TABS tablet, TAKE 1 TABLET BY MOUTH DAILY, Disp: 30 tablet, Rfl: 3   losartan  (COZAAR ) 25 MG tablet, Take 1 tablet (25 mg total) by mouth daily., Disp: 90 tablet, Rfl: 3   Multiple Vitamin (MULTIVITAMIN WITH MINERALS) TABS tablet, Take 1 tablet by mouth daily., Disp: ,  Rfl:    pioglitazone  (ACTOS ) 15 MG tablet, Take 1 tablet (15 mg total) by mouth daily., Disp: 90 tablet, Rfl: 0   rosuvastatin  (CRESTOR ) 10 MG tablet, Take 1 tablet (10 mg total) by mouth at bedtime., Disp: 90 tablet, Rfl: 3  Allergies  Allergen Reactions   Atorvastatin  Other (See Comments)   Codeine Other (See Comments)    Patient doesn't like how it makes him feel, "spaced out, not together"  Doesn't like the way it makes him feel    Objective:   BP (!) 144/66   Pulse 72   Temp 98.1 F (36.7 C)   Resp 20   Ht 6' (1.829 m)   Wt 214 lb (97.1 kg)   SpO2 (!) 87%   BMI 29.02 kg/m    Physical Exam Vitals reviewed.  Constitutional:      General: He is not in acute distress.    Appearance: Normal appearance. He is not ill-appearing, toxic-appearing or diaphoretic.  HENT:     Head: Normocephalic and atraumatic.  Eyes:     General: No scleral icterus.       Right eye: No discharge.        Left eye: No discharge.     Conjunctiva/sclera: Conjunctivae normal.  Cardiovascular:     Rate and Rhythm: Normal rate and regular rhythm.     Heart sounds: Normal heart sounds. No murmur heard.    No friction rub. No gallop.  Pulmonary:     Effort: Pulmonary effort is normal. No respiratory distress.     Breath sounds: Normal breath sounds. No stridor. No wheezing, rhonchi or rales.  Musculoskeletal:        General: Normal range of motion.     Cervical back: Normal range of motion.  Skin:    General: Skin is warm and dry.  Neurological:     Mental Status: He is alert and oriented to person, place, and time. Mental status is at baseline.  Psychiatric:        Mood and Affect: Mood normal.        Behavior: Behavior normal.        Thought Content: Thought content normal.        Judgment: Judgment normal.

## 2024-05-10 NOTE — Telephone Encounter (Signed)
 Copied from CRM [id number]. Topic: Referral - Request for Referral >> May 07, 2024  6:10 PM Corin V wrote: Did the patient discuss referral with their provider in the last year? Yes (If No - schedule appointment) (If Yes - send message)  Appointment offered? No- HFU on 6/2 already scheduled for issue  Type of order/referral and detailed reason for visit: Pulmonologist- needs oxygen  Preference of office, provider, location: n/a  If referral order, have you been seen by this specialty before? No (If Yes, this issue or another issue? When? Where?  Can we respond through MyChart? Yes

## 2024-05-10 NOTE — Telephone Encounter (Signed)
 This has been handled at his office visit today with Brittney.

## 2024-05-11 ENCOUNTER — Ambulatory Visit: Payer: Self-pay | Admitting: Family Medicine

## 2024-05-19 ENCOUNTER — Encounter: Payer: Self-pay | Admitting: Pulmonary Disease

## 2024-05-19 ENCOUNTER — Ambulatory Visit (INDEPENDENT_AMBULATORY_CARE_PROVIDER_SITE_OTHER): Admitting: Pulmonary Disease

## 2024-05-19 VITALS — BP 167/78 | HR 62 | Ht 72.0 in | Wt 212.0 lb

## 2024-05-19 DIAGNOSIS — J439 Emphysema, unspecified: Secondary | ICD-10-CM | POA: Diagnosis not present

## 2024-05-19 DIAGNOSIS — Z87891 Personal history of nicotine dependence: Secondary | ICD-10-CM

## 2024-05-19 NOTE — Patient Instructions (Signed)
 VISIT SUMMARY:  You were seen today for a follow-up after your recent hospitalization for pneumonia. We discussed your history of emphysema, coronary artery disease, diabetes, and tuberculoid leprosy. You have been recovering well from pneumonia and we have outlined a plan to monitor and manage your health conditions moving forward.  YOUR PLAN:  -COMMUNITY-ACQUIRED PNEUMONIA: Community-acquired pneumonia is a lung infection acquired outside of a hospital setting. You were treated with antibiotics and are currently recovering. You should stop using oxygen therapy unless your oxygen levels drop below 88%. We will schedule a lung function test and a repeat CT scan in three months to ensure the pneumonia has resolved. Gradually increase your exercise and activity levels.  -POSSIBLE COPD: Chronic Obstructive Pulmonary Disease (COPD) is a chronic inflammatory lung disease that obstructs airflow from the lungs. Given your smoking history and emphysema noted in past imaging, we will evaluate for COPD after you have fully recovered from pneumonia. Please do not resume smoking. We will schedule a lung function test in three months to assess for COPD.  -DIABETES MELLITUS: Diabetes mellitus is a condition that affects how your body processes blood sugar. Your diabetes is currently managed with an insulin  pump. Continue with your current management plan.  -CORONARY ARTERY DISEASE WITH HISTORY OF STENT PLACEMENT: Coronary artery disease is a condition where the heart's blood vessels are narrowed or blocked. You had a stent placed after a heart attack in 2017. Continue with your current management plan.  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 It is being managed with Dapsone under the care of a dermatologist. Continue with your current treatment plan.  INSTRUCTIONS:  Please stop using oxygen therapy unless your oxygen levels drop below 88%. We will  schedule a lung function test and a repeat CT scan in three months to monitor your recovery from pneumonia and evaluate for COPD. Continue with your current management plans for diabetes, coronary artery disease, and tuberculoid leprosy. Gradually increase your exercise and activity levels. Do not resume smoking.

## 2024-05-19 NOTE — Progress Notes (Signed)
 Alan Lopez    [id number]    Jul 28, 1946  Primary Care Physician:Jones, Randa Burton, MD  Referring Physician: Zorita Hiss, FNP [address] [place],  [place] [address]  Chief complaint: Consult for COPD evaluation, recent hospitalization for pneumonia  HPI: 78 y.o. 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  Discussed the use of AI scribe software for clinical note transcription with the patient, who gave verbal consent to proceed.  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 He was referred by Dr. Rochelle Chu for evaluation of emphysema.  He was recently hospitalized in [place]  in May 2025 for right upper lobe pneumonia and received IV antibiotics, including ceftriaxone and azithromycin . Upon discharge, he was prescribed Augmentin  and azithromycin . During his hospital stay, he was started on oxygen therapy and was discharged on 1-2 liters of oxygen. At home, his oxygen saturation levels range from 86 to 89% without supplemental oxygen.  He has a history of coronary artery disease with a stent placed after a myocardial infarction in 2017 and diabetes managed with an insulin  pump. He quit smoking last month after a 30-year history of smoking half a pack per day. He has not used inhalers before, although he was given a rescue inhaler recently which he has not yet used. He has been on prednisone  in the past for flu-related chest congestion.  He has a history of tuberculoid leprosy, for which he has been treated with Dapsone for about a year. A  dermatologist has tried several treatments including salves.  No current use of inhalers. Oxygen levels drop to mid-80s during physical activity, especially in hot weather.  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 Exposures: No mold, hot tub, Jacuzzi.  No feather pillows or comforters No h/o chemo/XRT/amiodarone/macrodantin/MTX  No exposure to asbestos, silica or other organic allergens  Smoking history: 20-pack-year smoker.  Quit in May 2025 Travel history: From [place] .He recently visited [place]  to see his sister-in-law, who is in poor health, and stayed longer than intended due to his hospitalization. Relevant family history:Family history includes a great uncle with asthma and a daughter with asthma. His wife, who was a COPD patient, died of ovarian cancer.    Outpatient Encounter Medications as of 05/19/2024  Medication Sig   acetaminophen  (TYLENOL ) 500 MG tablet Take 500 mg by mouth every 6 (six) hours as needed for mild pain or moderate pain.   albuterol  (VENTOLIN  HFA) 108 (90 Base) MCG/ACT inhaler Inhale 2 puffs into the lungs.   alfuzosin  (UROXATRAL ) 10 MG 24 hr tablet Take 1 tablet (10 mg total) by mouth daily with breakfast.   aspirin EC 81 MG tablet Take 81 mg by mouth daily.   cetirizine (ZYRTEC) 10 MG tablet Take 10 mg by mouth daily.   cholecalciferol (VITAMIN D) 1000 UNITS tablet Take 1,000 Units by mouth daily.   dapsone 100 MG tablet Take 100 mg by mouth daily.   finasteride  (PROSCAR ) 5 MG tablet Take 1 tablet (5 mg total) by mouth daily.   fluticasone  (FLONASE ) 50 MCG/ACT nasal spray Place 2 sprays into both nostrils daily.   Fluticasone -Salmeterol  55-14 MCG/ACT AEPB Inhale 1 puff twice a day by inhalation route.   ibuprofen  (ADVIL ) 600 MG tablet Take 1 tablet (600 mg total) by mouth every 6 (six) hours as needed.   Insulin  Disposable Pump (V-GO 20) 20 UNIT/24HR KIT 1 kit by Does not apply route daily. USE AS DIRECT TO  INJECT UP TO 20 UNITS AT 6:00AM DAILY   Insulin  Pen Needle 32G X 4 MM MISC Use Three per day to inject insulin    insulin  regular (NOVOLIN R,HUMULIN R ) 100 units/mL injection Inject into the skin 3 (three) times daily before meals. USE INSULIN  TO FILL UP v-GO PUMP, use 3-5 units   JARDIANCE  25 MG TABS tablet TAKE 1 TABLET BY MOUTH DAILY   losartan  (COZAAR ) 25 MG tablet Take 1 tablet (25 mg total) by mouth daily.   Multiple Vitamin (MULTIVITAMIN WITH MINERALS) TABS tablet Take 1 tablet by mouth daily.   pioglitazone  (ACTOS ) 15 MG tablet Take 1 tablet (15 mg total) by mouth daily.   rosuvastatin  (CRESTOR ) 10 MG tablet Take 1 tablet (10 mg total) by mouth at bedtime.   No facility-administered encounter medications on file as of 05/19/2024.    Allergies as of 05/19/2024 - Review Complete 05/19/2024  Allergen Reaction Noted   Atorvastatin  Other (See Comments) 10/06/2018   Codeine Other (See Comments) 04/12/2015    Past Medical History:  Diagnosis Date   Blindness of left eye 04/12/2015   Bradycardia 01/07/2022   Beta-blocker DC'd due to bradycardia.   HTN (hypertension)    Hypertrophy (benign) of prostate    Multinodular goiter 04/12/2015   Non-insulin  dependent type 2 diabetes mellitus (HCC)    Status post non-ST elevation myocardial infarction (NSTEMI)    CATH   Tobacco abuse    Type II diabetes mellitus, uncontrolled 04/12/2015    Past Surgical History:  Procedure Laterality Date   CORONARY ANGIOPLASTY WITH STENT PLACEMENT      Family History  Problem Relation Age of Onset   Diabetes Mother    Diabetes Brother    Cancer Brother    Heart disease Neg Hx     Social History   Socioeconomic History   Marital status: Widowed    Spouse name: Not on file   Number of children: 3   Years of education: Not on file   Highest education level: Not on file  Occupational History   Occupation: retired  Tobacco Use   Smoking status: Light Smoker    Current packs/day: 0.50    Average  packs/day: 0.5 packs/day for 40.0 years (20.0 ttl pk-yrs)    Types: Cigarettes    Passive exposure: Past   Smokeless tobacco: Never   Tobacco comments:    smokes on social occassions  Vaping Use   Vaping status: Never Used  Substance and Sexual Activity   Alcohol use: Yes    Alcohol/week: 10.0 standard drinks of alcohol    Types: 10 Cans of beer per week    Comment: socially   Drug use: No   Sexual activity: Never  Other Topics Concern   Not on file  Social History Narrative   Not on file   Social Drivers of Health   Financial Resource Strain: Low Risk  (03/29/2024)   Overall Financial Resource Strain (CARDIA)    Difficulty of Paying Living Expenses: Not hard at all  Food Insecurity: Low Risk  (05/04/2024)   Received from [facility]   [facility] Food Security    Within the past 12 months, the food you  bought just didn't last and you didn't have money to get more.: 3    Within the past 12 months, you worried that your food would run out before you got money to buy more.: 3  Transportation Needs: Not At Risk (05/04/2024)   Received from [facility]   [facility] Transportation Needs    In the past 12 months, has lack of reliable transportation kept you from medical appointments, meetings, work or from getting things needed for daily living?: No  Physical Activity: Insufficiently Active (03/29/2024)   Exercise Vital Sign    Days of Exercise per Week: 3 days    Minutes of Exercise per Session: 30 min  Stress: No Stress Concern Present (03/29/2024)   [employer] of Occupational Health - Occupational Stress Questionnaire    Feeling of Stress : Not at all  Social Connections: Socially Isolated (03/29/2024)   Social Connection and Isolation Panel [NHANES]    Frequency of Communication with Friends and Family: More than three times a week    Frequency of Social Gatherings with Friends and Family: Once a week    Attends Religious Services: Never    Database administrator or Organizations: No     Attends Banker Meetings: Never    Marital Status: Widowed  Intimate Partner Violence: Not At Risk (05/04/2024)   Received from [facility]   [facility] Safety    How often does anyone, including family and friends, threaten you with harm?: 1    How often does anyone, including family and friends, insult or talk down to you?: 1    How often does anyone, including family and friends, physically hurt you?: 1    How often does anyone, including family and friends, scream or curse at you?: 1    Review of systems: Review of Systems  Constitutional: Negative for fever and chills.  HENT: Negative.   Eyes: Negative for blurred vision.  Respiratory: as per HPI  Cardiovascular: Negative for chest pain and palpitations.  Gastrointestinal: Negative for vomiting, diarrhea, blood per rectum. Genitourinary: Negative for dysuria, urgency, frequency and hematuria.  Musculoskeletal: Negative for myalgias, back pain and joint pain.  Skin: Negative for itching and rash.  Neurological: Negative for dizziness, tremors, focal weakness, seizures and loss of consciousness.  Endo/Heme/Allergies: Negative for environmental allergies.  Psychiatric/Behavioral: Negative for depression, suicidal ideas and hallucinations.  All other systems reviewed and are negative.  Physical Exam: Blood pressure (!) 167/78, pulse 62, height 6' (1.829 m), weight 212 lb (96.2 kg), SpO2 91%. Gen:      No acute distress HEENT:  EOMI, sclera anicteric Neck:     No masses; no thyromegaly Lungs:    Clear to auscultation bilaterally; normal respiratory effort CV:         Regular rate and rhythm; no murmurs Abd:      + bowel sounds; soft, non-tender; no palpable masses, no distension Ext:    No edema; adequate peripheral perfusion Skin:      Warm and dry; no rash Neuro: alert and oriented x 3 Psych: normal mood and affect  Data Reviewed: Imaging: Chest x-ray 10/28/2023-hyperinflation, mild parenchymal scarring.  I have  reviewed the images personally.   [facility] 05/03/2024 IMPRESSION:  1. No evidence of pulmonary embolus.  2.  Patchy right upper lobe airspace disease that is likely  inflammatory or infectious.   PFTs:  Labs:  Assessment & Plan Community-acquired pneumonia Recent hospitalization for right upper lobe pneumonia, treated with IV antibiotics and discharged on  oral antibiotics. Negative for COVID-19, influenza, Legionella, Streptococcus pneumoniae, and Mycoplasma. Blood cultures were negative. Currently recovering with improved oxygen levels. - Stop oxygen therapy unless oxygen levels drop below 88% - Schedule lung function test in three months - Repeat CT scan in three months to ensure resolution of right upper lobe infiltrate - Encourage gradual increase in exercise and activity  Possible COPD Smoking history with recent cessation. Emphysema noted in past imaging. No prior COPD diagnosis, inhaler use, or exacerbations. Evaluation for COPD planned post-pneumonia recovery. Advised against resuming smoking. - Schedule lung function test in three months to evaluate for COPD - Advise against resuming smoking  Diabetes mellitus Managed with insulin  pump.  Coronary artery disease with history of stent placement Coronary artery disease with stent placement post-myocardial infarction in 2017.  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  Recommendations: PFTs CT scan in 3 months Smoking cessation  Barett Whidbee MD [facility] 05/19/2024, 11:07 AM  CC: Zorita Hiss, FNP

## 2024-05-19 NOTE — Progress Notes (Deleted)
 Alan Lopez    [id number]    06/24/1946  Primary Care Physician:Jones, Randa Burton, MD  Referring Physician: Zorita Hiss, FNP [address] [place],  [place] [address]  Chief complaint:  ***  HPI: 78 y.o. 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  Discussed the use of AI scribe software for clinical note transcription with the patient, who gave verbal consent to proceed.  History of Present Illness      Pets: Occupation: Exposures: No h/o chemo/XRT/amiodarone/macrodantin/MTX  No exposure to asbestos, silica or other organic allergens  Smoking history: Travel history: Relevant family history:   Outpatient Encounter Medications as of 05/19/2024  Medication Sig   acetaminophen  (TYLENOL ) 500 MG tablet Take 500 mg by mouth every 6 (six) hours as needed for mild pain or moderate pain.   albuterol  (VENTOLIN  HFA) 108 (90 Base) MCG/ACT inhaler Inhale 2 puffs into the lungs.   alfuzosin  (UROXATRAL ) 10 MG 24 hr tablet Take 1 tablet (10 mg total) by mouth daily with breakfast.   aspirin EC 81 MG tablet Take 81 mg by mouth daily.   cetirizine (ZYRTEC) 10 MG tablet Take 10 mg by mouth daily.   cholecalciferol (VITAMIN D) 1000 UNITS tablet Take 1,000 Units by mouth daily.   dapsone 100 MG tablet Take 100 mg by mouth daily.   finasteride  (PROSCAR ) 5 MG tablet Take 1 tablet (5 mg total) by mouth daily.   fluticasone  (FLONASE ) 50 MCG/ACT nasal spray Place 2 sprays into both nostrils daily.   Fluticasone -Salmeterol 55-14 MCG/ACT AEPB Inhale 1 puff twice a day by inhalation route.   ibuprofen  (ADVIL ) 600 MG tablet Take 1 tablet (600 mg total) by mouth every 6  (six) hours as needed.   Insulin  Disposable Pump (V-GO 20) 20 UNIT/24HR KIT 1 kit by Does not apply route daily. USE AS DIRECT TO INJECT UP TO 20 UNITS AT 6:00AM DAILY   Insulin  Pen Needle 32G X 4 MM MISC Use Three per day to inject insulin    insulin  regular (NOVOLIN R,HUMULIN R ) 100 units/mL injection Inject into the skin 3 (three) times daily before meals. USE INSULIN  TO FILL UP v-GO PUMP, use 3-5 units   JARDIANCE  25 MG TABS tablet TAKE 1 TABLET BY MOUTH DAILY   losartan  (COZAAR ) 25 MG tablet Take 1 tablet (25 mg total) by mouth daily.   Multiple Vitamin (MULTIVITAMIN WITH MINERALS) TABS tablet Take 1 tablet by mouth daily.   pioglitazone  (ACTOS ) 15 MG tablet Take 1 tablet (15 mg total) by mouth daily.   rosuvastatin  (CRESTOR ) 10 MG tablet Take 1 tablet (10 mg total) by mouth at bedtime.   No facility-administered encounter medications on file as of 05/19/2024.    Allergies as of 05/19/2024 - Review Complete 05/19/2024  Allergen Reaction Noted   Atorvastatin  Other (See Comments) 10/06/2018   Codeine Other (See Comments) 04/12/2015    Past Medical History:  Diagnosis Date   Blindness of left eye 04/12/2015   Bradycardia 01/07/2022   Beta-blocker DC'd due to bradycardia.   HTN (hypertension)    Hypertrophy (benign) of prostate    Multinodular goiter 04/12/2015   Non-insulin  dependent type 2 diabetes mellitus (HCC)    Status post non-ST elevation myocardial infarction (NSTEMI)  CATH   Tobacco abuse    Type II diabetes mellitus, uncontrolled 04/12/2015    Past Surgical History:  Procedure Laterality Date   CORONARY ANGIOPLASTY WITH STENT PLACEMENT      Family History  Problem Relation Age of Onset   Diabetes Mother    Diabetes Brother    Cancer Brother    Heart disease Neg Hx     Social History   Socioeconomic History   Marital status: Widowed    Spouse name: Not on file   Number of children: 3   Years of education: Not on file   Highest education level: Not on file   Occupational History   Occupation: retired  Tobacco Use   Smoking status: Light Smoker    Current packs/day: 0.50    Average packs/day: 0.5 packs/day for 40.0 years (20.0 ttl pk-yrs)    Types: Cigarettes    Passive exposure: Past   Smokeless tobacco: Never   Tobacco comments:    smokes on social occassions  Vaping Use   Vaping status: Never Used  Substance and Sexual Activity   Alcohol use: Yes    Alcohol/week: 10.0 standard drinks of alcohol    Types: 10 Cans of beer per week    Comment: socially   Drug use: No   Sexual activity: Never  Other Topics Concern   Not on file  Social History Narrative   Not on file   Social Drivers of Health   Financial Resource Strain: Low Risk  (03/29/2024)   Overall Financial Resource Strain (CARDIA)    Difficulty of Paying Living Expenses: Not hard at all  Food Insecurity: Low Risk  (05/04/2024)   Received from [facility]   [facility] Food Security    Within the past 12 months, the food you bought just didn't last and you didn't have money to get more.: 3    Within the past 12 months, you worried that your food would run out before you got money to buy more.: 3  Transportation Needs: Not At Risk (05/04/2024)   Received from [facility]   [facility] Transportation Needs    In the past 12 months, has lack of reliable transportation kept you from medical appointments, meetings, work or from getting things needed for daily living?: No  Physical Activity: Insufficiently Active (03/29/2024)   Exercise Vital Sign    Days of Exercise per Week: 3 days    Minutes of Exercise per Session: 30 min  Stress: No Stress Concern Present (03/29/2024)   [employer] of Occupational Health - Occupational Stress Questionnaire    Feeling of Stress : Not at all  Social Connections: Socially Isolated (03/29/2024)   Social Connection and Isolation Panel [NHANES]    Frequency of Communication with Friends and Family: More than three times a week    Frequency of Social  Gatherings with Friends and Family: Once a week    Attends Religious Services: Never    Database administrator or Organizations: No    Attends Banker Meetings: Never    Marital Status: Widowed  Intimate Partner Violence: Not At Risk (05/04/2024)   Received from [facility]   [facility] Safety    How often does anyone, including family and friends, threaten you with harm?: 1    How often does anyone, including family and friends, insult or talk down to you?: 1    How often does anyone, including family and friends, physically hurt you?: 1    How often does anyone, including family  and friends, scream or curse at you?: 1    Review of systems: Review of Systems  Constitutional: Negative for fever and chills.  HENT: Negative.   Eyes: Negative for blurred vision.  Respiratory: as per HPI  Cardiovascular: Negative for chest pain and palpitations.  Gastrointestinal: Negative for vomiting, diarrhea, blood per rectum. Genitourinary: Negative for dysuria, urgency, frequency and hematuria.  Musculoskeletal: Negative for myalgias, back pain and joint pain.  Skin: Negative for itching and rash.  Neurological: Negative for dizziness, tremors, focal weakness, seizures and loss of consciousness.  Endo/Heme/Allergies: Negative for environmental allergies.  Psychiatric/Behavioral: Negative for depression, suicidal ideas and hallucinations.  All other systems reviewed and are negative.  Physical Exam: Blood pressure (!) 167/78, pulse 62, height 6' (1.829 m), weight 212 lb (96.2 kg), SpO2 91%. Gen:      No acute distress HEENT:  EOMI, sclera anicteric Neck:     No masses; no thyromegaly Lungs:    Clear to auscultation bilaterally; normal respiratory effort*** CV:         Regular rate and rhythm; no murmurs Abd:      + bowel sounds; soft, non-tender; no palpable masses, no distension Ext:    No edema; adequate peripheral perfusion Skin:      Warm and dry; no rash Neuro: alert and oriented x  3 Psych: normal mood and affect  Data Reviewed: Imaging: 10/28/2023  [facility] 05/03/2024 IMPRESSION:  1. No evidence of pulmonary embolus.  2.  Patchy right upper lobe airspace disease that is likely  inflammatory or infectious.    PFTs:  Labs:  Assessment and Plan Assessment & Plan       Recommendations: *** This appointment required *** minutes of patient care (this includes precharting, chart review, review of results, face-to-face care, etc.).  Phyllis Breeze MD [facility] 05/19/2024, 10:54 AM  CC: Zorita Hiss, FNP

## 2024-06-14 ENCOUNTER — Other Ambulatory Visit: Payer: Self-pay | Admitting: Endocrinology

## 2024-06-14 DIAGNOSIS — Z794 Long term (current) use of insulin: Secondary | ICD-10-CM

## 2024-06-14 NOTE — Telephone Encounter (Signed)
 Refill request complete

## 2024-06-15 ENCOUNTER — Telehealth: Payer: Self-pay | Admitting: Cardiology

## 2024-06-15 MED ORDER — LOSARTAN POTASSIUM 25 MG PO TABS: 25.0000 mg | ORAL_TABLET | Freq: Every day | ORAL | 2 refills | Status: AC

## 2024-06-15 NOTE — Telephone Encounter (Signed)
 Pt's medication was sent to pt's pharmacy as requested. Confirmation received.

## 2024-06-15 NOTE — Telephone Encounter (Signed)
*  STAT* If patient is at the pharmacy, call can be transferred to refill team.   1. Which medications need to be refilled? (please list name of each medication and dose if known) losartan  (COZAAR ) 25 MG tablet   2. Which pharmacy/location (including street and city if local pharmacy) is medication to be sent to?  [facility] [id number] - [place], [place] - [address]    3. Do they need a 30 day or 90 day supply? 90

## 2024-06-29 ENCOUNTER — Ambulatory Visit: Payer: Self-pay | Admitting: Podiatry

## 2024-06-29 ENCOUNTER — Other Ambulatory Visit: Payer: Self-pay | Admitting: Endocrinology

## 2024-06-29 DIAGNOSIS — E1165 Type 2 diabetes mellitus with hyperglycemia: Secondary | ICD-10-CM

## 2024-06-30 ENCOUNTER — Other Ambulatory Visit: Payer: Medicare Other

## 2024-06-30 DIAGNOSIS — R972 Elevated prostate specific antigen [PSA]: Secondary | ICD-10-CM

## 2024-07-01 ENCOUNTER — Ambulatory Visit (INDEPENDENT_AMBULATORY_CARE_PROVIDER_SITE_OTHER)

## 2024-07-01 ENCOUNTER — Ambulatory Visit (INDEPENDENT_AMBULATORY_CARE_PROVIDER_SITE_OTHER): Payer: Self-pay | Admitting: Podiatry

## 2024-07-01 ENCOUNTER — Encounter: Payer: Self-pay | Admitting: Podiatry

## 2024-07-01 VITALS — Ht 72.0 in | Wt 212.0 lb

## 2024-07-01 DIAGNOSIS — M722 Plantar fascial fibromatosis: Secondary | ICD-10-CM | POA: Diagnosis not present

## 2024-07-01 DIAGNOSIS — M7751 Other enthesopathy of right foot: Secondary | ICD-10-CM

## 2024-07-01 LAB — PSA: Prostate Specific Ag, Serum: 8.2 ng/mL — ABNORMAL HIGH (ref 0.0–4.0)

## 2024-07-01 MED ORDER — TRIAMCINOLONE ACETONIDE 10 MG/ML IJ SUSP
10.0000 mg | Freq: Once | INTRAMUSCULAR | Status: AC
  Administered 2024-07-01: 10 mg via INTRA_ARTICULAR

## 2024-07-01 MED ORDER — DICLOFENAC SODIUM 75 MG PO TBEC: 75.0000 mg | DELAYED_RELEASE_TABLET | Freq: Two times a day (BID) | ORAL | 2 refills | Status: AC

## 2024-07-01 NOTE — Progress Notes (Signed)
 Subjective:   Patient ID: Alan Lopez, male   DOB: 78 y.o.   MRN: [id number]   HPI Patient states he has had a lot of pain in his right heel and he states that it feels like something sticking in it is also tight.  States that it has been going on for over a month and patient does not smoke likes to be active   Review of Systems  All other systems reviewed and are negative.       Objective:  Physical Exam Vitals and nursing note reviewed.  Constitutional:      Appearance: He is well-developed.  Pulmonary:     Effort: Pulmonary effort is normal.  Musculoskeletal:        General: Normal range of motion.  Skin:    General: Skin is warm.  Neurological:     Mental Status: He is alert.     Neurovascular status found to be intact muscle strength found to be adequate range of motion adequate with patient found to have exquisite discomfort in the plantar aspect right heel at the insertional point tendon calcaneus with inflammation fluid around the medial band.  Patient is noted to have good digital perfusion well-oriented and had slight discomfort of the metatarsal phalangeal joint but that is probably compensation     Assessment:  Acute plantar fasciitis right heel at the insertional point tendon calcaneus     Plan:  H&P x-ray reviewed sterile prep injected the fascia at insertion 3 mg Kenalog  5 mg Xylocaine  applied fascial brace properly fitted into the arch to lift up the arch with all instructions on usage and advised this patient on shoe gear modifications.  Reappoint 2 weeks  X-rays indicate spur no indication stress fracture arthritis right or forefoot pathology

## 2024-07-01 NOTE — Patient Instructions (Signed)

## 2024-07-07 ENCOUNTER — Ambulatory Visit: Payer: Medicare Other | Admitting: Urology

## 2024-07-08 ENCOUNTER — Ambulatory Visit: Payer: Self-pay | Admitting: Urology

## 2024-07-15 ENCOUNTER — Encounter: Payer: Self-pay | Admitting: Podiatry

## 2024-07-15 ENCOUNTER — Ambulatory Visit (INDEPENDENT_AMBULATORY_CARE_PROVIDER_SITE_OTHER): Admitting: Podiatry

## 2024-07-15 DIAGNOSIS — M722 Plantar fascial fibromatosis: Secondary | ICD-10-CM | POA: Diagnosis not present

## 2024-07-15 NOTE — Progress Notes (Signed)
 Subjective:   Patient ID: Alan Lopez, male   DOB: 78 y.o.   MRN: [id number]   HPI Patient states heel is feeling quite a bit better forefoot doing well and states he is walking with a better gait pattern   ROS      Objective:  Physical Exam  Neurovascular status intact muscle strength found to be adequate mild discomfort at the plantar fascia with deep palpation with forefoot pain which has resolved currently     Assessment:  Acute plantar fasciitis right improved with treatment     Plan:  H&P reviewed at great length and he also mentioned hip pain which we discussed.  We discussed his anti-inflammatories and I am going to have him get a refill of this for the time being and I also discussed exercises to do and shoe gear modifications.  Patient is at this time discharged but all instructions given for this particular condition

## 2024-07-18 ENCOUNTER — Other Ambulatory Visit: Payer: Self-pay | Admitting: Urology

## 2024-07-27 ENCOUNTER — Ambulatory Visit: Admitting: Endocrinology

## 2024-08-05 ENCOUNTER — Ambulatory Visit
Admission: RE | Admit: 2024-08-05 | Discharge: 2024-08-05 | Disposition: A | Discharge status: Home / Self Care | Source: Ambulatory Visit | Attending: Pulmonary Disease | Admitting: Pulmonary Disease

## 2024-08-05 DIAGNOSIS — J439 Emphysema, unspecified: Secondary | ICD-10-CM

## 2024-08-17 ENCOUNTER — Ambulatory Visit: Payer: Self-pay | Admitting: Endocrinology

## 2024-08-17 ENCOUNTER — Ambulatory Visit: Payer: Self-pay | Admitting: Pulmonary Disease

## 2024-08-17 ENCOUNTER — Encounter: Payer: Self-pay | Admitting: Endocrinology

## 2024-08-17 ENCOUNTER — Ambulatory Visit (INDEPENDENT_AMBULATORY_CARE_PROVIDER_SITE_OTHER): Admitting: Endocrinology

## 2024-08-17 VITALS — BP 140/62 | HR 54 | Resp 20 | Ht 72.0 in | Wt 215.8 lb

## 2024-08-17 DIAGNOSIS — Z794 Long term (current) use of insulin: Secondary | ICD-10-CM

## 2024-08-17 DIAGNOSIS — E1165 Type 2 diabetes mellitus with hyperglycemia: Secondary | ICD-10-CM

## 2024-08-17 LAB — POCT GLYCOSYLATED HEMOGLOBIN (HGB A1C): Hemoglobin A1C: 5.2 % (ref 4.0–5.6)

## 2024-08-17 NOTE — Progress Notes (Unsigned)
 Outpatient Endocrinology Note Alan Twain Stenseth, MD  08/19/24  Patient's Name: Alan Lopez    DOB: [birth year]    MRN: [id number]                                                    REASON OF VISIT: Follow up of type 2 diabetes mellitus  PCP: Joshua Debby CROME, MD  HISTORY OF PRESENT ILLNESS:   Alan Lopez is a 78 y.o. old male with past medical history listed below, is here for follow up for type 2 diabetes mellitus.   Pertinent Diabetes History: Patient was diagnosed with type 2 diabetes mellitus in 1999.  He was started on V-Go pump in July 2018 because of inconsistent control and poor compliance with mealtime insulin .  Chronic Diabetes Complications : Retinopathy: no. Last ophthalmology exam was done on annually, following with ophthalmology regularly. 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  Nephropathy: no, on ACE/ARB /losartan . Peripheral neuropathy: yes numbness and tingling of the sole. Coronary artery disease: yes Stroke: no  Relevant comorbidities and cardiovascular risk factors: Obesity: no Body mass index is 29.27 kg/m.  Hypertension: Yes  Hyperlipidemia : Yes, on statin   Current / Home Diabetic regimen includes:  Actos  15 mg daily.  Jardiance  25 mg daily. Current INSULIN  regimen: V-go pump SETTINGS: Basal rate = 20 units BOLUSES = 6-8 units at breakfast, 6-8 units at lunch and  6-8 units at dinner   INSULIN  type: Regular insulin   Prior diabetic medications: Glipizide , metformin  in the past.  Diarrhea with metformin .  Glycemic data:    [employer] Contour glucometer, download from August 26 to September 9 , 2025, average blood sugar 145.  Checking mostly in the morning and some time at bedtime.  Fasting blood sugar 113, 119, 126, 159.  Blood sugar at bedtime 112, 136.  One of the blood sugar was 247.   CGM/Dexcom was discussed in the past.  Patient reports that he had adhesive problem in the abdomen.  Hypoglycemia: Patient has no hypoglycemic  episodes. Patient has hypoglycemia awareness.  Factors modifying glucose control: 1.  Diabetic diet assessment: 3 meals a day.  He tried to limit high carb meals.  2.  Staying active or exercising:   3.  Medication compliance: compliant all of the time.  THYROID : Previously was found to have a 2 cm nodule on his left thyroid  on his exam but ultrasound in 04/2015 showed multinodular goiter with mostly small nodules; the largest nodule on the left side of 1.4 cm has the appearance of a pseudo- nodule   Thyroid  levels have been normal consistently.  Interval history  Glucometer data as reviewed above.  Mostly acceptable blood sugar.  Denies hypoglycemic symptoms.  Diabetes regimen as reviewed above.  He has no longer been taking Actos  for few months.  Hemoglobin A1c 5.2% today.  He had normal TSH of 2.63 in June.  No other complaints today.  REVIEW OF SYSTEMS As per history of present illness.   PAST MEDICAL HISTORY: Past Medical History:  Diagnosis Date   Blindness of left eye 04/12/2015   Bradycardia 01/07/2022   Beta-blocker DC'd due to bradycardia.   HTN (hypertension)    Hypertrophy (benign) of prostate    Multinodular goiter 04/12/2015   Non-insulin  dependent type 2 diabetes mellitus (HCC)    Status post non-ST elevation myocardial  infarction (NSTEMI)    CATH   Tobacco abuse    Type II diabetes mellitus, uncontrolled 04/12/2015    PAST SURGICAL HISTORY: Past Surgical History:  Procedure Laterality Date   CORONARY ANGIOPLASTY WITH STENT PLACEMENT      ALLERGIES: Allergies  Allergen Reactions   Atorvastatin  Other (See Comments)   Codeine Other (See Comments)    Patient doesn't like how it makes him feel, spaced out, not together  Doesn't like the way it makes him feel    FAMILY HISTORY:  Family History  Problem Relation Age of Onset   Diabetes Mother    Diabetes Brother    Cancer Brother    Heart disease Neg Hx     SOCIAL HISTORY: Social History    Socioeconomic History   Marital status: Widowed    Spouse name: Not on file   Number of children: 3   Years of education: Not on file   Highest education level: Not on file  Occupational History   Occupation: retired  Tobacco Use   Smoking status: Light Smoker    Current packs/day: 0.50    Average packs/day: 0.5 packs/day for 40.0 years (20.0 ttl pk-yrs)    Types: Cigarettes    Passive exposure: Past   Smokeless tobacco: Never   Tobacco comments:    smokes on social occassions  Vaping Use   Vaping status: Never Used  Substance and Sexual Activity   Alcohol use: Yes    Alcohol/week: 10.0 standard drinks of alcohol    Types: 10 Cans of beer per week    Comment: socially   Drug use: No   Sexual activity: Never  Other Topics Concern   Not on file  Social History Narrative   Not on file   Social Drivers of Health   Financial Resource Strain: Low Risk  (03/29/2024)   Overall Financial Resource Strain (CARDIA)    Difficulty of Paying Living Expenses: Not hard at all  Food Insecurity: Low Risk  (05/04/2024)   Received from [facility]   [facility] Food Security    Within the past 12 months, the food you bought just didn't last and you didn't have money to get more.: 3    Within the past 12 months, you worried that your food would run out before you got money to buy more.: 3  Transportation Needs: Not At Risk (05/04/2024)   Received from [facility]   [facility] Transportation Needs    In the past 12 months, has lack of reliable transportation kept you from medical appointments, meetings, work or from getting things needed for daily living?: No  Physical Activity: Insufficiently Active (03/29/2024)   Exercise Vital Sign    Days of Exercise per Week: 3 days    Minutes of Exercise per Session: 30 min  Stress: No Stress Concern Present (03/29/2024)   [employer] of Occupational Health - Occupational Stress Questionnaire    Feeling of Stress : Not at all  Social Connections: Socially  Isolated (03/29/2024)   Social Connection and Isolation Panel    Frequency of Communication with Friends and Family: More than three times a week    Frequency of Social Gatherings with Friends and Family: Once a week    Attends Religious Services: Never    Database administrator or Organizations: No    Attends Banker Meetings: Never    Marital Status: Widowed    MEDICATIONS:  Current Outpatient Medications  Medication Sig Dispense Refill   acetaminophen  (TYLENOL ) 500 MG  tablet Take 500 mg by mouth every 6 (six) hours as needed for mild pain or moderate pain.     alfuzosin  (UROXATRAL ) 10 MG 24 hr tablet Take 1 tablet (10 mg total) by mouth daily with breakfast. 90 tablet 3   aspirin EC 81 MG tablet Take 81 mg by mouth daily.     cetirizine (ZYRTEC) 10 MG tablet Take 10 mg by mouth daily.     cholecalciferol (VITAMIN D) 1000 UNITS tablet Take 1,000 Units by mouth daily.     dapsone 100 MG tablet Take 100 mg by mouth daily.     diclofenac  (VOLTAREN ) 75 MG EC tablet Take 1 tablet (75 mg total) by mouth 2 (two) times daily. 50 tablet 2   finasteride  (PROSCAR ) 5 MG tablet TAKE 1 TABLET BY MOUTH DAILY 90 tablet 3   fluticasone  (FLONASE ) 50 MCG/ACT nasal spray Place 2 sprays into both nostrils daily. 16 g 0   ibuprofen  (ADVIL ) 600 MG tablet Take 1 tablet (600 mg total) by mouth every 6 (six) hours as needed. 30 tablet 0   Insulin  Disposable Pump (V-GO 20) 20 UNIT/24HR KIT 1 kit by Does not apply route daily. USE AS DIRECT TO INJECT UP TO 20 UNITS AT 6:00AM DAILY 30 kit 11   Insulin  Pen Needle 32G X 4 MM MISC Use Three per day to inject insulin  100 each 1   insulin  regular (NOVOLIN R,HUMULIN R ) 100 units/mL injection Inject into the skin 3 (three) times daily before meals. USE INSULIN  TO FILL UP v-GO PUMP, use 3-5 units     JARDIANCE  25 MG TABS tablet TAKE 1 TABLET BY MOUTH DAILY 30 tablet 3   losartan  (COZAAR ) 25 MG tablet Take 1 tablet (25 mg total) by mouth daily. 90 tablet 2    Multiple Vitamin (MULTIVITAMIN WITH MINERALS) TABS tablet Take 1 tablet by mouth daily.     pioglitazone  (ACTOS ) 15 MG tablet TAKE 1 TABLET BY MOUTH DAILY 90 tablet 0   rosuvastatin  (CRESTOR ) 10 MG tablet Take 1 tablet (10 mg total) by mouth at bedtime. 90 tablet 3   No current facility-administered medications for this visit.    PHYSICAL EXAM: Vitals:   08/17/24 1043 08/17/24 1047  BP: (!) 142/62 (!) 140/62  Pulse: (!) 54   Resp: 20   SpO2: 94%   Weight: 215 lb 12.8 oz (97.9 kg)   Height: 6' (1.829 m)     Body mass index is 29.27 kg/m.  Wt Readings from Last 3 Encounters:  08/17/24 215 lb 12.8 oz (97.9 kg)  07/01/24 212 lb (96.2 kg)  05/19/24 212 lb (96.2 kg)    General: Well developed, well nourished male in no apparent distress.  HEENT: AT/[place], no external lesions.  Eyes: Conjunctiva clear and no icterus. Neck: Neck supple  Lungs: Respirations not labored Neurologic: Alert, oriented, normal speech Extremities / Skin: Dry.   Psychiatric: Does not appear depressed or anxious  Diabetic Foot Exam - Simple   No data filed    LABS Reviewed Lab Results  Component Value Date   HGBA1C 5.2 08/17/2024   HGBA1C 5.1 04/05/2024   HGBA1C 5.9 (H) 12/31/2023   Lab Results  Component Value Date   FRUCTOSAMINE 225 05/01/2020   FRUCTOSAMINE 294 (H) 06/30/2017   FRUCTOSAMINE 255 09/09/2016   Lab Results  Component Value Date   CHOL 128 10/01/2023   HDL 67.20 10/01/2023   LDLCALC 36 10/01/2023   TRIG 120.0 10/01/2023   CHOLHDL 2 10/01/2023   Lab Results  Component Value Date   MICRALBCREAT 4 08/17/2024    Lab Results  Component Value Date   CREATININE 0.77 04/05/2024   Lab Results  Component Value Date   GFR 83.45 10/01/2023    ASSESSMENT / PLAN  1. Type 2 diabetes mellitus with hyperglycemia, with long-term current use of insulin  (HCC)     Diabetes Mellitus type 2, complicated by CAD/peripheral neuropathy. - Diabetic status / severity: Controlled  Lab  Results  Component Value Date   HGBA1C 5.2 08/17/2024    - Hemoglobin A1c goal : <7%  Hemoglobin A1c 5.2%.  Discussed about concern of possible hypoglycemia.  Not recorded on glucometer.  Asked to monitor blood sugar more often especially when he has hypoglycemic symptoms.  Discussed about continuous glucose monitoring, patient wants to think about it he declined at this time.  - Medications: See below.  No change.  Actos  15 mg daily. Jardiance  25 mg daily. Current INSULIN  regimen: V-go pump SETTINGS: Basal rate = 20 units. Continue BOLUSES = 6-8 units at breakfast, 6-8 units at lunch and  6-8 units at dinner   INSULIN  type: Regular insulin   - Home glucose testing: Before meals and at bedtime. Encouraged to use CGM. - Discussed/ Gave Hypoglycemia treatment plan.  # Consult : not required at this time.   # Annual urine for microalbuminuria/ creatinine ratio, no microalbuminuria currently, continue ACE/ARB /losartan .  Will check today. Last  Lab Results  Component Value Date   MICRALBCREAT 4 08/17/2024     # Foot check nightly / neuropathy.  # Annual dilated diabetic eye exams.   - Diet: Make healthy diabetic food choices - Life style / activity / exercise: Discussed.  2. Blood pressure  -  BP Readings from Last 1 Encounters:  08/17/24 (!) 140/62    - Control is in target.  Acceptable blood pressure. - No change in current plans.  3. Lipid status / Hyperlipidemia - Last  Lab Results  Component Value Date   LDLCALC 36 10/01/2023   - Continue rosuvastatin  20 mg daily.  Managed by PCP.  Diagnoses and all orders for this visit:  Type 2 diabetes mellitus with hyperglycemia, with long-term current use of insulin  (HCC) -     POCT glycosylated hemoglobin (Hb A1C) -     Microalbumin / creatinine urine ratio    DISPOSITION Follow up in clinic in 4 months suggested.   All questions answered and patient verbalized understanding of the plan.  Alan Kalijah Westfall,  MD [facility] [address] [place], [address] [place], [place] [address] Phone # [phone]  At least part of this note was generated using voice recognition software. Inadvertent word errors may have occurred, which were not recognized during the proofreading process.

## 2024-08-18 LAB — MICROALBUMIN / CREATININE URINE RATIO
Creatinine, Urine: 50 mg/dL (ref 20–320)
Microalb Creat Ratio: 4 mg/g{creat} (ref <30)
Microalb, Ur: 0.2 mg/dL

## 2024-08-19 ENCOUNTER — Ambulatory Visit: Admitting: Pulmonary Disease

## 2024-08-19 DIAGNOSIS — J439 Emphysema, unspecified: Secondary | ICD-10-CM | POA: Diagnosis not present

## 2024-08-19 LAB — PULMONARY FUNCTION TEST
DL/VA % pred: 68 %
DL/VA: 2.67 ml/min/mmHg/L
DLCO cor % pred: 57 %
DLCO cor: 14.53 ml/min/mmHg
DLCO unc % pred: 57 %
DLCO unc: 14.53 ml/min/mmHg
FEF 25-75 Post: 1.99 L/s
FEF 25-75 Pre: 1.95 L/s
FEF2575-%Change-Post: 2 %
FEF2575-%Pred-Post: 94 %
FEF2575-%Pred-Pre: 92 %
FEV1-%Change-Post: 0 %
FEV1-%Pred-Post: 88 %
FEV1-%Pred-Pre: 87 %
FEV1-Post: 2.68 L
FEV1-Pre: 2.66 L
FEV1FVC-%Change-Post: 0 %
FEV1FVC-%Pred-Pre: 102 %
FEV6-%Change-Post: 0 %
FEV6-%Pred-Post: 90 %
FEV6-%Pred-Pre: 90 %
FEV6-Post: 3.6 L
FEV6-Pre: 3.58 L
FEV6FVC-%Change-Post: 0 %
FEV6FVC-%Pred-Post: 105 %
FEV6FVC-%Pred-Pre: 105 %
FVC-%Change-Post: 1 %
FVC-%Pred-Post: 86 %
FVC-%Pred-Pre: 85 %
FVC-Post: 3.67 L
FVC-Pre: 3.61 L
Post FEV1/FVC ratio: 73 %
Post FEV6/FVC ratio: 99 %
Pre FEV1/FVC ratio: 74 %
Pre FEV6/FVC Ratio: 99 %
RV % pred: 73 %
RV: 1.97 L
TLC % pred: 77 %
TLC: 5.62 L

## 2024-08-19 NOTE — Progress Notes (Signed)
 Full PFT performed today.

## 2024-08-19 NOTE — Patient Instructions (Addendum)
 Full PFT performed today.

## 2024-08-25 ENCOUNTER — Ambulatory Visit (INDEPENDENT_AMBULATORY_CARE_PROVIDER_SITE_OTHER): Admitting: Pulmonary Disease

## 2024-08-25 ENCOUNTER — Encounter: Payer: Self-pay | Admitting: Pulmonary Disease

## 2024-08-25 VITALS — BP 130/84 | HR 54 | Temp 98.0°F | Ht 71.0 in | Wt 211.0 lb

## 2024-08-25 DIAGNOSIS — J189 Pneumonia, unspecified organism: Secondary | ICD-10-CM

## 2024-08-25 DIAGNOSIS — J8410 Pulmonary fibrosis, unspecified: Secondary | ICD-10-CM

## 2024-08-25 DIAGNOSIS — J849 Interstitial pulmonary disease, unspecified: Secondary | ICD-10-CM

## 2024-08-25 DIAGNOSIS — F17211 Nicotine dependence, cigarettes, in remission: Secondary | ICD-10-CM

## 2024-08-25 DIAGNOSIS — L920 Granuloma annulare: Secondary | ICD-10-CM | POA: Diagnosis not present

## 2024-08-25 NOTE — Patient Instructions (Signed)
  VISIT SUMMARY: You came in today for a follow-up on your lung scarring. We discussed your history of pneumonia, your recent smoking cessation, and your overall lung function. We also reviewed your past treatments and exposures to better understand the cause of your lung scarring.  YOUR PLAN: -PULMONARY FIBROSIS: Pulmonary fibrosis is a condition where the lung tissue becomes scarred and stiff, making it difficult to breathe. Your CT scan shows scarring in both lungs, which is affecting your lung capacity and oxygen levels. We will conduct blood tests to check for autoimmune diseases and provide you with a questionnaire about possible exposures. Further tests may be needed based on these results.  -TOBACCO USE DISORDER: Tobacco use disorder is a condition where a person is dependent on tobacco. You have a long history of smoking but have recently quit. Continuing to stay off cigarettes is crucial to prevent further lung damage. Keep up the good work and stay smoke-free.  -AUTOIMMUNE SKIN DISEASE: Autoimmune skin disease occurs when your immune system mistakenly attacks your skin. You were previously treated with Dapsone for this condition. If your skin symptoms return, you should resume taking Dapsone as advised by your dermatologist.  -HISTORY OF PNEUMONIA: Pneumonia is an infection that inflames the air sacs in one or both lungs. Your pneumonia has resolved, but it may have contributed to the lung scarring seen on your CT scan.  INSTRUCTIONS: Please complete the exposure questionnaire and return it to us . We will also need you to get the blood tests done to check for autoimmune diseases. Follow up with us  if you experience any worsening of your symptoms or if you have any flare-ups of your skin condition.

## 2024-08-25 NOTE — Progress Notes (Signed)
 Alan Lopez    [id number]    11/19/1946  Primary Care Physician:Jones, Debby CROME, MD  Referring Physician: Joshua Debby CROME, MD [address] [place],  [place] [address]  Chief complaint: Follow-up for COPD evaluation, recent hospitalization for pneumonia  HPI: 78 y.o. 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  Discussed the use of AI scribe software for clinical note transcription with the patient, who gave verbal consent to proceed.  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 He was referred by Dr. Joshua for evaluation of emphysema.  He was recently hospitalized in [place]  in May 2025 for right upper lobe pneumonia and received IV antibiotics, including ceftriaxone and azithromycin . Upon discharge, he was prescribed Augmentin  and azithromycin . During his hospital stay, he was started on oxygen therapy and was discharged on 1-2 liters of oxygen. At home, his oxygen saturation levels range from 86 to 89% without supplemental oxygen.  He has a history of coronary artery disease with a stent placed after a myocardial infarction in 2017 and diabetes managed with an insulin  pump. He quit smoking last month after a 30-year history of smoking half a pack per day. He has not used inhalers before, although he was given a rescue inhaler recently which he has not yet used. He has been on prednisone  in the past for flu-related chest congestion.  He has a history of tuberculoid leprosy, for which he has been treated with Dapsone for about a year. A  dermatologist has tried several treatments including salves.  No current use of inhalers. Oxygen levels drop to mid-80s during physical activity, especially in hot weather.  Interim history: Alan Lopez is a 79 year old male who presents for follow-up of lung scarring.  Dyspnea and pulmonary function - Initial hospitalization for acute hypoxemia with oxygen saturation dropping to 74% while in [place], associated with fever, myalgias, and malaise - Required supplemental oxygen for 30 days post-hospitalization, discontinued thereafter - Currently able to perform yard work for three to four hours unless in extreme heat - No significant ongoing dyspnea, but reduced exercise tolerance compared to baseline prior to illness - No current use of supplemental oxygen  Pulmonary parenchymal abnormalities - History of pneumonia with subsequent lung scarring identified on CT imaging - Reduction in lung capacity and impaired oxygen transfer attributed to pulmonary fibrosis - No evidence of COPD on recent pulmonary function testing despite prior diagnosis of emphysema  Dermatologic and musculoskeletal history - 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 Exposures: No mold, hot tub, Jacuzzi.  No feather pillows or comforters No h/o chemo/XRT/amiodarone/macrodantin/MTX  No exposure to asbestos, silica or other organic allergens  Smoking history: Over 40-year history of tobacco use, recently quit September 2025 Travel history: From [place] .He recently visited [place]  to see his sister-in-law, who is in poor health, and stayed longer than intended due to his hospitalization. Relevant family  history:Family  history includes a great uncle with asthma and a daughter with asthma. His wife, who was a COPD patient, died of ovarian cancer.    Outpatient Encounter Medications as of 08/25/2024  Medication Sig   acetaminophen  (TYLENOL ) 500 MG tablet Take 500 mg by mouth every 6 (six) hours as needed for mild pain or moderate pain.   alfuzosin  (UROXATRAL ) 10 MG 24 hr tablet Take 1 tablet (10 mg total) by mouth daily with breakfast.   aspirin EC 81 MG tablet Take 81 mg by mouth daily.   cetirizine (ZYRTEC) 10 MG tablet Take 10 mg by mouth daily.   cholecalciferol (VITAMIN D) 1000 UNITS tablet Take 1,000 Units by mouth daily.   dapsone 100 MG tablet Take 100 mg by mouth daily.   finasteride  (PROSCAR ) 5 MG tablet TAKE 1 TABLET BY MOUTH DAILY   fluticasone  (FLONASE ) 50 MCG/ACT nasal spray Place 2 sprays into both nostrils daily.   ibuprofen  (ADVIL ) 600 MG tablet Take 1 tablet (600 mg total) by mouth every 6 (six) hours as needed.   Insulin  Disposable Pump (V-GO 20) 20 UNIT/24HR KIT 1 kit by Does not apply route daily. USE AS DIRECT TO INJECT UP TO 20 UNITS AT 6:00AM DAILY   Insulin  Pen Needle 32G X 4 MM MISC Use Three per day to inject insulin    insulin  regular (NOVOLIN R,HUMULIN R ) 100 units/mL injection Inject into the skin 3 (three) times daily before meals. USE INSULIN  TO FILL UP v-GO PUMP, use 3-5 units   JARDIANCE  25 MG TABS tablet TAKE 1 TABLET BY MOUTH DAILY   losartan  (COZAAR ) 25 MG tablet Take 1 tablet (25 mg total) by mouth daily.   Multiple Vitamin (MULTIVITAMIN WITH MINERALS) TABS tablet Take 1 tablet by mouth daily.   pioglitazone  (ACTOS ) 15 MG tablet TAKE 1 TABLET BY MOUTH DAILY   rosuvastatin  (CRESTOR ) 10 MG tablet Take 1 tablet (10 mg total) by mouth at bedtime.   diclofenac  (VOLTAREN ) 75 MG EC tablet Take 1 tablet (75 mg total) by mouth 2 (two) times daily.   No facility-administered encounter medications on file as of 08/25/2024.   Vitals:   08/25/24 1132  BP: 130/84   Pulse: (!) 54  Temp: 98 F (36.7 C)  Height: 5' 11 (1.803 m)  Weight: 211 lb (95.7 kg)  SpO2: 93%  TempSrc: Oral  BMI (Calculated): 29.44     Physical Exam GEN: No acute distress CV: Regular rate and rhythm no murmurs LUNGS: Clear to auscultation bilaterally normal respiratory effort SKIN JOINTS: Warm and dry no rash    Data Reviewed: Imaging: Chest x-ray 10/28/2023-hyperinflation, mild parenchymal scarring.     CTA [facility] 05/03/2024 IMPRESSION:  1. No evidence of pulmonary embolus.  2.  Patchy right upper lobe airspace disease that is likely  inflammatory or infectious.   CT chest 07/16/2024-emphysema, bilateral upper lobe nonspecific scarring, traction bronchiectasis.  Alternate I reviewed the images personally.  PFTs: 08/19/2024 FVC 3.67 [86%], FEV1 2.68 [88%], F/F73, TLC 5.62 [77%], DLCO 14.53 [57%] Mild restriction, moderate diffusion defect  Labs:  Assessment & Plan Community-acquired pneumonia Recent hospitalization for right upper lobe pneumonia, treated with IV antibiotics and discharged on oral antibiotics. Negative for COVID-19, influenza, Legionella, Streptococcus pneumoniae, and Mycoplasma. Blood cultures were negative. Currently recovering with improved oxygen levels. - Stop oxygen therapy unless oxygen levels drop below 88% - Schedule lung function test in three months - Repeat CT scan in three months to ensure resolution of right upper lobe infiltrate - Encourage gradual  increase in exercise and activity  Possible COPD Smoking history with recent cessation. Emphysema noted in past imaging. No prior COPD diagnosis, inhaler use, or exacerbations. Evaluation for COPD planned post-pneumonia recovery. Advised against resuming smoking. - Schedule lung function test in three months to evaluate for COPD - Advise against resuming smoking  Diabetes mellitus Managed with insulin  pump.  Coronary artery disease with history of stent placement Coronary  artery disease with stent placement post-myocardial infarction in 2017.  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   Pulmonary fibrosis CT scan reveals bilateral lung scarring, predominantly in the upper lobes. Scarring is not solely due to previous pneumonia which was in the right upper lobe. Differential diagnosis includes autoimmune processes, occupational exposures, and smoking history. Lung function tests indicate reduced lung capacity and impaired oxygen diffusion, likely secondary to scarring. Further information is needed to determine etiology. - Order blood tests [connective tissue disease serologies] to evaluate for autoimmune processes - Provide exposure questionnaire for completion and return - Consider further investigation if tests return abnormal or condition worsens  Tobacco use disorder Over 40-year smoking history, recently reduced to 4-5 cigarettes per day and quit 7 days ago. Smoking may contribute to lung scarring, particularly in the upper lobes. 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 Previously treated with Dapsone, currently not taking it. Dermatologist advised to resume if symptoms flare. Potential link to lung scarring being considered, though not confirmed. - Monitor for flare-ups of skin disease and resume Dapsone if necessary  History of pneumonia Pneumonia resolved as confirmed by CT scan. Initial hospitalization due to low oxygen saturation and symptoms of fever, aches, and pains.  Recommendations: Smoking cessation Connective tissue disease serologies ILD questionnaire  Lonna Coder MD [facility] 08/25/2024, 11:38 AM  CC: Joshua Debby CROME, MD

## 2024-08-27 LAB — ANA,IFA RA DIAG PNL W/RFLX TIT/PATN
Anti Nuclear Antibody (ANA): NEGATIVE
Cyclic Citrullin Peptide Ab: <16 U
Rheumatoid fact SerPl-aCnc: <10 [IU]/mL (ref <14)

## 2024-08-30 LAB — HYPERSENSITIVITY PNEUMONITIS
A. Pullulans Abs: NEGATIVE
A.Fumigatus #1 Abs: NEGATIVE
Micropolyspora faeni, IgG: NEGATIVE
Pigeon Serum Abs: NEGATIVE
Thermoact. Saccharii: NEGATIVE
Thermoactinomyces vulgaris, IgG: NEGATIVE

## 2024-09-03 ENCOUNTER — Ambulatory Visit: Payer: Self-pay | Admitting: Pulmonary Disease

## 2024-09-11 ENCOUNTER — Other Ambulatory Visit: Payer: Self-pay | Admitting: Urology

## 2024-09-13 ENCOUNTER — Other Ambulatory Visit: Payer: Self-pay

## 2024-09-14 MED ORDER — ALFUZOSIN HCL ER 10 MG PO TB24: 10.0000 mg | ORAL_TABLET | Freq: Every day | ORAL | 3 refills | Status: DC

## 2024-09-28 ENCOUNTER — Other Ambulatory Visit: Payer: Self-pay

## 2024-09-28 DIAGNOSIS — E1165 Type 2 diabetes mellitus with hyperglycemia: Secondary | ICD-10-CM

## 2024-09-28 MED ORDER — PIOGLITAZONE HCL 15 MG PO TABS: 15.0000 mg | ORAL_TABLET | Freq: Every day | ORAL | 0 refills | Status: DC

## 2024-10-06 ENCOUNTER — Telehealth (HOSPITAL_BASED_OUTPATIENT_CLINIC_OR_DEPARTMENT_OTHER): Payer: Self-pay

## 2024-10-06 ENCOUNTER — Telehealth: Payer: Self-pay

## 2024-10-06 NOTE — Telephone Encounter (Signed)
 Preop tele appt now scheduled, med rec and consent done.

## 2024-10-06 NOTE — Telephone Encounter (Signed)
   Pre-operative Risk Assessment    Patient Name: Alan Lopez  DOB: 05/28/1946 MRN: [id number]   Date of last office visit: 04/07/24 with Dr. Jeffrie Date of next office visit: NA   Request for Surgical Clearance    Procedure:  Right Ring Finger A1 Pulley Release  Date of Surgery:  Clearance TBD                                 Surgeon:  Dr. Bebe Galla Surgeon's Group or Practice Name:  [facility] Phone number:  [phone] Fax number:  [phone]   Type of Clearance Requested:   - Medical  - Pharmacy:  Hold Aspirin not indicated   Type of Anesthesia:  Local + MAC   Additional requests/questions:    Bonney Augustin JONETTA Delores   10/06/2024, 11:38 AM

## 2024-10-06 NOTE — Telephone Encounter (Signed)
  Patient Consent for Virtual Visit        Alan Lopez has provided verbal consent on 10/06/2024 for a virtual visit (video or telephone).   CONSENT FOR VIRTUAL VISIT FOR:  Alan Lopez  By participating in this virtual visit I agree to the following:  I hereby voluntarily request, consent and authorize [facility] and its employed or contracted physicians, physician assistants, nurse practitioners or other licensed health care professionals (the Practitioner), to provide me with telemedicine health care services (the "Services) as deemed necessary by the treating Practitioner. I acknowledge and consent to receive the Services by the Practitioner via telemedicine. I understand that the telemedicine visit will involve communicating with the Practitioner through live audiovisual communication technology and the disclosure of certain medical information by electronic transmission. I acknowledge that I have been given the opportunity to request an in-person assessment or other available alternative prior to the telemedicine visit and am voluntarily participating in the telemedicine visit.  I understand that I have the right to withhold or withdraw my consent to the use of telemedicine in the course of my care at any time, without affecting my right to future care or treatment, and that the Practitioner or I may terminate the telemedicine visit at any time. I understand that I have the right to inspect all information obtained and/or recorded in the course of the telemedicine visit and may receive copies of available information for a reasonable fee.  I understand that some of the potential risks of receiving the Services via telemedicine include:  Delay or interruption in medical evaluation due to technological equipment failure or disruption; Information transmitted may not be sufficient (e.g. poor resolution of images) to allow for appropriate medical decision making by the  Practitioner; and/or  In rare instances, security protocols could fail, causing a breach of personal health information.  Furthermore, I acknowledge that it is my responsibility to provide information about my medical history, conditions and care that is complete and accurate to the best of my ability. I acknowledge that Practitioner's advice, recommendations, and/or decision may be based on factors not within their control, such as incomplete or inaccurate data provided by me or distortions of diagnostic images or specimens that may result from electronic transmissions. I understand that the practice of medicine is not an exact science and that Practitioner makes no warranties or guarantees regarding treatment outcomes. I acknowledge that a copy of this consent can be made available to me via my patient portal [facility] MyChart), or I can request a printed copy by calling the office of [facility].    I understand that my insurance will be billed for this visit.   I have read or had this consent read to me. I understand the contents of this consent, which adequately explains the benefits and risks of the Services being provided via telemedicine.  I have been provided ample opportunity to ask questions regarding this consent and the Services and have had my questions answered to my satisfaction. I give my informed consent for the services to be provided through the use of telemedicine in my medical care

## 2024-10-06 NOTE — Telephone Encounter (Signed)
   Name: Alan Lopez  DOB: 08-05-1946  MRN: [id number]  Primary Cardiologist: Oneil Parchment, MD   Preoperative team, please contact this patient and set up a phone call appointment for further preoperative risk assessment. Please obtain consent and complete medication review. Thank you for your help.  Per office protocol, if patient is without any new symptoms or concerns at the time of their virtual visit, he may hold ASA for 7 days prior to procedure. Please resume ASA as soon as possible postprocedure, at the discretion of the surgeon.    I confirm that guidance regarding antiplatelet and oral anticoagulation therapy has been completed and, if necessary, noted below.  I also confirmed the patient resides in the state of [place] . As per [facility] Medical Board telemedicine laws, the patient must reside in the state in which the provider is licensed.   [name], NP 10/06/2024, 11:59 AM [facility]

## 2024-10-11 ENCOUNTER — Other Ambulatory Visit: Payer: Self-pay | Admitting: Endocrinology

## 2024-10-11 DIAGNOSIS — Z794 Long term (current) use of insulin: Secondary | ICD-10-CM

## 2024-10-15 ENCOUNTER — Ambulatory Visit: Attending: Cardiology | Admitting: Cardiology

## 2024-10-15 DIAGNOSIS — Z01818 Encounter for other preprocedural examination: Secondary | ICD-10-CM

## 2024-10-15 NOTE — Progress Notes (Signed)
 Virtual Visit via Telephone Note   Because of [name] co-morbid illnesses, he is at least at moderate risk for complications without adequate follow up.  This format is felt to be most appropriate for this patient at this time.  Due to technical limitations with video connection (technology), today's appointment will be conducted as an audio only telehealth visit, and Alan Lopez verbally agreed to proceed in this manner.   All issues noted in this document were discussed and addressed.  No physical exam could be performed with this format.  Evaluation Performed:  Preoperative cardiovascular risk assessment _____________   Date:  10/15/2024   Patient ID:  Alan Lopez, DOB [birth year], MRN [id number] Patient Location:  Home Provider location:   Office  Primary Care Provider:  Joshua Debby CROME, MD Primary Cardiologist:  Oneil Parchment, MD  Chief Complaint / Patient Profile   78 y.o. y/o male with a h/o CAD s/p DES to RCA in the setting of a nSTEMI, bradycardia, dyslipidemia,  DM2 who is pending Right Ring Finger A1 Pulley Release and presents today for telephonic preoperative cardiovascular risk assessment.  History of Present Illness    Alan Lopez is a 78 y.o. male who presents via audio conferencing for a telehealth visit today.  Pt was last seen in cardiology clinic on 04/07/2024 by Dr. Parchment.  At that time Alan Lopez was doing well, stable from a cardiac perspective advised he can fall up in 1 year.  The patient is now pending procedure as outlined above. Since his last visit, he has been doing well cardiac wise, but did have PNA over the summer while he was in [place]  had to be hospitalized and ultimately followed up with local pulmonology.  He is doing well, mentions he was mowing his grass yesterday and typically works outside in his yard for 3 to 4 hours/day. He denies chest pain, palpitations, dyspnea, pnd, orthopnea, n, v, dizziness, syncope, edema,  weight gain, or early satiety.      Past Medical History    Past Medical History:  Diagnosis Date   Blindness of left eye 04/12/2015   Bradycardia 01/07/2022   Beta-blocker DC'd due to bradycardia.   HTN (hypertension)    Hypertrophy (benign) of prostate    Multinodular goiter 04/12/2015   Non-insulin  dependent type 2 diabetes mellitus (HCC)    Status post non-ST elevation myocardial infarction (NSTEMI)    CATH   Tobacco abuse    Type II diabetes mellitus, uncontrolled 04/12/2015   Past Surgical History:  Procedure Laterality Date   CORONARY ANGIOPLASTY WITH STENT PLACEMENT      Allergies  Allergies  Allergen Reactions   Atorvastatin  Other (See Comments)   Codeine Other (See Comments)    Patient doesn't like how it makes him feel, spaced out, not together  Doesn't like the way it makes him feel    Home Medications    Prior to Admission medications   Medication Sig Start Date End Date Taking? Authorizing Provider  acetaminophen  (TYLENOL ) 500 MG tablet Take 500 mg by mouth every 6 (six) hours as needed for mild pain or moderate pain.    [provider]  alfuzosin  (UROXATRAL ) 10 MG 24 hr tablet TAKE 1 TABLET BY MOUTH DAILY WITH BREAKFAST 09/14/24   McKenzie, Belvie CROME, MD  alfuzosin  (UROXATRAL ) 10 MG 24 hr tablet Take 1 tablet (10 mg total) by mouth daily with breakfast. 09/14/24   McKenzie, Belvie CROME, MD  aspirin EC 81 MG tablet Take 81 mg by  mouth daily.    [provider]  cetirizine (ZYRTEC) 10 MG tablet Take 10 mg by mouth daily.    [provider]  cholecalciferol (VITAMIN D) 1000 UNITS tablet Take 1,000 Units by mouth daily.    [provider]  dapsone 100 MG tablet Take 100 mg by mouth daily. 09/15/23   [provider]  diclofenac  (VOLTAREN ) 75 MG EC tablet Take 1 tablet (75 mg total) by mouth 2 (two) times daily. 07/01/24   Magdalen Pasco RAMAN, DPM  empagliflozin  (JARDIANCE ) 25 MG TABS tablet TAKE 1 TABLET BY MOUTH DAILY 10/11/24    Thapa, Sudan, MD  finasteride  (PROSCAR ) 5 MG tablet TAKE 1 TABLET BY MOUTH DAILY 07/21/24   McKenzie, Belvie CROME, MD  fluticasone  (FLONASE ) 50 MCG/ACT nasal spray Place 2 sprays into both nostrils daily. 12/22/23   Leath-Warren, Etta PARAS, NP  ibuprofen  (ADVIL ) 600 MG tablet Take 1 tablet (600 mg total) by mouth every 6 (six) hours as needed. 03/10/24   Reddick, Ethel B, NP  Insulin  Disposable Pump (V-GO 20) 20 UNIT/24HR KIT 1 kit by Does not apply route daily. USE AS DIRECT TO INJECT UP TO 20 UNITS AT 6:00AM DAILY 04/08/24   Thapa, Sudan, MD  Insulin  Pen Needle 32G X 4 MM MISC Use Three per day to inject insulin  08/09/19   [place], MD  insulin  regular (NOVOLIN R,HUMULIN R ) 100 units/mL injection Inject into the skin 3 (three) times daily before meals. USE INSULIN  TO FILL UP v-GO PUMP, use 3-5 units    [provider]  losartan  (COZAAR ) 25 MG tablet Take 1 tablet (25 mg total) by mouth daily. 06/15/24   Jeffrie Oneil BROCKS, MD  Multiple Vitamin (MULTIVITAMIN WITH MINERALS) TABS tablet Take 1 tablet by mouth daily.    [provider]  pioglitazone  (ACTOS ) 15 MG tablet Take 1 tablet (15 mg total) by mouth daily. 09/28/24   Thapa, Sudan, MD  rosuvastatin  (CRESTOR ) 10 MG tablet Take 1 tablet (10 mg total) by mouth at bedtime. 01/07/22   Jeffrie Oneil BROCKS, MD    Physical Exam    Vital Signs:  Alan Lopez does not have vital signs available for review today.  Given telephonic nature of communication, physical exam is limited. AAOx3. NAD. Normal affect.  Speech and respirations are unlabored.  Accessory Clinical Findings    None  Assessment & Plan    1.  Preoperative Cardiovascular Risk Assessment: According to the Revised Cardiac Risk Index (RCRI), his Perioperative Risk of Major Cardiac Event is (%): 0.9 His Functional Capacity in METs is: 6.61 according to the Duke Activity Status Index (DASI). Therefore, based on ACC/AHA guidelines, patient would be at acceptable risk for the  planned procedure without further cardiovascular testing. I will route this recommendation to the requesting party via Epic fax function.   The patient was advised that if he develops new symptoms prior to surgery to contact our office to arrange for a follow-up visit, and he verbalized understanding.  Per office protocol, if patient is without any new symptoms or concerns at the time of their virtual visit, he may hold ASA for 7 days prior to procedure. Please resume ASA as soon as possible postprocedure, at the discretion of the surgeon.    Time:   Today, I have spent 10 minutes with the patient with telehealth technology discussing medical history, symptoms, and management plan.     Alan BROCKS Hoover, NP  10/15/2024, 7:32 AM

## 2024-11-24 ENCOUNTER — Encounter: Payer: Self-pay | Admitting: Pulmonary Disease

## 2024-11-24 ENCOUNTER — Ambulatory Visit: Admitting: Pulmonary Disease

## 2024-11-24 VITALS — BP 146/87 | HR 63 | Temp 97.9°F | Ht 72.0 in | Wt 212.0 lb

## 2024-11-24 DIAGNOSIS — F17210 Nicotine dependence, cigarettes, uncomplicated: Secondary | ICD-10-CM | POA: Diagnosis not present

## 2024-11-24 DIAGNOSIS — J8410 Pulmonary fibrosis, unspecified: Secondary | ICD-10-CM

## 2024-11-24 DIAGNOSIS — J849 Interstitial pulmonary disease, unspecified: Secondary | ICD-10-CM

## 2024-11-24 DIAGNOSIS — L920 Granuloma annulare: Secondary | ICD-10-CM

## 2024-11-24 NOTE — Patient Instructions (Signed)
°  VISIT SUMMARY: Today, we discussed your lung scarring and reviewed your current symptoms and history. You are not experiencing any significant respiratory issues at this time.  YOUR PLAN: PULMONARY FIBROSIS: You have scarring in the upper lobes of both lungs, likely from past inflammation or exposure. There is no evidence of COPD, and you do not currently need treatment as long as your condition remains stable. -We will order a follow-up CT scan in August or September 2026 to monitor your lung scarring. -Please report any changes in your respiratory symptoms.

## 2024-11-24 NOTE — Progress Notes (Signed)
 "              Alan Lopez    [id number]    October 14, 1946  Primary Care Physician:Jones, Debby CROME, MD  Referring Physician: Joshua Debby CROME, MD [address] [place],  [place] [address]  Chief complaint: Follow-up for interstitial lung disease  HPI: 78 y.o. 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  Discussed the use of AI scribe software for clinical note transcription with the patient, who gave verbal consent to proceed.  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 He was referred by Dr. Joshua for evaluation of emphysema.  He was recently hospitalized in [place]  in May 2025 for right upper lobe pneumonia and received IV antibiotics, including ceftriaxone and azithromycin . Upon discharge, he was prescribed Augmentin  and azithromycin . During his hospital stay, he was started on oxygen therapy and was discharged on 1-2 liters of oxygen. At home, his oxygen saturation levels range from 86 to 89% without supplemental oxygen.  Noted to have ILD on follow-up scanning which is being monitored.  He has a history of coronary artery disease with a stent placed after a myocardial infarction in 2017 and diabetes managed with an insulin  pump. He quit smoking last month after a 30-year history of smoking half a pack per day. He has not used inhalers before, although he was given a rescue inhaler recently which he has not yet used. He has been on prednisone  in the past for flu-related chest congestion.  He has a history of granuloma annulare.  For which he has been  treated with Dapsone for about a year. A dermatologist has tried several treatments including salves.  No current use of inhalers. Oxygen levels drop to mid-80s during physical activity, especially in hot weather.  Interim history Alan Lopez is a 78 year old male who presents for follow-up of lung scarring.  Pulmonary symptoms and imaging findings - No current dyspnea - No recent flu or cold symptoms - Earlier this year, right upper lobe pneumonia - CT scan reportedly showed scarring in the upper lungs bilaterally - Owns a rescue inhaler but has not needed to use it  Exertional symptoms - Brief shortness of breath when standing quickly - Lightheadedness when turning quickly - No recent strenuous activity  Upper respiratory symptoms - Morning congestion and pain - No other medication changes aside from stopping Dapsone  Environmental exposures and smoking history - Smoking history - Wears a mask when burning leaves  Relevant pulmonary history: Pets:He has no pets 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 Exposures: No mold, hot tub, Jacuzzi.  No feather pillows or comforters No h/o chemo/XRT/amiodarone/macrodantin/MTX  No exposure to asbestos, silica or other organic allergens  Smoking history: Over 40-year history of tobacco use, recently quit September 2025 Travel history: From [place] .He recently visited [place]  to see his sister-in-law, who is in poor health, and stayed longer than intended due to his hospitalization. Relevant family history:Family history includes a great uncle with asthma and a daughter with asthma. His wife, who was a COPD patient, died of ovarian cancer.    Outpatient Encounter Medications as of 11/24/2024  Medication Sig  acetaminophen  (TYLENOL ) 500 MG tablet Take 500 mg by mouth every 6 (six) hours as needed for mild pain or moderate pain.   alfuzosin  (UROXATRAL ) 10 MG 24 hr tablet TAKE 1 TABLET BY MOUTH  DAILY WITH BREAKFAST   aspirin EC 81 MG tablet Take 81 mg by mouth daily.   cetirizine (ZYRTEC) 10 MG tablet Take 10 mg by mouth daily.   cholecalciferol (VITAMIN D) 1000 UNITS tablet Take 1,000 Units by mouth daily.   diclofenac  (VOLTAREN ) 75 MG EC tablet Take 1 tablet (75 mg total) by mouth 2 (two) times daily.   empagliflozin  (JARDIANCE ) 25 MG TABS tablet TAKE 1 TABLET BY MOUTH DAILY   finasteride  (PROSCAR ) 5 MG tablet TAKE 1 TABLET BY MOUTH DAILY   ibuprofen  (ADVIL ) 600 MG tablet Take 1 tablet (600 mg total) by mouth every 6 (six) hours as needed.   Insulin  Disposable Pump (V-GO 20) 20 UNIT/24HR KIT 1 kit by Does not apply route daily. USE AS DIRECT TO INJECT UP TO 20 UNITS AT 6:00AM DAILY   Insulin  Pen Needle 32G X 4 MM MISC Use Three per day to inject insulin    insulin  regular (NOVOLIN R,HUMULIN R ) 100 units/mL injection Inject into the skin 3 (three) times daily before meals. USE INSULIN  TO FILL UP v-GO PUMP, use 3-5 units   losartan  (COZAAR ) 25 MG tablet Take 1 tablet (25 mg total) by mouth daily.   Multiple Vitamin (MULTIVITAMIN WITH MINERALS) TABS tablet Take 1 tablet by mouth daily.   pioglitazone  (ACTOS ) 15 MG tablet Take 1 tablet (15 mg total) by mouth daily.   rosuvastatin  (CRESTOR ) 10 MG tablet Take 1 tablet (10 mg total) by mouth at bedtime.   alfuzosin  (UROXATRAL ) 10 MG 24 hr tablet Take 1 tablet (10 mg total) by mouth daily with breakfast.   dapsone 100 MG tablet Take 100 mg by mouth daily.   fluticasone  (FLONASE ) 50 MCG/ACT nasal spray Place 2 sprays into both nostrils daily. (Patient not taking: Reported on 11/24/2024)   No facility-administered encounter medications on file as of 11/24/2024.   Vitals:   11/24/24 0949  BP: (!) 146/80  Pulse: 63  Temp: 97.9 F (36.6 C)  Height: 6' (1.829 m)  Weight: 212 lb (96.2 kg)  SpO2: 93%  TempSrc: Oral  BMI (Calculated): 28.75     Physical Exam  GEN: No acute distress CV: Regular rate and rhythm no murmurs LUNGS: Clear  to auscultation bilaterally normal respiratory effort SKIN JOINTS: Warm and dry no rash    Data Reviewed: Imaging: Chest x-ray 10/28/2023-hyperinflation, mild parenchymal scarring.     CTA [facility] 05/03/2024 IMPRESSION:  1. No evidence of pulmonary embolus.  2.  Patchy right upper lobe airspace disease that is likely  inflammatory or infectious.   CT chest 07/16/2024-emphysema, bilateral upper lobe nonspecific scarring, traction bronchiectasis.  Alternate I reviewed the images personally.  PFTs: 08/19/2024 FVC 3.67 [86%], FEV1 2.68 [88%], F/F73, TLC 5.62 [77%], DLCO 14.53 [57%] Mild restriction, moderate diffusion defect  Labs: CTD serologies 08/25/2024-negative Assessment & Plan Pulmonary fibrosis Scarring in the upper lobes of both lungs, likely post-inflammatory, smoking-related or due to past exposure. Scarring is not solely due to previous pneumonia which was in the right upper lobe. No evidence of COPD on lung function tests. No current respiratory symptoms requiring intervention. No need for treatment as long as condition remains stable.  No exposures.  CTD serologies are negative - Will order follow-up CT scan in August or September 2026 to monitor lung scarring. -  Advised to report any changes in respiratory symptoms.  Tobacco use disorder Over 40-year smoking history, recently reduced to 4-5 cigarettes per day and quit 7 days ago. Smoking may contribute to lung scarring, particularly in the upper lobes.  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 Previously treated with Dapsone, currently not taking it. Dermatologist advised to resume if symptoms flare.  No obvious link to ILD. - Monitor for flare-ups of skin disease and resume Dapsone if necessary  Recommendations: Smoking cessation Follow-up CT  Lonna Coder MD [facility] 11/24/2024, 10:00 AM  CC: Joshua Debby CROME, MD    "

## 2024-12-16 ENCOUNTER — Other Ambulatory Visit

## 2024-12-16 ENCOUNTER — Other Ambulatory Visit: Payer: Self-pay | Admitting: Endocrinology

## 2024-12-16 ENCOUNTER — Other Ambulatory Visit: Payer: Self-pay

## 2024-12-16 DIAGNOSIS — Z794 Long term (current) use of insulin: Secondary | ICD-10-CM

## 2024-12-16 LAB — BASIC METABOLIC PANEL WITH GFR
BUN: 18 mg/dL (ref 7–25)
CO2: 23 mmol/L (ref 20–32)
Calcium: 8.3 mg/dL — ABNORMAL LOW (ref 8.6–10.3)
Chloride: 106 mmol/L (ref 98–110)
Creat: 1 mg/dL (ref 0.70–1.28)
Glucose, Bld: 194 mg/dL — ABNORMAL HIGH (ref 65–99)
Potassium: 4.1 mmol/L (ref 3.5–5.3)
Sodium: 138 mmol/L (ref 135–146)
eGFR: 77 mL/min/1.73m2

## 2024-12-16 LAB — LIPID PANEL
Cholesterol: 157 mg/dL
HDL: 82 mg/dL
LDL Cholesterol (Calc): 61 mg/dL
Non-HDL Cholesterol (Calc): 75 mg/dL
Total CHOL/HDL Ratio: 1.9 (calc)
Triglycerides: 65 mg/dL

## 2024-12-16 LAB — HEMOGLOBIN A1C
Hgb A1c MFr Bld: 7 % — ABNORMAL HIGH
Mean Plasma Glucose: 154 mg/dL
eAG (mmol/L): 8.5 mmol/L

## 2024-12-17 ENCOUNTER — Ambulatory Visit: Payer: Self-pay | Admitting: Endocrinology

## 2024-12-21 ENCOUNTER — Encounter: Payer: Self-pay | Admitting: Endocrinology

## 2024-12-21 ENCOUNTER — Ambulatory Visit (INDEPENDENT_AMBULATORY_CARE_PROVIDER_SITE_OTHER): Admitting: Endocrinology

## 2024-12-21 VITALS — BP 112/62 | HR 69 | Resp 16 | Ht 72.0 in | Wt 211.2 lb

## 2024-12-21 DIAGNOSIS — E118 Type 2 diabetes mellitus with unspecified complications: Secondary | ICD-10-CM

## 2024-12-21 DIAGNOSIS — Z794 Long term (current) use of insulin: Secondary | ICD-10-CM

## 2024-12-21 NOTE — Progress Notes (Signed)
 "  Outpatient Endocrinology Note Lizett Chowning, MD  12/21/2024  Patient's Name: Alan Lopez    DOB: 03-22-1946    MRN: [id number]                                                    REASON OF VISIT: Follow up of type 2 diabetes mellitus  PCP: Joshua Debby CROME, MD  HISTORY OF PRESENT ILLNESS:   Alan Lopez is a 79 y.o. old male with past medical history listed below, is here for follow up for type 2 diabetes mellitus.   Pertinent Diabetes History: Patient was diagnosed with type 2 diabetes mellitus in 1999.  He was started on V-Go pump in July 2018 because of inconsistent control and poor compliance with mealtime insulin .  Chronic Diabetes Complications : Retinopathy: no. Last ophthalmology exam was done on annually, following with ophthalmology regularly. 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  Nephropathy: no, on ACE/ARB /losartan . Peripheral neuropathy: yes numbness and tingling of the sole. Coronary artery disease: yes Stroke: no  Relevant comorbidities and cardiovascular risk factors: Obesity: no Body mass index is 28.64 kg/m.  Hypertension: Yes  Hyperlipidemia : Yes, on statin   Current / Home Diabetic regimen includes:  Actos  15 mg daily.  Jardiance  25 mg daily. Current INSULIN  regimen: V-go pump SETTINGS: Basal rate = 20 units BOLUSES = 6-8 units at breakfast, 6-8 units at lunch and  6-8 units at dinner   INSULIN  type: Regular insulin   Prior diabetic medications: Glipizide , metformin  in the past.  Diarrhea with metformin .  Glycemic data:    [employer]ia Contour glucometer, not able to download, reviewed glucose data directly from the meter, mild hyperglycemia noted : 159,169, 161,168,155, 142, 238, 147,149, 144, 113, 112.  CGM/Dexcom was discussed in the past.  Patient reports that he had adhesive problem in the abdomen.  He declined to reuse.  Hypoglycemia: Patient has no hypoglycemic episodes. Patient has hypoglycemia awareness.  Factors modifying  glucose control: 1.  Diabetic diet assessment: 3 meals a day.  He tried to limit high carb meals.  2.  Staying active or exercising:   3.  Medication compliance: compliant all of the time.  THYROID : Previously was found to have a 2 cm nodule on his left thyroid  on his exam but ultrasound in 04/2015 showed multinodular goiter with mostly small nodules; the largest nodule on the left side of 1.4 cm has the appearance of a pseudo- nodule   Thyroid  levels have been normal consistently.  Interval history  Glucometer data as reviewed above.  Hemoglobin A1c 7%.  Diabetes regimen as reviewed and noted above.  Denies numbness and ting of the feet.  No vision problem.  No other complaints today.  Reports he has diabetic eye exam appointment end of this month.  REVIEW OF SYSTEMS As per history of present illness.   PAST MEDICAL HISTORY: Past Medical History:  Diagnosis Date   Blindness of left eye 04/12/2015   Bradycardia 01/07/2022   Beta-blocker DC'd due to bradycardia.   HTN (hypertension)    Hypertrophy (benign) of prostate    Multinodular goiter 04/12/2015   Non-insulin  dependent type 2 diabetes mellitus (HCC)    Status post non-ST elevation myocardial infarction (NSTEMI)    CATH   Tobacco abuse    Type II diabetes mellitus, uncontrolled 04/12/2015    PAST  SURGICAL HISTORY: Past Surgical History:  Procedure Laterality Date   CORONARY ANGIOPLASTY WITH STENT PLACEMENT      ALLERGIES: Allergies  Allergen Reactions   Atorvastatin  Other (See Comments)   Codeine Other (See Comments)    Patient doesn't like how it makes him feel, spaced out, not together  Doesn't like the way it makes him feel    FAMILY HISTORY:  Family History  Problem Relation Age of Onset   Diabetes Mother    Diabetes Brother    Cancer Brother    Heart disease Neg Hx     SOCIAL HISTORY: Social History   Socioeconomic History   Marital status: Widowed    Spouse name: Not on file   Number of children: 3    Years of education: Not on file   Highest education level: Not on file  Occupational History   Occupation: retired  Tobacco Use   Smoking status: Light Smoker    Current packs/day: 0.50    Average packs/day: 0.5 packs/day for 40.0 years (20.0 ttl pk-yrs)    Types: Cigarettes    Passive exposure: Past   Smokeless tobacco: Never   Tobacco comments:    smokes on social occasions 4-5 a day 11/24/2024  Vaping Use   Vaping status: Never Used  Substance and Sexual Activity   Alcohol use: Yes    Alcohol/week: 10.0 standard drinks of alcohol    Types: 10 Cans of beer per week    Comment: socially   Drug use: No   Sexual activity: Never  Other Topics Concern   Not on file  Social History Narrative   Not on file   Social Drivers of Health   Tobacco Use: High Risk (12/21/2024)   Patient History    Smoking Tobacco Use: Light Smoker    Smokeless Tobacco Use: Never    Passive Exposure: Past  Financial Resource Strain: Low Risk (03/29/2024)   Overall Financial Resource Strain (CARDIA)    Difficulty of Paying Living Expenses: Not hard at all  Food Insecurity: Low Risk (05/04/2024)   Received from [facility]   [facility] Food Security    Within the past 12 months, the food you bought just didn't last and you didn't have money to get more.: 3    Within the past 12 months, you worried that your food would run out before you got money to buy more.: 3  Transportation Needs: Not At Risk (05/04/2024)   Received from [facility]   [facility] Transportation Needs    In the past 12 months, has lack of reliable transportation kept you from medical appointments, meetings, work or from getting things needed for daily living?: No  Physical Activity: Insufficiently Active (03/29/2024)   Exercise Vital Sign    Days of Exercise per Week: 3 days    Minutes of Exercise per Session: 30 min  Stress: No Stress Concern Present (03/29/2024)   [employer] of Occupational Health - Occupational Stress Questionnaire     Feeling of Stress : Not at all  Social Connections: Socially Isolated (03/29/2024)   Social Connection and Isolation Panel    Frequency of Communication with Friends and Family: More than three times a week    Frequency of Social Gatherings with Friends and Family: Once a week    Attends Religious Services: Never    Database Administrator or Organizations: No    Attends Banker Meetings: Never    Marital Status: Widowed  Depression (PHQ2-9): Low Risk (03/29/2024)   Depression (  PHQ2-9)    PHQ-2 Score: 1  Alcohol Screen: Low Risk (03/29/2024)   Alcohol Screen    Last Alcohol Screening Score (AUDIT): 1  Housing: Unknown (03/29/2024)   Housing Stability Vital Sign    Unable to Pay for Housing in the Last Year: No    Number of Times Moved in the Last Year: Not on file    Homeless in the Last Year: No  Utilities: Not At Risk (05/04/2024)   Received from [facility]   [facility] Utilities    In the past 12 months has the electric, gas, oil, or water company threatened to shut off services in your home?: No  Health Literacy: Adequate Health Literacy (03/29/2024)   B1300 Health Literacy    Frequency of need for help with medical instructions: Never    MEDICATIONS:  Current Outpatient Medications  Medication Sig Dispense Refill   acetaminophen  (TYLENOL ) 500 MG tablet Take 500 mg by mouth every 6 (six) hours as needed for mild pain or moderate pain.     alfuzosin  (UROXATRAL ) 10 MG 24 hr tablet TAKE 1 TABLET BY MOUTH DAILY WITH BREAKFAST 90 tablet 3   aspirin EC 81 MG tablet Take 81 mg by mouth daily.     cetirizine (ZYRTEC) 10 MG tablet Take 10 mg by mouth daily.     cholecalciferol (VITAMIN D) 1000 UNITS tablet Take 1,000 Units by mouth daily.     diclofenac  (VOLTAREN ) 75 MG EC tablet Take 1 tablet (75 mg total) by mouth 2 (two) times daily. 50 tablet 2   empagliflozin  (JARDIANCE ) 25 MG TABS tablet TAKE 1 TABLET BY MOUTH DAILY 90 tablet 3   finasteride  (PROSCAR ) 5 MG tablet TAKE 1  TABLET BY MOUTH DAILY 90 tablet 3   fluticasone  (FLONASE ) 50 MCG/ACT nasal spray Place 2 sprays into both nostrils daily. 16 g 0   ibuprofen  (ADVIL ) 600 MG tablet Take 1 tablet (600 mg total) by mouth every 6 (six) hours as needed. 30 tablet 0   Insulin  Disposable Pump (V-GO 20) 20 UNIT/24HR KIT 1 kit by Does not apply route daily. USE AS DIRECT TO INJECT UP TO 20 UNITS AT 6:00AM DAILY 30 kit 11   Insulin  Pen Needle 32G X 4 MM MISC Use Three per day to inject insulin  100 each 1   insulin  regular (NOVOLIN R,HUMULIN R ) 100 units/mL injection Inject into the skin 3 (three) times daily before meals. USE INSULIN  TO FILL UP v-GO PUMP, use 3-5 units     losartan  (COZAAR ) 25 MG tablet Take 1 tablet (25 mg total) by mouth daily. 90 tablet 2   Multiple Vitamin (MULTIVITAMIN WITH MINERALS) TABS tablet Take 1 tablet by mouth daily.     pioglitazone  (ACTOS ) 15 MG tablet Take 1 tablet (15 mg total) by mouth daily. 90 tablet 0   rosuvastatin  (CRESTOR ) 10 MG tablet Take 1 tablet (10 mg total) by mouth at bedtime. 90 tablet 3   No current facility-administered medications for this visit.    PHYSICAL EXAM: Vitals:   12/21/24 1004  BP: 112/62  Pulse: 69  Resp: 16  SpO2: 93%  Weight: 211 lb 3.2 oz (95.8 kg)  Height: 6' (1.829 m)    Body mass index is 28.64 kg/m.  Wt Readings from Last 3 Encounters:  12/21/24 211 lb 3.2 oz (95.8 kg)  11/24/24 212 lb (96.2 kg)  08/25/24 211 lb (95.7 kg)    General: Well developed, well nourished male in no apparent distress.  HEENT: AT/[place], no external lesions.  Eyes: Conjunctiva clear and no icterus. Neck: Neck supple  Lungs: Respirations not labored Neurologic: Alert, oriented, normal speech Extremities / Skin: Dry.   Psychiatric: Does not appear depressed or anxious  Diabetic Foot Exam - Simple   Simple Foot Form Diabetic Foot exam was performed with the following findings: Yes 12/21/2024 10:22 AM  Visual Inspection No deformities, no ulcerations, no other  skin breakdown bilaterally: Yes Sensation Testing Intact to touch and monofilament testing bilaterally: Yes Pulse Check Posterior Tibialis and Dorsalis pulse intact bilaterally: Yes Comments    LABS Reviewed Lab Results  Component Value Date   HGBA1C 7.0 (H) 12/16/2024   HGBA1C 5.2 08/17/2024   HGBA1C 5.1 04/05/2024   Lab Results  Component Value Date   FRUCTOSAMINE 225 05/01/2020   FRUCTOSAMINE 294 (H) 06/30/2017   FRUCTOSAMINE 255 09/09/2016   Lab Results  Component Value Date   CHOL 157 12/16/2024   HDL 82 12/16/2024   LDLCALC 61 12/16/2024   TRIG 65 12/16/2024   CHOLHDL 1.9 12/16/2024   Lab Results  Component Value Date   MICRALBCREAT 4 08/17/2024    Lab Results  Component Value Date   CREATININE 1.00 12/16/2024   Lab Results  Component Value Date   GFR 83.45 10/01/2023    ASSESSMENT / PLAN  1. Controlled type 2 diabetes mellitus with complication, with long-term current use of insulin  (HCC)    Diabetes Mellitus type 2, complicated by CAD/peripheral neuropathy. - Diabetic status / severity: Controlled  Lab Results  Component Value Date   HGBA1C 7.0 (H) 12/16/2024    - Hemoglobin A1c goal : <7%  Discussed about continuous glucose monitoring, patient wants to think about it he declined at this time.  - Medications: See below.  No change.  Actos  15 mg daily. Jardiance  25 mg daily. Current INSULIN  regimen: V-go pump SETTINGS: Basal rate = 20 units. Continue BOLUSES = 6-8 units at breakfast, 6-8 units at lunch and  6-8 units at dinner   INSULIN  type: Regular insulin   - Home glucose testing: Before meals and at bedtime. Encouraged to use CGM. - Discussed/ Gave Hypoglycemia treatment plan.  # Consult : not required at this time.   # Annual urine for microalbuminuria/ creatinine ratio, no microalbuminuria currently, continue ACE/ARB /losartan /Jardiance . Last  Lab Results  Component Value Date   MICRALBCREAT 4 08/17/2024     # Foot check  nightly / neuropathy.  # Annual dilated diabetic eye exams.   - Diet: Make healthy diabetic food choices - Life style / activity / exercise: Discussed.  2. Blood pressure  -  BP Readings from Last 1 Encounters:  12/21/24 112/62    - Control is in target.  Acceptable blood pressure. - No change in current plans.  3. Lipid status / Hyperlipidemia - Last  Lab Results  Component Value Date   LDLCALC 61 12/16/2024   - Continue rosuvastatin  20 mg daily.  Managed by PCP.  Diagnoses and all orders for this visit:  Controlled type 2 diabetes mellitus with complication, with long-term current use of insulin  (HCC)    DISPOSITION Follow up in clinic in 4 months suggested.   All questions answered and patient verbalized understanding of the plan.  Aeriel Boulay, MD [facility] Endocrinology [facility] [address] [place], [address] [place], [place] [address] Phone # [phone]  At least part of this note was generated using voice recognition software. Inadvertent word errors may have occurred, which were not recognized during the proofreading process. "

## 2024-12-25 ENCOUNTER — Other Ambulatory Visit: Payer: Self-pay | Admitting: Endocrinology

## 2024-12-25 DIAGNOSIS — E1165 Type 2 diabetes mellitus with hyperglycemia: Secondary | ICD-10-CM

## 2025-04-19 ENCOUNTER — Ambulatory Visit: Admitting: Endocrinology

## 2025-07-01 ENCOUNTER — Other Ambulatory Visit

## 2025-07-08 ENCOUNTER — Ambulatory Visit: Admitting: Urology
